# Patient Record
Sex: Male | Born: 1945 | Race: Black or African American | Hispanic: No | Marital: Married | Smoking: Former smoker
Health system: Southern US, Community
[De-identification: ages and names within clinical notes are randomized; demographics above are authoritative.]

## PROBLEM LIST (undated history)

## (undated) DIAGNOSIS — I10 Essential (primary) hypertension: Secondary | ICD-10-CM

## (undated) DIAGNOSIS — E785 Hyperlipidemia, unspecified: Secondary | ICD-10-CM

## (undated) DIAGNOSIS — Z9889 Other specified postprocedural states: Secondary | ICD-10-CM

## (undated) DIAGNOSIS — Z9989 Dependence on other enabling machines and devices: Secondary | ICD-10-CM

## (undated) DIAGNOSIS — I4891 Unspecified atrial fibrillation: Secondary | ICD-10-CM

## (undated) DIAGNOSIS — J939 Pneumothorax, unspecified: Secondary | ICD-10-CM

## (undated) DIAGNOSIS — I472 Ventricular tachycardia, unspecified: Secondary | ICD-10-CM

## (undated) DIAGNOSIS — R413 Other amnesia: Secondary | ICD-10-CM

## (undated) DIAGNOSIS — I509 Heart failure, unspecified: Secondary | ICD-10-CM

## (undated) DIAGNOSIS — F32A Depression, unspecified: Secondary | ICD-10-CM

## (undated) DIAGNOSIS — R569 Unspecified convulsions: Secondary | ICD-10-CM

## (undated) DIAGNOSIS — E119 Type 2 diabetes mellitus without complications: Secondary | ICD-10-CM

## (undated) DIAGNOSIS — Z95810 Presence of automatic (implantable) cardiac defibrillator: Secondary | ICD-10-CM

## (undated) DIAGNOSIS — J189 Pneumonia, unspecified organism: Secondary | ICD-10-CM

## (undated) DIAGNOSIS — K219 Gastro-esophageal reflux disease without esophagitis: Secondary | ICD-10-CM

## (undated) DIAGNOSIS — M1990 Unspecified osteoarthritis, unspecified site: Secondary | ICD-10-CM

## (undated) DIAGNOSIS — J449 Chronic obstructive pulmonary disease, unspecified: Secondary | ICD-10-CM

## (undated) DIAGNOSIS — I639 Cerebral infarction, unspecified: Secondary | ICD-10-CM

## (undated) DIAGNOSIS — Z8719 Personal history of other diseases of the digestive system: Secondary | ICD-10-CM

## (undated) DIAGNOSIS — F329 Major depressive disorder, single episode, unspecified: Secondary | ICD-10-CM

## (undated) DIAGNOSIS — I519 Heart disease, unspecified: Secondary | ICD-10-CM

## (undated) DIAGNOSIS — I219 Acute myocardial infarction, unspecified: Secondary | ICD-10-CM

## (undated) DIAGNOSIS — G4733 Obstructive sleep apnea (adult) (pediatric): Secondary | ICD-10-CM

## (undated) DIAGNOSIS — I255 Ischemic cardiomyopathy: Secondary | ICD-10-CM

## (undated) DIAGNOSIS — N183 Chronic kidney disease, stage 3 (moderate): Secondary | ICD-10-CM

## (undated) HISTORY — DX: Chronic obstructive pulmonary disease, unspecified: J44.9

## (undated) HISTORY — DX: Chronic kidney disease, stage 3 (moderate): N18.3

## (undated) HISTORY — DX: Ischemic cardiomyopathy: I25.5

## (undated) HISTORY — DX: Unspecified atrial fibrillation: I48.91

## (undated) HISTORY — PX: OTHER SURGICAL HISTORY: SHX169

## (undated) HISTORY — DX: Heart failure, unspecified: I50.9

## (undated) HISTORY — DX: Gastro-esophageal reflux disease without esophagitis: K21.9

## (undated) HISTORY — DX: Ventricular tachycardia: I47.2

## (undated) HISTORY — DX: Hyperlipidemia, unspecified: E78.5

## (undated) HISTORY — DX: Heart disease, unspecified: I51.9

## (undated) HISTORY — DX: Type 2 diabetes mellitus without complications: E11.9

## (undated) HISTORY — DX: Acute myocardial infarction, unspecified: I21.9

## (undated) HISTORY — DX: Ventricular tachycardia, unspecified: I47.20

## (undated) HISTORY — DX: Other specified postprocedural states: Z98.890

## (undated) HISTORY — DX: Major depressive disorder, single episode, unspecified: F32.9

## (undated) HISTORY — DX: Depression, unspecified: F32.A

## (undated) HISTORY — DX: Other amnesia: R41.3

## (undated) HISTORY — DX: Personal history of other diseases of the digestive system: Z87.19

## (undated) HISTORY — DX: Essential (primary) hypertension: I10

## (undated) HISTORY — PX: ABDOMINAL HERNIA REPAIR: SHX539

## (undated) HISTORY — DX: Pneumothorax, unspecified: J93.9

## (undated) HISTORY — PX: HERNIA REPAIR: SHX51

## (undated) HISTORY — DX: Unspecified osteoarthritis, unspecified site: M19.90

## (undated) HISTORY — DX: Cerebral infarction, unspecified: I63.9

## (undated) HISTORY — DX: Unspecified convulsions: R56.9

## (undated) HISTORY — PX: HAND TENDON SURGERY: SHX663

---

## 1984-05-22 DIAGNOSIS — I219 Acute myocardial infarction, unspecified: Secondary | ICD-10-CM

## 1984-05-22 HISTORY — DX: Acute myocardial infarction, unspecified: I21.9

## 1985-01-03 HISTORY — PX: CORONARY ANGIOPLASTY: SHX604

## 1994-07-11 ENCOUNTER — Encounter: Payer: Self-pay | Admitting: Internal Medicine

## 2001-06-21 ENCOUNTER — Encounter: Admission: RE | Admit: 2001-06-21 | Discharge: 2001-09-19 | Payer: Self-pay | Admitting: Anesthesiology

## 2002-12-05 ENCOUNTER — Encounter: Payer: Self-pay | Admitting: Internal Medicine

## 2003-03-09 ENCOUNTER — Ambulatory Visit (HOSPITAL_COMMUNITY): Admission: RE | Admit: 2003-03-09 | Discharge: 2003-03-09 | Payer: Self-pay | Admitting: Gastroenterology

## 2003-03-09 ENCOUNTER — Encounter: Payer: Self-pay | Admitting: Gastroenterology

## 2003-05-20 ENCOUNTER — Ambulatory Visit (HOSPITAL_COMMUNITY): Admission: RE | Admit: 2003-05-20 | Discharge: 2003-05-20 | Payer: Self-pay | Admitting: Gastroenterology

## 2004-02-11 ENCOUNTER — Inpatient Hospital Stay (HOSPITAL_COMMUNITY): Admission: RE | Admit: 2004-02-11 | Discharge: 2004-02-12 | Payer: Self-pay | Admitting: Internal Medicine

## 2004-02-12 ENCOUNTER — Encounter: Payer: Self-pay | Admitting: Internal Medicine

## 2004-03-20 ENCOUNTER — Encounter: Payer: Self-pay | Admitting: Critical Care Medicine

## 2004-04-06 ENCOUNTER — Ambulatory Visit: Payer: Self-pay

## 2004-04-08 ENCOUNTER — Ambulatory Visit: Payer: Self-pay | Admitting: Critical Care Medicine

## 2004-06-01 ENCOUNTER — Ambulatory Visit: Payer: Self-pay | Admitting: Internal Medicine

## 2004-10-24 ENCOUNTER — Ambulatory Visit: Payer: Self-pay | Admitting: Internal Medicine

## 2004-10-26 ENCOUNTER — Encounter: Payer: Self-pay | Admitting: Internal Medicine

## 2005-08-24 ENCOUNTER — Encounter: Payer: Self-pay | Admitting: Pulmonary Disease

## 2006-04-05 DIAGNOSIS — I10 Essential (primary) hypertension: Secondary | ICD-10-CM

## 2006-04-05 DIAGNOSIS — I69998 Other sequelae following unspecified cerebrovascular disease: Secondary | ICD-10-CM | POA: Insufficient documentation

## 2006-04-05 DIAGNOSIS — I2510 Atherosclerotic heart disease of native coronary artery without angina pectoris: Secondary | ICD-10-CM

## 2006-04-05 DIAGNOSIS — R519 Headache, unspecified: Secondary | ICD-10-CM | POA: Insufficient documentation

## 2006-04-05 DIAGNOSIS — G4733 Obstructive sleep apnea (adult) (pediatric): Secondary | ICD-10-CM | POA: Insufficient documentation

## 2006-04-05 DIAGNOSIS — E782 Mixed hyperlipidemia: Secondary | ICD-10-CM | POA: Insufficient documentation

## 2006-05-22 DIAGNOSIS — J189 Pneumonia, unspecified organism: Secondary | ICD-10-CM

## 2006-05-22 HISTORY — DX: Pneumonia, unspecified organism: J18.9

## 2006-10-19 ENCOUNTER — Encounter: Payer: Self-pay | Admitting: Internal Medicine

## 2006-12-11 ENCOUNTER — Encounter: Payer: Self-pay | Admitting: Internal Medicine

## 2006-12-19 ENCOUNTER — Encounter: Payer: Self-pay | Admitting: Internal Medicine

## 2007-01-24 ENCOUNTER — Ambulatory Visit: Payer: Self-pay | Admitting: Pulmonary Disease

## 2007-04-12 ENCOUNTER — Ambulatory Visit: Payer: Self-pay | Admitting: Pulmonary Disease

## 2007-04-12 DIAGNOSIS — I5022 Chronic systolic (congestive) heart failure: Secondary | ICD-10-CM | POA: Insufficient documentation

## 2007-05-23 DIAGNOSIS — I639 Cerebral infarction, unspecified: Secondary | ICD-10-CM

## 2007-05-23 HISTORY — DX: Cerebral infarction, unspecified: I63.9

## 2007-07-16 ENCOUNTER — Encounter: Payer: Self-pay | Admitting: Internal Medicine

## 2007-07-19 ENCOUNTER — Ambulatory Visit: Payer: Self-pay | Admitting: *Deleted

## 2007-07-19 ENCOUNTER — Ambulatory Visit: Payer: Self-pay | Admitting: Internal Medicine

## 2007-07-19 ENCOUNTER — Inpatient Hospital Stay (HOSPITAL_COMMUNITY): Admission: EM | Admit: 2007-07-19 | Discharge: 2007-07-21 | Payer: Self-pay | Admitting: Emergency Medicine

## 2007-07-29 ENCOUNTER — Ambulatory Visit: Payer: Self-pay | Admitting: *Deleted

## 2007-07-29 DIAGNOSIS — H9190 Unspecified hearing loss, unspecified ear: Secondary | ICD-10-CM | POA: Insufficient documentation

## 2007-07-30 ENCOUNTER — Encounter: Payer: Self-pay | Admitting: Internal Medicine

## 2007-09-06 ENCOUNTER — Ambulatory Visit: Payer: Self-pay | Admitting: Internal Medicine

## 2007-09-06 ENCOUNTER — Ambulatory Visit: Payer: Self-pay | Admitting: *Deleted

## 2007-09-06 ENCOUNTER — Inpatient Hospital Stay (HOSPITAL_COMMUNITY): Admission: AD | Admit: 2007-09-06 | Discharge: 2007-09-09 | Payer: Self-pay | Admitting: *Deleted

## 2007-09-06 DIAGNOSIS — I6350 Cerebral infarction due to unspecified occlusion or stenosis of unspecified cerebral artery: Secondary | ICD-10-CM | POA: Insufficient documentation

## 2007-09-13 ENCOUNTER — Encounter: Payer: Self-pay | Admitting: Critical Care Medicine

## 2007-09-16 ENCOUNTER — Telehealth (INDEPENDENT_AMBULATORY_CARE_PROVIDER_SITE_OTHER): Payer: Self-pay | Admitting: Internal Medicine

## 2007-09-18 ENCOUNTER — Ambulatory Visit: Payer: Self-pay | Admitting: Internal Medicine

## 2007-09-26 ENCOUNTER — Encounter (INDEPENDENT_AMBULATORY_CARE_PROVIDER_SITE_OTHER): Payer: Self-pay | Admitting: *Deleted

## 2007-10-02 ENCOUNTER — Telehealth: Payer: Self-pay | Admitting: *Deleted

## 2007-10-03 ENCOUNTER — Encounter: Payer: Self-pay | Admitting: Internal Medicine

## 2007-10-04 ENCOUNTER — Encounter: Payer: Self-pay | Admitting: Infectious Disease

## 2007-10-07 ENCOUNTER — Encounter: Payer: Self-pay | Admitting: Internal Medicine

## 2007-10-08 ENCOUNTER — Encounter: Payer: Self-pay | Admitting: Internal Medicine

## 2007-10-09 ENCOUNTER — Ambulatory Visit (HOSPITAL_COMMUNITY): Admission: RE | Admit: 2007-10-09 | Discharge: 2007-10-09 | Payer: Self-pay | Admitting: Internal Medicine

## 2007-10-21 ENCOUNTER — Telehealth: Payer: Self-pay | Admitting: Internal Medicine

## 2007-10-23 ENCOUNTER — Ambulatory Visit: Payer: Self-pay | Admitting: *Deleted

## 2007-10-23 ENCOUNTER — Ambulatory Visit (HOSPITAL_COMMUNITY): Admission: RE | Admit: 2007-10-23 | Discharge: 2007-10-23 | Payer: Self-pay | Admitting: *Deleted

## 2007-10-23 DIAGNOSIS — I4891 Unspecified atrial fibrillation: Secondary | ICD-10-CM | POA: Insufficient documentation

## 2007-10-23 DIAGNOSIS — M25519 Pain in unspecified shoulder: Secondary | ICD-10-CM | POA: Insufficient documentation

## 2007-10-28 ENCOUNTER — Encounter: Payer: Self-pay | Admitting: Internal Medicine

## 2007-11-06 ENCOUNTER — Encounter: Payer: Self-pay | Admitting: Pulmonary Disease

## 2007-11-06 ENCOUNTER — Encounter: Payer: Self-pay | Admitting: Internal Medicine

## 2007-11-07 ENCOUNTER — Encounter: Payer: Self-pay | Admitting: Internal Medicine

## 2007-11-08 ENCOUNTER — Telehealth: Payer: Self-pay | Admitting: Internal Medicine

## 2007-11-12 ENCOUNTER — Encounter: Payer: Self-pay | Admitting: Internal Medicine

## 2007-11-14 ENCOUNTER — Telehealth: Payer: Self-pay | Admitting: Internal Medicine

## 2007-11-14 ENCOUNTER — Encounter (INDEPENDENT_AMBULATORY_CARE_PROVIDER_SITE_OTHER): Payer: Self-pay | Admitting: Internal Medicine

## 2007-11-15 ENCOUNTER — Encounter: Payer: Self-pay | Admitting: Internal Medicine

## 2007-11-20 ENCOUNTER — Encounter: Payer: Self-pay | Admitting: Internal Medicine

## 2007-11-25 ENCOUNTER — Encounter: Payer: Self-pay | Admitting: Internal Medicine

## 2007-12-06 ENCOUNTER — Encounter: Payer: Self-pay | Admitting: Internal Medicine

## 2007-12-06 ENCOUNTER — Ambulatory Visit: Payer: Self-pay | Admitting: *Deleted

## 2007-12-09 LAB — CONVERTED CEMR LAB
BUN: 21 mg/dL (ref 6–23)
CO2: 26 meq/L (ref 19–32)
Calcium: 10.3 mg/dL (ref 8.4–10.5)
Chloride: 105 meq/L (ref 96–112)
Glucose, Bld: 75 mg/dL (ref 70–99)

## 2007-12-11 ENCOUNTER — Encounter: Payer: Self-pay | Admitting: Internal Medicine

## 2007-12-26 ENCOUNTER — Encounter: Payer: Self-pay | Admitting: Internal Medicine

## 2008-01-02 ENCOUNTER — Encounter: Payer: Self-pay | Admitting: Internal Medicine

## 2008-01-15 ENCOUNTER — Encounter: Payer: Self-pay | Admitting: Internal Medicine

## 2008-02-07 ENCOUNTER — Telehealth: Payer: Self-pay | Admitting: Internal Medicine

## 2008-02-24 ENCOUNTER — Ambulatory Visit: Payer: Self-pay | Admitting: Internal Medicine

## 2008-04-09 ENCOUNTER — Encounter: Payer: Self-pay | Admitting: Internal Medicine

## 2008-04-27 ENCOUNTER — Encounter: Payer: Self-pay | Admitting: Internal Medicine

## 2008-05-07 ENCOUNTER — Encounter: Payer: Self-pay | Admitting: Internal Medicine

## 2008-07-01 ENCOUNTER — Ambulatory Visit (HOSPITAL_COMMUNITY): Admission: RE | Admit: 2008-07-01 | Discharge: 2008-07-01 | Payer: Self-pay | Admitting: Internal Medicine

## 2008-07-01 ENCOUNTER — Encounter (INDEPENDENT_AMBULATORY_CARE_PROVIDER_SITE_OTHER): Payer: Self-pay | Admitting: Internal Medicine

## 2008-07-01 ENCOUNTER — Encounter: Payer: Self-pay | Admitting: Internal Medicine

## 2008-07-01 ENCOUNTER — Ambulatory Visit: Payer: Self-pay | Admitting: Internal Medicine

## 2008-07-01 LAB — CONVERTED CEMR LAB
Alkaline Phosphatase: 52 units/L (ref 39–117)
Basophils Absolute: 0.1 10*3/uL (ref 0.0–0.1)
Calcium: 9.8 mg/dL (ref 8.4–10.5)
Creatinine, Ser: 1.58 mg/dL — ABNORMAL HIGH (ref 0.40–1.50)
Eosinophils Absolute: 0.6 10*3/uL (ref 0.0–0.7)
Eosinophils Relative: 8 % — ABNORMAL HIGH (ref 0–5)
Glucose, Bld: 94 mg/dL (ref 70–99)
HCT: 44 % (ref 39.0–52.0)
Hemoglobin: 15.4 g/dL (ref 13.0–17.0)
Lymphocytes Relative: 21 % (ref 12–46)
MCV: 88.8 fL (ref 78.0–100.0)
Monocytes Relative: 13 % — ABNORMAL HIGH (ref 3–12)
Potassium: 3.7 meq/L (ref 3.5–5.3)
RBC: 4.96 M/uL (ref 4.22–5.81)
RDW: 14.3 % (ref 11.5–15.5)
Total Bilirubin: 0.8 mg/dL (ref 0.3–1.2)
Total Protein: 7.6 g/dL (ref 6.0–8.3)
WBC: 7.4 10*3/uL (ref 4.0–10.5)

## 2008-07-20 ENCOUNTER — Telehealth: Payer: Self-pay | Admitting: *Deleted

## 2008-07-23 ENCOUNTER — Encounter: Payer: Self-pay | Admitting: Internal Medicine

## 2008-07-29 ENCOUNTER — Encounter: Payer: Self-pay | Admitting: Internal Medicine

## 2008-07-30 ENCOUNTER — Encounter: Payer: Self-pay | Admitting: Internal Medicine

## 2008-08-03 ENCOUNTER — Telehealth: Payer: Self-pay | Admitting: Internal Medicine

## 2008-08-04 ENCOUNTER — Telehealth: Payer: Self-pay | Admitting: Gastroenterology

## 2008-08-04 ENCOUNTER — Telehealth: Payer: Self-pay | Admitting: Internal Medicine

## 2008-08-05 ENCOUNTER — Telehealth: Payer: Self-pay | Admitting: Internal Medicine

## 2008-08-24 ENCOUNTER — Encounter: Payer: Self-pay | Admitting: Internal Medicine

## 2008-08-27 ENCOUNTER — Ambulatory Visit: Payer: Self-pay | Admitting: Internal Medicine

## 2008-08-27 ENCOUNTER — Encounter: Payer: Self-pay | Admitting: Licensed Clinical Social Worker

## 2008-09-01 ENCOUNTER — Encounter: Payer: Self-pay | Admitting: Internal Medicine

## 2008-09-18 ENCOUNTER — Encounter: Payer: Self-pay | Admitting: Licensed Clinical Social Worker

## 2008-09-18 ENCOUNTER — Ambulatory Visit: Payer: Self-pay | Admitting: Infectious Disease

## 2008-09-29 ENCOUNTER — Encounter: Payer: Self-pay | Admitting: Internal Medicine

## 2008-10-08 ENCOUNTER — Telehealth: Payer: Self-pay | Admitting: Licensed Clinical Social Worker

## 2008-10-12 ENCOUNTER — Encounter: Payer: Self-pay | Admitting: Internal Medicine

## 2008-10-26 ENCOUNTER — Encounter: Payer: Self-pay | Admitting: Internal Medicine

## 2008-12-29 ENCOUNTER — Ambulatory Visit: Payer: Self-pay | Admitting: Internal Medicine

## 2008-12-29 LAB — CONVERTED CEMR LAB

## 2008-12-30 ENCOUNTER — Encounter: Payer: Self-pay | Admitting: Internal Medicine

## 2008-12-31 ENCOUNTER — Encounter: Payer: Self-pay | Admitting: Internal Medicine

## 2009-01-04 ENCOUNTER — Encounter: Payer: Self-pay | Admitting: Internal Medicine

## 2009-01-05 ENCOUNTER — Encounter: Payer: Self-pay | Admitting: Internal Medicine

## 2009-01-06 ENCOUNTER — Encounter: Payer: Self-pay | Admitting: Internal Medicine

## 2009-01-19 ENCOUNTER — Encounter: Payer: Self-pay | Admitting: Internal Medicine

## 2009-02-17 ENCOUNTER — Encounter: Payer: Self-pay | Admitting: Internal Medicine

## 2009-02-18 ENCOUNTER — Ambulatory Visit (HOSPITAL_COMMUNITY): Admission: RE | Admit: 2009-02-18 | Discharge: 2009-02-18 | Payer: Self-pay | Admitting: Internal Medicine

## 2009-02-18 ENCOUNTER — Ambulatory Visit: Payer: Self-pay | Admitting: Internal Medicine

## 2009-02-18 LAB — CONVERTED CEMR LAB
Albumin: 4.6 g/dL (ref 3.5–5.2)
Basophils Relative: 0 % (ref 0–1)
Chloride: 105 meq/L (ref 96–112)
Eosinophils Absolute: 0.3 10*3/uL (ref 0.0–0.7)
Hemoglobin: 14.7 g/dL (ref 13.0–17.0)
Lymphs Abs: 3.3 10*3/uL (ref 0.7–4.0)
Platelets: 300 10*3/uL (ref 150–400)
Potassium: 4 meq/L (ref 3.5–5.3)
TSH: 2.65 microintl units/mL (ref 0.350–4.5)
Total Bilirubin: 0.6 mg/dL (ref 0.3–1.2)
Total Protein: 7.5 g/dL (ref 6.0–8.3)
Vitamin B-12: 457 pg/mL (ref 211–911)
WBC: 7.6 10*3/uL (ref 4.0–10.5)

## 2009-02-22 ENCOUNTER — Ambulatory Visit: Payer: Self-pay | Admitting: Internal Medicine

## 2009-02-22 LAB — CONVERTED CEMR LAB: INR: 2.3

## 2009-03-01 ENCOUNTER — Encounter: Payer: Self-pay | Admitting: Internal Medicine

## 2009-03-02 ENCOUNTER — Ambulatory Visit: Payer: Self-pay | Admitting: Internal Medicine

## 2009-03-09 ENCOUNTER — Telehealth: Payer: Self-pay | Admitting: Internal Medicine

## 2009-03-09 ENCOUNTER — Ambulatory Visit: Payer: Self-pay | Admitting: Internal Medicine

## 2009-03-11 ENCOUNTER — Ambulatory Visit: Payer: Self-pay | Admitting: Pulmonary Disease

## 2009-03-11 ENCOUNTER — Ambulatory Visit (HOSPITAL_COMMUNITY): Admission: RE | Admit: 2009-03-11 | Discharge: 2009-03-11 | Payer: Self-pay | Admitting: Internal Medicine

## 2009-03-11 ENCOUNTER — Ambulatory Visit: Payer: Self-pay | Admitting: Internal Medicine

## 2009-03-12 ENCOUNTER — Encounter: Payer: Self-pay | Admitting: Internal Medicine

## 2009-03-12 LAB — CONVERTED CEMR LAB: Sed Rate: 2 mm/hr (ref 0–16)

## 2009-03-16 ENCOUNTER — Telehealth: Payer: Self-pay | Admitting: Pulmonary Disease

## 2009-03-22 ENCOUNTER — Telehealth: Payer: Self-pay | Admitting: Internal Medicine

## 2009-03-22 ENCOUNTER — Encounter: Admission: RE | Admit: 2009-03-22 | Discharge: 2009-04-20 | Payer: Self-pay | Admitting: Internal Medicine

## 2009-03-23 ENCOUNTER — Encounter: Payer: Self-pay | Admitting: Internal Medicine

## 2009-03-23 ENCOUNTER — Telehealth (INDEPENDENT_AMBULATORY_CARE_PROVIDER_SITE_OTHER): Payer: Self-pay | Admitting: *Deleted

## 2009-03-24 ENCOUNTER — Encounter: Payer: Self-pay | Admitting: Internal Medicine

## 2009-03-26 ENCOUNTER — Encounter
Admission: RE | Admit: 2009-03-26 | Discharge: 2009-05-13 | Payer: Self-pay | Admitting: Physical Medicine & Rehabilitation

## 2009-03-29 ENCOUNTER — Encounter: Payer: Self-pay | Admitting: Internal Medicine

## 2009-03-30 ENCOUNTER — Telehealth: Payer: Self-pay | Admitting: Internal Medicine

## 2009-03-30 ENCOUNTER — Encounter (INDEPENDENT_AMBULATORY_CARE_PROVIDER_SITE_OTHER): Payer: Self-pay | Admitting: Internal Medicine

## 2009-03-31 ENCOUNTER — Encounter: Payer: Self-pay | Admitting: Internal Medicine

## 2009-04-02 ENCOUNTER — Telehealth (INDEPENDENT_AMBULATORY_CARE_PROVIDER_SITE_OTHER): Payer: Self-pay | Admitting: *Deleted

## 2009-04-02 ENCOUNTER — Telehealth: Payer: Self-pay | Admitting: Pulmonary Disease

## 2009-04-13 ENCOUNTER — Encounter: Payer: Self-pay | Admitting: Internal Medicine

## 2009-04-20 ENCOUNTER — Encounter: Payer: Self-pay | Admitting: Internal Medicine

## 2009-04-20 ENCOUNTER — Telehealth (INDEPENDENT_AMBULATORY_CARE_PROVIDER_SITE_OTHER): Payer: Self-pay | Admitting: *Deleted

## 2009-04-22 ENCOUNTER — Ambulatory Visit (HOSPITAL_COMMUNITY): Admission: RE | Admit: 2009-04-22 | Discharge: 2009-04-22 | Payer: Self-pay | Admitting: Internal Medicine

## 2009-04-22 ENCOUNTER — Ambulatory Visit: Payer: Self-pay | Admitting: Internal Medicine

## 2009-04-22 ENCOUNTER — Encounter: Payer: Self-pay | Admitting: Internal Medicine

## 2009-04-22 LAB — CONVERTED CEMR LAB
Basophils Absolute: 0 10*3/uL (ref 0.0–0.1)
Basophils Relative: 1 % (ref 0–1)
Eosinophils Absolute: 0.4 10*3/uL (ref 0.0–0.7)
Eosinophils Relative: 5 % (ref 0–5)
HCT: 41.6 % (ref 39.0–52.0)
Hemoglobin: 14.1 g/dL (ref 13.0–17.0)
Lymphocytes Relative: 31 % (ref 12–46)
Lymphs Abs: 2.4 10*3/uL (ref 0.7–4.0)
MCHC: 33.9 g/dL (ref 30.0–36.0)
MCV: 90.3 fL (ref >=78.0)
Monocytes Absolute: 0.8 10*3/uL (ref 0.1–1.0)
Monocytes Relative: 10 % (ref 3–12)
Neutro Abs: 4.1 10*3/uL (ref 1.7–7.7)
Neutrophils Relative %: 54 % (ref 43–77)
Platelets: 287 10*3/uL (ref 150–400)
RBC: 4.61 M/uL (ref 4.22–5.81)
RDW: 13.7 % (ref 11.5–15.5)
Total CK: 281 units/L — ABNORMAL HIGH (ref 7–232)
Troponin I: 0.07 ng/mL — ABNORMAL HIGH (ref <0.06)
WBC: 7.7 10*3/uL (ref 4.0–10.5)

## 2009-05-22 HISTORY — PX: OTHER SURGICAL HISTORY: SHX169

## 2009-06-01 ENCOUNTER — Ambulatory Visit: Payer: Self-pay | Admitting: Internal Medicine

## 2009-06-14 ENCOUNTER — Ambulatory Visit: Payer: Self-pay | Admitting: Internal Medicine

## 2009-06-14 ENCOUNTER — Ambulatory Visit: Payer: Self-pay | Admitting: Cardiology

## 2009-06-14 ENCOUNTER — Telehealth: Payer: Self-pay | Admitting: Internal Medicine

## 2009-06-14 LAB — CONVERTED CEMR LAB: POC INR: 2.1

## 2009-06-16 ENCOUNTER — Telehealth: Payer: Self-pay | Admitting: *Deleted

## 2009-06-21 ENCOUNTER — Telehealth (INDEPENDENT_AMBULATORY_CARE_PROVIDER_SITE_OTHER): Payer: Self-pay

## 2009-06-22 ENCOUNTER — Ambulatory Visit: Payer: Self-pay

## 2009-06-22 ENCOUNTER — Ambulatory Visit: Payer: Self-pay | Admitting: Internal Medicine

## 2009-06-22 ENCOUNTER — Encounter: Payer: Self-pay | Admitting: Internal Medicine

## 2009-06-22 ENCOUNTER — Encounter (HOSPITAL_COMMUNITY): Admission: RE | Admit: 2009-06-22 | Discharge: 2009-08-25 | Payer: Self-pay | Admitting: Internal Medicine

## 2009-06-25 ENCOUNTER — Ambulatory Visit: Payer: Self-pay | Admitting: Internal Medicine

## 2009-07-05 ENCOUNTER — Ambulatory Visit: Payer: Self-pay | Admitting: Cardiology

## 2009-07-05 ENCOUNTER — Encounter (INDEPENDENT_AMBULATORY_CARE_PROVIDER_SITE_OTHER): Payer: Self-pay | Admitting: Cardiology

## 2009-07-05 LAB — CONVERTED CEMR LAB: POC INR: 2.4

## 2009-07-26 ENCOUNTER — Ambulatory Visit: Payer: Self-pay | Admitting: Internal Medicine

## 2009-07-26 LAB — CONVERTED CEMR LAB: POC INR: 1.9

## 2009-08-23 ENCOUNTER — Ambulatory Visit: Payer: Self-pay | Admitting: Internal Medicine

## 2009-08-23 LAB — CONVERTED CEMR LAB: POC INR: 1.8

## 2009-08-26 ENCOUNTER — Telehealth: Payer: Self-pay | Admitting: *Deleted

## 2009-08-27 LAB — CONVERTED CEMR LAB
BUN: 19 mg/dL (ref 6–23)
CO2: 32 meq/L (ref 19–32)
GFR calc non Af Amer: 46.27 mL/min (ref >60)
Glucose, Bld: 99 mg/dL (ref 70–99)
Sodium: 140 meq/L (ref 135–145)

## 2009-09-06 ENCOUNTER — Ambulatory Visit: Payer: Self-pay | Admitting: Cardiology

## 2009-09-06 LAB — CONVERTED CEMR LAB: POC INR: 1.2

## 2009-09-16 ENCOUNTER — Ambulatory Visit: Payer: Self-pay | Admitting: Cardiology

## 2009-09-24 ENCOUNTER — Ambulatory Visit: Payer: Self-pay | Admitting: Internal Medicine

## 2009-09-24 LAB — CONVERTED CEMR LAB: POC INR: 3.2

## 2009-10-07 ENCOUNTER — Telehealth: Payer: Self-pay | Admitting: Internal Medicine

## 2009-10-19 ENCOUNTER — Telehealth (INDEPENDENT_AMBULATORY_CARE_PROVIDER_SITE_OTHER): Payer: Self-pay | Admitting: *Deleted

## 2009-10-22 ENCOUNTER — Ambulatory Visit: Payer: Self-pay | Admitting: Cardiovascular Disease

## 2009-11-02 ENCOUNTER — Telehealth: Payer: Self-pay | Admitting: Internal Medicine

## 2009-11-03 ENCOUNTER — Telehealth: Payer: Self-pay | Admitting: Internal Medicine

## 2009-11-15 ENCOUNTER — Ambulatory Visit: Payer: Self-pay | Admitting: Internal Medicine

## 2009-11-15 ENCOUNTER — Ambulatory Visit: Payer: Self-pay | Admitting: Cardiology

## 2009-11-17 LAB — CONVERTED CEMR LAB
Basophils Relative: 0.7 % (ref 0.0–3.0)
Lymphocytes Relative: 32.6 % (ref 12.0–46.0)
Monocytes Relative: 9.4 % (ref 3.0–12.0)
Neutro Abs: 3.4 10*3/uL (ref 1.4–7.7)
Platelets: 282 10*3/uL (ref 150.0–400.0)
RBC: 4.38 M/uL (ref 4.22–5.81)
WBC: 6.5 10*3/uL (ref 4.5–10.5)

## 2009-11-26 ENCOUNTER — Encounter: Payer: Self-pay | Admitting: Internal Medicine

## 2009-12-02 ENCOUNTER — Ambulatory Visit: Payer: Self-pay | Admitting: Internal Medicine

## 2009-12-08 ENCOUNTER — Encounter: Payer: Self-pay | Admitting: Internal Medicine

## 2009-12-09 ENCOUNTER — Ambulatory Visit: Payer: Self-pay | Admitting: Internal Medicine

## 2009-12-09 ENCOUNTER — Inpatient Hospital Stay (HOSPITAL_COMMUNITY): Admission: EM | Admit: 2009-12-09 | Discharge: 2009-12-13 | Payer: Self-pay | Admitting: Emergency Medicine

## 2009-12-13 ENCOUNTER — Telehealth (INDEPENDENT_AMBULATORY_CARE_PROVIDER_SITE_OTHER): Payer: Self-pay | Admitting: *Deleted

## 2009-12-21 ENCOUNTER — Telehealth: Payer: Self-pay | Admitting: Internal Medicine

## 2009-12-23 ENCOUNTER — Ambulatory Visit: Payer: Self-pay | Admitting: Internal Medicine

## 2009-12-23 DIAGNOSIS — E785 Hyperlipidemia, unspecified: Secondary | ICD-10-CM

## 2009-12-23 DIAGNOSIS — I472 Ventricular tachycardia, unspecified: Secondary | ICD-10-CM

## 2009-12-24 ENCOUNTER — Telehealth: Payer: Self-pay | Admitting: Internal Medicine

## 2009-12-24 LAB — CONVERTED CEMR LAB
ALT: 31 units/L (ref 0–53)
AST: 27 units/L (ref 0–37)
Albumin: 4.5 g/dL (ref 3.5–5.2)
Alkaline Phosphatase: 51 units/L (ref 39–117)
BUN: 11 mg/dL (ref 6–23)
CO2: 25 meq/L (ref 19–32)
Calcium: 10.1 mg/dL (ref 8.4–10.5)
Chloride: 103 meq/L (ref 96–112)
Cholesterol: 156 mg/dL (ref 0–200)
Creatinine, Ser: 1.73 mg/dL — ABNORMAL HIGH (ref 0.40–1.50)
Glucose, Bld: 117 mg/dL — ABNORMAL HIGH (ref 70–99)
HDL: 31 mg/dL — ABNORMAL LOW (ref >39)
LDL Cholesterol: 103 mg/dL — ABNORMAL HIGH (ref 0–99)
Potassium: 4.6 meq/L (ref 3.5–5.3)
Sodium: 138 meq/L (ref 135–145)
Total Bilirubin: 0.4 mg/dL (ref 0.3–1.2)
Total CHOL/HDL Ratio: 5
Total Protein: 7 g/dL (ref 6.0–8.3)
Triglycerides: 111 mg/dL (ref <150)
VLDL: 22 mg/dL (ref 0–40)

## 2009-12-28 ENCOUNTER — Ambulatory Visit: Payer: Self-pay | Admitting: Internal Medicine

## 2009-12-29 ENCOUNTER — Encounter: Payer: Self-pay | Admitting: Internal Medicine

## 2010-01-06 ENCOUNTER — Telehealth (INDEPENDENT_AMBULATORY_CARE_PROVIDER_SITE_OTHER): Payer: Self-pay | Admitting: *Deleted

## 2010-01-10 ENCOUNTER — Encounter (HOSPITAL_COMMUNITY): Admission: RE | Admit: 2010-01-10 | Discharge: 2010-02-09 | Payer: Self-pay | Admitting: Internal Medicine

## 2010-01-10 ENCOUNTER — Encounter: Payer: Self-pay | Admitting: Internal Medicine

## 2010-01-10 ENCOUNTER — Ambulatory Visit: Payer: Self-pay | Admitting: Internal Medicine

## 2010-01-10 ENCOUNTER — Ambulatory Visit: Payer: Self-pay

## 2010-01-10 LAB — CONVERTED CEMR LAB
ALT: 34 units/L (ref 0–53)
AST: 34 units/L (ref 0–37)
Alkaline Phosphatase: 53 units/L (ref 39–117)
TSH: 1.66 microintl units/mL (ref 0.35–5.50)
Total Bilirubin: 0.5 mg/dL (ref 0.3–1.2)
Total Protein: 7 g/dL (ref 6.0–8.3)

## 2010-01-12 ENCOUNTER — Encounter: Payer: Self-pay | Admitting: Internal Medicine

## 2010-01-21 ENCOUNTER — Telehealth (INDEPENDENT_AMBULATORY_CARE_PROVIDER_SITE_OTHER): Payer: Self-pay | Admitting: *Deleted

## 2010-02-07 ENCOUNTER — Telehealth: Payer: Self-pay | Admitting: *Deleted

## 2010-02-18 ENCOUNTER — Ambulatory Visit: Payer: Self-pay | Admitting: Internal Medicine

## 2010-03-01 ENCOUNTER — Telehealth: Payer: Self-pay | Admitting: Internal Medicine

## 2010-03-21 ENCOUNTER — Telehealth: Payer: Self-pay | Admitting: Internal Medicine

## 2010-04-27 ENCOUNTER — Encounter: Payer: Self-pay | Admitting: Internal Medicine

## 2010-04-27 ENCOUNTER — Ambulatory Visit: Payer: Self-pay | Admitting: Internal Medicine

## 2010-04-27 LAB — CONVERTED CEMR LAB
AST: 30 units/L (ref 0–37)
BUN: 14 mg/dL (ref 6–23)
Basophils Relative: 0.7 % (ref 0.0–3.0)
Calcium: 9.4 mg/dL (ref 8.4–10.5)
Eosinophils Relative: 5.1 % — ABNORMAL HIGH (ref 0.0–5.0)
Free T4: 0.92 ng/dL (ref 0.60–1.60)
GFR calc non Af Amer: 47.03 mL/min — ABNORMAL LOW (ref >60.00)
Hemoglobin: 13.7 g/dL (ref 13.0–17.0)
Lymphocytes Relative: 35.8 % (ref 12.0–46.0)
MCHC: 34.1 g/dL (ref 30.0–36.0)
Monocytes Relative: 11.8 % (ref 3.0–12.0)
Neutro Abs: 3.3 10*3/uL (ref 1.4–7.7)
Neutrophils Relative %: 46.6 % (ref 43.0–77.0)
Potassium: 4.3 meq/L (ref 3.5–5.1)
RBC: 4.39 M/uL (ref 4.22–5.81)
Sodium: 143 meq/L (ref 135–145)
Total Bilirubin: 0.3 mg/dL (ref 0.3–1.2)
WBC: 7.1 10*3/uL (ref 4.5–10.5)

## 2010-05-26 ENCOUNTER — Telehealth: Payer: Self-pay | Admitting: Internal Medicine

## 2010-06-08 ENCOUNTER — Ambulatory Visit: Admission: RE | Admit: 2010-06-08 | Discharge: 2010-06-08 | Payer: Self-pay | Source: Home / Self Care

## 2010-06-14 ENCOUNTER — Encounter: Payer: Self-pay | Admitting: Gastroenterology

## 2010-06-15 ENCOUNTER — Ambulatory Visit
Admission: RE | Admit: 2010-06-15 | Discharge: 2010-06-15 | Payer: Self-pay | Source: Home / Self Care | Attending: Internal Medicine | Admitting: Internal Medicine

## 2010-06-15 LAB — CONVERTED CEMR LAB
OCCULT 2: NEGATIVE
OCCULT 3: NEGATIVE

## 2010-06-21 NOTE — Letter (Signed)
Summary: [facility] Office Note   [facility] Office Note   Imported By: Roderic Ovens 07/21/2009 14:37:08  _____________________________________________________________________  External Attachment:    Type:   Image     Comment:   External Document

## 2010-06-21 NOTE — Progress Notes (Signed)
Summary: Nuc. Pre-Procedure  Phone Note Outgoing Call Call back at [facility] Phone [phone]   Call placed by: Rea College, CMA,  June 21, 2009 3:30 PM Call placed to: Patient Summary of Call: Left message with information on Myoview Information Sheet (see scanned document for details).      Nuclear Med Background Indications for Stress Test: Evaluation for Ischemia, PTCA Patency        Nuclear Pre-Procedure Height (in): 69

## 2010-06-21 NOTE — Progress Notes (Signed)
Summary: CALLING REGARDING PRADAXA  Phone Note Call from Patient Call back at Home Phone [phone]   Caller: Patient Summary of Call: PT CALLING TO SEE IF OFFICE HAS GAVE AUTHORIZATION  FOR PT TO GET PRADAXA Initial call taken by: Judie Grieve,  December 21, 2009 3:32 PM  Follow-up for Phone Call        I have never recieved prior aurth. form.  wife aware and will call and have one faxed to me tomorrow Dennis Bast, RN, BSN  December 21, 2009 5:09 PM Jewel called and got a prior aurth for 12 months Dennis Bast, RN, BSN  December 23, 2009 1:36 PM
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
## 2010-06-21 NOTE — Progress Notes (Signed)
Summary: Medication  Phone Note Outgoing Call   Call placed by: Angelina Ok RN,  June 16, 2009 12:03 PM Call placed to: Patient Summary of Call: Call to pt at [phone], [phone] No answer unable to leave a message to inform hin that his prescription for Vicodin5/500 has been called to the pharmacy. Prescription for Vicodin 5/500mg  tablets # 60 with no refills was called to the [facility] on [address] per order of Dr. Sunnie Nielsen. Angelina Ok RN  June 16, 2009 12:06 PM  Initial call taken by: Angelina Ok RN,  June 16, 2009 12:05 PM

## 2010-06-21 NOTE — Letter (Signed)
Summary: [facility] Office Note  [facility] Office Note   Imported By: Roderic Ovens 07/21/2009 15:35:44  _____________________________________________________________________  External Attachment:    Type:   Image     Comment:   External Document

## 2010-06-21 NOTE — Medication Information (Signed)
Summary: rov/tm  Anticoagulant Therapy  Managed by: Eda Keys, PharmD Referring MD: Tommie Sams PCP: Hartley Barefoot MD Supervising MD: Tenny Craw MD, Gunnar Fusi Indication 1: Atrial fibrillation Indication 2: Encounter for therapeutic drug monitoring  V58.83 Lab Used: [facility] [employer] Site: [address] INR POC 3.2 INR RANGE 2.0-3.0  Dietary changes: no    Health status changes: no    Bleeding/hemorrhagic complications: no    Recent/future hospitalizations: no    Any changes in medication regimen? no    Recent/future dental: no  Any missed doses?: yes     Details: One dose this past tuesday  Is patient compliant with meds? yes       Allergies: 1)  ! Codeine  Anticoagulation Management History:      The patient is taking warfarin and comes in today for a routine follow up visit.  Positive risk factors for bleeding include history of CVA/TIA and presence of serious comorbidities.  Negative risk factors for bleeding include an age less than 51 years old.  The bleeding index is 'intermediate risk'.  Positive CHADS2 values include History of HTN and Prior Stroke/CVA/TIA.  Negative CHADS2 values include Age > 53 years old.  The start date was 05/22/2006.  His last INR was 2.3.  Anticoagulation responsible provider: Tenny Craw MD, Gunnar Fusi.  INR POC: 3.2.  Cuvette Lot#: [id number].  Exp: 10/2010.    Anticoagulation Management Assessment/Plan:      The patient's current anticoagulation dose is Coumadin 5 mg tabs: Take as directed., Warfarin sodium 2.5 mg tabs: UAD.  The target INR is 2.0-3.0.  The next INR is due 10/04/2009.  Anticoagulation instructions were given to patient.  Results were reviewed/authorized by Eda Keys, PharmD.  He was notified by Eda Keys, PharmD.         Prior Anticoagulation Instructions: INR 3.4 Skip today's dose then resume 2.5mg s everyday except 5mg s on Wednesdays and Fridays. Recheck in one week.   Current Anticoagulation  Instructions: INR 3.2  Start NEW schedule of 5 mg on Wednesday and 2.5 mg all other days.  Return to clinic in 10 days.

## 2010-06-21 NOTE — Medication Information (Signed)
Summary: rov/sak  Anticoagulant Therapy  Managed by: Shelby Dubin, PharmD, BCPS, CPP Referring MD: Tommie Sams PCP: Hartley Barefoot MD Supervising MD: Shirlee Latch MD, Tineshia Becraft Indication 1: Atrial fibrillation Indication 2: Encounter for therapeutic drug monitoring  V58.83 INR POC 2.4 INR RANGE 2.0-3.0  Dietary changes: no    Health status changes: no    Bleeding/hemorrhagic complications: no    Recent/future hospitalizations: no    Any changes in medication regimen? no    Recent/future dental: no  Any missed doses?: no       Is patient compliant with meds? yes       Allergies: 1)  ! Codeine  Anticoagulation Management History:      The patient is taking warfarin and comes in today for a routine follow up visit.  Positive risk factors for bleeding include history of CVA/TIA and presence of serious comorbidities.  Negative risk factors for bleeding include an age less than 50 years old.  The bleeding index is 'intermediate risk'.  Positive CHADS2 values include History of HTN and Prior Stroke/CVA/TIA.  Negative CHADS2 values include Age > 40 years old.  The start date was 05/22/2006.  His last INR was 2.3.  Anticoagulation responsible provider: Shirlee Latch MD, Bucky Grigg.  INR POC: 2.4.  Cuvette Lot#: [id number].  Exp: 08/2010.    Anticoagulation Management Assessment/Plan:      The patient's current anticoagulation dose is Coumadin 5 mg tabs: Take as directed., Warfarin sodium 2.5 mg tabs: UAD.  The target INR is 2.0-3.0.  The next INR is due 08/02/2009.  Anticoagulation instructions were given to patient.  Results were reviewed/authorized by Shelby Dubin, PharmD, BCPS, CPP.  He was notified by Shelby Dubin PharmD, BCPS, CPP.         Prior Anticoagulation Instructions: INR 2.1  Patient had been managed by [facility] in [place].  Continue same dose, 5 mg on Wednesday's and 2.5 mg on all other days of the week.  Current Anticoagulation Instructions: INR 2.4  Continue 2.5 mg daily  except 5 mg Wednesday.  Recheck in 4 weeks.

## 2010-06-21 NOTE — Progress Notes (Signed)
Summary: talk to nurse  Phone Note Call from Patient Call back at Home Phone [phone]   Caller: Spouse Summary of Call: pt wife would like to talk to nurse about changing from coumadin to pradaxa.  Initial call taken by: Edman Circle,  Oct 07, 2009 2:28 PM  Follow-up for Phone Call        spoke w/pts wife they saw comercial for pradaxa and would like to change pt on coumadin for a-fib w/no valve problems will need to make sure it is ok w/Dr Caelan Atchley, and we will give her a call back to let her know, in meantime she is going to check w/medicare on cost and if prior auth is needed Meredith Staggers, RN  Oct 07, 2009 3:01 PM  Follow-up by: Hillis Range, MD,  Oct 11, 2009 2:30 PM  Additional Follow-up for Phone Call Additional follow up Details #1::        Pt would be a reasonable candidate for pradaxa given his paroxysmal nonvalvular afib.  His creatinine clearance is >40.  I would recommend 150mg  two times a day once his INR is less than 2.  I agree that he should check with insurance regarding pricing.  If he decides to start praxaxa, he will need to have his INR drawn after holding coumadin for 48 hours to see when it is safe to start pradaxa. Additional Follow-up by: Hillis Range, MD,  Oct 11, 2009 2:31 PM    Additional Follow-up for Phone Call Additional follow up Details #2::    wife aware and will let me know if they want to change Dennis Bast, RN, BSN  Oct 12, 2009 2:08 PM

## 2010-06-21 NOTE — Medication Information (Signed)
Summary: rov/ewj  Anticoagulant Therapy  Managed by: Bethena Midget, RN, BSN Referring MD: Tommie Sams PCP: Hartley Barefoot MD Supervising MD: Daleen Squibb MD, Maisie Fus Indication 1: Atrial fibrillation Indication 2: Encounter for therapeutic drug monitoring  V58.83 Lab Used: [facility] [place] Site: [address] INR POC 3.4 INR RANGE 2.0-3.0  Dietary changes: yes       Details: Ate less green veggies.   Health status changes: yes       Details: Over weekend had diarrhea and vomiting   Bleeding/hemorrhagic complications: no    Recent/future hospitalizations: yes       Details: HCTZ is on hold   Any changes in medication regimen? no    Recent/future dental: no  Any missed doses?: no       Is patient compliant with meds? yes       Allergies: 1)  ! Codeine  Anticoagulation Management History:      The patient is taking warfarin and comes in today for a routine follow up visit.  Positive risk factors for bleeding include history of CVA/TIA and presence of serious comorbidities.  Negative risk factors for bleeding include an age less than 89 years old.  The bleeding index is 'intermediate risk'.  Positive CHADS2 values include History of HTN and Prior Stroke/CVA/TIA.  Negative CHADS2 values include Age > 80 years old.  The start date was 05/22/2006.  His last INR was 2.3.  Anticoagulation responsible provider: Daleen Squibb MD, Maisie Fus.  INR POC: 3.4.  Cuvette Lot#: [id number].  Exp: 10/2010.    Anticoagulation Management Assessment/Plan:      The patient's current anticoagulation dose is Coumadin 5 mg tabs: Take as directed., Warfarin sodium 2.5 mg tabs: UAD.  The target INR is 2.0-3.0.  The next INR is due 09/23/2009.  Anticoagulation instructions were given to patient.  Results were reviewed/authorized by Bethena Midget, RN, BSN.  He was notified by Bethena Midget, RN, BSN.         Prior Anticoagulation Instructions: INR 1.2  Take 7.5mg  today, then 5mg  tomorrow, then resume same dosage  2.5mg  daily except 5mg  on Mondays and Fridays.  Recheck in 10 days.    Current Anticoagulation Instructions: INR 3.4 Skip today's dose then resume 2.5mg s everyday except 5mg s on Wednesdays and Fridays. Recheck in one week.

## 2010-06-21 NOTE — Medication Information (Signed)
Summary: rov.mp  Anticoagulant Therapy  Managed by: Bethena Midget, RN, BSN Referring MD: Tommie Sams PCP: Hartley Barefoot MD Supervising MD: [name] MD, Sharlot Gowda Indication 1: Atrial fibrillation Indication 2: Encounter for therapeutic drug monitoring  V58.83 Lab Used: [facility] [place] Site: [address] INR POC 1.9 INR RANGE 2.0-3.0  Dietary changes: no    Health status changes: no    Bleeding/hemorrhagic complications: no    Recent/future hospitalizations: no    Any changes in medication regimen? no    Recent/future dental: no  Any missed doses?: yes     Details: missed last Wed and Thurs. dose  Is patient compliant with meds? yes       Current Medications (verified): 1)  Lipitor 80 Mg  Tabs (Atorvastatin Calcium) .... At Bedtime 2)  Nexium 40 Mg  Cpdr (Esomeprazole Magnesium) .... Once Daily 3)  Enalapril Maleate 20 Mg Tabs (Enalapril Maleate) .... Two Times A Day 4)  Metoprolol Succinate 50 Mg Xr24h-Tab (Metoprolol Succinate) .... Take 1 Tablet By Mouth Daily. 5)  Lipofen 150 Mg  Caps (Fenofibrate) .... One Tab By Mouth Daily 6)  Hydrochlorothiazide 25 Mg  Tabs (Hydrochlorothiazide) .Marland Kitchen.. 1 Tablet By Mouth Daily 7)  Hydroxyzine Hcl 50 Mg Tabs (Hydroxyzine Hcl) .... Take One Tablet Three Times A Day For Itching. 8)  Nitro-Dur 0.4 Mg/hr Pt24 (Nitroglycerin) .... Place One Tablet Under Your Tongue For Chest Pain.  Repeat in 5 Minutes If There Is No Effect.  Maximum of 3 Doses. Call Your Doctor If You Have Chest Pain. 9)  Coumadin 5 Mg Tabs (Warfarin Sodium) .... Take As Directed. 10)  Flexeril 5 Mg Tabs (Cyclobenzaprine Hcl) .... Take 1 By Mouth Three Times A Day. 11)  Voltaren 0.1 % Soln (Diclofenac Sodium) .... Apply Affeted Area Three Times A Day. 12)  Ranitidine Hcl 150 Mg Tabs (Ranitidine Hcl) .... Take One Tablet By Mouth Once Daily. 13)  Aspirin 81 Mg Tbec (Aspirin) .... Take One Tablet By Mouth Daily 14)  Hydrochlorothiazide 25 Mg Tabs  (Hydrochlorothiazide) .... Take One Tablet By Mouth Daily. 15)  Warfarin Sodium 2.5 Mg Tabs (Warfarin Sodium) .... Uad  Allergies: 1)  ! Codeine  Anticoagulation Management History:      The patient is taking warfarin and comes in today for a routine follow up visit.  Positive risk factors for bleeding include history of CVA/TIA and presence of serious comorbidities.  Negative risk factors for bleeding include an age less than 69 years old.  The bleeding index is 'intermediate risk'.  Positive CHADS2 values include History of HTN and Prior Stroke/CVA/TIA.  Negative CHADS2 values include Age > 92 years old.  The start date was 05/22/2006.  His last INR was 2.3.  Anticoagulation responsible provider: [name] MD, Sharlot Gowda.  INR POC: 1.9.  Cuvette Lot#: [id number].  Exp: 09/2010.    Anticoagulation Management Assessment/Plan:      The patient's current anticoagulation dose is Coumadin 5 mg tabs: Take as directed., Warfarin sodium 2.5 mg tabs: UAD.  The target INR is 2.0-3.0.  The next INR is due 08/23/2009.  Anticoagulation instructions were given to patient.  Results were reviewed/authorized by Bethena Midget, RN, BSN.  He was notified by Bethena Midget, RN, BSN.         Prior Anticoagulation Instructions: INR 2.4  Continue 2.5 mg daily except 5 mg Wednesday.  Recheck in 4 weeks.    Current Anticoagulation Instructions: INR 1.9 Today take 5mg s then continue 2.5mg s everyday except 5mg s on Wednesdays. Recheck in  4 weeks.

## 2010-06-21 NOTE — Letter (Signed)
Summary: [facility] Office Note   [facility] Office Note   Imported By: Roderic Ovens 07/21/2009 14:31:00  _____________________________________________________________________  External Attachment:    Type:   Image     Comment:   External Document

## 2010-06-21 NOTE — Assessment & Plan Note (Signed)
Summary: FLU/SB.  Nurse Visit   Allergies: 1)  ! Codeine 2)  ! * Protonix  Orders Added: 1)  Flu Vaccine 83yrs + MEDICARE PATIENTS [Q2039] 2)  Administration Flu vaccine - MCR [G0008] Flu Vaccine Consent Questions     Do you have a history of severe allergic reactions to this vaccine? no    Any prior history of allergic reactions to egg and/or gelatin? no    Do you have a sensitivity to the preservative Thimersol? no    Do you have a past history of Guillan-Barre Syndrome? no    Do you currently have an acute febrile illness? no    Have you ever had a severe reaction to latex? no    Vaccine information given and explained to patient? yes    Are you currently pregnant? no    Lot Number:[id number]   Exp Date:11/19/2010   Manufacturer: [employer]    Site Given  right Deltoid IMinistration Flu vaccine - MCR [G0008] .medflu
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
## 2010-06-21 NOTE — Medication Information (Signed)
Summary: new  Anticoagulant Therapy  Managed by: Rolland Porter, PharmD PCP: Hartley Barefoot MD Supervising MD: Antoine Poche MD, Fayrene Fearing Indication 1: Atrial fibrillation Indication 2: Encounter for therapeutic drug monitoring  V58.83 INR POC 2.1 INR RANGE 2.0-3.0  Dietary changes: no    Health status changes: no    Bleeding/hemorrhagic complications: no    Recent/future hospitalizations: no    Any changes in medication regimen? no    Recent/future dental: no  Any missed doses?: no       Is patient compliant with meds? yes       Current Medications (verified): 1)  Lipitor 80 Mg  Tabs (Atorvastatin Calcium) .... At Bedtime 2)  Nexium 40 Mg  Cpdr (Esomeprazole Magnesium) .... Once Daily 3)  Enalapril Maleate 20 Mg Tabs (Enalapril Maleate) .... Two Times A Day 4)  Metoprolol Succinate 50 Mg Xr24h-Tab (Metoprolol Succinate) .... Take 1 Tablet By Mouth Daily. 5)  Lipofen 150 Mg  Caps (Fenofibrate) .... One Tab By Mouth Daily 6)  Hydrochlorothiazide 25 Mg  Tabs (Hydrochlorothiazide) .Marland Kitchen.. 1 Tablet By Mouth Daily 7)  Hydroxyzine Hcl 50 Mg Tabs (Hydroxyzine Hcl) .... Take One Tablet Three Times A Day For Itching. 8)  Nitro-Dur 0.4 Mg/hr Pt24 (Nitroglycerin) .... Place One Tablet Under Your Tongue For Chest Pain.  Repeat in 5 Minutes If There Is No Effect.  Maximum of 3 Doses. Call Your Doctor If You Have Chest Pain. 9)  Vicodin 5-500 Mg Tabs (Hydrocodone-Acetaminophen) .... Take 1 Tablet By Mouth Every 6 Hour For Pain As Needed. 10)  Coumadin 5 Mg Tabs (Warfarin Sodium) .... Take As Directed. 11)  Flexeril 5 Mg Tabs (Cyclobenzaprine Hcl) .... Take 1 By Mouth Three Times A Day. 12)  Fenofibrate 160 Mg Tabs (Fenofibrate) .... Once Daily 13)  Voltaren 0.1 % Soln (Diclofenac Sodium) .... Apply Affeted Area Three Times A Day.  Allergies (verified): 1)  ! Codeine  Anticoagulation Management History:      The patient comes in today for his initial visit for anticoagulation therapy.  Positive  risk factors for bleeding include history of CVA/TIA and presence of serious comorbidities.  Negative risk factors for bleeding include an age less than 25 years old.  The bleeding index is 'intermediate risk'.  Positive CHADS2 values include History of HTN and Prior Stroke/CVA/TIA.  Negative CHADS2 values include Age > 34 years old.  The start date was 05/22/2006.  His last INR was 2.3.  Anticoagulation responsible provider: Antoine Poche MD, Fayrene Fearing.  INR POC: 2.1.  Cuvette Lot#: [id number].  Exp: 06/2010.    Anticoagulation Management Assessment/Plan:      The patient's current anticoagulation dose is Coumadin 5 mg tabs: Take as directed..  The next INR is due 07/05/2009.  Results were reviewed/authorized by Rolland Porter, PharmD.  He was notified by Rolland Porter.         Current Anticoagulation Instructions: INR 2.1  Patient had been managed by [facility] in [place].  Continue same dose, 5 mg on Wednesday's and 2.5 mg on all other days of the week.

## 2010-06-21 NOTE — Assessment & Plan Note (Signed)
Summary: ACUTE-OKAY TO ADD/KNEE PAIN/SWOLLEN/(REGALADO)/CFB   Vital Signs:  Patient profile:   65 year old male Height:      69 inches Weight:      191.4 pounds BMI:     28.37 Temp:     97.5 degrees F oral Pulse rate:   80 / minute BP sitting:   116 / 74  (right arm)  Vitals Entered By: Filomena Jungling NT II (June 25, 2009 10:53 AM) CC:  RIGHT KNEE PAIN X 64 WEEK, Depression Is Patient Diabetic? No Pain Assessment Patient in pain? yes     Location: knee Intensity: 9 Type: aching Nutritional Status BMI of 25 - 29 = overweight  Does patient need assistance? Functional Status Self care Ambulation Normal   Primary Care Provider:  Hartley Barefoot MD  CC:   RIGHT KNEE PAIN X 64 WEEK and Depression.  History of Present Illness: 65y/o man with multiple cardiac problems including A-Fib [facility], OSA. Depression and chronic elbow pain due to nerve impingement from arthritis comes to the clinic with pain full left knee  Started 1 week ago while he was trying to bend down, has been gradually getting worse, unable to bear weight now on the knee without support, have not had this kind of problem before, did not injure himself to have cased the pain, no other joint (except elbow) hurting, no fevers, chills, redness over the joint, h/o gout, or iv drug use. No rash on the body. Tried vicodin which did not help but alleve and volterin gel did releive the pain.   Had a stress echo 2 days back which was normal. No recent changes in medication   Depression History:      The patient denies a depressed mood most of the day and a diminished interest in his usual daily activities.         Preventive Screening-Counseling & Management  Alcohol-Tobacco     Alcohol drinks/day: 0     Smoking Status: quit     Packs/Day: 0     Year Quit: 23 years  Caffeine-Diet-Exercise     Exercise (avg: min/session): 6:15  Current Medications (verified): 1)  Lipitor 80 Mg   Tabs (Atorvastatin Calcium) .... At Bedtime 2)  Nexium 40 Mg  Cpdr (Esomeprazole Magnesium) .... Once Daily 3)  Enalapril Maleate 20 Mg Tabs (Enalapril Maleate) .... Two Times A Day 4)  Metoprolol Succinate 50 Mg Xr24h-Tab (Metoprolol Succinate) .... Take 1 Tablet By Mouth Daily. 5)  Lipofen 150 Mg  Caps (Fenofibrate) .... One Tab By Mouth Daily 6)  Hydrochlorothiazide 25 Mg  Tabs (Hydrochlorothiazide) .Marland Kitchen.. 1 Tablet By Mouth Daily 7)  Hydroxyzine Hcl 50 Mg Tabs (Hydroxyzine Hcl) .... Take One Tablet Three Times A Day For Itching. 8)  Nitro-Dur 0.4 Mg/hr Pt24 (Nitroglycerin) .... Place One Tablet Under Your Tongue For Chest Pain.  Repeat in 5 Minutes If There Is No Effect.  Maximum of 3 Doses. Call Your Doctor If You Have Chest Pain. 9)  Vicodin 5-500 Mg Tabs (Hydrocodone-Acetaminophen) .... Take 1 Tablet By Mouth Every 6 Hour For Pain As Needed. 10)  Coumadin 5 Mg Tabs (Warfarin Sodium) .... Take As Directed. 11)  Flexeril 5 Mg Tabs (Cyclobenzaprine Hcl) .... Take 1 By Mouth Three Times A Day. 12)  Fenofibrate 160 Mg Tabs (Fenofibrate) .... Once Daily 13)  Voltaren 0.1 % Soln (Diclofenac Sodium) .... Apply Affeted Area Three Times A Day. 14)  Ranitidine Hcl 150 Mg Tabs (  Ranitidine Hcl) .... Take One Tablet By Mouth Once Daily. 15)  Aspirin 81 Mg Tbec (Aspirin) .... Take One Tablet By Mouth Daily 16)  Hydrochlorothiazide 25 Mg Tabs (Hydrochlorothiazide) .... Take One Tablet By Mouth Daily. 17)  Warfarin Sodium 2.5 Mg Tabs (Warfarin Sodium) .... Uad  Allergies (verified): 1)  ! Codeine  Review of Systems  The patient denies anorexia, fever, weight loss, weight gain, vision loss, decreased hearing, hoarseness, chest pain, syncope, dyspnea on exertion, peripheral edema, prolonged cough, headaches, hemoptysis, abdominal pain, melena, hematochezia, severe indigestion/heartburn, hematuria, incontinence, genital sores, muscle weakness, suspicious skin lesions, transient blindness, difficulty walking,  depression, unusual weight change, abnormal bleeding, enlarged lymph nodes, angioedema, breast masses, and testicular masses.    Physical Exam  General:  alert, well-developed, and well-nourished.   Head:  normocephalic and atraumatic.   Eyes:  vision grossly intact, pupils equal, pupils round, pupils reactive to light, no injection and anicteric.    Mouth:   pharynx pink and moist, no erythema, and no exudates.    Neck:  supple, full ROM, and no masses.   Lungs:  normal respiratory effort, no intercostal retractions, no accessory muscle use, and normal breath sounds.   Heart:  normal rate and regular rhythm.   Abdomen:  soft, non-tender, normal bowel sounds, no distention, and no masses.   Msk:   Rt knee- mildly swollen and warm compared to the left. No locking or bony abormality. muscle spasm. No redness. Toes and other joints look normal  Pulses:  bilatera upper and lower extremities pulse present. Neurologic:  alert & oriented X3, cranial nerves II-XII intact, strength normal in all extremities, sensation intact to light touch, and gait normal.   Psych:  Oriented X3, memory intact for recent and remote, normally interactive, good eye contact, not anxious appearing, and not depressed appearing.    Impression & Recommendations: Gout vs OA vs sprain vs septic No fevers, PE - septic is less likely. OA less likely as started 1 week ago. Gradually started and getting worse makes acute gouty attack less likely. Could be a sprain or a mild gout attack. Alleve helped the pain. On coumadin so not tapping. Will countinue with alleve 1-2 times a day and instruct to come back soon if he develops fever, pain anywhere else, worsening of pain or swollen. In that case, will try a schedule three times a day NSAID and probably prednisone if needed. Otherwise if looks worse, might need to be off coumadin and tapping. No XRA today. May need if returns with worsening pain. D/W Dr. Coralee Pesa.     Problem # 2:   DEPRESSION (ICD-311) not looking depressed. Is going thrugh a tough time as his grand son was murdered recently, Is getting counselling from his minisiter. Offered antidepressants if it gets worse. Also asked to call us if he is having suicidal thoughts. Stable at this time  Problem # 3:  ELBOW PAIN, LEFT (ICD-719.42) wil put inan ortho referral for furhter eval of the pain. Possibly nerve impingement due to OA.   Orders: Orthopedic Referral (Ortho)  Problem # 4:  ATRIAL FIBRILLATION (ICD-427.31)  Stable. No complaints suggestive of a-fib at this time. Coumadin and [facility] at [facility]. continue to follow.   His updated medication list for this problem includes:    Metoprolol Succinate 50 Mg Xr24h-tab (Metoprolol succinate) .Marland Kitchen... Take 1 tablet by mouth daily.    Coumadin 5 Mg Tabs (Warfarin sodium) .Marland Kitchen... Take as directed.    Aspirin 81  Mg Tbec (Aspirin) .Marland Kitchen... Take one tablet by mouth daily    Warfarin Sodium 2.5 Mg Tabs (Warfarin sodium) ..... Uad  Complete Medication List: 1)  Lipitor 80 Mg Tabs (Atorvastatin calcium) .... At bedtime 2)  Nexium 40 Mg Cpdr (Esomeprazole magnesium) .... Once daily 3)  Enalapril Maleate 20 Mg Tabs (Enalapril maleate) .... Two times a day 4)  Metoprolol Succinate 50 Mg Xr24h-tab (Metoprolol succinate) .... Take 1 tablet by mouth daily. 5)  Lipofen 150 Mg Caps (Fenofibrate) .... One tab by mouth daily 6)  Hydrochlorothiazide 25 Mg Tabs (Hydrochlorothiazide) .Marland Kitchen.. 1 tablet by mouth daily 7)  Hydroxyzine Hcl 50 Mg Tabs (Hydroxyzine hcl) .... Take one tablet three times a day for itching. 8)  Nitro-dur 0.4 Mg/hr Pt24 (Nitroglycerin) .... Place one tablet under your tongue for chest pain.  repeat in 5 minutes if there is no effect.  maximum of 3 doses. call your doctor if you have chest pain. 9)  Vicodin 5-500 Mg Tabs (Hydrocodone-acetaminophen) .... Take 1 tablet by mouth every 6 hour for pain as needed. 10)  Coumadin 5 Mg Tabs (Warfarin sodium) ....  Take as directed. 11)  Flexeril 5 Mg Tabs (Cyclobenzaprine hcl) .... Take 1 by mouth three times a day. 12)  Fenofibrate 160 Mg Tabs (Fenofibrate) .... Once daily 13)  Voltaren 0.1 % Soln (Diclofenac sodium) .... Apply affeted area three times a day. 14)  Ranitidine Hcl 150 Mg Tabs (Ranitidine hcl) .... Take one tablet by mouth once daily. 15)  Aspirin 81 Mg Tbec (Aspirin) .... Take one tablet by mouth daily 16)  Hydrochlorothiazide 25 Mg Tabs (Hydrochlorothiazide) .... Take one tablet by mouth daily. 17)  Warfarin Sodium 2.5 Mg Tabs (Warfarin sodium) .... Uad  Patient Instructions: 1)  - Call me within a week if the symtoms not getting better, otherwise shedule follwo up appointment in 3 months 2)  - Take aleve and 1-2 times a day and flexeril for pain  Prevention & Chronic Care Immunizations   Influenza vaccine: Fluvax MCR  (02/18/2009)   Influenza vaccine due: 01/21/2011    Tetanus booster: Not documented   Td booster deferral: Deferred  (06/25/2009)   Tetanus booster due: 06/23/2015    Pneumococcal vaccine: Pneumovax  (02/24/2008)   Pneumococcal vaccine deferral: Deferred  (12/29/2008)    H. zoster vaccine: Not documented   H. zoster vaccine deferral: Not available  (06/25/2009)  Colorectal Screening   Hemoccult: Not documented   Hemoccult action/deferral: Refused  (06/25/2009)    Colonoscopy: Not documented   Colonoscopy action/deferral: Deferred  (06/25/2009)  Other Screening   PSA: Not documented   PSA action/deferral: Discussed-PSA requested  (12/29/2008)  Reports requested:   Last colonoscopy report requested.  Smoking status: quit  (06/25/2009)  Lipids   Total Cholesterol: Not documented   Lipid panel action/deferral: Deferred   LDL: Not documented   LDL Direct: Not documented   HDL: Not documented   Triglycerides: Not documented  Hypertension   Last Blood Pressure: 116 / 74  (06/25/2009)   Serum creatinine: 1.56  (02/18/2009)   Serum potassium 4.0   (02/18/2009)    Hypertension flowsheet reviewed?: Yes   Progress toward BP goal: At goal  Self-Management Support :   Personal Goals (by the next clinic visit) :      Personal blood pressure goal: 130/80  (06/25/2009)   Patient will work on the following items until the next clinic visit to reach self-care goals:     Medications and monitoring: take my medicines every  day  (06/25/2009)    Hypertension self-management support: Written self-care plan  (06/25/2009)   Hypertension self-care plan printed.   Nursing Instructions: Request report of last colonoscopy

## 2010-06-21 NOTE — Progress Notes (Signed)
Summary: Refill/gh  Phone Note Refill Request Message from:  Fax from Pharmacy on December 24, 2009 4:56 PM  Refills Requested: Medication #1:  RANITIDINE HCL 150 MG TABS Take one tablet by mouth once daily.   Dosage confirmed as above?Dosage Confirmed   Last Refilled: 09/21/2009  Method Requested: Electronic Initial call taken by: Angelina Ok RN,  December 24, 2009 4:57 PM  Follow-up for Phone Call       Follow-up by: Bethel Born MD,  December 28, 2009 10:12 AM    Prescriptions: RANITIDINE HCL 150 MG TABS (RANITIDINE HCL) Take one tablet by mouth once daily.  #31 x 11   Entered and Authorized by:   Bethel Born MD   Signed by:   Bethel Born MD on 12/28/2009   Method used:   Electronically to        [facility] [phone]* (retail)       [address]       [place], [place]  [address]       Ph: [phone] or [phone]       Fax: [phone]   RxID:   [id number]

## 2010-06-21 NOTE — Letter (Signed)
Summary: [facility] Office Note  [facility] Office Note   Imported By: Roderic Ovens 07/21/2009 15:35:20  _____________________________________________________________________  External Attachment:    Type:   Image     Comment:   External Document

## 2010-06-21 NOTE — Cardiovascular Report (Signed)
Summary: Office Visit   Office Visit   Imported By: Roderic Ovens 02/21/2010 16:05:17  _____________________________________________________________________  External Attachment:    Type:   Image     Comment:   External Document

## 2010-06-21 NOTE — Assessment & Plan Note (Signed)
Summary: Cardiology Nuclear Testing  Nuclear Med Background Indications for Stress Test: Evaluation for Ischemia, Post Hospital  Indications Comments: 7/11 VT Storm with 7 Shocks from AICD  History: Angioplasty, Defibrillator, Echo, Heart Catheterization, Myocardial Infarction, Myocardial Perfusion Study  History Comments: '86 MI>PTCA-CFX; '09 Echo:EF=35-40%; 8/10 AICD(ERI); 7/11 Cath:n/o CAD; 2/11 ZOX:WRUEA infero-lateral infarct, no ischemia, EF=37%; h/o afib   Symptoms: DOE, Fatigue    Nuclear Pre-Procedure Cardiac Risk Factors: CVA, Family History - CAD, History of Smoking, Hypertension, LBBB, Lipids Caffeine/Decaff Intake: None NPO After: 10:30 PM Lungs: Clear.  O2 Sat 96% on RA IV 0.9% NS with Angio Cath: 20g     IV Site: (R) AC IV Started by: Irean Hong RN Chest Size (in) 44     Height (in): 69 Weight (lb): 190 BMI: 28.16  Nuclear Med Study 1 or 2 day study:  1 day     Stress Test Type:  Adenosine Reading MD:  Arvilla Meres, MD     Referring MD:  Hillis Range, MD Resting Radionuclide:  Technetium 60m Tetrofosmin     Resting Radionuclide Dose:  10.9 mCi  Stress Radionuclide:  Technetium 66m Tetrofosmin     Stress Radionuclide Dose:  33 mCi   Stress Protocol  Dose of Adenosine:  48.4 mg    Stress Test Technologist:  Rea [employer] CMA-N     Nuclear Technologist:  Doyne Keel  Rest Procedure  Myocardial perfusion imaging was performed at rest 45 minutes following the intravenous administration of Myoview Technetium 74m Tetrofosmin.  Stress Procedure  The patient received IV adenosine at 140 mcg/kg/min for 4 minutes. There were no significant changes with infusion. Myoview was injected at the 2 minute mark and quantitative spect images were obtained after a 45 minute delay.  QPS Raw Data Images:  Normal; no motion artifact; normal heart/lung ratio. Stress Images:  Decreased uptake in the inferior and lateral walls.  Rest Images:  Decreased uptake in the  inferior and lateral walls.  Subtraction (SDS):  Previous dense inferolateral infarct. Trivial peri-infarct ischemia. Transient Ischemic Dilatation:  1.09  (Normal <1.22)  Lung/Heart Ratio:  .29  (Normal <0.45)  Quantitative Gated Spect Images QGS EDV:  194 ml QGS ESV:  125 ml QGS EF:  36 % QGS cine images:  Severe inferolateral hypokinesis. LV is dilated.  Findings Abormal nuclear study  Evidence for inferior infarct  Evidence for LV Dysfunction LV Dysfunction    Overall Impression  Exercise Capacity: Adenosine study with no exercise. ECG Impression: Baseline: NSR; No significant ST segment change with adenosine. Overall Impression: Abnormal stress nuclear study. Overall Impression Comments: Previous dense inferolateral infarct. Trivial peri-infarct ischemia. Dilated LV with EF 36%.  Appended Document: Cardiology Nuclear Testing Results above reviewed pt with known ischemic CM and s/p ICD  study done to evaluate for LAD ischemia given recent finding of moderate LAD stenosis on cath. This study reveals no significant LAD ischemia.  We will therefore continue medical therapy.  Appended Document: Cardiology Nuclear Testing lmom with results

## 2010-06-21 NOTE — Letter (Signed)
Summary: [facility] Note   [facility] Note   Imported By: Roderic Ovens 07/21/2009 14:28:59  _____________________________________________________________________  External Attachment:    Type:   Image     Comment:   External Document

## 2010-06-21 NOTE — Letter (Signed)
Summary: [facility] Note   [facility] Note   Imported By: Roderic Ovens 07/21/2009 14:36:15  _____________________________________________________________________  External Attachment:    Type:   Image     Comment:   External Document

## 2010-06-21 NOTE — Letter (Signed)
Summary: [facility] Appointment Time  [facility] Appointment Time   Imported By: Roderic Ovens 07/21/2009 15:34:20  _____________________________________________________________________  External Attachment:    Type:   Image     Comment:   External Document

## 2010-06-21 NOTE — Progress Notes (Signed)
Summary: samples of pradaxa  Phone Note Call from Patient Call back at Home Phone [phone]   Caller: [name] [phone] Reason for Call: Talk to Nurse Summary of Call: pt wife calling pt d.c from hospital today. need samples of PRADAXA 150 MG CAPS one by mouth two times a day. Initial call taken by: [name],  December 13, 2009 12:36 PM  Follow-up for Phone Call        left message for spouse, samples at the front desk for pick up [name], RN  December 13, 2009 2:48 PM

## 2010-06-21 NOTE — Letter (Signed)
Summary: [facility] Note   [facility] Note   Imported By: Roderic Ovens 07/21/2009 14:30:11  _____________________________________________________________________  External Attachment:    Type:   Image     Comment:   External Document

## 2010-06-21 NOTE — Assessment & Plan Note (Signed)
Summary: ACUTE-PER [name] F/U(DEVANI)   Vital Signs:  Patient profile:   65 year old male Height:      69 inches (175.26 cm) Weight:      191.9 pounds (85 kg) BMI:     27.71 Temp:     97.4 degrees F (36.33 degrees C) oral Pulse rate:   59 / minute BP sitting:   148 / 83  (left arm) Cuff size:   regular  Vitals Entered By: Theotis Barrio NT II (December 23, 2009 8:36 AM) CC: HOSPITAL FOLLOW UP / GIVEN NEW MEDS / BEEN HAVING LEFT KNEE PAIN FOR ABOUT A MONTH, Depression Is Patient Diabetic? No Pain Assessment Patient in pain? no      Nutritional Status BMI of > 30 = obese  Have you ever been in a relationship where you felt threatened, hurt or afraid?No   Does patient need assistance? Functional Status Self care Ambulation Normal Comments HSOPITAL FOLLOW UP / GIVEN NEW MEDS    Primary Care Provider:  Hartley Barefoot MD  CC:  HOSPITAL FOLLOW UP / GIVEN NEW MEDS / BEEN HAVING LEFT KNEE PAIN FOR ABOUT A MONTH and Depression.  History of Present Illness:  [name] is a 65 year old male with longstanding ischemic cardiomyopathy.  The patient was at home on the day of admission 7/21-7/25, unloading a pillar and experienced VT storm.  The patient had a total of 7 shots from his defibrillator. on d/c they gave him amio, and changed warfarin to pradaxa.  had a new cath which was negative.  has f/u on 8/22 for stress test.   Patient is feeling well and denies CP, abdominal pain, nausea, vomiting, HA's, palpitations, blurred vision. fever, chills, diarrhea, constipation or SOB.   Depression History:      The patient denies a depressed mood most of the day and a diminished interest in his usual daily activities.         Preventive Screening-Counseling & Management  Alcohol-Tobacco     Alcohol drinks/day: 0     Smoking Status: quit     Packs/Day: 0     Year Quit: 23 years  Caffeine-Diet-Exercise     Does Patient Exercise: YARD WORK     Exercise (avg: min/session):  6:15  Current Medications (verified): 1)  Lipitor 80 Mg  Tabs (Atorvastatin Calcium) .... At Bedtime 2)  Nexium 40 Mg  Cpdr (Esomeprazole Magnesium) .... Once Daily 3)  Enalapril Maleate 20 Mg Tabs (Enalapril Maleate) .... Two Times A Day 4)  Metoprolol Succinate 50 Mg Xr24h-Tab (Metoprolol Succinate) .... Take 1 Tablet By Mouth Daily. 5)  Lipofen 150 Mg  Caps (Fenofibrate) .... One Tab By Mouth Daily 6)  Hydrochlorothiazide 25 Mg  Tabs (Hydrochlorothiazide) .Marland Kitchen.. 1 Tablet By Mouth Daily 7)  Hydroxyzine Hcl 50 Mg Tabs (Hydroxyzine Hcl) .... Take One Tablet Three Times A Day For Itching. 8)  Nitro-Dur 0.4 Mg/hr Pt24 (Nitroglycerin) .... Place One Tablet Under Your Tongue For Chest Pain.  Repeat in 5 Minutes If There Is No Effect.  Maximum of 3 Doses. Call Your Doctor If You Have Chest Pain. 9)  Voltaren 0.1 % Soln (Diclofenac Sodium) .... As Needed 10)  Ranitidine Hcl 150 Mg Tabs (Ranitidine Hcl) .... Take One Tablet By Mouth Once Daily. 11)  Aspirin 81 Mg Tbec (Aspirin) .... Take One Tablet By Mouth Daily 12)  Zyrtec Hives Relief 10 Mg Tabs (Cetirizine Hcl) .... Take 1 Tablet By Mouth Daily. 13)  Pradaxa 150 Mg Caps (Dabigatran Etexilate Mesylate) .Marland KitchenMarland KitchenMarland Kitchen  One By Mouth Two Times A Day 14)  Amiodarone Hcl 400 Mg Tabs (Amiodarone Hcl) .... Take 1 Tablet By Mouth Once A Day 15)  Tramadol Hcl 50 Mg Tabs (Tramadol Hcl) .... Take 1 Tablet By Mouth Three Times A Day As Needed  Allergies (verified): 1)  ! Codeine  Social History: Does Patient Exercise:  YARD WORK  Review of Systems       Per [facility].  Physical Exam  General:  alert, well-developed, and well-nourished.   Head:  normocephalic and atraumatic.   Mouth:   pharynx pink and moist, no erythema, and no exudates.    Neck:  supple, full ROM, and no masses.   Lungs:  normal respiratory effort, no intercostal retractions, no accessory muscle use, and normal breath sounds.   Heart:  normal rate and regular rhythm.   Abdomen:  soft, non-tender,  normal bowel sounds, no distention, and no masses.   Msk:   Rt knee- mildly swollen and warm compared to the left. No locking or bony abormality. muscle spasm. No redness. Toes and other joints look normal  Pulses:  bilatera upper and lower extremities pulse present. Extremities:  No edema. Neurologic:  alert & oriented X3, cranial nerves II-XII intact, strength normal in all extremities, sensation intact to light touch, and gait normal.   Skin:   turgor normal and no rashes.  Psych:  Oriented X3, memory intact for recent and remote, normally interactive, good eye contact, not anxious appearing, and not depressed appearing.    Impression & Recommendations:  Problem # 1:  VENTRICULAR TACHYCARDIA (ICD-427.1) The patient was at home on the day of admission 7/21-7/25, unloading a pillar and experienced VT storm.  The patient had a total of 7 shots from his defibrillator. on d/c they gave him amio, and changed warfarin to pradaxa.  had a new cath which was negative.  has f/u on 8/22 for stress test. currently feeling well, is also going to pulmonary to establish care to follow with yearly for amio toxicity.  Note that the patient cannot drive for 3 months.   The following medications were removed from the medication list:    Coumadin 5 Mg Tabs (Warfarin sodium) .Marland Kitchen... Take as directed. His updated medication list for this problem includes:    Metoprolol Succinate 50 Mg Xr24h-tab (Metoprolol succinate) .Marland Kitchen... Take 1 tablet by mouth daily.    Aspirin 81 Mg Tbec (Aspirin) .Marland Kitchen... Take one tablet by mouth daily    Amiodarone Hcl 400 Mg Tabs (Amiodarone hcl) .Marland Kitchen... Take 1 tablet by mouth once a day  Problem # 2:  KNEE PAIN, ACUTE (ICD-719.46) now is ongoing for several months, inquired about narcotics for pain, i refused due to his comorbidities, i will give him a trial of tramadol, will reevaluate in one month.   His updated medication list for this problem includes:    Aspirin 81 Mg Tbec (Aspirin) .Marland Kitchen...  Take one tablet by mouth daily    Tramadol Hcl 50 Mg Tabs (Tramadol hcl) .Marland Kitchen... Take 1 tablet by mouth three times a day as needed  Problem # 3:  HYPERTENSION (ICD-401.9) Slightly elevated today, will recheck on next visit, if still elevated by then will adjust his htn regiment.   The following medications were removed from the medication list:    Hydrochlorothiazide 25 Mg Tabs (Hydrochlorothiazide) .Marland Kitchen... Take one tablet by mouth daily. His updated medication list for this problem includes:    Enalapril Maleate 20 Mg Tabs (Enalapril maleate) .Marland Kitchen..Marland Kitchen Two times a  day    Metoprolol Succinate 50 Mg Xr24h-tab (Metoprolol succinate) .Marland Kitchen... Take 1 tablet by mouth daily.    Hydrochlorothiazide 25 Mg Tabs (Hydrochlorothiazide) .Marland Kitchen... 1 tablet by mouth daily  Problem # 4:  HYPERLIPIDEMIA (ICD-272.4) on statin and fenofibrate, will check FLP and LFT today.   The following medications were removed from the medication list:    Fenofibrate 160 Mg Tabs (Fenofibrate) .Marland Kitchen... Take one tablet by mouth daily with a meal His updated medication list for this problem includes:    Lipitor 80 Mg Tabs (Atorvastatin calcium) .Marland Kitchen... At bedtime    Lipofen 150 Mg Caps (Fenofibrate) ..... One tab by mouth daily  Complete Medication List: 1)  Lipitor 80 Mg Tabs (Atorvastatin calcium) .... At bedtime 2)  Nexium 40 Mg Cpdr (Esomeprazole magnesium) .... Once daily 3)  Enalapril Maleate 20 Mg Tabs (Enalapril maleate) .... Two times a day 4)  Metoprolol Succinate 50 Mg Xr24h-tab (Metoprolol succinate) .... Take 1 tablet by mouth daily. 5)  Lipofen 150 Mg Caps (Fenofibrate) .... One tab by mouth daily 6)  Hydrochlorothiazide 25 Mg Tabs (Hydrochlorothiazide) .Marland Kitchen.. 1 tablet by mouth daily 7)  Hydroxyzine Hcl 50 Mg Tabs (Hydroxyzine hcl) .... Take one tablet three times a day for itching. 8)  Nitro-dur 0.4 Mg/hr Pt24 (Nitroglycerin) .... Place one tablet under your tongue for chest pain.  repeat in 5 minutes if there is no effect.   maximum of 3 doses. call your doctor if you have chest pain. 9)  Voltaren 0.1 % Soln (Diclofenac sodium) .... As needed 10)  Ranitidine Hcl 150 Mg Tabs (Ranitidine hcl) .... Take one tablet by mouth once daily. 11)  Aspirin 81 Mg Tbec (Aspirin) .... Take one tablet by mouth daily 12)  Zyrtec Hives Relief 10 Mg Tabs (Cetirizine hcl) .... Take 1 tablet by mouth daily. 13)  Pradaxa 150 Mg Caps (Dabigatran etexilate mesylate) .... One by mouth two times a day 14)  Amiodarone Hcl 400 Mg Tabs (Amiodarone hcl) .... Take 1 tablet by mouth once a day 15)  Tramadol Hcl 50 Mg Tabs (Tramadol hcl) .... Take 1 tablet by mouth three times a day as needed  Other Orders: T-Lipid Profile ([id number]) T-Comprehensive Metabolic Panel ([id number])  Patient Instructions: 1)  Please schedule a follow-up appointment in 1 month. Prescriptions: TRAMADOL HCL 50 MG TABS (TRAMADOL HCL) Take 1 tablet by mouth three times a day as needed  #90 x 0   Entered and Authorized by:   Darnelle Maffucci MD   Signed by:   Darnelle Maffucci MD on 12/23/2009   Method used:   Electronically to        [facility] [phone]* (retail)       [address]       [place], [place]  [address]       Ph: [phone] or [phone]       Fax: [phone]   RxID:   [id number]  Process Orders Check Orders Results:     [facility]: Check successful Tests Sent for requisitioning (December 23, 2009 11:20 AM):     12/23/2009: [facility] -- T-Lipid Profile [phone] (signed)     12/23/2009: [facility] -- T-Comprehensive Metabolic Panel [phone] (signed)     Prevention & Chronic Care Immunizations   Influenza vaccine: Fluvax MCR  (02/18/2009)   Influenza vaccine deferral: Deferred  (12/23/2009)   Influenza vaccine due: 01/21/2011    Tetanus booster: Not documented  Td booster deferral: Deferred  (06/25/2009)   Tetanus booster due:  06/23/2015    Pneumococcal vaccine: Pneumovax  (02/24/2008)   Pneumococcal vaccine deferral: Deferred  (12/29/2008)    H. zoster vaccine: Not documented   H. zoster vaccine deferral: Not available  (06/25/2009)  Colorectal Screening   Hemoccult: Not documented   Hemoccult action/deferral: Refused  (06/25/2009)    Colonoscopy: Not documented   Colonoscopy action/deferral: Deferred  (06/25/2009)  Other Screening   PSA: Not documented   PSA action/deferral: Discussed-PSA declined  (12/23/2009)   Smoking status: quit  (12/23/2009)  Lipids   Total Cholesterol: Not documented   Lipid panel action/deferral: Lipid Panel ordered   LDL: Not documented   LDL Direct: Not documented   HDL: Not documented   Triglycerides: Not documented    SGOT (AST): 32  (02/18/2009)   SGPT (ALT): 35  (02/18/2009) CMP ordered    Alkaline phosphatase: 56  (02/18/2009)   Total bilirubin: 0.6  (02/18/2009)  Hypertension   Last Blood Pressure: 148 / 83  (12/23/2009)   Serum creatinine: 1.9  (08/23/2009)   Serum potassium 4.1  (08/23/2009) CMP ordered     Hypertension flowsheet reviewed?: Yes   Progress toward BP goal: Deteriorated  Self-Management Support :   Personal Goals (by the next clinic visit) :      Personal blood pressure goal: 130/80  (06/25/2009)   Patient will work on the following items until the next clinic visit to reach self-care goals:     Medications and monitoring: take my medicines every day, bring all of my medications to every visit  (12/23/2009)     Eating: drink diet soda or water instead of juice or soda, eat more vegetables, use fresh or frozen vegetables, eat foods that are low in salt, eat baked foods instead of fried foods, eat fruit for snacks and desserts, limit or avoid alcohol  (12/23/2009)     Activity: take a 30 minute walk every day  (12/23/2009)    Hypertension self-management support: Written self-care plan  (12/23/2009)   Hypertension self-care plan  printed.    Lipid self-management support: Not documented

## 2010-06-21 NOTE — Assessment & Plan Note (Signed)
Summary: Cardiology Nuclear Study  Nuclear Med Background Indications for Stress Test: Evaluation for Ischemia, PTCA Patency   History: Angioplasty, Defibrillator, Echo, Heart Catheterization, Myocardial Infarction, Myocardial Perfusion Study  History Comments: '86 PTCA CFX,Hx. Multiple PTCA's. 2/96 Cath:30-50% LAD. 5/96 ZOX:[id number] scar,? mild ischemia, doesn't correlate with previous cath. 2/19 Echo:EF=35-40%. 8/10 AICD(ERI). Hx. ICM,AFIB,NSVT.  Symptoms: DOE, Light-Headedness, Near Syncope, Syncope    Nuclear Pre-Procedure Cardiac Risk Factors: CVA, Family History - CAD, History of Smoking, Hypertension, LBBB, Lipids Caffeine/Decaff Intake: None NPO After: 10:30 PM Lungs: clear IV 0.9% NS with Angio Cath: 20g     IV Site: (R) AC IV Started by: Irean Hong RN Chest Size (in) 42     Height (in): 69 Weight (lb): 184 BMI: 27.27 Tech Comments: Held Metoprolol 24 hr.  Nuclear Med Study 1 or 2 day study:  1 day     Stress Test Type:  Stress Reading MD:  Arvilla Meres, MD     Referring MD:  J.Allred Resting Radionuclide:  Technetium 74m Tetrofosmin     Resting Radionuclide Dose:  11.0 mCi  Stress Radionuclide:  Technetium 27m Tetrofosmin     Stress Radionuclide Dose:  33.0 mCi   Stress Protocol Exercise Time (min):  6:15 min     Max HR:  130 bpm     Predicted Max HR:  158 bpm  Max Systolic BP: 169 mm Hg     Percent Max HR:  82.28 %     METS: 7.0 Rate Pressure Product:  14782    Stress Test Technologist:  Milana Na EMT-P     Nuclear Technologist:  Burna Mortimer Deal RT-N  Rest Procedure  Myocardial perfusion imaging was performed at rest 45 minutes following the intravenous administration of Myoview Technetium 44m Tetrofosmin.  Stress Procedure  The patient exercised for 6:15. The patient stopped due to sob, and denied any chest pain.  There were no significant ST-T wave changes and rare pacs/pvcs.  Myoview was injected at peak exercise and myocardial perfusion  imaging was performed after a brief delay.  QPS Raw Data Images:  Normal; no motion artifact; normal heart/lung ratio. Stress Images:  Decreased perfusion in the inferior and lateral wall. Rest Images:  Decreased perfusion in the inferior and lateral wall. Subtraction (SDS):  There is a fixed defect that is most consistent with a previous infarction. Transient Ischemic Dilatation:  1.0  (Normal <1.22)  Lung/Heart Ratio:  .26  (Normal <0.45)  Quantitative Gated Spect Images QGS EDV:  172 ml QGS ESV:  109 ml QGS EF:  37 % QGS cine images:  Inferolateral hypokinesis. LV is dilated.  Findings Low risk nuclear study      Overall Impression  Exercise Capacity: Fair exercise capacity. BP Response: Normal blood pressure response. Clinical Symptoms: Dyspnea. ECG Impression: No significant ST segment change suggestive of ischemia. Overall Impression: Low risk stress nuclear study. Previous inferolateral infarct with no signifcant ischemia. Dilated LV. EF 37%.  Appended Document: Cardiology Nuclear Study The patient has a fixed defect (scar) but no obvious ischemia.  Continue medical management. please inform the patient.

## 2010-06-21 NOTE — Miscellaneous (Signed)
Summary: Orders Update pft charges  Clinical Lists Changes  Orders: Added new Service order of Carbon Monoxide diffusing w/capacity (94720) - Signed Added new Service order of Lung Volumes (94240) - Signed Added new Service order of Spirometry (Pre & Post) (94060) - Signed 

## 2010-06-21 NOTE — Progress Notes (Signed)
Summary: PRADAXA-LM  Phone Note Call from Patient   Caller: [name] Summary of Call: Patient's wife called [facility] ...he is ready to start Pradaxa and has several questions. Please call back at [phone]. Initial call taken by: Suzan Garibaldi RN  Follow-up for Phone Call        [facility] for them to call me back Caleb Bast, RN, BSN  Oct 19, 2009 3:12 PM    New/Updated Medications: PRADAXA 150 MG CAPS (DABIGATRAN ETEXILATE MESYLATE) one by mouth two times a day Prescriptions: PRADAXA 150 MG CAPS (DABIGATRAN ETEXILATE MESYLATE) one by mouth two times a day  #60 x 6   Entered by:   Caleb Bast, RN, BSN   Authorized by:   Laren Boom, MD, [facility]   Signed by:   Caleb Bast, RN, BSN on 10/19/2009   Method used:   Electronically to        [facility]. [phone]* (retail)       [address]       [place], [place]  [address]       Ph: [phone] or [phone]       Fax: [phone]   RxID:   [id number]

## 2010-06-21 NOTE — Medication Information (Signed)
Summary: rov/tm  Anticoagulant Therapy  Managed by: Bethena Midget, RN, BSN Referring MD: Tommie Sams PCP: Hartley Barefoot MD Supervising MD: Tenny Craw MD, Gunnar Fusi Indication 1: Atrial fibrillation Indication 2: Encounter for therapeutic drug monitoring  V58.83 Lab Used: [facility] Point of Care Finger Site: [address] INR POC 1.8 INR RANGE 2.0-3.0  Dietary changes: no    Health status changes: no    Bleeding/hemorrhagic complications: no    Recent/future hospitalizations: no    Any changes in medication regimen? no    Recent/future dental: no  Any missed doses?: no       Is patient compliant with meds? yes      Comments: Seeing Dr. Johney Frame today  Allergies: 1)  ! Codeine  Anticoagulation Management History:      The patient is taking warfarin and comes in today for a routine follow up visit.  Positive risk factors for bleeding include history of CVA/TIA and presence of serious comorbidities.  Negative risk factors for bleeding include an age less than 54 years old.  The bleeding index is 'intermediate risk'.  Positive CHADS2 values include History of HTN and Prior Stroke/CVA/TIA.  Negative CHADS2 values include Age > 41 years old.  The start date was 05/22/2006.  His last INR was 2.3.  Anticoagulation responsible provider: Tenny Craw MD, Gunnar Fusi.  INR POC: 1.8.  Cuvette Lot#: [id number].  Exp: 09/2010.    Anticoagulation Management Assessment/Plan:      The patient's current anticoagulation dose is Coumadin 5 mg tabs: Take as directed., Warfarin sodium 2.5 mg tabs: UAD.  The target INR is 2.0-3.0.  The next INR is due 09/06/2009.  Anticoagulation instructions were given to patient.  Results were reviewed/authorized by Bethena Midget, RN, BSN.  He was notified by Bethena Midget, RN, BSN.         Prior Anticoagulation Instructions: INR 1.9 Today take 5mg s then continue 2.5mg s everyday except 5mg s on Wednesdays. Recheck in 4 weeks.   Current Anticoagulation Instructions: INR 1.8 Change dose  to 2.5mg s everyday except 5mg s on Mondays and Fridays. Recheck in 2 weeks.

## 2010-06-21 NOTE — Progress Notes (Signed)
Summary: Pain  Phone Note Call from Patient   Caller: Spouse Summary of Call: Call from pt's wife said that pt saw a Cardiologist today and he believes that the pt's pain is coming from a pinched nerve  Cardiologist believes that he should go back to therapy.  Needs a refill on his Vicodin.  Needs to go to [facility] on [address]. Flexeril is not helping. Initial call taken by: Angelina Ok RN,  June 14, 2009 3:49 PM    I was considering that his pain was from nerve compression that was why I order nerve conduction study , that patient never had. Can you please make sure patient goes for nerve conduction study. I place the order a while ago. I will refilll his vicodin. I placed referral for PT. Prescriptions: VICODIN 5-500 MG TABS (HYDROCODONE-ACETAMINOPHEN) Take 1 tablet by mouth every 6 hour for pain as needed.  #60 x 0   Entered and Authorized by:   Hartley Barefoot MD   Signed by:   Hartley Barefoot MD on 06/15/2009   Method used:   Telephoned to ...       [facility] [address] [phone]* (retail)       [address]       [place], [place]  [address]       Ph: [phone]       Fax: [phone]   RxID:   [id number]

## 2010-06-21 NOTE — Progress Notes (Signed)
Summary: having trouble getting medication  Phone Note Call from Patient Call back at [phone]   Caller: Spouse/[name] Reason for Call: Talk to Nurse, Talk to Doctor Summary of Call: spouse is having a lot of trouble getting patients prodaxa they need prior autherization and she wants to talk to you  Initial call taken by: Omer Jack,  November 02, 2009 10:45 AM  Follow-up for Phone Call        waiting on fax to be sent to [place] Dennis Bast, RN, BSN  November 02, 2009 11:10 AM

## 2010-06-21 NOTE — Cardiovascular Report (Signed)
Summary: Office Visit Remote   Office Visit Remote   Imported By: Roderic Ovens 12/08/2009 16:11:58  _____________________________________________________________________  External Attachment:    Type:   Image     Comment:   External Document

## 2010-06-21 NOTE — Letter (Signed)
Summary: [facility] - 1996 - 2006  [facility] - 1610 - 2006   Imported By: Roderic Ovens 07/21/2009 14:19:01  _____________________________________________________________________  External Attachment:    Type:   Image     Comment:   External Document

## 2010-06-21 NOTE — Assessment & Plan Note (Signed)
Summary: MAKEOVER CLASS  Patient attended a 1 hour Extreme Makeover: Lifestyle meeting today. The meeting included information about: cooking- label reading, recipes, healthy eating (low sodium, portion control, lower and healthy fat,  high fiber and more fruits and vegetables) , moving our body more. Please ask patient what they learned at their next visit. Thank you for the referral.
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
## 2010-06-21 NOTE — Letter (Signed)
Summary: [facility] Office Note  [facility]   Imported By: Roderic Ovens 07/21/2009 15:31:49  _____________________________________________________________________  External Attachment:    Type:   Image     Comment:   External Document

## 2010-06-21 NOTE — Progress Notes (Signed)
Summary: refill/gg  Phone Note Refill Request  on March 21, 2010 12:34 PM  Refills Requested: Medication #1:  NEXIUM 40 MG  CPDR once daily   Dosage confirmed as above?Dosage Confirmed last visit 8/11   Method Requested: Electronic Initial call taken by: Merrie Roof RN,  March 21, 2010 12:36 PM  Follow-up for Phone Call       Follow-up by: Bethel Born MD,  March 21, 2010 1:51 PM    Prescriptions: NEXIUM 40 MG  CPDR (ESOMEPRAZOLE MAGNESIUM) once daily  #96 Each x 3   Entered and Authorized by:   Bethel Born MD   Signed by:   Bethel Born MD on 03/21/2010   Method used:   [facility]        [facility] [phone]* (retail)       [address]       [place], [place]  [address]       Ph: [phone] or [phone]       Fax: [phone]   RxID:   [id number]

## 2010-06-21 NOTE — Progress Notes (Signed)
Summary: Nuc Pre-Procedure  Phone Note Outgoing Call Call back at [facility] Phone [phone]   Call placed by: Antionette Char RN,  January 06, 2010 2:43 PM Call placed to: Patient Reason for Call: Confirm/change Appt Summary of Call: Left message with information on Myoview Information Sheet (see scanned document for details).     Nuclear Med Background Indications for Stress Test: Evaluation for Ischemia, Post Hospital  Indications Comments: 7/11 [facility] VT Storm- 7 Shocks from AICD  History: Angioplasty, Defibrillator, Echo, Heart Catheterization, Myocardial Infarction, Myocardial Perfusion Study  History Comments: '86 PTCA CFX,Hx. Multiple PTCA's. 2/96 Cath:30-50% LAD. 5/96 UJW:[id number] scar,? mild ischemia, doesn't correlate with previous cath. 2009 Echo:EF=35-40%. 8/10 AICD(ERI). 7/11 Heart Cath 70-80% LAD Stenosis 2/11 MPS  EF 37% Hx. ICM,AFIB,NSVT.  Symptoms: DOE, Light-Headedness, Near Syncope, Syncope    Nuclear Pre-Procedure Cardiac Risk Factors: CVA, Family History - CAD, History of Smoking, Hypertension, LBBB, Lipids Height (in): 69 Tech Comments: CVA Secondary to A-Fib  Nuclear Med Study Referring MD:  Tommie Sams

## 2010-06-21 NOTE — Letter (Signed)
Summary: Handout Printed  Printed Handout:  - Coumadin Instructions-w/out Meds 

## 2010-06-21 NOTE — Medication Information (Signed)
Summary: ccr/per pt call/jml  Anticoagulant Therapy  Managed by: Inactive Referring MD: J.Allred PCP: Hartley Barefoot MD Supervising MD: Excell Seltzer MD, Casimiro Needle Indication 1: Atrial fibrillation Indication 2: Encounter for therapeutic drug monitoring  V58.83 Lab Used: [facility] [place] Site: [address] INR POC 1.4 INR RANGE 2.0-3.0    Bleeding/hemorrhagic complications: no     Any changes in medication regimen? yes       Details: Starting Pradaxa today.   Any missed doses?: yes     Details: Off coumadin prior to Pradaxa start.    Allergies: 1)  ! Codeine  Anticoagulation Management History:      The patient is taking warfarin and comes in today for a routine follow up visit.  Positive risk factors for bleeding include history of CVA/TIA and presence of serious comorbidities.  Negative risk factors for bleeding include an age less than 19 years old.  The bleeding index is 'intermediate risk'.  Positive CHADS2 values include History of HTN and Prior Stroke/CVA/TIA.  Negative CHADS2 values include Age > 57 years old.  The start date was 05/22/2006.  His last INR was 2.3.  Anticoagulation responsible provider: Excell Seltzer MD, Casimiro Needle.  INR POC: 1.4.  Cuvette Lot#: [id number].  Exp: 12/2010.    Anticoagulation Management Assessment/Plan:      The patient's current anticoagulation dose is Coumadin 5 mg tabs: Take as directed..  The target INR is 2.0-3.0.  The next INR is due 10/04/2009.  Anticoagulation instructions were given to patient.  Results were reviewed/authorized by Inactive.  He was notified by Cloyde Reams RN.         Prior Anticoagulation Instructions: INR 3.2  Start NEW schedule of 5 mg on Wednesday and 2.5 mg all other days.  Return to clinic in 10 days.   Current Anticoagulation Instructions: Discontinuing coumadin, starting Pradaxa today.  Needs 10 day CBC.  Flag sent to [facility] to notify of start.

## 2010-06-21 NOTE — Letter (Signed)
Summary: [facility] Office Note   [facility] Note   Imported By: Roderic Ovens 07/21/2009 14:31:43  _____________________________________________________________________  External Attachment:    Type:   Image     Comment:   External Document

## 2010-06-21 NOTE — Letter (Signed)
Summary: [facility] Note   [facility] Note   Imported By: Roderic Ovens 07/21/2009 14:35:51  _____________________________________________________________________  External Attachment:    Type:   Image     Comment:   External Document

## 2010-06-21 NOTE — Letter (Signed)
Summary: [facility] Office Note   [facility] Note   Imported By: Roderic Ovens 07/21/2009 14:35:06  _____________________________________________________________________  External Attachment:    Type:   Image     Comment:   External Document

## 2010-06-21 NOTE — Progress Notes (Signed)
Summary: REFILL REQUEST  Phone Note Refill Request Message from:  Patient on March 01, 2010 10:10 AM  Refills Requested: Medication #1:  LIPITOR 80 MG  TABS at bedtime [facility] [address] [phone]    Method Requested: Telephone to Pharmacy Initial call taken by: Glynda Jaeger,  March 01, 2010 10:10 AM    Prescriptions: LIPITOR 80 MG  TABS (ATORVASTATIN CALCIUM) at bedtime  #96 Each x 1   Entered by:   Laurance Flatten CMA   Authorized by:   Hillis Range, MD   Signed by:   Laurance Flatten CMA on 03/03/2010   Method used:   Electronically to        [facility] [phone]* (retail)       [address]       [place], [place]  [address]       Ph: [phone] or [phone]       Fax: [phone]   RxID:   [id number] LIPITOR 80 MG  TABS (ATORVASTATIN CALCIUM) at bedtime  #96 Each x 1   Entered by:   Laurance Flatten CMA   Authorized by:   Hillis Range, MD   Signed by:   Laurance Flatten CMA on 03/03/2010   Method used:   [facility]ronically to        [facility] [address] [phone]retail)       [address]       [place], [place]  [address]       Ph: [phone] or [phone]       Fax: [phone]   RxID:   [id number]

## 2010-06-21 NOTE — Letter (Signed)
Summary: [facility]  [facility]   Imported By: Roderic Ovens 07/21/2009 15:29:24  _____________________________________________________________________  External Attachment:    Type:   Image     Comment:   External Document

## 2010-06-21 NOTE — Letter (Signed)
Summary: Device-Delinquent Phone Journalist, newspaper, Main Office  [address]   [place], [place] [address]   Phone: [phone]  Fax: [phone]     November 26, 2009 MRN: [id number]   [name] [address] [place], [place]  [address]   Dear Mr. BEE,  According to our records, you were scheduled for a device phone transmission on                              .     We did not receive any results from this check.  If you transmitted on your scheduled day, please call us to help troubleshoot your system.  If you forgot to send your transmission, please send one upon receipt of this letter.  Thank you,   Architectural technologist [facility]

## 2010-06-21 NOTE — Progress Notes (Signed)
Summary: cough, chest soreness/ hla  Phone Note Call from Patient   Summary of Call: pt's spouse  calls to say he has had a cough for 1 week, now coughing so hard it makes chest hurt, he is referred to either ED or urg care, his spouse sttes they are on their way to urg care now Initial call taken by: Marin Roberts RN,  February 07, 2010 4:19 PM

## 2010-06-21 NOTE — Cardiovascular Report (Signed)
Summary: Office Visit   Office Visit   Imported By: Roderic Ovens 08/27/2009 13:07:18  _____________________________________________________________________  External Attachment:    Type:   Image     Comment:   External Document

## 2010-06-21 NOTE — Letter (Signed)
Summary: [facility] Office Note  [facility] Office Note   Imported By: Roderic Ovens 07/21/2009 15:33:30  _____________________________________________________________________  External Attachment:    Type:   Image     Comment:   External Document

## 2010-06-21 NOTE — Medication Information (Signed)
Summary: rov/tm  Anticoagulant Therapy  Managed by: Cloyde Reams, RN, BSN Referring MD: Tommie Sams PCP: Hartley Barefoot MD Supervising MD: Juanda Chance MD, Rachit Grim Indication 1: Atrial fibrillation Indication 2: Encounter for therapeutic drug monitoring  V58.83 Lab Used: [facility] [place] Site: [address] INR POC 1.2 INR RANGE 2.0-3.0  Dietary changes: no    Health status changes: no    Bleeding/hemorrhagic complications: yes       Details: Scratching until bleeds, no blood coming from any where it's not supposed to be.   Recent/future hospitalizations: no    Any changes in medication regimen? yes       Details: Stopped fluid pill.  Recent/future dental: no  Any missed doses?: yes     Details: Missed a couple doses since last visit.  Is patient compliant with meds? yes       Allergies (verified): 1)  ! Codeine  Anticoagulation Management History:      The patient is taking warfarin and comes in today for a routine follow up visit.  Positive risk factors for bleeding include history of CVA/TIA and presence of serious comorbidities.  Negative risk factors for bleeding include an age less than 63 years old.  The bleeding index is 'intermediate risk'.  Positive CHADS2 values include History of HTN and Prior Stroke/CVA/TIA.  Negative CHADS2 values include Age > 67 years old.  The start date was 05/22/2006.  His last INR was 2.3.  Anticoagulation responsible provider: Juanda Chance MD, Smitty Cords.  INR POC: 1.2.  Cuvette Lot#: [id number].  Exp: 09/2010.    Anticoagulation Management Assessment/Plan:      The patient's current anticoagulation dose is Coumadin 5 mg tabs: Take as directed., Warfarin sodium 2.5 mg tabs: UAD.  The target INR is 2.0-3.0.  The next INR is due 09/16/2009.  Anticoagulation instructions were given to patient.  Results were reviewed/authorized by Cloyde Reams, RN, BSN.  He was notified by Cloyde Reams RN.         Prior Anticoagulation Instructions: INR  1.8 Change dose to 2.5mg s everyday except 5mg s on Mondays and Fridays. Recheck in 2 weeks.   Current Anticoagulation Instructions: INR 1.2  Take 7.5mg  today, then 5mg  tomorrow, then resume same dosage 2.5mg  daily except 5mg  on Mondays and Fridays.  Recheck in 10 days.

## 2010-06-21 NOTE — Letter (Signed)
Summary: [facility] Patient Vitals   Imported By: Roderic Ovens 07/21/2009 15:31:14  _____________________________________________________________________  External Attachment:    Type:   Image     Comment:   External Document

## 2010-06-21 NOTE — Letter (Signed)
Summary: [facility]   [facility]   Imported By: Roderic Ovens 07/21/2009 14:32:13  _____________________________________________________________________  External Attachment:    Type:   Image     Comment:   External Document

## 2010-06-21 NOTE — Assessment & Plan Note (Signed)
Summary: nep/pt has a defib/jml   Visit Type:  Initial Consult Primary Provider:  Hartley Barefoot MD  CC:  swelling and pain in left arm  .  History of Present Illness: [name] is a pleasant 65 yo AAM with a h/o CAD, ischemic CM, atrial fibrillation, and ICD implantation who presents to establish EP care.  He reports that he had his defibrillator replaced 01/05/09 for ERI battery status in [place].  He reports significant pain in his left arm since that time.  He was referred to physical therapy and notes some improvement in his arm pain.  During PT in December, he had a syncopal episode.  He reports that he was sitting following therapy and was told that his blood pressure was "low"  He stood to walk and developed severe unsteadiness with presyncope which resolved upon returning to a seated position.  He denies syncope.  He continues to have intermittent episodes of presyncope and unsteadiness upon standing on occasion.  He denies chest pain, SOB, orthopnea, PND, or edema.  He is noncomplaint with CPAP.  He has dypsnea with moderate activity.  He is otherwise without complaint today.  Current Medications (verified): 1)  Lipitor 80 Mg  Tabs (Atorvastatin Calcium) .... At Bedtime 2)  Nexium 40 Mg  Cpdr (Esomeprazole Magnesium) .... Once Daily 3)  Enalapril Maleate 20 Mg Tabs (Enalapril Maleate) .... Two Times A Day 4)  Metoprolol Succinate 50 Mg Xr24h-Tab (Metoprolol Succinate) .... Take 1 Tablet By Mouth Daily. 5)  Lipofen 150 Mg  Caps (Fenofibrate) .... One Tab By Mouth Daily 6)  Hydrochlorothiazide 25 Mg  Tabs (Hydrochlorothiazide) .Marland Kitchen.. 1 Tablet By Mouth Daily 7)  Hydroxyzine Hcl 50 Mg Tabs (Hydroxyzine Hcl) .... Take One Tablet Three Times A Day For Itching. 8)  Nitro-Dur 0.4 Mg/hr Pt24 (Nitroglycerin) .... Place One Tablet Under Your Tongue For Chest Pain.  Repeat in 5 Minutes If There Is No Effect.  Maximum of 3 Doses. Call Your Doctor If You Have Chest Pain. 9)  Vicodin 5-500 Mg Tabs  (Hydrocodone-Acetaminophen) .... Take 1 Tablet By Mouth Every 6 Hour For Pain As Needed. 10)  Coumadin 5 Mg Tabs (Warfarin Sodium) .... Take As Directed. 11)  Flexeril 5 Mg Tabs (Cyclobenzaprine Hcl) .... Take 1 By Mouth Three Times A Day. 12)  Fenofibrate 160 Mg Tabs (Fenofibrate) .... Once Daily 13)  Voltaren 0.1 % Soln (Diclofenac Sodium) .... Apply Affeted Area Three Times A Day. 14)  Ranitidine Hcl 150 Mg Tabs (Ranitidine Hcl) .... Take One Tablet By Mouth Once Daily. 15)  Aspirin 81 Mg Tbec (Aspirin) .... Take One Tablet By Mouth Daily 16)  Hydrochlorothiazide 25 Mg Tabs (Hydrochlorothiazide) .... Take One Tablet By Mouth Daily. 17)  Warfarin Sodium 2.5 Mg Tabs (Warfarin Sodium) .... Uad  Allergies (verified): 1)  ! 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 He has some sort of "implant" to improve this.  Past Surgical History: vocal cord surgery 2009 ICD implantation  Family History: Reviewed history from 03/11/2009 and no changes required. allergies-daughter rheumatism-mother heart disease-father  Social History: married very supportive wife.  Lives in [place]. Patient states former smoker.  quit in 1980's.  2ppd x 58yrs. 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   Vital Signs:  Patient profile:   65 year old male Height:      69 inches Weight:      187 pounds BMI:     27.71 Pulse rate:   60 /  minute Pulse (ortho):   78 / minute BP sitting:   118 / 78 BP standing:   124 / 75  Vitals Entered By: [name] CMA (June 14, 2009 11:42 AM)  Serial Vital Signs/Assessments:  Time      Position  BP       Pulse  Resp  Temp     By           Lying RA  123/75   68                    Jewel Hardy CMA           Sitting   117/73   69                    Jewel Hardy CMA           Standing  124/75   78                    Jewel Hardy CMA           Standing  126/80   68                    Jewel Hardy CMA           Standing  130/73   71                    Jewel Hardy CMA  Comments: No Symptoms By: [name] CMA    Physical Exam  General:  Well developed, well nourished, in no acute distress. Head:  normocephalic and atraumatic Eyes:  PERRLA/EOM intact; conjunctiva and lids normal. Nose:  no deformity, discharge, inflammation, or lesions Mouth:  Teeth, gums and palate normal. Oral mucosa normal. Neck:  Neck supple, no JVD. No masses, thyromegaly or abnormal cervical nodes. Chest Wall:  ICD pocket is well healed and nontender Lungs:  Clear bilaterally to auscultation and percussion. Heart:  RRR, no m/r/g Abdomen:  Bowel sounds positive; abdomen soft and non-tender without masses, organomegaly, or hernias noted. No hepatosplenomegaly. Msk:  Back normal, normal gait. Muscle strength and tone normal. Pulses:  pulses normal in all 4 extremities Extremities:  No clubbing or cyanosis.  1+BLE edema Neurologic:  Alert and oriented x 3.  strength/sensation are intact Skin:  Intact without lesions or rashes. Cervical Nodes:  no significant adenopathy Psych:  Normal affect.    ICD Specifications Following MD:  Hillis Range, MD     ICD Vendor:  [facility] Scientific     ICD Model Number:  E110     ICD Serial Number:  [id number] ICD DOI:  01/05/2009     ICD Implanting MD:  NOT IMPLANTED BY [place]  Lead 1:    Location: RA     DOI: 01/2004     Model #: [id number]     Serial #: [id number]     Status:  active Lead 2:    Location: RV  DOI: 01/2004     Model #: [id number]     Serial #: [id number]     Status: active  ICD Follow Up Remote Check?  No Charge Time:  8.5 seconds     Battery Est. Longevity:  8.5 YEARS ICD Dependent:  No       ICD Device Measurements Atrium:  Amplitude: 3.3 mV, Impedance: 564 ohms, Threshold: 0.4 V at 0.4 msec Right Ventricle:  Amplitude: 25 mV, Impedance: 541 ohms, Threshold: 0.9 V at 0.4 msec Shock Impedance: 58 ohms   Episodes MS Episodes:  1     Percent Mode Switch:  0%     Coumadin:  Yes Shock:  0     ATP:  1     Nonsustained:  31     Atrial Pacing:  37%     Ventricular Pacing:  <1%  Brady Parameters Mode DDDR     Lower Rate Limit:  55     Upper Rate Limit 120 PAV 260     Sensed AV Delay:  260  Tachy Zones VF:  200     VT:  175     Next Cardiology Appt Due:  08/20/2009 Tech Comments:  Reprogrammed to a 2 zone device today.  25 no therapy episodes sinus tachy.  ROV 3 months with Dr. Johney Frame.  Altha Harm, LPN  June 14, 2009 1:27 PM  MD Comments:  agree with above  Impression & Recommendations:  Problem # 1:  SYNCOPE AND COLLAPSE (ICD-780.2) Pt recently had presyncope while at PT.  He was documented to have low blood pressure and then had postural presyncope.  Adequate hydration is advised.  No medicine changes are recommended.  His ICD reveals no arrhythmias to cause this episode.  We will obtain a myoview to evaluate ischemia as a possible cause. His updated medication list for this problem includes:    Enalapril Maleate 20 Mg Tabs (Enalapril maleate) .Marland Kitchen..Marland Kitchen Two times a day    Metoprolol Succinate 50 Mg Xr24h-tab (Metoprolol succinate) .Marland Kitchen... Take 1 tablet by mouth daily.    Nitro-dur 0.4 Mg/hr Pt24 (Nitroglycerin) .Marland Kitchen... Place one tablet under your tongue for chest pain.  repeat in 5 minutes if there is no effect.  maximum of 3 doses. call your doctor if you have chest pain.    Coumadin 5 Mg Tabs (Warfarin sodium) .Marland Kitchen... Take as directed.    Aspirin 81 Mg  Tbec (Aspirin) .Marland Kitchen... Take one tablet by mouth daily    Warfarin Sodium 2.5 Mg Tabs (Warfarin sodium) ..... Uad  Orders: Nuclear Stress Test (Nuc Stress Test)  Problem # 2:  CAD (ICD-414.00) The patient reports dypsnea with exertion but is euvolemic on exam today. We will obtain a gxt myoview to evaluate ischemia as the cause for his symptoms.  His updated medication list for this problem includes:    Enalapril Maleate 20 Mg Tabs (Enalapril maleate) .Marland Kitchen..Marland Kitchen Two times a day    Metoprolol Succinate 50 Mg Xr24h-tab (Metoprolol succinate) .Marland Kitchen... Take 1 tablet by mouth daily.    Nitro-dur 0.4 Mg/hr Pt24 (Nitroglycerin) .Marland Kitchen... Place one tablet under your tongue for chest pain.  repeat in 5 minutes if there is no effect.  maximum of 3 doses. call your doctor if you have chest pain.    Coumadin 5 Mg Tabs (Warfarin sodium) .Marland Kitchen... Take as directed.    Aspirin 81 Mg Tbec (Aspirin) .Marland Kitchen... Take one tablet by mouth daily    Warfarin Sodium 2.5 Mg Tabs (Warfarin sodium) ..... Uad  Problem #  3:  ATRIAL FIBRILLATION (ICD-427.31) appears to be maintaining sinus according to ICD interrogation. Continue coumadin at this time.  His updated medication list for this problem includes:    Metoprolol Succinate 50 Mg Xr24h-tab (Metoprolol succinate) .Marland Kitchen... Take 1 tablet by mouth daily.    Coumadin 5 Mg Tabs (Warfarin sodium) .Marland Kitchen... Take as directed.    Aspirin 81 Mg Tbec (Aspirin) .Marland Kitchen... Take one tablet by mouth daily    Warfarin Sodium 2.5 Mg Tabs (Warfarin sodium) ..... Uad  Orders: Misc. Referral (Misc. Ref) Nuclear Stress Test (Nuc Stress Test)  Problem # 4:  HYPERTENSION (ICD-401.9) stable no changes His updated medication list for this problem includes:    Enalapril Maleate 20 Mg Tabs (Enalapril maleate) .Marland Kitchen..Marland Kitchen Two times a day    Metoprolol Succinate 50 Mg Xr24h-tab (Metoprolol succinate) .Marland Kitchen... Take 1 tablet by mouth daily.    Hydrochlorothiazide 25 Mg Tabs (Hydrochlorothiazide) .Marland Kitchen... 1 tablet by mouth daily     Aspirin 81 Mg Tbec (Aspirin) .Marland Kitchen... Take one tablet by mouth daily    Hydrochlorothiazide 25 Mg Tabs (Hydrochlorothiazide) .Marland Kitchen... Take one tablet by mouth daily.  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 Take 1 tablet by mouth daily.    Hydrochlorothiazide 25 Mg Tabs (Hydrochlorothiazide) .Marland Kitchen... 1 tablet by mouth daily    Nitro-dur 0.4 Mg/hr Pt24 (Nitroglycerin) .Marland Kitchen... Place one tablet under your tongue for chest pain.  repeat in 5 minutes if there is no effect.  maximum of 3 doses. call your doctor if you have chest pain.    Coumadin 5 Mg Tabs (Warfarin sodium) .Marland Kitchen... Take as directed.    Aspirin 81 Mg Tbec (Aspirin) .Marland Kitchen... Take one tablet by mouth daily    Hydrochlorothiazide 25 Mg Tabs (Hydrochlorothiazide) .Marland Kitchen... Take one tablet by mouth daily.    Warfarin Sodium 2.5 Mg Tabs (Warfarin sodium) ..... Uad  Orders: Nuclear Stress Test (Nuc Stress Test)  Patient Instructions: 1)  Your physician recommends that you schedule a follow-up appointment in: 3 months with Dr Johney Frame 2)  Your physician has requested that you have an exercise stress myoview.  For further information please visit [email or web address]  Please follow instruction sheet, as given. 3)  You have been referred to the [facility]  Appended Document: nep/pt has a defib/jml ICD pocket is well healed and not likely the cause for his L arm pain.  He primarily describes ulnar distribution of pain.  I suspect that he may have a nerve compression/ cervical disk issue.  I have recommended that he follow-up with his PCP.  OK to return to PT.

## 2010-06-21 NOTE — Progress Notes (Signed)
Summary: samples  Phone Note Call from Patient Call back at [phone]   Caller: Spouse Reason for Call: Talk to Nurse Summary of Call: request samples of pradaxa..... having trouble getting free 30 day supply Initial call taken by: Migdalia Dk,  November 03, 2009 11:31 AM  Follow-up for Phone Call        Pradaxa 150mg  (#36 tablets) placed at the front desk for pt's wife to pick-up.  Follow-up by: Julieta Gutting, RN, BSN,  November 03, 2009 11:57 AM

## 2010-06-21 NOTE — Cardiovascular Report (Signed)
Summary: Office Visit   Office Visit   Imported By: Roderic Ovens 06/16/2009 13:59:07  _____________________________________________________________________  External Attachment:    Type:   Image     Comment:   External Document

## 2010-06-21 NOTE — Letter (Signed)
Summary: ICD Registry Data Collection Form  ICD Registry Data Collection Form   Imported By: Roderic Ovens 07/21/2009 15:28:42  _____________________________________________________________________  External Attachment:    Type:   Image     Comment:   External Document

## 2010-06-21 NOTE — Assessment & Plan Note (Signed)
Summary: defb/saf   Primary Provider:  Hartley Barefoot MD   History of Present Illness: The patient presents today for routine electrophysiology followup. He reports doing very well since last being seen in our clinic. The patient denies symptoms of palpitations, chest pain, shortness of breath, orthopnea, PND, lower extremity edema, dizziness, presyncope, syncope, or neurologic sequela. He has significantly reduced salt and reports occasional cramping.  The patient is tolerating medications without difficulties and is otherwise without complaint today.   Current Medications (verified): 1)  Lipitor 80 Mg  Tabs (Atorvastatin Calcium) .... At Bedtime 2)  Nexium 40 Mg  Cpdr (Esomeprazole Magnesium) .... Once Daily 3)  Enalapril Maleate 20 Mg Tabs (Enalapril Maleate) .... Two Times A Day 4)  Metoprolol Succinate 50 Mg Xr24h-Tab (Metoprolol Succinate) .... Take 1 Tablet By Mouth Daily. 5)  Lipofen 150 Mg  Caps (Fenofibrate) .... One Tab By Mouth Daily 6)  Hydrochlorothiazide 25 Mg  Tabs (Hydrochlorothiazide) .Marland Kitchen.. 1 Tablet By Mouth Daily 7)  Hydroxyzine Hcl 50 Mg Tabs (Hydroxyzine Hcl) .... Take One Tablet Three Times A Day For Itching. 8)  Nitro-Dur 0.4 Mg/hr Pt24 (Nitroglycerin) .... Place One Tablet Under Your Tongue For Chest Pain.  Repeat in 5 Minutes If There Is No Effect.  Maximum of 3 Doses. Call Your Doctor If You Have Chest Pain. 9)  Coumadin 5 Mg Tabs (Warfarin Sodium) .... Take As Directed. 10)  Voltaren 0.1 % Soln (Diclofenac Sodium) .... As Needed 11)  Ranitidine Hcl 150 Mg Tabs (Ranitidine Hcl) .... Take One Tablet By Mouth Once Daily. 12)  Aspirin 81 Mg Tbec (Aspirin) .... Take One Tablet By Mouth Daily 13)  Hydrochlorothiazide 25 Mg Tabs (Hydrochlorothiazide) .... Take One Tablet By Mouth Daily. 14)  Warfarin Sodium 2.5 Mg Tabs (Warfarin Sodium) .... Uad 15)  Fenofibrate 160 Mg Tabs (Fenofibrate) .... Take One Tablet By Mouth Daily With A Meal  Allergies (verified): 1)  !  Codeine  Past History:  Past Medical History: Reviewed history from 06/14/2009 and no changes required. 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 He has some sort of "implant" to improve this.  Past Surgical History: Reviewed history from 06/14/2009 and no changes required. vocal cord surgery 2009 ICD implantation  Social History: Reviewed history from 06/14/2009 and no changes required. married very supportive wife.  Lives in [place]. Patient states former smoker.  quit in 1980's.  2ppd x 28yrs. 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   Vital Signs:  Patient profile:   65 year old male Height:      69 inches Weight:      187 pounds BMI:     27.71 Pulse rate:   67 / minute BP sitting:   112 / 62  (left arm)  Vitals Entered By: Laurance Flatten CMA (August 23, 2009 9:22 AM)  Physical Exam  General:  alert, well-developed, and well-nourished.   Head:  normocephalic and atraumatic.   Eyes:  vision grossly intact, pupils equal, pupils round, pupils reactive to light, no injection and anicteric.  Mouth:   pharynx pink and moist, no erythema, and no exudates.    Neck:  supple, full ROM, and no masses.   Chest Wall:  ICD pocket is well healed and nontender Lungs:  normal respiratory effort, no intercostal retractions, no accessory muscle use, and normal breath sounds.   Heart:  normal rate and regular rhythm.   Abdomen:  soft, non-tender, normal bowel sounds,  no distention, and no masses.   Msk:  walks without difficulty today Pulses:  pulses normal in all 4 extremities Extremities:  No clubbing or cyanosis. Neurologic:  Alert and oriented x 3. Skin:  Intact without lesions or rashes. Psych:  Normal affect.    ICD Specifications Following MD:  Hillis Range, MD     ICD Vendor:  [facility] [employer]     ICD Model Number:  E110     ICD Serial Number:  [id number] ICD DOI:  01/05/2009     ICD Implanting MD:  NOT IMPLANTED BY [place]  Lead 1:    Location: RA     DOI: 01/2004     Model #: [id number]     Serial #: [id number]     Status: active Lead 2:    Location: RV     DOI: 01/2004     Model #: [id number]     Serial #: [id number]     Status: active  ICD Follow Up Remote Check?  No Battery Voltage:  good V     Charge Time:  8.6 seconds     Battery Est. Longevity:  BOL Underlying rhythm:  SR ICD Dependent:  No       ICD Device Measurements Atrium:  Amplitude: 3.4 mV, Impedance: 533 ohms, Threshold: 0.4 V at 0.4 msec Right Ventricle:  Amplitude: 20.4 mV, Impedance: 521 ohms, Threshold: 1.0 V at 0.4 msec Shock Impedance: 52 ohms   Episodes MS Episodes:  0     Percent Mode Switch:  0     Coumadin:  Yes Shock:  0     ATP:  0     Nonsustained:  1     Atrial Pacing:  37%     Ventricular Pacing:  <1%  Brady Parameters Mode DDDR     Lower Rate Limit:  55     Upper Rate Limit 120 PAV 260     Sensed AV Delay:  260  Tachy Zones VF:  200     VT:  175     Next Remote Date:  11/25/2009     Next Cardiology Appt Due:  02/19/2010 Tech Comments:  No parameter changes.  Device function normal.  1 NSVT episodes 7 seconds.  Latitude transmissions every 3 months.  ROV 6 months with Dr. Johney Frame. 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 no changes today Given recent cramping, we will check BMET today. salt restriction advised  Problem  # 2:  ATRIAL FIBRILLATION (ICD-427.31) stable continue coumadin  Problem # 3:  SYNCOPE AND COLLAPSE (ICD-780.2) no further episodes of orthostatic presyncope maintain hydration check BMET today  Problem # 4:  HYPERTENSION (ICD-401.9) at goal today check BMET  Other Orders: TLB-BMP (Basic Metabolic Panel-BMET) (80048-METABOL)  Patient Instructions: 1)  Your physician recommends that you schedule a follow-up appointment in: 6 months 2)  Your physician recommends that you continue on your current medications as directed. Please refer  to the Current Medication list given to you today. 3)  Your physician recommends that you have lab work today: bmp

## 2010-06-21 NOTE — Progress Notes (Signed)
Summary: Allergies  Phone Note Call from Patient   Caller: Patient Call For: Caleb Barefoot MD Complaint: Urinary/GYN Problems Summary of Call: Pt here with wife wanted to know if he could get something for his seasonal allergies.  Is unable to take Benadryl. Wants something by prescription if possible.Uses [facility] in [place].  Pt has scheduled an appointment for Monday. Angelina Ok RN  August 25, 2009 11:45 AM   Follow-up for Phone Call        Prescription faxed to the [facility] in [place]. Follow-up by: Angelina Ok RN,  August 30, 2009 4:10 PM    New/Updated Medications: ZYRTEC HIVES RELIEF 10 MG TABS (CETIRIZINE HCL) Take 1 tablet by mouth daily. Will prescribe Zyrtec,Prescriptions: ZYRTEC HIVES RELIEF 10 MG TABS (CETIRIZINE HCL) Take 1 tablet by mouth daily.  #30 x 4   Entered by:   Caleb Barefoot MD   Authorized by:   Angelina Ok RN   Signed by:   Caleb Barefoot MD on 08/30/2009   Method used:   Telephoned to ...       [facility] [address] [phone]* (retail)       [address]       [place], [place]  [address]       Ph: [phone]       Fax: [phone]   RxID:   [id number]

## 2010-06-21 NOTE — Letter (Signed)
Summary: Office Notes and Stress Tests 2000 - 2006  Office Notes and Stress Tests 2000 - 2006   Imported By: Roderic Ovens 07/21/2009 12:56:46  _____________________________________________________________________  External Attachment:    Type:   Image     Comment:   External Document

## 2010-06-21 NOTE — Letter (Signed)
Summary: [facility]   Imported By: Roderic Ovens 07/21/2009 14:44:33  _____________________________________________________________________  External Attachment:    Type:   Image     Comment:   External Document

## 2010-06-21 NOTE — Letter (Signed)
Summary: Remote Device Check  [facility], Main Office  [address]   [name], [place] [address]   Phone: [phone]  Fax: [phone]     December 08, 2009 MRN: [id number]   [name] [address] [place], [place]  [address]   Dear [name],   Your remote transmission was recieved and reviewed by your physician.  All diagnostics were within normal limits for you.   __X____Your next office visit is scheduled for: October with Dr Johney Frame. Please call our office to schedule an appointment.    Sincerely,  Vella Kohler

## 2010-06-23 NOTE — Letter (Signed)
Summary: New Patient letter  [facility]  [address] [place], [place] [address]   Phone: [phone]  Fax: [phone]       06/14/2010 MRN: [id number]  [facility] [address] [place], [place]  [address]  Dear [name],  Welcome to the Gastroenterology Division at [facility].    You are scheduled to see Dr.  Arlyce Dice on 07-19-10 at 8:30am on the 3rd floor at [facility], [address]  We ask that you try to arrive at our office 15 minutes prior to your appointment time to allow for check-in.  We would like you to complete the enclosed self-administered evaluation form prior to your visit and bring it with you on the day of your appointment.  We will review it with you.  Also, please bring a complete list of all your medications or, if you prefer, bring the medication bottles and we will list them.  Please bring your insurance card so that we may make a copy of it.  If your insurance requires a referral to see a specialist, please bring your referral form from your primary care physician.  Co-payments are due at the time of your visit and may be paid by cash, check or credit card.     Your office visit will consist of a consult with your physician (includes a physical exam), any laboratory testing he/she may order, scheduling of any necessary diagnostic testing (e.g. x-ray, ultrasound, CT-scan), and scheduling of a procedure (e.g. Endoscopy, Colonoscopy) if required.  Please allow enough time on your schedule to allow for any/all of these possibilities.    If you cannot keep your appointment, please call [phone] to cancel or reschedule prior to your appointment date.  This allows [place] the opportunity to schedule an appointment for another patient in need of care.  If you do not cancel or reschedule by 5 p.m. the business day prior to your appointment date, you will be charged a $50.00 late cancellation/no-show fee.    Thank you for choosing  [facility] for your medical needs.  We appreciate the opportunity to care for you.  Please visit [place] at our website  to learn more about our practice.                     Sincerely,                                                             The Gastroenterology Division

## 2010-06-23 NOTE — Progress Notes (Signed)
Summary: Refill/gh  Phone Note Refill Request Message from:  Patient on May 26, 2010 9:40 AM  Refills Requested: Medication #1:  LIPITOR 80 MG  TABS at bedtime  Medication #2:  NEXIUM 40 MG  CPDR once daily  Medication #3:  ENALAPRIL MALEATE 20 MG TABS 1 tab two times a day  Medication #4:  METOPROLOL SUCCINATE 50 MG XR24H-TAB Take 1 tablet by mouth daily. Pt is changing to a mail order prescription program.  Needs a paper copy of all meds to be sent in by the pt.  Can be reached at [phone].  Last labs and office visit here was 12/23/2009.   Method Requested: Pick up at Office Initial call taken by: Angelina Ok RN,  May 26, 2010 9:42 AM  Follow-up for Phone Call        I sent a flag to Ms Lissa Hoard to schedule an appt.  Follow-up by: Blanch Media MD,  May 26, 2010 2:30 PM    Prescriptions: AMIODARONE HCL 200 MG TABS (AMIODARONE HCL) take 1 tablet daily  #90 x 0   Entered and Authorized by:   Blanch Media MD   Signed by:   Blanch Media MD on 05/26/2010   Method used:   Print then Give to Patient   RxID:   [id number] PRADAXA 150 MG CAPS (DABIGATRAN ETEXILATE MESYLATE) one by mouth two times a day  #180 x 0   Entered and Authorized by:   Blanch Media MD   Signed by:   Blanch Media MD on 05/26/2010   Method used:   Print then Give to Patient   RxID:   [id number] ASPIRIN 81 MG TBEC (ASPIRIN) Take one tablet by mouth daily  #90 x 0   Entered and Authorized by:   Blanch Media MD   Signed by:   Blanch Media MD on 05/26/2010   Method used:   Print then Give to Patient   RxID:   [id number] RANITIDINE HCL 150 MG TABS (RANITIDINE HCL) Take one tablet by mouth once daily.  #90 x 0   Entered and Authorized by:   Blanch Media MD   Signed by:   Blanch Media MD on 05/26/2010   Method used:   Print then Give to Patient   RxID:   [id number] VOLTAREN 0.1 % SOLN (DICLOFENAC SODIUM) as needed  #150 ml x 0   Entered  and Authorized by:   Blanch Media MD   Signed by:   Blanch Media MD on 05/26/2010   Method used:   Print then Give to Patient   RxID:   [id number] NITROSTAT 0.4 MG SUBL (NITROGLYCERIN) 1 tablet under tongue at onset of chest pain; you may repeat every 5 minutes for up to 3 doses.  #20 x 1   Entered and Authorized by:   Blanch Media MD   Signed by:   Blanch Media MD on 05/26/2010   Method used:   Print then Give to Patient   RxID:   [id number] FENOFIBRATE 160 MG TABS (FENOFIBRATE) Take one tablet by mouth once daily.  #90 x 0   Entered and Authorized by:   Blanch Media MD   Signed by:   Blanch Media MD on 05/26/2010   Method used:   Print then Give to Patient   RxID:   [id number] METOPROLOL SUCCINATE 50 MG XR24H-TAB (METOPROLOL SUCCINATE) Take 1 tablet by mouth daily.  #90 x 0   Entered and Authorized by:   Blanch Media MD  Signed by:   Blanch Media MD on 05/26/2010   Method used:   Print then Give to Patient   RxID:   [id number] ENALAPRIL MALEATE 20 MG TABS (ENALAPRIL MALEATE) 1 tab two times a day  #180 x 0   Entered and Authorized by:   Blanch Media MD   Signed by:   Blanch Media MD on 05/26/2010   Method used:   Print then Give to Patient   RxID:   [id number] NEXIUM 40 MG  CPDR (ESOMEPRAZOLE MAGNESIUM) once daily  #96 Each x 0   Entered and Authorized by:   Blanch Media MD   Signed by:   Blanch Media MD on 05/26/2010   Method used:   Print then Give to Patient   RxID:   [id number] LIPITOR 80 MG  TABS (ATORVASTATIN CALCIUM) at bedtime  #90 x 0   Entered and Authorized by:   Blanch Media MD   Signed by:   Blanch Media MD on 05/26/2010   Method used:   Print then Give to Patient   RxID:   [id number]

## 2010-06-23 NOTE — Progress Notes (Signed)
Summary: Refill/gh  Phone Note Refill Request Message from:  Patient on May 26, 2010 9:45 AM  Refills Requested: Medication #1:  FENOFIBRATE 160 MG TABS Take one tablet by mouth once daily.  Medication #2:  NITROSTAT 0.4 MG SUBL 1 tablet under tongue at onset of chest pain; you may repeat every 5 minutes for up to 3 doses.  Medication #3:  VOLTAREN 0.1 % SOLN as needed  Medication #4:  RANITIDINE HCL 150 MG TABS Take one tablet by mouth once daily. Pt is switching to a mail order pharmacy and needs paper copy of all prescription.  Last office visit and labs here was 12/23/2009.    Method Requested: Pick up at Office Initial call taken by: Angelina Ok RN,  May 26, 2010 9:46 AM

## 2010-06-23 NOTE — Progress Notes (Signed)
Summary: Refill/gh  Phone Note Refill Request Message from:  Patient on May 26, 2010 9:47 AM  Refills Requested: Medication #1:  ASPIRIN 81 MG TBEC Take one tablet by mouth daily  Medication #2:  PRADAXA 150 MG CAPS one by mouth two times a day  Medication #3:  AMIODARONE HCL 200 MG TABS take 1 tablet daily  Medication #4:  NEXIUM 40 MG CPDR 1 tab once daily. Pt is changin to a mail order pharmacy and needs a paper copy of all prescriptions.  can be reached at [phone] for pick up.  last seen in the office for labs and visit 12/23/2009.   Method Requested: Pick up at Office Initial call taken by: Angelina Ok RN,  May 26, 2010 9:48 AM

## 2010-06-23 NOTE — Assessment & Plan Note (Signed)
Summary: checkup,needs scripts/pcp-devani/hla   Vital Signs:  Patient profile:   65 year old male Height:      69 inches Weight:      202.7 pounds BMI:     30.04 Temp:     97.6 degrees F oral Pulse rate:   56 / minute BP sitting:   123 / 76  (right arm)  Vitals Entered By: Filomena Jungling NT II (June 08, 2010 2:12 PM) Is Patient Diabetic? No Pain Assessment Patient in pain? no      Nutritional Status BMI of > 30 = obese  Have you ever been in a relationship where you felt threatened, hurt or afraid?No   Does patient need assistance? 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 He was admitted in 7/11 for Vtach storm with 7 episdoes of firing from his defibrillator . Subsequently, had a cath in the hospital which showed 70% obs in LAD but did not have any abnormality on myovies and is being medically managed.   no new compalaints today no CP, no more shocks from defibrillator, no Sob compliant with medications checks BP at home and he ays it is usually well controlled  has been exercising and controlling his diet     Preventive Screening-Counseling & Management  Alcohol-Tobacco     Alcohol drinks/day: 0     Smoking Status: quit     Packs/Day: 0     Year Quit: 23 years  Caffeine-Diet-Exercise     Does Patient Exercise: YARD WORK     Exercise (avg: min/session): 6:15  Current Medications (verified): 1)  Lipitor 80 Mg  Tabs (Atorvastatin Calcium) .... At Bedtime 2)  Nexium 40 Mg  Cpdr (Esomeprazole Magnesium) .... Once Daily 3)  Enalapril Maleate 20 Mg Tabs (Enalapril Maleate) .Marland Kitchen.. 1 Tab Two Times A Day 4)  Metoprolol Succinate 50 Mg Xr24h-Tab (Metoprolol Succinate) .... Take 1 Tablet By Mouth Daily. 5)  Fenofibrate 160 Mg Tabs (Fenofibrate) .... Take One Tablet By Mouth Once Daily. 6)   Nitrostat 0.4 Mg Subl (Nitroglycerin) .Marland Kitchen.. 1 Tablet Under Tongue At Onset of Chest Pain; You May Repeat Every 5 Minutes For Up To 3 Doses. 7)  Voltaren 0.1 % Soln (Diclofenac Sodium) .... As Needed 8)  Ranitidine Hcl 150 Mg Tabs (Ranitidine Hcl) .... Take One Tablet By Mouth Once Daily. 9)  Aspirin 81 Mg Tbec (Aspirin) .... Take One Tablet By Mouth Daily 10)  Pradaxa 150 Mg Caps (Dabigatran Etexilate Mesylate) .... One By Mouth Two Times A Day 11)  Amiodarone Hcl 200 Mg Tabs (Amiodarone Hcl) .... Take 1 Tablet Daily 12)  Nexium 40 Mg Cpdr (Esomeprazole Magnesium) .Marland Kitchen.. 1 Tab Once Daily  Allergies (verified): 1)  ! Codeine 2)  ! * Protonix  Review of Systems  The patient denies anorexia, fever, weight loss, weight gain, vision loss, decreased hearing, hoarseness, chest pain, syncope, dyspnea on exertion, peripheral edema, prolonged cough, headaches, hemoptysis, abdominal pain, melena, hematochezia, severe indigestion/heartburn, hematuria, incontinence, genital sores, muscle weakness, suspicious skin lesions, transient blindness, difficulty walking, depression, unusual weight change, abnormal bleeding, enlarged lymph nodes, angioedema, breast masses, and testicular masses.    Physical Exam  General:  Gen: VS reveiwed, Alert, well developed, nodistress ENT: mucous membranes pink & moist. No abnormal finds in ear and nose. CVC:S1 S2 , no murmurs, no abnormal  heart sounds. Lungs: Clear to auscultation B/L. No wheezes, crackles or other abnormal sounds Abdomen: soft, non distended, no tender. Normal Bowel sounds EXT: no pitting edema, no engorged veins, Pulsations normal  Neuro:alert, oriented *3, cranial nerved 2-12 intact, strenght normal in all  extremities, senstations normal to light touch.      Impression & Recommendations:  Problem # 1:  HYPERLIPIDEMIA (ICD-272.4) at goal  His updated medication list for this problem includes:    Lipitor 80 Mg Tabs (Atorvastatin calcium) .Marland Kitchen... At  bedtime    Fenofibrate 160 Mg Tabs (Fenofibrate) .Marland Kitchen... Take one tablet by mouth once daily.  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  gets regular EP studies at [facility]  His updated medication list for this problem includes:    Metoprolol Succinate 50 Mg Xr24h-tab (Metoprolol succinate) .Marland Kitchen... Take 1 tablet by mouth daily.    Aspirin 81 Mg Tbec (Aspirin) .Marland Kitchen... Take one tablet by mouth daily    Amiodarone Hcl 200 Mg Tabs (Amiodarone hcl) .Marland Kitchen... Take 1 tablet daily  Problem # 3:  ATRIAL FIBRILLATION (ICD-427.31) HR maintained  His updated medication list for this problem includes:    Metoprolol Succinate 50 Mg Xr24h-tab (Metoprolol succinate) .Marland Kitchen... Take 1 tablet by mouth daily.    Aspirin 81 Mg Tbec (Aspirin) .Marland Kitchen... Take one tablet by mouth daily    Amiodarone Hcl 200 Mg Tabs (Amiodarone hcl) .Marland Kitchen... Take 1 tablet daily  Reviewed the following: INR: 2.3 (02/22/2009) Coumadin Dose (weekly): 20 mg (10/22/2009) Prior Coumadin Dose (weekly): 20 mg (10/22/2009) Next Protime: 10/04/2009 (dated on 09/24/2009)  Problem # 4:  HYPERTENSION (ICD-401.9) controlled  His updated medication list for this problem includes:    Enalapril Maleate 20 Mg Tabs (Enalapril maleate) .Marland Kitchen... 1 tab two times a day    Metoprolol Succinate 50 Mg Xr24h-tab (Metoprolol succinate) .Marland Kitchen... Take 1 tablet by mouth daily.  BP today: 123/76 Prior BP: 128/72 (04/27/2010)  Labs Reviewed: K+: 4.3 (04/27/2010) Creat: : 1.9 (04/27/2010)   Chol: 156 (12/23/2009)   HDL: 31 (12/23/2009)   LDL: 103 (12/23/2009)   TG: 111 (12/23/2009)  Problem # 5:  Preventive Health Care (ICD-V70.0) had flu, TDAP and pneumovac hemoccult cards and colonoscopy referral  Complete Medication List: 1)  Lipitor 80 Mg Tabs (Atorvastatin calcium) .... At bedtime 2)  Nexium 40 Mg Cpdr (Esomeprazole  magnesium) .... Once daily 3)  Enalapril Maleate 20 Mg Tabs (Enalapril maleate) .Marland Kitchen.. 1 tab two times a day 4)  Metoprolol Succinate 50 Mg Xr24h-tab (Metoprolol succinate) .... Take 1 tablet by mouth daily. 5)  Fenofibrate 160 Mg Tabs (Fenofibrate) .... Take one tablet by mouth once daily. 6)  Nitrostat 0.4 Mg Subl (Nitroglycerin) .Marland Kitchen.. 1 tablet under tongue at onset of chest pain; you may repeat every 5 minutes for up to 3 doses. 7)  Voltaren 0.1 % Soln (Diclofenac sodium) .... As needed 8)  Ranitidine Hcl 150 Mg Tabs (Ranitidine hcl) .... Take one tablet by mouth once daily. 9)  Aspirin 81 Mg Tbec (Aspirin) .... Take one tablet by mouth daily 10)  Pradaxa 150 Mg Caps (Dabigatran etexilate mesylate) .... One by mouth two times a day 11)  Amiodarone Hcl 200 Mg Tabs (Amiodarone hcl) .... Take 1 tablet daily 12)  Nexium 40 Mg Cpdr (Esomeprazole magnesium) .Marland Kitchen.. 1 tab once daily  Other Orders: Gastroenterology Referral (GI) T-Hemoccult Card-Multiple (take home) (04540)  Patient Instructions: 1)  Please schedule a follow-up appointment in 6 months.   Orders Added: 1)  Gastroenterology Referral [GI] 2)  T-Hemoccult Card-Multiple (take home) [82270] 3)  Est. Patient Level IV [[address]]    Prevention & Chronic Care Immunizations   Influenza vaccine: Fluvax 3+  (02/18/2010)   Influenza vaccine deferral: Deferred  (12/23/2009)   Influenza vaccine due: 01/21/2011    Tetanus booster: Not documented   Td booster deferral: Deferred  (06/25/2009)   Tetanus booster due: 06/23/2015    Pneumococcal vaccine: Pneumovax  (02/24/2008)   Pneumococcal vaccine deferral: Deferred  (12/29/2008)    H. zoster vaccine: Not documented   H. zoster vaccine deferral: Not available  (06/25/2009)  Colorectal Screening   Hemoccult: Not documented   Hemoccult action/deferral: Ordered  (06/08/2010)    Colonoscopy: Not documented   Colonoscopy action/deferral: GI referral  (06/08/2010)  Other Screening   PSA:  Not documented   PSA action/deferral: Discussed-PSA declined  (12/23/2009)   Smoking status: quit  (06/08/2010)  Lipids   Total Cholesterol: 156  (12/23/2009)   Lipid panel action/deferral: Lipid Panel ordered   LDL: 103  (12/23/2009)   LDL Direct: Not documented   HDL: 31  (12/23/2009)   Triglycerides: 111  (12/23/2009)    SGOT (AST): 30  (04/27/2010)   SGPT (ALT): 33  (04/27/2010)   Alkaline phosphatase: 51  (04/27/2010)   Total bilirubin: 0.3  (04/27/2010)    Lipid flowsheet reviewed?: Yes   Progress toward LDL goal: Unchanged  Hypertension   Last Blood Pressure: 123 / 76  (06/08/2010)   Serum creatinine: 1.9  (04/27/2010)   Serum potassium 4.3  (04/27/2010)    Hypertension flowsheet reviewed?: Yes   Progress toward BP goal: Unchanged  Self-Management Support :   Personal Goals (by the next clinic visit) :      Personal blood pressure goal: 130/80  (06/25/2009)   Hypertension self-management support: Written self-care plan  (06/08/2010)   Hypertension self-care plan printed.    Lipid self-management support: Not documented    Nursing Instructions: GI referral for screening colonoscopy (see order) Provide Hemoccult cards with instructions (see order)

## 2010-06-23 NOTE — Cardiovascular Report (Signed)
Summary: Office Visit   Office Visit   Imported By: Roderic Ovens 05/05/2010 11:53:39  _____________________________________________________________________  External Attachment:    Type:   Image     Comment:   External Document

## 2010-06-24 NOTE — Letter (Signed)
Summary: [facility] Office Note   [facility] Note   Imported By: Roderic Ovens 07/21/2009 14:34:36  _____________________________________________________________________  External Attachment:    Type:   Image     Comment:   External Document

## 2010-06-27 ENCOUNTER — Telehealth: Payer: Self-pay | Admitting: Internal Medicine

## 2010-07-07 NOTE — Progress Notes (Signed)
Summary: pt req samples of pradxa  Phone Note Call from Patient   Caller: Patient [phone] Reason for Call: Talk to Nurse Summary of Call: pt requesting pradaxa samples-wants 30 waiting on mail order/ [facility] [place] [facility] [place] if need to call in rx Initial call taken by: Glynda Jaeger,  June 27, 2010 10:43 AM  Follow-up for Phone Call        pt picked up samples Follow-up by: Laurance Flatten CMA,  June 29, 2010 4:51 PM

## 2010-07-11 ENCOUNTER — Other Ambulatory Visit: Payer: Self-pay | Admitting: *Deleted

## 2010-07-12 MED ORDER — DABIGATRAN ETEXILATE MESYLATE 150 MG PO CAPS: 150.0000 mg | ORAL_CAPSULE | Freq: Two times a day (BID) | ORAL | Status: DC

## 2010-07-12 NOTE — Telephone Encounter (Signed)
Faxed to [facility].

## 2010-07-18 ENCOUNTER — Encounter: Payer: Self-pay | Admitting: Internal Medicine

## 2010-07-19 ENCOUNTER — Ambulatory Visit: Payer: Self-pay | Admitting: Gastroenterology

## 2010-07-19 ENCOUNTER — Encounter (INDEPENDENT_AMBULATORY_CARE_PROVIDER_SITE_OTHER): Payer: Self-pay | Admitting: *Deleted

## 2010-07-28 NOTE — Letter (Signed)
Summary: Appointment - Reschedule  [facility], Main Office  [address]   [place], [place] [address]   Phone: [phone]  Fax: [phone]     July 19, 2010 MRN: [id number]   [facility] [name] [place], [place]  [address]   Dear [name],   Due to a change in our office schedule, your appointment on  07-28-10  at  10:30a              must be changed.  It is very important that we reach you to reschedule this appointment. We look forward to participating in your health care needs. Please contact us at the number listed above at your earliest convenience to reschedule this appointment.     Sincerely,  Glass blower/designer

## 2010-08-06 LAB — CBC
HCT: 39 % (ref 39.0–52.0)
HCT: 40.8 % (ref 39.0–52.0)
Hemoglobin: 13.1 g/dL (ref 13.0–17.0)
MCH: 30.5 pg (ref 26.0–34.0)
MCV: 91.3 fL (ref 78.0–100.0)
MCV: 91.4 fL (ref 78.0–100.0)
Platelets: 262 10*3/uL (ref 150–400)
RBC: 4.27 MIL/uL (ref 4.22–5.81)
RBC: 4.47 MIL/uL (ref 4.22–5.81)
WBC: 9.3 10*3/uL (ref 4.0–10.5)

## 2010-08-06 LAB — COMPREHENSIVE METABOLIC PANEL
Alkaline Phosphatase: 43 U/L (ref 39–117)
BUN: 15 mg/dL (ref 6–23)
Chloride: 105 mEq/L (ref 96–112)
Glucose, Bld: 136 mg/dL — ABNORMAL HIGH (ref 70–99)
Potassium: 4.1 mEq/L (ref 3.5–5.1)
Total Bilirubin: 0.7 mg/dL (ref 0.3–1.2)

## 2010-08-06 LAB — BASIC METABOLIC PANEL
BUN: 14 mg/dL (ref 6–23)
CO2: 26 mEq/L (ref 19–32)
Chloride: 105 mEq/L (ref 96–112)
Chloride: 106 mEq/L (ref 96–112)
Creatinine, Ser: 1.67 mg/dL — ABNORMAL HIGH (ref 0.4–1.5)
GFR calc Af Amer: 50 mL/min — ABNORMAL LOW (ref >60)
GFR calc Af Amer: 50 mL/min — ABNORMAL LOW (ref >60)
GFR calc Af Amer: 51 mL/min — ABNORMAL LOW (ref >60)
GFR calc non Af Amer: 41 mL/min — ABNORMAL LOW (ref >60)
GFR calc non Af Amer: 41 mL/min — ABNORMAL LOW (ref >60)
Glucose, Bld: 125 mg/dL — ABNORMAL HIGH (ref 70–99)
Potassium: 4.3 mEq/L (ref 3.5–5.1)
Potassium: 4.6 mEq/L (ref 3.5–5.1)
Sodium: 140 mEq/L (ref 135–145)
Sodium: 141 mEq/L (ref 135–145)

## 2010-08-06 LAB — URINALYSIS, ROUTINE W REFLEX MICROSCOPIC
Glucose, UA: NEGATIVE mg/dL
Ketones, ur: NEGATIVE mg/dL
Nitrite: NEGATIVE
Protein, ur: NEGATIVE mg/dL

## 2010-08-06 LAB — POCT CARDIAC MARKERS
CKMB, poc: 6.3 ng/mL (ref 1.0–8.0)
Myoglobin, poc: 148 ng/mL (ref 12–200)

## 2010-08-06 LAB — CARDIAC PANEL(CRET KIN+CKTOT+MB+TROPI)
CK, MB: 11.1 ng/mL (ref 0.3–4.0)
CK, MB: 8.1 ng/mL (ref 0.3–4.0)
Total CK: 356 U/L — ABNORMAL HIGH (ref 7–232)
Troponin I: 4.5 ng/mL (ref 0.00–0.06)

## 2010-08-06 LAB — DIFFERENTIAL
Eosinophils Absolute: 0.4 10*3/uL (ref 0.0–0.7)
Eosinophils Relative: 4 % (ref 0–5)
Eosinophils Relative: 6 % — ABNORMAL HIGH (ref 0–5)
Lymphocytes Relative: 25 % (ref 12–46)
Lymphs Abs: 1.3 10*3/uL (ref 0.7–4.0)
Lymphs Abs: 2.3 10*3/uL (ref 0.7–4.0)
Monocytes Absolute: 0.6 10*3/uL (ref 0.1–1.0)
Monocytes Absolute: 0.8 10*3/uL (ref 0.1–1.0)
Monocytes Relative: 8 % (ref 3–12)
Monocytes Relative: 9 % (ref 3–12)
Neutro Abs: 5.8 10*3/uL (ref 1.7–7.7)

## 2010-08-06 LAB — LIPID PANEL
LDL Cholesterol: 94 mg/dL (ref 0–99)
VLDL: 24 mg/dL (ref 0–40)

## 2010-08-06 LAB — MAGNESIUM: Magnesium: 1.9 mg/dL (ref 1.5–2.5)

## 2010-08-06 LAB — TROPONIN I: Troponin I: 0.36 ng/mL — ABNORMAL HIGH (ref 0.00–0.06)

## 2010-08-06 LAB — MRSA PCR SCREENING: MRSA by PCR: NEGATIVE

## 2010-08-06 LAB — PROTIME-INR: Prothrombin Time: 16.5 seconds — ABNORMAL HIGH (ref 11.6–15.2)

## 2010-08-06 LAB — CK TOTAL AND CKMB (NOT AT ARMC): CK, MB: 7.9 ng/mL (ref 0.3–4.0)

## 2010-08-10 ENCOUNTER — Ambulatory Visit (INDEPENDENT_AMBULATORY_CARE_PROVIDER_SITE_OTHER): Payer: Medicare PPO | Admitting: *Deleted

## 2010-08-10 DIAGNOSIS — I428 Other cardiomyopathies: Secondary | ICD-10-CM

## 2010-08-15 ENCOUNTER — Other Ambulatory Visit: Payer: Self-pay | Admitting: *Deleted

## 2010-08-15 DIAGNOSIS — I428 Other cardiomyopathies: Secondary | ICD-10-CM

## 2010-08-15 MED ORDER — HYDROXYZINE HCL 50 MG PO TABS: 50.0000 mg | ORAL_TABLET | Freq: Three times a day (TID) | ORAL | Status: DC | PRN

## 2010-08-29 ENCOUNTER — Encounter: Payer: Self-pay | Admitting: Gastroenterology

## 2010-08-29 ENCOUNTER — Ambulatory Visit (INDEPENDENT_AMBULATORY_CARE_PROVIDER_SITE_OTHER): Payer: Medicare PPO | Admitting: Gastroenterology

## 2010-08-29 VITALS — BP 128/74 | HR 62 | Ht 69.0 in | Wt 200.0 lb

## 2010-08-29 DIAGNOSIS — Z1211 Encounter for screening for malignant neoplasm of colon: Secondary | ICD-10-CM

## 2010-08-29 MED ORDER — PEG-KCL-NACL-NASULF-NA ASC-C 100 G PO SOLR: 1.0000 | Freq: Once | ORAL | Status: AC

## 2010-08-29 NOTE — Assessment & Plan Note (Signed)
Patient will be scheduled for screening colonoscopy. Pradaxa will be held if approved by his PCP

## 2010-08-29 NOTE — Progress Notes (Signed)
History of Present Illness:  [name] is a pleasant, 65 year old, African American male with sleep apnea, cardiomyopathy, history of CVA, atrial fibrillation, on pradaxa,  referred at the request of Dr. Scot Dock for screening colonoscopy. He has no specific GI complaints. Family history is negative for colon cancer.    Review of Systems: He has mild dyspnea on exertion. Pertinent positive and negative review of systems were noted in the above HPI section. All other review of systems were otherwise negative.    Current Medications, Allergies, Past Medical History, Past Surgical History, Family History and Social History were reviewed in [employer] electronic medical record  Vital signs were reviewed in today's medical record. Physical Exam: General: Well developed , well nourished, no acute distress Head: Normocephalic and atraumatic Eyes:  sclerae anicteric, EOMI Ears: Normal auditory acuity Mouth: No deformity or lesions Lungs: Clear throughout to auscultation Heart: Regular rate and rhythm;no rubs or bruits; 1-2/6 early systolic murmur at left sternal border Abdomen: Soft, non tender and non distended. No masses, hepatosplenomegaly or hernias noted. Normal Bowel sounds Rectal:deferred Musculoskeletal: Symmetrical with no gross deformities  Pulses:  Normal pulses noted Extremities: No clubbing, cyanosis, edema or deformities noted Neurological: Alert oriented x 4, grossly nonfocal Psychological:  Alert and cooperative. Normal mood and affect

## 2010-08-29 NOTE — Patient Instructions (Signed)
Colonoscopy A colonoscopy is an exam to evaluate your entire colon. In this exam, your colon is cleansed. A long fiberoptic tube is inserted through your rectum and into your colon. The fiberoptic scope (endoscope) is a long bundle of enclosed and very flexible fibers. These fibers transmit light to the area examined and send images from that area to your caregiver. Discomfort is usually minimal. You may be given a drug to help you sleep (sedative) during or prior to the procedure. This exam helps to detect lumps (tumors), polyps, inflammation, and areas of bleeding. Your caregiver may also take a small piece of tissue (biopsy) that will be examined under a microscope. BEFORE THE PROCEDURE  A clear liquid diet may be required for 2 days before the exam.   Liquid injections (enemas) or laxatives may be required.   A large amount of electrolyte solution may be given to you to drink over a short period of time. This solution is used to clean out your colon.   You should be present 1  prior to your procedure or as directed by your caregiver.   Check in at the admissions desk to fill out necessary forms if not preregistered. There will be consent forms to sign prior to the procedure. If accompanied by friends or family, there is a waiting area for them while you are having your procedure.  LET YOUR CAREGIVER KNOW ABOUT:  Allergies to food or medicine.  Medicines taken, including vitamins, herbs, eyedrops, over-the-counter medicines, and creams.   Use of steroids (by mouth or creams).   Previous problems with anesthetics or numbing medicines.   History of bleeding problems or blood clots.  Previous surgery.   Other health problems, including diabetes and kidney problems.   Possibility of pregnancy, if this applies.   AFTER THE PROCEDURE  If you received a sedative and/or pain medicine, you will need to arrange for someone to drive you home.   Occasionally, there is a little blood passed  with the first bowel movement. DO NOT be concerned.  HOME CARE INSTRUCTIONS  It is not unusual to pass moderate amounts of gas and experience mild abdominal cramping following the procedure. This is due to air being used to inflate your colon during the exam. Walking or a warm pack on your belly (abdomen) may help.   You may resume all normal meals and activities after sedatives and medicines have worn off.   Only take over-the-counter or prescription medicines for pain, discomfort, or fever as directed by your caregiver. DO NOT use aspirin or blood thinners if a biopsy was taken. Consult your caregiver for medicine usage if biopsies were taken.  FINDING OUT THE RESULTS OF YOUR TEST Not all test results are available during your visit. If your test results are not back during the visit, make an appointment with your caregiver to find out the results. Do not assume everything is normal if you have not heard from your caregiver or the medical facility. It is important for you to follow up on all of your test results. SEEK IMMEDIATE MEDICAL CARE IF:  You pass large blood clots or fill a toilet with blood following the procedure. This may also occur 10 to 14 days following the procedure. This is more likely if a biopsy was taken.   You develop abdominal pain that keeps getting worse and cannot be relieved with medicine.  Document Released: 05/05/2000 Document Re-Released: 08/02/2009 [facility] Patient Information 2011 [place], [place]. Your Colonoscopy is scheduled on 09/23/2010  at 11:30am Your MoviPrep has been sent to your pharmacy We will contact Dr Carmon Ginsberg regarding your Pradaxa

## 2010-09-07 ENCOUNTER — Telehealth: Payer: Self-pay | Admitting: Internal Medicine

## 2010-09-07 NOTE — Telephone Encounter (Signed)
I talked with pt's wife. Pt scheduled for colonoscopy by Dr Arlyce Dice 09/23/10. Pt's wife asking if OK to be off Pradaxa for colon and if OK how long should he hold Pradaxa for the colon. I will forward to Dr Johney Frame for review.

## 2010-09-08 NOTE — Telephone Encounter (Signed)
Pt to hold three doses of pradaxa prior to colonoscopy and resume pradaxa 24 hours after the procedure.

## 2010-09-09 NOTE — Telephone Encounter (Signed)
Wife aware and will do as instructed

## 2010-09-12 ENCOUNTER — Encounter: Payer: Self-pay | Admitting: *Deleted

## 2010-09-12 NOTE — Progress Notes (Signed)
SEE DOCUMENTATION BELOW FOR HOLDING PRADAXA.Marland Kitchenthe patient AWARE Dennis Bast, RN 09/09/2010 12:47 PM Signed  Wife aware and will do as instructed Hillis Range, MD 09/08/2010 5:25 PM Signed  Pt to hold three doses of pradaxa prior to colonoscopy and resume pradaxa 24 hours after the procedure. Katina Dung, RN 09/07/2010 4:38 PM Signed  I talked with pt's wife. Pt scheduled for colonoscopy by Dr Arlyce Dice 09/23/10. Pt's wife asking if OK to be off Pradaxa for colon and if OK how long should he hold Pradaxa for the colon. I will forward to Dr Johney Frame for review.

## 2010-09-20 ENCOUNTER — Telehealth: Payer: Self-pay | Admitting: Internal Medicine

## 2010-09-20 NOTE — Telephone Encounter (Signed)
Pt having a colonoscopy on Friday.  Does pt need to stop asa as well.

## 2010-09-20 NOTE — Telephone Encounter (Signed)
No I don't think he needs to stop ASA  But will need to make sure its okay with GI doctor  Spoke with Marylene Land  nd she is aware

## 2010-09-22 ENCOUNTER — Encounter: Payer: Self-pay | Admitting: Gastroenterology

## 2010-09-23 ENCOUNTER — Encounter: Payer: Self-pay | Admitting: Gastroenterology

## 2010-09-23 ENCOUNTER — Ambulatory Visit (AMBULATORY_SURGERY_CENTER): Payer: Medicare PPO | Admitting: Gastroenterology

## 2010-09-23 VITALS — BP 150/86 | HR 56 | Temp 98.6°F | Resp 17 | Ht 69.0 in | Wt 204.0 lb

## 2010-09-23 DIAGNOSIS — Z1211 Encounter for screening for malignant neoplasm of colon: Secondary | ICD-10-CM

## 2010-09-23 MED ORDER — SODIUM CHLORIDE 0.9 % IV SOLN: 500.0000 mL | INTRAVENOUS | Status: DC

## 2010-09-23 NOTE — Patient Instructions (Signed)
Resume pradaxa today.

## 2010-09-26 ENCOUNTER — Telehealth: Payer: Self-pay | Admitting: *Deleted

## 2010-09-26 NOTE — Telephone Encounter (Signed)
Tried to call both work and cell numbers; unable to reach patient.

## 2010-09-27 ENCOUNTER — Other Ambulatory Visit: Payer: Self-pay | Admitting: *Deleted

## 2010-09-27 ENCOUNTER — Telehealth: Payer: Self-pay | Admitting: Gastroenterology

## 2010-09-27 MED ORDER — DABIGATRAN ETEXILATE MESYLATE 150 MG PO CAPS: 150.0000 mg | ORAL_CAPSULE | Freq: Two times a day (BID) | ORAL | Status: DC

## 2010-09-27 NOTE — Telephone Encounter (Signed)
Wife states that the pt saw a little bit of blood in the toilet this morning when he had a BM. Pt had a colon last week and was concerned. Let her know it was ok if there was only a little bit. Instructed her to let us know if it continued or increased any. She verbalized understanding.

## 2010-09-29 ENCOUNTER — Telehealth: Payer: Self-pay | Admitting: Internal Medicine

## 2010-09-29 ENCOUNTER — Other Ambulatory Visit: Payer: Self-pay | Admitting: *Deleted

## 2010-09-29 DIAGNOSIS — I428 Other cardiomyopathies: Secondary | ICD-10-CM

## 2010-09-29 NOTE — Telephone Encounter (Signed)
Pt needs pradaxa to be call in to [facility]/[place] # [phone]

## 2010-09-30 MED ORDER — RANITIDINE HCL 150 MG PO TABS: 150.0000 mg | ORAL_TABLET | Freq: Every day | ORAL | Status: DC

## 2010-09-30 NOTE — Telephone Encounter (Signed)
Rx for Pradaxa called into local pharmacy.  Judithe Modest, CMA

## 2010-10-04 NOTE — Discharge Summary (Signed)
NAMEHILLERY, BHALLA NO.:  [id number]   MEDICAL RECORD NO.:  [id number]          PATIENT TYPE:  INP   LOCATION:  5154                         FACILITY:  [facility]   PHYSICIAN:  Tacey Ruiz, MD    DATE OF BIRTH:  [birth year]   DATE OF ADMISSION:  09/06/2007  DATE OF DISCHARGE:  09/09/2007                               DISCHARGE SUMMARY   DISCHARGE DIAGNOSES:  1. Dysphagia likely secondary to subacute stroke.  2. Left frontotemporal insular cerebrovascular accident, subacute.  3. Left vocal cord paralysis.  4. Coronary artery disease.  5. Hoarseness.  6. 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  7. Hypersomnia.  8. Hypertension.   DISCHARGE MEDICATIONS:  1. Lipitor 80 mg nightly.  2. Nexium 40 mg daily.  3. Captopril 25 mg b.i.d.  4. Aspirin 81 mg daily.  5. Lopressor 50 mg b.i.d.  6. MiraLax 17 g as needed.  7. Claritin daily.   DISPOSITION AND FOLLOWUP:  The patient will followup at [facility], the number [phone], September 19, 2007, at 4 p.m.  The  patient will need no labs at this followup appointment.  However, the  patient will need a followup CTA of the head and neck at approximately 1  month to evaluate the left frontotemporal insular stroke and make sure  that this is in fact a subacute stroke and not a mass, which is  increasing in size.  We will refer to the outpatient clinic to arrange  for the scan.  The scan date will need to be approximately Oct 04, 2007,  to October 21, 2007, somewhere at that time.  The patient now will continue  to follow up with Dr. Graciela Husbands and his cardiologist as needed.  The patient  is also seen by Dr. Sharren Bridge and ear, nose, throat doctors in [facility]  on an p.r.n. basis.   BRIEF HISTORY OF PRESENT ILLNESS:  Mr. Roethler is a 65 year old African  American male with history dysphagia, coronary artery disease, dilated  cardiomyopathy, status post AICD, obstructive sleep apnea, and  hypertension who initially had a pneumonia occurring, it beginning of  March 2009.  One week after this pneumonia, the patient develop some  mild left-sided hearing loss, which resolved after 1 week.  About a week  after this hearing loss resolved, the patient began to have hoarseness  and this hoarseness lasted throughout the month of March and at the end  of March, the patient began to have some dysphagia.  The patient's  dysphagia worsen over the end of March 2009 into the beginning of April  2009 and got to the point where the patient was unable to swallow his  own secretions.  The patient was admitted to [facility] from August 28, 2007 to August 30, 2007, for workup of this  dysphagia.  During that workup, the patient had a EGD by Dr. Piedad Climes,  which was normal.  Dilation was performed and the patient has a  esophageal biopsies performed, which showed benign squamous epithelium.  The  patient has a barium swallow done, which was within normal limits  and showed no aspiration.  He has had a CT head done that was normal on  enhanced CT for age.  A CT neck done, which showed a left vocal cord  paralysis and some soft tissue fullness in the larynx, and the CT chest  done which showed no masses or lesions, and somehow what looked to be  benign liver cysts.  The patient also had a direct laryngoscopy done by  his primary otolaryngologist, which showed the left vocal cord paralysis  and no obvious masses.  The patient was discharged from [facility] on August 30, 2007, with improved dysphagia.  The patient was  tolerating liquid at that point and discharged him with some swallowing  exercises to be done in the outpatient setting.  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  The patient has had  multiple  dilations in the past of his esophagus, most recently done in  2004.  The patient presented to [facility] on day of  admission for routine followup.  The patient stated that this hoarseness  was still present and the dysphagia was present as well.  It seemed to  be improving over the time since hospital discharge from [facility], but of note the patient's wife noted he had some memory loss  over the past 3 or 4 days.  The patient notes that his dysphagia is  worse at the level of the cricoid and feels that the foods gets caught  and causes him to vomit it back up, so he does not choke.  The patient  states he has not had any solid food over the past 16 days, but he is  tolerating liquids well.  The patient was admitted to [facility].  On the day of admission, on further neurologic evaluation,  planned MRI of the brain, which has not been done at the outside  hospital.   ALLERGIES:  Codeine.   PAST MEDICAL HISTORY:  See as dictated above.   ADMISSION MEDICATIONS:  1. Lipitor 80 mg nightly.  2. Nexium 40 mg daily.  3. Captopril 25 mg b.i.d.  4. Aspirin 81 mg daily.  5. Lopressor 50 mg b.i.d.  6. MiraLax 17 g as needed.  7. Claritin daily.   SOCIAL HISTORY:  The patient is a former smoker.  He quit 20 years ago.  Denies alcohol or drug use.  He is married, but retired, lives in  [place].   REVIEW OF SYSTEMS:  Negative except as noted in the HPI.   FAMILY HISTORY:  Negative for throat or laryngeal cancer.   PHYSICAL EXAMINATION:  VITAL SIGNS:  Vitals on admission, temperature  97.8, blood pressure 156/83, pulse 85, respiratory rate 16, sating 98%  on room air, weight was 176 pounds.  The patient is orthostatic,  rechecks were negative.  GENERAL:  The patient is in no acute distress, alert and oriented x3  with a hoarse sounding voice.  HEENT:  Eyes, pupils equal, round and reactive to light and  accommodation.   Extraocular movements are intact.  Anicteric.  ENT,  mucous membranes moist.  No oropharyngeal erythema or exudate.  NECK:  Supple.  No thyromegaly.  There is possibly some left-sided  fullness at the level of the cricoid,  which is not tender to palpation,  but does coincide with where the patient's feels foods get stuck.  RESPIRATORY:  Clear to auscultation bilaterally without upper airway  noise, without stridor.  No wheezes, rubs, or rhonchi.  CARDIOVASCULAR:  Regular rate and rhythm.  No murmurs, rubs, or gallops.  GI:  Soft, nontender.  Positive bowel sounds.  EXTREMITIES:  No clubbing, cyanosis or edema.  SKIN:  No rashes.  No bruise.  LYMPH:  No lymphadenopathy.  MUSCULOSKELETAL:  Normal strength and tone.  NEURO:  Cranial nerve II through XII intact.  Upper extremity and lower  extremity strength 5/5 and symmetric.  Sensation intact to light touch.  DTRs is 2+ and symmetric.  Finger-to-nose intact bilaterally.  Negative  Babinski.  PSYCH:  Appropriate.   LABORATORIES ON ADMISSION:  CBC; white count 8.5, hemoglobin 15, and  platelets 269.  BMP; sodium 135, potassium 3.7, chloride 99, bicarb 28,  BUN 15, creatinine 1.3, glucose 99, calcium 9.3, TSH is normal at 1.09,  T4 normal at 1.15, and a B12 level was normal at 554.   IMAGES AND PLATE:  CT head and neck without contrast September 07, 2007.  Impression of CT head 3-cm region of abnormal brain, no left frontal  anterior insular region contrast enhanced CT suggested could represent  stroke.  CT of the neck impression, negative on enhanced CT of the neck,  emphysema noted in lung apices.  CTA of the head and neck September 09, 2007, CTA head impression, 2.3 x 2.2 hyperdensity in the left  frontotemporal insular.  It is unchanged prior to study.  He has mild  vascular type enhancement most likely subacute infarct.  Followup CT in  3-4 weeks is recommended.  No evidence of intracranial atherosclerosis.  CTA neck impression, there is  asymmetry of the larynx, this may be due  to paralysis of the vocal cord and less likely a massive lesion.  Direct  laryngoscope was suggested given this patient's symptoms of hoarseness.  The study is negative for carotid or vertebral artery stenosis in the  back.   HOSPITAL COURSE:  1. Dysphagia.  The patient seem to have had a thorough workup at the      [facility] approximately 1 week prior to      admission studies including EGD, CT of the head, neck, and chest,      which were all negative and direct laryngoscopy of barium swallow,      which was normal and direct laryngoscopy by his eyes, ear, nose,      and throat doctor.  The patient has significant improving, but      symptoms are not resolved with the questionable memory loss.  I      felt the patient may benefit from an MRI exam to rule out a stroke.      The patient's physical examination had no focal findings on a      complete neurologic examination, now he felt that any stroke, which      was occurring would likely subacute.  The patient initially had an      MRI ordered of the brain and neck, but this was unable to be      performed secondary to the patient's AICD.  The old scans from [facility] were obtained as they could be processed      with the radiologist here and a repeat CT of the head and  neck were      performed.  The CT on September 07, 2007, showed 3-cm region of      abnormal brain in the left frontal anterior insular region, which      could represent a stroke or mass.  On CT of the neck on the same      day was negative on enhanced CT of the neck.  Because of these      studies, Neurology was called.  They came and evaluated the patient      and it was felt the patient would benefit from the CT angiogram of      both head and neck to investigate further this insular lesion and      investigate for carotid stenosis.   The recommendations were to continue aspirin and  Lipitor as well.  The  CTA of the head and neck were performed on September 09, 2007.   The impressions were as follows; CT of the head showed a 2.3 x 2.2  hyperdense left frontotemporal insular lesion unchanged from prior  study, it appears to be vascular type enhancement.  This most likely a  subacute stroke.  Recommended CT in 3-4 weeks is suggested to exclude a  massive lesion.  There is no evidence of intracranial atherosclerosis.  CTA of the neck showed some asymmetric larynx, maybe due to paralysis of  the vocal cord, most likely a massive lesion.  Direct laryngoscopy  suggested secondary to patient's symptoms of course.  With these  studies, the patient also was negative for carotid or vertebra artery  stenosis in the neck.  With these studies, I felt that the patient could  discharged home and Speech was consulted to see the patient.  It is felt  that he had a mild dysphagia and felt most likely was due to esophageal  motility.  Since a recent EGD had been done and the patient now felt  like his symptoms were improving, we are just not to reconsult GI,  should symptoms continue and without improvement for a few more weeks,  had follow up in the outpatient clinic EGD can be considered.  We will  also repeat the CTA in the future as recommended.  The patient was able  to tolerate ground puree-type foods, which is a improvement over the  past few weeks at the time of discharge.  1. Left vocal cord paralysis likely secondary to subacute insular      stroke.  Continue to follow for improvement with repeat CTA in      approximately 1 month.  There was no laryngeal masses, which would      likely cause this.  2. Hypertension.  The patient will remain on same blood pressure      medications.  His blood pressure remained under good control.  3. Hyperlipidemia.  The patient will continue on Lipitor.  CTA of the      carotids and vertebral artery showed no atherosclerosis.   DISCHARGE LABS  AND VITALS:  Temperature 90.3, pulse 60, respiratory rate  20, blood pressure 140/73, sat 98% on room air.  Labs; a BMP shows a  glucose of 138, potassium 4.1, chloride 105, bicarb 26, glucose 92, BUN  10, creatinine 1.4, and calcium of 9.4.   CULTURES:  None.   CONSULTS:  [facility].      Tacey Ruiz, MD  Electronically Signed     JP/MEDQ  D:  09/09/2007  T:  09/10/2007  Job:  [id number] 

## 2010-10-04 NOTE — Consult Note (Signed)
NAMESOTA, HETZ NO.:  [id number]   MEDICAL RECORD NO.:  [id number]          PATIENT TYPE:  INP   LOCATION:  4713                         FACILITY:  [facility]   PHYSICIAN:  Bevelyn Buckles. Bensimhon, MDDATE OF BIRTH:  1945-12-24   DATE OF CONSULTATION:  07/19/2007  DATE OF DISCHARGE:                                 CONSULTATION   PRIMARY CARDIOLOGIST:  Dr. Tomie China in [place].   ELECTROPHYSIOLOGIST:  Dr. Sherryl Manges.   PATIENT PROFILE:  A 65 year old African American male, prior history of  CAD and nonischemic cardiomyopathy status post ICD, who presents with 2-  day history of fever, malaise, chest congestion, cough and one brief  episode chest pain.   PROBLEMS:  1. Left lingular pneumonia diagnosed here in the emergency department.  2. Coronary artery disease.      a.     Status post myocardial infarction in 1986 with percutaneous       coronary intervention of the left circumflex.      b.     Cardiac catheterization 2005 revealing an patent vessels       with an ejection fraction of 25%.  3. Nonischemic cardiomyopathy/chronic systolic congestive heart      failure.  MUGA scan in 2005 with ejection fraction of 47%.  4. Nonsustained ventricular tachycardia/history of syncope.      a.     Status post positive electrophysiology study in September       2005.      b.     February 11, 2004, status post placement of a Guidant       Vitality DS model T125DR, serial number [id number] dual-chamber       automated implantable cardioverter defibrillator.  5. Hypertension.  6. Hyperlipidemia.  7. Chronic itching.  8. Gastroesophageal reflux disease.   HISTORY OF PRESENT ILLNESS:  A 65 year old married African American male  with prior history of CAD, MI, nonischemic cardiomyopathy, syncope and  nonsustained VT status post ICD in September 2005.  He is fairly active  at home without shortness of breath or chest discomfort and denies any  prior history of orthopnea, PND  or edema.  Approximately 2 days ago he  began to experience chest congestion, cough, malaise and chest achiness,  worse with coughing and deep breathing along with upper body movements.  He was seen in an urgent care and told that he might have strep throat  but that they would not prescribe him anything because of his heart  history.  He therefore continued without any sort of therapy and  symptoms worsened over the past 24 hours including fevers and chills  starting last night and into this morning.  Also this morning, he was  lying on his couch when he had an episode of 7/10 sharp, shooting chest  discomfort for which he took a nitroglycerin.  The symptoms resolved in  about a minute or so.  Because of the constellation of symptoms with now  fever and chills he presented to the [facility] ED.  Here his  temperature was 101.5 and his chest x-ray showed lingular infiltrate and  problem pneumonia.  He has already been treated with 1 gram of Rocephin,  500 of p.o. Zithromax, and 650 of Tylenol.  He remains somewhat  uncomfortable.  His ECG here shows new T-wave inversions in V4 through  V6 but currently he is pain-free.  Cardiac markers are pending.   ALLERGIES:  CODEINE.   HOME MEDICATIONS:  1. Lipitor 80 mg daily.  2. Hydroxyzine 50 mg daily.  3. Lipofen 150 mg daily.  4. Lopressor 50 mg daily.  5. Captopril 12.5 mg daily.  6. Nexium 40 mg daily.  7. Coricidin.   FAMILY HISTORY:  Mother died at age 43 with a history of CAD and MI.  Father died at 63 with a history of CAD and MI.  He has four brothers  and three sisters.  There is a history of diabetes in his siblings but  no significant history of coronary disease.   SOCIAL HISTORY:  He lives in [place], [place], with his wife  and one child.  He is retired.  He has about a 40-pack-year history of  tobacco abuse, quitting in 1986.  He denies any alcohol or drug use.  He  does not routinely exercise.   REVIEW OF  SYSTEMS:  Positive for fevers, chills and sweats starting last  night.  Positive for one episode of sharp chest pain different from  previous angina, occurring this morning.  He has had chest wall achiness  and generalized malaise with chest congestion, arthralgias over the past  2 days.  He developed some nausea after receiving oral Zithromax this  morning.  Otherwise, all systems reviewed and negative.   PHYSICAL EXAMINATION:  Temperature 101.5, heart rate 79, respirations  20, blood pressure 117/59, pulse oximetry 97% on 2 L.  A pleasant  African American male in no acute distress; awake, alert and oriented  x3.  HEENT:  Normal.  NEUROLOGIC:  Grossly intact and nonfocal.  SKIN:  Warm to the touch.  There are no lesions or masses.  NECK:  No bruits or JVD.  LUNGS:  Respirations regular and unlabored.  Clear to auscultation.  CARDIAC:  Regular, S2, S2.  No S3, S4 or murmurs.  ABDOMEN:  Round, soft, nontender, nondistended.  Bowel sounds x4.  EXTREMITIES:  Warm, dry and pink.  No clubbing, cyanosis or edema.  Dorsalis pedis and posterior tibial pulses 2+ and equal bilaterally.   Chest x-ray shows lingular infiltrate with probable pneumonia.  EKG  shows sinus rhythm at 80 beats per minute, normal axis.  There is ST  depression and T-wave inversion V4 through V6.  Lab work:  Sodium 135,  potassium 4.5, chloride 106, CO2 26.4, BUN 15, creatinine 0.9, glucose  124.  BNP 216.   ASSESSMENT AND PLAN:  1. Chest pain.  The patient had an episode of sharp, shooting chest      pain different from previous angina in the setting of pneumonia.      We recommend treatment of pneumonia and probable admission to      primary care service.  We will follow along to rule out MI.  2. Coronary artery disease.  As above.  Rule out MI.  Would continue      beta blocker, statin and add aspirin.  The patient is scheduled for      a Myoview through Dr. Kem Parkinson office on March 10.  3. Nonischemic  cardiomyopathy.  He is euvolemic currently.  Would      continue his beta blocker.  However, in the  setting of creatinine      of 1.9 hold his ACE inhibitor.  Of note, he is on short-acting      Lopressor being taken daily.  This is per Dr. Tomie China.  He says      that he used to take it more often but that the dose was decreased      because his blood pressures were too low in the past.  Ideally, the      patient should either be on long-acting metoprolol or Carvedilol.      Defer to outpatient management via Dr. Tomie China.  4. Renal insufficiency.  Not known if this is new or not.  The patient      has had decreased p.o. in the past 2 days.      Would hold ACE inhibitor and hydrate.  5. Hyperlipidemia/hypertriglyceridemia.  Continue statin and      fenofibrate.  6. Gastroesophageal reflux disease.  Continue PPI.      [name], ANP      Bevelyn Buckles. Bensimhon, MD  Electronically Signed    CB/MEDQ  D:  07/19/2007  T:  07/20/2007  Job:  [id number]

## 2010-10-04 NOTE — Assessment & Plan Note (Signed)
[facility]                             PULMONARY OFFICE NOTE   [name], [name] [name]                     MRN:          [id number]  DATE:01/24/2007                            DOB:          03-26-1946    HISTORY OF PRESENT ILLNESS:  The patient is a very pleasant 65 year old  black gentleman who I have been asked to see for management of  obstructive sleep apnea.  The patient was initially diagnosed with sleep  apnea back in 2005, where he was found to have an apnea/hypopnea index  of 59 events per hour.  The patient underwent CPAP titration where an  optimum pressure of 18-cmH2O was demonstrated.  The patient was placed  on CPAP and did fairly well with it except for occasional mask issues.  The patient subsequently left the area and moved to [place] and about 1-  2 years ago had a followup sleep study that sounds like a split night  study.  From the patient's description, it sounds as though he was  placed on BiPAP and is using a hybrid mask for this.  The patient has  recently moved back to [name] and does not have a [name] here.  He  is also having great difficulty with his mask.  He typically goes to bed  at 10 p.m. and gets up at 6:45 a.m.  Despite wearing his CPAP, he has  been having breakthrough snoring and does not feel rested in the morning  whenever he arises.  He also admits to significant daytime sleepiness  any time he sits down or tries to watch TV.  He also has occasional  sleepiness with driving.   PAST MEDICAL HISTORY:  Significant for:  1. Hypertension.  2. History of myocardial infarction.  3. History of what sounds like a cardiomyopathy by his history and is      status post pacemaker and defibrillator placement in 2006.  4. History of sleep apnea as stated above.   CURRENT MEDICATIONS:  1. Lipitor 80 mg q.h.s.  2. Nexium 40 mg daily.  3. Captopril 25 mg b.i.d.  4. Enteric coated aspirin 81 mg daily.  5. Lopressor 50  mg daily.  6. NitroQuick p.r.n.   The patient is intolerant/allergic to:  CODEINE.   SOCIAL HISTORY:  He has a history of smoking 3 packs per day for 25  years.  He has not smoked since 1986.  He is married, and he has  children.   FAMILY HISTORY:  Remarkable for mother and father both having heart  disease.   REVIEW OF SYSTEMS:  As per history of present illness, also see the  patient intake form documented in the chart.   PHYSICAL EXAMINATION:  GENERAL:  He is a well developed black male in no  acute distress.  VITAL SIGNS:  Blood pressure is 122/82, pulse 63, temperature is 98.2,  weight is 187 pounds.  He is 5 feet 9 inches tall.  O2 saturation on  room air is 97%.  HEENT:  Pupils are equal, round, and reactive to light and  accommodation.  Extraocular muscles  are intact.  Nares are patent  without discharge.  Oropharynx shows moderate elongation of the soft  palate and uvula.  NECK:  Supple without JVD or lymphadenopathy.  There is no palpable  thyromegaly.  CHEST:  Totally clear to auscultation.  CARDIAC:  Reveals controlled ventricular response.  ABDOMEN:  Soft, nontender with good bowel sounds.  GENITAL/RECTAL/BREASTS:  Not done, not indicated.  LOWER EXTREMITIES:  Without significant edema.  Pulses are intact  distally but diminished.  NEUROLOGIC:  He is alert and oriented.  He moves all four extremities.   IMPRESSION:  History of severe obstructive sleep apnea.   The patient currently sounds as though he is on a BiPAP, and I will need  to get him established with a durable medical equipment [name] and  also establish what kind of machine he is on and at what pressure.  I  have explained to him anytime someone is compliant with a positive  pressure device and they continue to have breakthrough snoring and  persistent sleepiness, various things need to be considered.  The first  is to make sure that the mask is fitting properly and is not leaking.  The second is  that his pressure needs have increased.  And, the last is  that his machine is accurately putting out an appropriate pressure.  More than likely, given his difficulties with the mask, this is probably  the issue.   PLAN:  1. Will get his most recent sleep study sent down from [place] for      documentation.  2. We will set up with a new durable medical equipment company here in      the area.  3. We will send him over to the sleep lab for fitting of a full face      mask rather than a hybrid mask to see if this will help.  4. The patient is to wear the new mask at his current pressure for the      next 2-3 weeks and see how things progress.  If he continues to      have difficulty, we will set him up with a BiPAP autodevice for 2-3      weeks for pressure optimization.  5. The patient will follow up after I hear back from him as to how      things are going with the new mask.     Barbaraann Share, MD,FCCP  Electronically Signed    KMC/MedQ  DD: 01/24/2007  DT: 01/24/2007  Job #: [id number]   cc:   Aundra Dubin. Revankar, M.D.

## 2010-10-04 NOTE — Discharge Summary (Signed)
[name], [name] NO.:  [id number]   MEDICAL RECORD NO.:  [id number]          PATIENT TYPE:  INP   LOCATION:  4713                         FACILITY:  [facility]   PHYSICIAN:  Manning Charity, MD     DATE OF BIRTH:  1945/11/15   DATE OF ADMISSION:  07/19/2007  DATE OF DISCHARGE:  07/21/2007                               DISCHARGE SUMMARY   DISCHARGE DIAGNOSES:  1. Community-acquired pneumonia.  2. Pleuritic chest pain.  3. Ischemic cardiomyopathy with an ejection fraction of 25% per      catheterization in 2005.  4. Status post percutaneous coronary intervention circumflex in 1986.  5. Status post automatic implantable cardioverter-defibrillator      implantation September 2006,  6. Congestive heart failure.  7. History of myocardial infarction.  8. Obstructive sleep apnea on CPAP.   DISCHARGE MEDICATIONS:  1. Lipitor 80 mg one tablet daily.  2. Metoprolol 50 mg one tab twice a day.  3. Captopril 12.5 one tablet daily.  4. TriCor 145 one tablet daily.  5. Avelox 400 mg one tablet daily.  6. Nexium one tab daily.   PROCEDURES PERFORMED:  None.   DISPOSITION/FOLLOWUP:  [name], [name] has an appointment  scheduled with his cardiologist within one month for a stress test. The  Outpatient Clinic will call him for an appointment.  He needs to be  followed for resolution of his pneumonia, his adherence to medications  and the resolution of the chest pain.   HISTORY OF PRESENT ILLNESS:  A 65 year old male with a past medical  history significant for ICA  25% per cath in 2005.  He is status post  PCI circumflex in 1986.  He is status post AICD implantation in 2006.  He presented to the emergency department complaining of dyspnea and  chest pain.  He related that two days prior to admission he started  having increased cough with sputum with no blood, fever with a  temperature of 100.9, and weakness.  He also related one episode of  chest pain that started  after he was coughing.  He describes the pain as  sharp, non-radiating, lasted for minutes, 7/10, and resolved with  nitroglycerin.  He said that the pain was different in quality than the  pain that he had before from a heart attack.   PHYSICAL EXAMINATION:  VITAL SIGNS:  Temperature 101.5, blood pressure  117/59, pulse 79, respirations 20, oxygen saturation 94% on room air.  GENERAL:  The patient is alert and oriented, diaphoretic, in no acute  distress.  HEENT:  Erythematous but no blood, no exudate, no tonsillar enlargement  NECK:  Supple.  No JVD.  No bruits.  No lymphadenopathy.  RESPIRATORY:  Bilateral air movement.  Mild crackles on the left.  CARDIOVASCULAR:  S1 S2 regular rhythm rate.   LABORATORY:  Sodium 139, potassium 4.5, chloride 106, bicarb 26, BUN 15,  creatinine 1.9, glucose 124.  BNP 216.  Chest x-ray:  Lingular  infiltrate probably pneumonia.  EKG:  Q waves in V1, V4, and V6.  The  patient has T wave inversions in V4 and V5.   HOSPITAL  COURSE:  1. Community-acquired pneumonia.  [name] has symptoms and chest x-      ray findings consistent with pneumonia.  He was started on      antibiotics, azithromycin and Rocephin.  During the      hospitalization, he was afebrile.  White blood cells were trending      down to 13.3.  He related improvement of the shortness of breath      and cough.  A blood culture was negative.  Strep pneumoniae antigen      was negative.  He was discharged on Avelox.  2. Pleuritic chest pain.  This was most likely secondary to his      pneumonia.  He did not have any episodes of chest pain during his      hospitalization.  Cardiac enzymes were borderline at 0.9, 0.09,      0.08.  CK-MB was normal.  He has renalfailure and CHF, the increase      of troponin could be explained by this reason.  Cardiology was      aware.  They recommend continuing with beta-blocker and starting      aspirin and to continue with the Myoview with Dr. Tomie [name]  on March      10th as an outpatient.   1. Renal failure : acute on chronic renal failure.  Creatinine of 1.9      GFR of 39.  We do not have any prior record of his creatinine      level.  The patient had decreased fluid intake during the last day      and increased insensible loss secundary to fever and dyaforesis. he      was started on  IV fluid , then creatinine decreased to 1.6.   On the day of discharge, [name] was discharged in good condition  with vital signs blood pressure 127/63, pulse 60, respirations 18,  oxygen saturation 94 on room air, temperature 98.1.   LABORATORY:  White blood cells 13.3, hemoglobin 12.9, hematocrit 37.6,  platelets 227.  Sodium 140, potassium 3.9, chloride 108, bicarb 26, BUN  13, creatinine 1.6, glucose 98.      Hartley Barefoot, MD  Electronically Signed      Manning Charity, MD  Electronically Signed    BR/MEDQ  D:  07/22/2007  T:  07/22/2007  Job:  [id number]

## 2010-10-04 NOTE — Consult Note (Signed)
NAMECARLIS, BLANCHARD NO.:  [id number]   MEDICAL RECORD NO.:  [id number]          PATIENT TYPE:  INP   LOCATION:  5154                         FACILITY:  [facility]   PHYSICIAN:  Marlan Palau, M.D.  DATE OF BIRTH:  06-23-1945   DATE OF CONSULTATION:  09/08/2007  DATE OF DISCHARGE:                                 CONSULTATION   HISTORY OF PRESENT ILLNESS:  [name] is a 65 year old right-  handed black male born 05-01-1946 with a history of hypertension,  cardiomyopathy, coronary artery disease, and prior MI.  The patient has  had ejection fractions in the past as low as 20 to 25%, but most recent  2-D echocardiogram is showing ejection fraction in the 35% range.  The  patient has come into [facility] for evaluation of troubles  with swallowing and with hoarseness of his voice that began about 18  days prior to this admission.  The patient noted that he had difficulty  swallowing initially and then the following day had trouble with  hoarseness.  The patient then developed a severe left frontotemporal  headache along with this that lasted several days.  Six days ago, the  patient had a sudden onset of confusion.  The patient was driving the  car and all of a sudden seemed not to respond appropriately.  The  patient was not talking and when asked to handover his wallet, he gave  his wife his watch instead, and seemed to be confused.  The confusion  has persisted, but gradually improved over the time.  The patient had a  CT scan of the brain done at [facility] on August 29, 2007 that was normal.  CT scan of the brain done at this hospital on  this admission shows evidence of a new left frontal low-density area  consistent with a stroke.  Neurology is asked to see the patient for  further evaluation.   PAST MEDICAL HISTORY:  1. New-onset of left frontal stroke 6 days ago.  2. New-onset of dysphonia and dysphasia 18 days ago with  left      frontotemporal headache.  3. Cardiomyopathy.  4. Hypertension.  5. Hernia surgery in the past.  6. 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  7. Recent pneumonia in early March 2009.  8. Status post angioplasty with coronary artery disease and history of      MI.  9. Defibrillator placement.  10.Congestive heart failure, most recent ejection fraction 35 to 45%.  11.Obstructive sleep apnea on CPAP, but he is noncompliant.  12.History of syncope.  13.History of dyslipidemia.   ALLERGIES:  The patient has allergy to CODEINE.   SOCIAL HISTORY:  He quit smoking 23 years ago.  Does not drink alcohol.  The patient lives in [place], [place] area.  He is married, has  one daughter who is alive and well.  The patient is retired.   FAMILY MEDICAL HISTORY:  Both his mother and father passed away with an  MI.  The patient has four brothers, two sisters, one sister has  diabetes.  REVIEW OF SYSTEMS:  Notable for no recent fevers or chills.  The patient  denies headache currently, denies troubles with vision, denies problems  with double vision, has had trouble swallowing.  Denies neck pain.  Denies any problems with breathing, chest pains, denies abdominal pain,  nausea, vomiting, denies any problems controlling the bowels or bladder.  He does not have any balance issues, denies focal numbness or weakness  on the arms or legs.  He denies any recent blackout episodes.   PHYSICAL EXAMINATION:  VITAL SIGNS:  Blood pressure is currently 114/74,  heart rate 86, respiratory rate 18, and temperature afebrile.  GENERAL:  The patient is a fairly well-developed black male who is alert  and cooperative at the time of the examination.  HEENT:  Head is atraumatic.  Eyes, pupils are equal, round, and reactive  to light.  Disks are flat bilaterally.  NECK:  Supple.  No carotid bruits noted.  RESPIRATORY:  Clear.  CARDIOVASCULAR:  With a regular rate and rhythm.  No obvious  murmurs or  rubs noted.  EXTREMITIES:  Without significant edema.  NEUROLOGIC:  Cranial nerves as above.  Facial symmetry is present.  The  patient has good sensation of face to pinprick and soft touch  bilaterally.  He has good strength facial muscle, muscles of head  and  trunk, and shoulder shrug bilaterally.  Speech is hoarse, but not  obviously aphasic.  Speech is definitely dysphonic.  Again, extraocular  movements are full.  Visual fields are full.  Motor testing reveals 5/5  strength in all fours.  Good symmetric motor tones noted throughout.  Sensory testing is intact to pinprick, soft touch, and vibratory  sensation.  Position sense is not tested.  The patient has no evidence  of extinction.  The patient has good finger-nose-finger and heel-to-  shin.  Gait was not tested.  No drift seen in the upper or lower  extremities.  Again, deep tendon reflexes are symmetric.  Toes downgoing  bilaterally.   Laboratory values are notable for white count of 9.1, hemoglobin of  14.5, hematocrit 42.4, MCV of 89.5, platelets of 266.  Sodium 135,  potassium 3.8, chloride of 99, CO2 27, glucose of 99, BUN of 11,  creatinine 1.41, albumin 3.5, calcium 9.3, phos of 3.3, TSH of 1.097 and  B12 of 554.   CT of the head is as above.   IMPRESSION:  1. New-onset of left frontal stroke.  2. New-onset dysphonia and dysphagia.  3. Cardiomyopathy.  4. Hypertension.   This patient has clear risk factors for stroke.  The patient has had  onset of dysphonia and dysphasia along with a left frontotemporal  headache that preceded onset of the left frontotemporal stroke.  These  events may be related and could be associated with a left internal  carotid artery dissection or occlusion.  Further workup is indicated.  The patient has a history of cardiomyopathy, needs also to rule out a  cardiogenic embolus. NIHSS score is 1. Patient is not a TPA candidate  due to duration of symptoms.   PLAN:  1.  Continue aspirin therapy.  2. We will recommend a carotid Doppler study.  3. A CT angiogram of the intracranial vessels.  4. Consider repeat 2-D echocardiogram.  5. We will follow the patient's clinical course while in-house.      Marlan Palau, M.D.  Electronically Signed     CKW/MEDQ  D:  09/08/2007  T:  09/09/2007  Job:  [id number] 

## 2010-10-07 NOTE — Procedures (Signed)
[facility]  Patient:    [name], [name] Visit Number: [id number] MRN: [id number]          Service Type: PMG Location: [facility] Attending Physician:  Rolly Salter Dictated by:   Jewel Baize Stevphen [place], M.D. Proc. Date: 07/23/01 Admit Date:  06/21/2001                             Procedure Report  HISTORY:   [facility] I evaluated her Review of Health and history form, 14 point review of systems.  PROBLEM LIST: 1. He is improved. Improved functional indices and quality of life indices. I    am going to predicate any further injection based on need. 2. Health and history form reviewed, 14 point review of systems. 3. I am probably hold off on third cervical epidural. He is improved in this    regard. Do not believe it necessary to proceed with further injections but    base it on need at a later date.  PHYSICAL EXAMINATION:  Objectively, he has diffuse paracervical myofascial discomfort, impaired flexion, extension, lateral rotational pain. Positive cervical facetal compression test, left greater than right. No new neurological features, motor, sensory, reflexive.  IMPRESSION:  Degenerative spinal disease, cervical spine.  PLAN:  Cervical epidural. He is consented.  DESCRIPTION OF PROCEDURE:  The patient is taken to the fluoroscopy suite and placed _______ position. Neck prepped and draped in usual fashion. Using a Hustead needle, I advance to C5-6 interspace. No evidence of CSF, heme, or paresthesia. Test block uneventfully followed by 40 mg of Aristocort, flushed needle.  He tolerates the procedure well. No complication from our procedure. Discharge instructions given. Dictated by:   Jewel Baize Stevphen Rochester, M.D. Attending Physician:  Rolly Salter DD:  07/23/01 TD:  07/24/01 Job: [id number]

## 2010-10-07 NOTE — Procedures (Signed)
[name]  Patient:    [name], [name] Visit Number: [id number] MRN: [id number]          Service Type: PMG Location: [facility] Attending Physician:  Rolly Salter Dictated by:   Celene Kras, M.D. Proc. Date: 06/25/01 Admit Date:  06/21/2001   CC:         [name], M.D.   Procedure Report  [name].  I reviewed health and history form, 14 point review of systems.  The patient is kindly referred to [place] by Dr. Renae Fickle.  HISTORY OF PRESENT ILLNESS:  [name] is a 65 year old male, complaining of left arm pain.  He is relating his pain as an 8/10 on a subjective scale, dull, aching, and throbbing, improved with rest.  He is also improved sometimes with medications, and has had appropriate MRI studies.  I review these studies.  He reveals degenerative components of C3-4, C5-6, and C6-7. He does not have critical stenosis.  He relates no specific injury.  He has had a decline in functional capacity.  MEDICATIONS:  Corgard, Lipitor, and Tylox.  ALLERGIES:  CODEINE which upsets his stomach.  REVIEW OF SYSTEMS:  States no wish to harm self or others.  A 14 point review of systems, health and history form reviewed.  PAST MEDICAL HISTORY:  Heart attack x 2, 1986 and 1999, uncomplicated with resolution but disabling.  He states no cardiac symptoms and is followed closely by cardiology.  PAST SURGICAL HISTORY:  Herniorrhaphy.  FAMILY HISTORY:  Remarkable for heart disease.  SOCIAL HISTORY:  Previous smoker.  50-pack-year.  He has quit.  Does not drink alcohol.  He is married.  He is not working, and he used to work in a laboring position.  Review of systems, family and social history otherwise noncontributory to the pain problem.  PHYSICAL EXAMINATION:  GENERAL:  A pleasant male sitting comfortably in bed.  Gait, affect, appearance is normal, oriented x 3.  HEENT:   Unremarkable.  CHEST:  Clear to auscultation and percussion.  HEART:  He has a regular rate and rhythm without murmur, rub or gallop.  ABDOMEN:  Soft, nontender, benign, no hepatosplenomegaly.  NEUROLOGIC:  He has diffuse paralumbar myofascial discomfort.  Impaired flexion and extension, and lateral rotational pain positive.  Cervical facetal compression test right greater than left and pain with extension.  He has slightly decreased tricep jerk to the right side at 1+ but otherwise intact neurologically to motor, sensory, and reflex.  IMPRESSION: 1. 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 2. Coronary artery disease.  PLAN:  Cervical epidural.  He is consented.  DESCRIPTION OF PROCEDURE:  The patient is taken to the procedure area and placed in upright position, neck prepped and draped in the usual fashion. Using a Hustead needle, advance to the C5-6 interspace.  No evidence of CSF, blood, or paresthesia.  Test block uneventfully.  Follow with 40 mg of Aristocort and flush needle.  Tolerates the procedure well.  No complications from our procedure.  Discharge instructions given. Dictated by:   Celene Kras, M.D. Attending Physician:  Rolly Salter DD:  06/25/01 TD:  06/25/01 Job: [id number]

## 2010-10-07 NOTE — H&P (Signed)
NAMECALOB, BASKETTE NO.:  [id number]   MEDICAL RECORD NO.:  [id number]          PATIENT TYPE:  INP   LOCATION:  2899                         FACILITY:  [facility]   PHYSICIAN:  Duke Salvia, M.D.  DATE OF BIRTH:  [birth year]   DATE OF ADMISSION:  02/11/2004  DATE OF DISCHARGE:                                HISTORY & PHYSICAL   HISTORY OF PRESENT ILLNESS:  Mr. Chevez is a 65 year old gentleman with  history of ischemia heart disease, status post myocardial infarction in  1986, at which time he underwent PCI of the circumflex lesion.  He was seen  in consultation by Dr. Tomie China some months ago because of dizziness and  syncope.  He has three distinct syndromes.  The first sounds quite like  vertigo.  It has not occurred in the last couple of years.  The episodes  were characterized by severe dizziness if he was perfectly still.  In the  event that he moved his head, there would be violent rotation of his  environment and nausea.  The second syndrome was associated with bending  over at the waist and occasionally would bend over and according to his  wife, just keep going.  He has also had problems with orthostatic  lightheadedness when going from lying to standing.   His two episodes of syncope were quite different.  He and his wife do not  recall the details of the second.  The first occurred after he had been  working underneath the sink.  He stood up and walked out of the apartment  building to the golf cart where he kept his supplies.  He had no antecedent  symptoms.  He sat down in the golf cart and then without warning fell to the  ground.  The other episode that he best recalls is similarly abrupt.   Cardiac evaluation undertaken by Dr. Tomie China is notable for a Holter  monitor demonstrating both nonsustained ventricular tachycardia and frequent  PVCs.  An echocardiogram that demonstrated moderate depression with LV  systolic function with mitral  regurgitation and discrete wall motion  abnormality.  Catheterization demonstrating essentially patent vessels, but  with an ejection fraction in the 25% range.  This demonstrated patent RCA  and LAD with a subtotal circumflex.  Ejection fraction was 20-25% with mild  to moderate MR.  MUGA done to try to arbitrate the two ejection fractions  that came out with an all together different number of 47%.   In addition to the aforementioned objective data (Ha-Ha), the patient has  significant exercise intolerance.  His mailbox is 100-150 yards down the  driveway.  He is able to get down there, but he is not able to get up  without stopping twice for shortness of breath.  He denies nocturnal  dyspnea, orthopnea or peripheral edema.  Currently, he has also undergone  evaluation for sleep apnea and this apparently is quite positive.   MEDICATIONS:  1.  Toprol 50.  2.  Captopril 12.5.  3.  Lipitor 80.  4.  Aspirin.  5.  Nexium.   ALLERGIES:  CODEINE.  SOCIAL HISTORY:  He is married with a couple of kids.  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   PAST MEDICAL HISTORY:  Dyslipidemia, otherwise unrevealing.   PHYSICAL EXAMINATION:  GENERAL:  He is a middle-age to [place]-  American male in no acute distress.  VITAL SIGNS:  Blood pressure 146/74 with a pulse of 65.  HEENT:  No xanthomatous, neck veins were flat, carotid upstrokes are brisk  without bruits.  BACK:  Without kyphosis or scoliosis.  LUNGS:  Clear.  HEART:  Sounds regular without murmurs, rubs or gallops.  ABDOMEN:  Soft with active bowel sounds without midline pulsation or  hepatomegaly.  EXTREMITIES:  Femoral pulses were 2+, distal pulses were intact.  No  clubbing, cyanosis or edema.  NEUROLOGIC:  Grossly normal.   LABORATORY DATA AND X-RAY FINDINGS:  EKG dated September 1, from our office,  demonstrates sinus rhythm at 70 with intervals of 0.14/0.11/0.42.  There is  evidence of a prior  lateral MI and occasional PVCs with PACs.   IMPRESSION:  1.  Syncope, recurrent.  2.  Ischemic heart disease.      1.  Status post myocardial infarction.      2.  Ejection fraction of 20-25% by catheterization and 45% by multigated          angiogram (MUGA).      3.  Mild to moderate mitral regurgitation.      4.  Class II-III heart failure.  3.  Nonsustained ventricular tachycardia.  4.  Obstructive sleep apnea.  5.  Dyslipidemia.  6.  Chronotropic incompetence.   DISCUSSION:  Mr. Stanke has syncope in the context of ischemic heart  disease and a variable quantitation of his left ventricular systolic  function, the most severe of which is his catheterization.  Given the  complex ventricular ectopy and the question mark on his ejection fraction, I  think electrophysiology testing would be appropriate to try and see if there  is an alternative explanation for his syncope.  In the event that this is  unrevealing or he has inducible sustained ventricular tachycardia, given the  depression of his LV function, I think it is reasonable to proceed with ICD  implantation.   I also wonder if the patient has had some degree of chronotropic  incompetence given his exercise tolerance.  His mean heart rate on his  Holter was only 77 beats per minute with a maximum of 110.  This may reflect  adequate beta-  blockade or if may reflect more problem.  I guess the other question is that  if there is any possibility that his circumflex lesion is contributing to  ischemia in the region of his mitral valve apparatus.  I will have to run  this Dr. Tomie China because he will be more knowledgeable about this than I.       SCK/MEDQ  D:  02/11/2004  T:  02/11/2004  Job:  [id number]   cc:   [facility]   Electrophysiology Laboratory

## 2010-10-07 NOTE — Op Note (Signed)
[name], [name] NO.:  [id number]   MEDICAL RECORD NO.:  [id number]          PATIENT TYPE:  INP   LOCATION:  2899                         FACILITY:  [facility]   PHYSICIAN:  Duke Salvia, M.D.  DATE OF BIRTH:  October 22, 1945   DATE OF PROCEDURE:  02/11/2004  DATE OF DISCHARGE:                                 OPERATIVE REPORT   PREOPERATIVE DIAGNOSES:  1.  Syncope.  2.  Ischemic heart disease.  3.  Prior myocardial infarction.  4.  Ejection fraction of 25-45%.   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   PROCEDURE:  Invasive electrophysiological study.   Following the obtaining of informed consent, the patient was brought to the  electrophysiology laboratory and placed on the fluoroscopic table in the  supine position.  After routine prep and drape, cardiac catheterization was  performed with local anesthesia and conscious sedation.  Noninvasive blood  pressure monitoring, transcutaneous oxygen saturation monitoring, and end-  tidal CO2 monitoring were performed continuously throughout the procedure.  Following the procedure the catheters were removed, hemostasis was obtained,  and the patient was then prepared for ICD implantation in stable condition.   CATHETERS:  A 5 French quadripolar catheter was inserted via the left  femoral vein to the AV junction to measure the His electrogram.   A 5 French quadripolar catheter was inserted via the left femoral vein to  the right ventricular apex and subsequently moved to the high right atrium.   Surface leads I, aVF, and V1 were monitored continuously throughout the  procedure.  Following insertion of the catheters, the stimulation protocol  included incremental atrial pacing, incremental ventricular pacing, single  atrial extrastimuli at a pace cycle length of 600 msec, single ventricular  extrastimuli from the right ventricular apex at a pace cycle length  of  400:600 msec.   RESULTS AND INTERNAL MEASUREMENTS:  Rhythm:  Sinus.  Cycle length:  771 msec.  PR interval:  181 msec.  QRS duration:  94 msec.  QT interval:  350 msec.  P-wave duration:  124 msec.  Bundle branch block:  Absent.  Pre-excitation:  Absent.   AH interval:  82 msec.  HV interval:  49 msec.   AV NODAL FUNCTION:  AV Wenckebach cycle length was 350 msec.  VA Wenckebach was 530 msec.  The AV nodal effective refractory period at 600 msec was 340 msec without  evidence of discontinuity.   ACCESSORY PATHWAY FUNCTION:  No evidence of an accessory pathway was  identified.   VENTRICULAR STIMULATION:  Single ventricular extrastimuli resulted in the  induction of ventricular tachycardia that generated rapidly into ventricular  fibrillation.   ARRHYTHMIAS INDUCED:  Ventricular tachycardia rapidly degenerated into  ventricular fibrillation, was induced with single ventricular extrastimuli  from the right ventricular apex at a cycle length of 400:600:270.  The  tachycardia was initially at 220 msec and then accelerated.  It was  ultimately terminated by external defibrillation.  The patient recovered  well.   IMPRESSION:  1.  Sinus bradycardia.  2.  Normal atrial function.  3.  Normal AV nodal function.  4.  Normal His-Purkinje system function.  5.  No accessory pathway.  6.  Inducible sustained monomorphic ventricular tachycardia with rapid      degeneration into ventricular fibrillation, identified with single      ventricular extrastimuli.   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  Given this, we will  proceed with  implantable cardioverter-defibrillator implantation.       SCK/MEDQ  D:  02/11/2004  T:  02/12/2004  Job:  [id number]   cc:   Aundra Dubin. Revankar, M.D.  [address]  [place]  [place] [address]  Fax: [phone]   Electrophysiology Laboratory   [facility]

## 2010-10-07 NOTE — Op Note (Signed)
[name], [name] NO.:  [id number]   MEDICAL RECORD NO.:  [id number]          PATIENT TYPE:  INP   LOCATION:  2899                         FACILITY:  [facility]   PHYSICIAN:  Duke Salvia, M.D.  DATE OF BIRTH:  Dec 03, 1945   DATE OF PROCEDURE:  02/11/2004  DATE OF DISCHARGE:                                 OPERATIVE REPORT   PROCEDURE:  Implantable cardioverter-defibrillator implantation and  defibrillation threshold testing.   PREOPERATIVE DIAGNOSIS:  Syncope with inducible ventricular  tachycardia/fibrillation and ischemic heart disease.   POSTOPERATIVE DIAGNOSIS:  Syncope with inducible ventricular  tachycardia/fibrillation and ischemic heart disease.   PROCEDURE:  Dual-chamber defibrillator implantation with intraoperative  defibrillation threshold testing.   Following the obtaining of informed consent then, the patient was prepared  for ICD implantation.  After routine prep and drape of the left upper chest,  lidocaine was infiltrated in the prepectoral subclavicular region.  An  incision was made and carried down to the layer of the prepectoral fascia  using electrocautery and sharp dissection.  A pocket was formed similarly.  Hemostasis was obtained.   Thereafter attention was turned to gaining access to the extrathoracic left  subclavian vein, which was accomplished without difficulty and without the  aspiration of air or puncture of the artery.  Two separate venipunctures  were accomplished, guidewires were placed and retained, and a 0 silk suture  was placed in a figure-of-eight fashion and allowed to hang loosely.   Subsequently a 44 [place] and 7 [place] tear-away introducer sheath were  placed, through which were then passed a Guidant model 0158 active-fixation  dual-coil defibrillator lead, serial number [id number], and a Medtronic 5076 52-  cm active-fixation atrial lead, serial number [id number].  Under  fluoroscopic guidance these were  manipulated to the right ventricular apex  and the right atrial appendage, respectively, where the bipolar R-wave was  5.9 mV with a pacing impedance of 616 Ohms, a pacing threshold of 0.4 V at  0.5 msec, with no diaphragmatic pacing at 10 volts.   The bipolar P-wave was 6 mV with a pacing impedance of 890 Ohms, a pacing  threshold of 0.8 V at 0.5 msec.  Actually, it was not 6 V, it was 3.6 V.   With these acceptable parameters recorded, the leads were attached to a  Guidant Vitality DS model T125DR ICD, serial number [id number].  Through the  device the bipolar P-wave was 2.3 mV with a pacing impedance of 735 Ohms, a  pacing threshold of 0.8 V at 0.5 msec.  The bipolar R-wave was 14.4 mV with  a pacing impedance of 632 Ohms, a threshold of 0.2 V at 0.5 msec.  The high  voltage impedance was 42 Ohms.   With these acceptable parameters recorded, defibrillation threshold testing  was undertaken. Ventricular fibrillation was induced via the T-wave shock.  After a total duration of 5 seconds, a 14-joule shock was delivered through  a measured resistance of 41 Ohms, terminating ventricular fibrillation and  restoring sinus rhythm.  After a wait of five to six minutes,ventricular  fibrillation was reinduced via the T-wave shock.  After a total duration of  5 seconds, a 14-joule shock was delivered through a measured resistance of  41 Ohms, terminating ventricular fibrillation and restoring sinus rhythm.  With these acceptable parameters recorded, the ICD was implanted.  The  pocket was copiously irrigated with antibiotic-containing solution,  hemostasis was ensured, the leads and the pulse generator were placed in the  pocket, secured to the prepectoral fascia.  The wound was closed in three  layers in a normal fashion.  The wound was washed, dried, and a Benzoin and  Steri-Strip dressing was applied.  Needle counts, sponge counts, and  instrument counts were correct at the end of the procedure  according to the  staff.   The patient tolerated the procedure without apparent complication.       SCK/MEDQ  D:  02/11/2004  T:  02/12/2004  Job:  [id number]

## 2010-10-07 NOTE — Discharge Summary (Signed)
[name] NO.:  [id number]   MEDICAL RECORD NO.:  [id number]          PATIENT TYPE:  INP   LOCATION:  2027                         FACILITY:  [facility]   PHYSICIAN:  Duke Salvia, M.D.  DATE OF BIRTH:  Jan 20, 1946   DATE OF ADMISSION:  02/11/2004  DATE OF DISCHARGE:  02/12/2004                                 DISCHARGE SUMMARY   DISCHARGE DIAGNOSES:  1.  Post-procedure day #1, after implantation of Guidant VITALITY DS, Model      T125 DR, the serial number [id number], with successful defibrillator      threshold study equal to or less than 14 joules.  Prior to this      implantation, the patient had had an electrophysiology study with      mapping and inducement of ventricular tachycardia and ventricular      fibrillation.  2.  History of nonischemic cardiomyopathy with an ejection fraction of 25%      by catheterization and 45% by MUGA.  3.  History of myocardial infarction.  4.  Congestive heart failure.  5.  History of nonsustained ventricular tachycardia.  6.  Obstructive sleep apnea.  7.  Possible chronotropic incompetence.  8.  Syncope.   DISCHARGE DISPOSITION:  [name] is ready for discharge post-  procedure day #1, after implantation of ICD.  His chest x-ray is  appropriate, the leads are in good position, he has no pneumothorax.  The  patient has no complaints except for complaint that the incision is sore.  The incision itself has mild swelling.  No ecchymosis, no drainage and no  erythema.  The patient is in predominantly sinus rhythm with occasional  PVCs, intermittent A pacing at the time of discharge.  There is no rub on  auscultation of the cardiac impulse.   MEDICATIONS:  The patient's medication dose has been changed from Toprol XL  50 mg a day to metoprolol 25 mg in the morning and 25 mg in the evening.  He  also takes aspirin 81 mg daily, Nexium 40 mg daily, Lipitor 80 mg daily at  bedtime, and captopril 12.5 mg daily  three times a day.  For pain at the  incision site, Tylenol 325 mg one to two tablets every four to six hours as  needed.   DISCHARGE INSTRUCTIONS:  1.  Activity and mobility of the left upper extremity after this      implantation as been discussed with the patient at length.  He goes home      on a low-sodium, low-cholesterol diet.  2.  He is to keep the incision dry for the next week, and shower beginning      Thursday as week from today.   FOLLOWUP:  1.  A pacer clinic appointment March 09, 2004, at 9 o'clock in the      morning.  2.  He will see Dr. Graciela Husbands, on June 01, 2004, at 2:40 in the afternoon.   BRIEF HISTORY:  [name] is a 65 year old gentleman with a history of  ischemic heart disease.  He is status post myocardial infarction  in 1986.  At that time, he underwent PCI of a lesion in the circumflex artery.  He was  seen in consultation by Dr. Tomie China some months ago because of dizziness  and syncope.  He has three distinct syndromes.  The first sounds quite like  vertigo and has not occurred in the last couple of years.  Episodes  characterized by dizziness if he was perfectly still.  In the event he moved  his head, there would be violent rotation of his environment and nausea.  The second syndrome was associated with bending over at the waist, and  occasionally he would bend over and, according to his wife, he would just  keep going.  He has also had problems with orthostatic lightheadedness when  going from lying to stranding.  His two episodes of syncope, however, were  quite different.  He and his wife do not recall the details of the second.  The first occurred after he had been working underneath his sink.  He stood  up and walked out of the apartment building to the golf cart where he kept  his supplies.  He had no antecedent symptoms.  He sat down on the gold cart  and then, without warning, he fell to the ground.  The other episodes that  he best recalls  as similarly abrupt with no warning.  Cardiac evaluation  undertaken by Dr. Tomie China is notable for a Holter monitor demonstrating  both nonsustained V-tach, and frequent PVCs.  Echocardiogram showed moderate  depression with LV systolic function with mitral regurgitation and discrete  wall motion abnormality.  Catheterization showed essentially patent vessels,  but an ejection fraction in the 25% range.  MUGA scan was done to try to  arbitrate the two ejections fractions, and came out altogether number with  47%.  As well, the patient has significant exercise intolerance.  His  mailbox is 100 to 150 yards down the driveway.  He is able to get down  there, but not able to get up without stopping twice for shortness of  breath.  He denies nocturnal dyspnea, orthopnea or peripheral edema.  Currently, he has undergone evaluation for sleep apnea and apparently this  is quite positive.   PLAN:  The patient presents with complex ventricular ectopy, and a question  mark on his ejection fraction.  Electrophysiology testing would be  appropriate to try to see if there is an alternative explanation for his  syncope.  If the patient does have inducible ventricular tachycardia, it  would be reasonable to proceed with ICD implantation.   HOSPITAL COURSE:  The patient presented electively on September 22 to [facility].  He underwent electrophysiology study, inducible ventricular  tachycardia and ventricular fibrillation was observed.  The patient then  subsequently had a Guidant ICD implanted.  The patient's post-procedure  progress has been unremarkable.  The ICD was interrogated on the morning of  post-procedure day #1 and found to be working within normal limits.   He goes home with the medications and followup as dictated above.  His  __________ is 25 mg b.i.d. instead of 50 mg b.i.d.       GM/MEDQ  D:  02/12/2004  T:  02/13/2004  Job:  [id number]   cc:   Duke Salvia,  M.D.  Aundra Dubin. Revankar, M.D.  [address]  [place]  [place] [address]  Fax: [phone]

## 2010-10-08 ENCOUNTER — Other Ambulatory Visit: Payer: Self-pay | Admitting: Internal Medicine

## 2010-11-08 ENCOUNTER — Encounter: Payer: Medicare PPO | Admitting: Internal Medicine

## 2010-11-09 ENCOUNTER — Encounter: Payer: Medicare PPO | Admitting: *Deleted

## 2010-11-21 ENCOUNTER — Ambulatory Visit (INDEPENDENT_AMBULATORY_CARE_PROVIDER_SITE_OTHER): Payer: Medicare Other | Admitting: *Deleted

## 2010-11-21 ENCOUNTER — Other Ambulatory Visit: Payer: Self-pay | Admitting: Internal Medicine

## 2010-11-21 DIAGNOSIS — I4891 Unspecified atrial fibrillation: Secondary | ICD-10-CM

## 2010-11-21 DIAGNOSIS — I428 Other cardiomyopathies: Secondary | ICD-10-CM

## 2010-11-21 LAB — ICD DEVICE OBSERVATION
AL AMPLITUDE: 3.7 mv
AL IMPEDENCE ICD: 491 Ohm
AL THRESHOLD: 0.5 V
BAMS-0001: 160 {beats}/min
BAMS-0001: 160 {beats}/min
BAMS-0002: 16 ms
BAMS-0003: 70 {beats}/min
CHARGE TIME: 8.729 s
CHARGE TIME: 8.729 s
DEV-0020ICD: NEGATIVE
DEVICE MODEL ICD: 141895
HV IMPEDENCE: 50 Ohm
HV IMPEDENCE: 50 Ohm
RV LEAD AMPLITUDE: 18.9 mv
RV LEAD IMPEDENCE ICD: 461 Ohm
RV LEAD THRESHOLD: 1.1 V
TOT-0001: 7
TZAT-0001FASTVT: 1
TZAT-0002FASTVT: NEGATIVE
TZAT-0005FASTVT: 81 pct
TZAT-0005FASTVT: 81 pct
TZAT-0012FASTVT: 210 ms
TZAT-0013FASTVT: 2
TZAT-0018FASTVT: NEGATIVE
TZAT-0018FASTVT: NEGATIVE
TZAT-0019FASTVT: 5 V
TZAT-0019FASTVT: 5 V
TZAT-0020FASTVT: 1 ms
TZAT-0020FASTVT: 1 ms
TZON-0003FASTVT: 364 ms
TZON-0003FASTVT: 364 ms
TZON-0004FASTVT: 2.5
TZST-0001FASTVT: 4
TZST-0001FASTVT: 5
TZST-0001FASTVT: 7
TZST-0001FASTVT: 7
TZST-0001FASTVT: 8
TZST-0003FASTVT: 11 J
TZST-0003FASTVT: 41 J
TZST-0003FASTVT: 41 J
TZST-0003FASTVT: 41 J
TZST-0003FASTVT: 41 J

## 2010-11-21 NOTE — Progress Notes (Signed)
icd check in clinic  

## 2010-11-29 ENCOUNTER — Encounter: Payer: Medicare PPO | Admitting: Internal Medicine

## 2010-11-30 ENCOUNTER — Encounter: Payer: Self-pay | Admitting: Internal Medicine

## 2010-11-30 ENCOUNTER — Ambulatory Visit (INDEPENDENT_AMBULATORY_CARE_PROVIDER_SITE_OTHER): Payer: Medicare Other | Admitting: Internal Medicine

## 2010-11-30 DIAGNOSIS — I6350 Cerebral infarction due to unspecified occlusion or stenosis of unspecified cerebral artery: Secondary | ICD-10-CM

## 2010-11-30 DIAGNOSIS — I4891 Unspecified atrial fibrillation: Secondary | ICD-10-CM

## 2010-11-30 DIAGNOSIS — M25519 Pain in unspecified shoulder: Secondary | ICD-10-CM

## 2010-11-30 DIAGNOSIS — E785 Hyperlipidemia, unspecified: Secondary | ICD-10-CM

## 2010-11-30 DIAGNOSIS — I428 Other cardiomyopathies: Secondary | ICD-10-CM

## 2010-11-30 DIAGNOSIS — I639 Cerebral infarction, unspecified: Secondary | ICD-10-CM

## 2010-11-30 DIAGNOSIS — I2510 Atherosclerotic heart disease of native coronary artery without angina pectoris: Secondary | ICD-10-CM

## 2010-11-30 MED ORDER — HYDROCODONE-ACETAMINOPHEN 5-325 MG PO TABS: 1.0000 | ORAL_TABLET | ORAL | Status: DC | PRN

## 2010-11-30 MED ORDER — FENOFIBRATE 160 MG PO TABS: 160.0000 mg | ORAL_TABLET | Freq: Every day | ORAL | Status: DC

## 2010-11-30 MED ORDER — METOPROLOL SUCCINATE ER 50 MG PO TB24: 50.0000 mg | ORAL_TABLET | Freq: Every day | ORAL | Status: DC

## 2010-11-30 MED ORDER — RANITIDINE HCL 150 MG PO TABS: 150.0000 mg | ORAL_TABLET | Freq: Every day | ORAL | Status: DC

## 2010-11-30 MED ORDER — NITROGLYCERIN 0.4 MG SL SUBL: 0.4000 mg | SUBLINGUAL_TABLET | SUBLINGUAL | Status: DC | PRN

## 2010-11-30 MED ORDER — ESOMEPRAZOLE MAGNESIUM 40 MG PO CPDR: 40.0000 mg | DELAYED_RELEASE_CAPSULE | Freq: Every day | ORAL | Status: DC

## 2010-11-30 NOTE — Assessment & Plan Note (Signed)
No chest pain today.  Renewed Nitrostat prescription.

## 2010-11-30 NOTE — Assessment & Plan Note (Signed)
Regular rhythm today.  Renewed prescription for Metoprolol.

## 2010-11-30 NOTE — Assessment & Plan Note (Addendum)
No residual deficits on exam besides memory problems.  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  Renewed Nexium prescription.

## 2010-11-30 NOTE — Progress Notes (Signed)
Subjective:    [name], male    DOB: [birth year], 65 y.o.   MRN: [id number]  HPI  [name] is a 65 y/o with history of Cardiomyopathy, CAD, Atrial Fibrillation, and CVA who presents with left shoulder and arm pain.  Pain started a week and a half ago when he woke up.  Extends from behind his left shoulder down his arm to the elbow.  He also has some decreased sensation and tingling in his little finger and half of his ring finger.  The pain is worse when he abducts his arm.  The pain is gradually getting worse.  He has tried tramadol, aleve, and voltaren gel with none of these helping at all.  He had similar symptoms at the end of 2010/beginning of 2011 and reports that the pain resolved after receiving an injection in his shoulder and going to physical therapy.  Physical therapy report in the records indicates he completed his course there and reached their goals.  He has not had a recurrence since until a week and a half ago. He also had previous episodes of similar pain before the end of 2010.  He had a stroke about two years ago.  He had speech deficits at the time and gives a history of undergoing surgery for vocal cord paralysis.  His only current residual symptom from the stroke is memory difficulties.     Review of Systems Review of Systems - History obtained from the patient and family General ROS: negative for - fatigue or weight loss Respiratory ROS: negative for - cough or shortness of breath Cardiovascular ROS: negative for - chest pain, edema, palpitations or shortness of breath Gastrointestinal ROS: negative for - abdominal pain, change in bowel habits, change in stools, diarrhea or nausea/vomiting Genito-Urinary ROS: negative for - dysuria or hematuria Musculoskeletal ROS: See HPI Neurological ROS: Positive for- See HPI  negative for - weakness      Objective:   Physical Exam  General: alert, well-developed, and cooperative to examination.  Head:  normocephalic and atraumatic.  Eyes: vision grossly intact, pupils equal, pupils round, pupils reactive to light, no injection and anicteric.  Mouth: pharynx pink and moist, no erythema, and no exudates.  Neck: supple, full ROM, no thyromegaly, no JVD. Lungs: normal respiratory effort, no accessory muscle use, normal breath sounds, no crackles, and no wheezes. Heart: normal rate, regular rhythm.  Msk: no joint swelling, no joint warmth, and no redness over joints.       Right shoulder: Full ROM in all directions with no pain.  5/5 strength.  Right arm strength 5/5 and ROM full distally as well.      Left shoulder: Passive range of motion full but with pain.  Active range of motion restricted in abduction and internal/external rotation.  Likely limited by pain.  Strength in abduction 4/5 past 90 degrees abduction (pain?), but 5/5 under minimal      abduction.  Tender to palpation anteriorly over humeral head and medial to humeral head.  Additional tender point about 4 inches below acromion posteriorly.  Too much pain to test internal/external rotation strength.  Pulses: 2+ brachial pulses bilaterally Extremities: No cyanosis, clubbing, edema  Neurologic: alert & oriented X3, cranial nerves II-XII intact.  Sensation to pinprick and light touch decrease in Left Hand 5th digit and 4th digit compared to Right hand digits.  Sensation symmetric in digits 2 and 3 bilaterally.  Psych: Oriented X3, memory intact for recent and remote, normally interactive, good eye contact, not anxious appearing, and not depressed appearing.      Assessment & Plan:

## 2010-11-30 NOTE — Assessment & Plan Note (Addendum)
Mr. Jlen has signs and symptoms of subacromial pathology as well as some signs of biceps tendonitis (long head) on exam today.  He also has symptoms of numbness and tingling and decreased sensation to pinprick on exam in the ulnar distribution in his hand.    Plan: Referred to sports medicine for evaluation.  Visit scheduled for December 12, 2010.  Will defer to Sports Medicine decision on referring to Physical Therapy. Injection given today in internal medicine clinic due to acuity of pain.  Subacromial space injected with a mixture of 4ml 1% lidocaine and 2ml Kenalog.  Patient reported immediate pain relief. Hydrocodone/Acetaminophen 5mg /325mg  Q4HPRN for pain.  #50 pills given in prescription today 11/30/10.   Discontinue Tramadol Discontinue Aleve in setting of past renal insufficiency.   Can continue Voltaren Gel if it is helping at all Can try heat pads to see if they help

## 2010-11-30 NOTE — Patient Instructions (Signed)
We are referring you to Sports Medicine.    Please follow-up in 3-4 weeks.    Please stop taking Aleve and stop taking tramadol.

## 2010-11-30 NOTE — Progress Notes (Deleted)
  Subjective:    [name], male    DOB: 11-Mar-1946, 65 y.o.   MRN: [id number]  HPI    Review of Systems     Objective:   Physical Exam        Assessment & Plan:

## 2010-12-05 NOTE — Progress Notes (Signed)
I saw, examined, and discussed the patient with Dr Yaakov Guthrie and agree with the note contained here. Dr Phillips Odor assisted Dr Yaakov Guthrie in the shoulder injection.

## 2010-12-12 ENCOUNTER — Ambulatory Visit: Payer: Medicare Other | Admitting: Family Medicine

## 2010-12-19 ENCOUNTER — Ambulatory Visit (INDEPENDENT_AMBULATORY_CARE_PROVIDER_SITE_OTHER): Payer: Medicare Other | Admitting: Internal Medicine

## 2010-12-19 ENCOUNTER — Encounter: Payer: Self-pay | Admitting: Internal Medicine

## 2010-12-19 VITALS — BP 143/89 | HR 55 | Temp 97.5°F | Resp 20 | Ht 70.0 in | Wt 194.3 lb

## 2010-12-19 DIAGNOSIS — I10 Essential (primary) hypertension: Secondary | ICD-10-CM

## 2010-12-19 DIAGNOSIS — E785 Hyperlipidemia, unspecified: Secondary | ICD-10-CM

## 2010-12-19 DIAGNOSIS — M25519 Pain in unspecified shoulder: Secondary | ICD-10-CM

## 2010-12-19 LAB — COMPREHENSIVE METABOLIC PANEL
ALT: 32 U/L (ref 0–53)
AST: 24 U/L (ref 0–37)
Albumin: 4.4 g/dL (ref 3.5–5.2)
Alkaline Phosphatase: 54 U/L (ref 39–117)
Glucose, Bld: 97 mg/dL (ref 70–99)
Potassium: 5 mEq/L (ref 3.5–5.3)
Sodium: 139 mEq/L (ref 135–145)
Total Bilirubin: 0.5 mg/dL (ref 0.3–1.2)
Total Protein: 7.1 g/dL (ref 6.0–8.3)

## 2010-12-19 LAB — LIPID PANEL
Cholesterol: 181 mg/dL (ref 0–200)
HDL: 40 mg/dL
LDL Cholesterol: 122 mg/dL — ABNORMAL HIGH (ref 0–99)
Total CHOL/HDL Ratio: 4.5 ratio
Triglycerides: 96 mg/dL
VLDL: 19 mg/dL (ref 0–40)

## 2010-12-19 MED ORDER — HYDROCODONE-ACETAMINOPHEN 5-325 MG PO TABS: 1.0000 | ORAL_TABLET | ORAL | Status: AC | PRN

## 2010-12-19 NOTE — Patient Instructions (Signed)
Follow up in 3-4 weeks for follow up

## 2010-12-20 NOTE — Progress Notes (Signed)
65 year old woman with an extensive cardiac history as listed below, chronic shoulder pain from arthritis recently received a cortisone injection in our clinic 2 weeks ago initially of shoulder comes for followup. He was also prescribed 60 tablets of Vicodin at the last visit. He feels better as far as his pain is concerned. He does have some shoulder pain but it is controlled with Vicodin. He has used 40 tablets of Vicodin in the last 2 weeks and requests refill on it. He is able to perform his activities of daily living without any restrictions. He has tried ibuprofen before which was helping but was discontinued because of his renal insufficiency and past and cardiac history. He was also on Toradol before which is discontinued for unclear reasons.  No other complaints today. No chest pain, no shortness of breath or swelling of legs.  Constitutional: Denies fever, chills, diaphoresis, appetite change and fatigue.  HEENT: Denies photophobia, eye pain, redness, hearing loss, ear pain, congestion, sore throat, rhinorrhea, sneezing, mouth sores, trouble swallowing, neck pain, neck stiffness and tinnitus.   Respiratory: Denies SOB, DOE, cough, chest tightness,  and wheezing.   Cardiovascular: Denies chest pain, palpitations and leg swelling.  Gastrointestinal: Denies nausea, vomiting, abdominal pain, diarrhea, constipation, blood in stool and abdominal distention.  Genitourinary: Denies dysuria, urgency, frequency, hematuria, flank pain and difficulty urinating.  Musculoskeletal: Denies myalgias, back pain, joint swelling, arthralgias and gait problem.  Skin: Denies pallor, rash and wound.  Neurological: Denies dizziness, seizures, syncope, weakness, light-headedness, numbness and headaches.  Hematological: Denies adenopathy. Easy bruising, personal or family bleeding history  Psychiatric/Behavioral: Denies suicidal ideation, mood changes, confusion, nervousness, sleep disturbance and agitation  BP  143/89  Pulse 55  Temp(Src) 97.5 F (36.4 C) (Oral)  Resp 20  Ht 5\' 10"  (1.778 m)  Wt 194 lb 4.8 oz (88.134 kg)  BMI 27.88 kg/m2  General Appearance:    Alert, cooperative, no distress, appears stated age  Head:    Normocephalic, without obvious abnormality, atraumatic  Eyes:    PERRL, conjunctiva/corneas clear, EOM's intact, fundi    benign, both eyes       Ears:    Normal TM's and external ear canals, both ears  Nose:   Nares normal, septum midline, mucosa normal, no drainage   or sinus tenderness  Throat:   Lips, mucosa, and tongue normal; teeth and gums normal  Neck:   Supple, symmetrical, trachea midline, no adenopathy;       thyroid:  No enlargement/tenderness/nodules; no carotid   bruit or JVD  Back:     Symmetric, no curvature, ROM normal, no CVA tenderness  Lungs:     Clear to auscultation bilaterally, respirations unlabored  Chest wall:    No tenderness or deformity  Heart:    Regular rate and rhythm, S1 and S2 normal, no murmur, rub   or gallop  Abdomen:     Soft, non-tender, bowel sounds active all four quadrants,    no masses, no organomegaly  Extremities:   Extremities normal, atraumatic, no cyanosis or edema  Pulses:   2+ and symmetric all extremities  Skin:   Skin color, texture, turgor normal, no rashes or lesions  Lymph nodes:   Cervical, supraclavicular, and axillary nodes normal  Neurologic:   CNII-XII intact. Normal strength, sensation and reflexes      throughout

## 2010-12-20 NOTE — Assessment & Plan Note (Signed)
Check lipid profile

## 2010-12-20 NOTE — Assessment & Plan Note (Signed)
Suboptimally controlled,  contributed by pain Continue to follow Check creatinine

## 2010-12-20 NOTE — Assessment & Plan Note (Signed)
Pain is well controlled. Requests Vicodin refill. Is going to see sports medicine physician next week. We refill 30 tablets of Vicodin to last him for a week until he sees the sports medicine physician. I think he would benefit from nonnarcotic options at this time the Toradol, Tylenol etc. he will also benefit from physical therapy. We try this options when he returns to the clinic again.

## 2010-12-21 ENCOUNTER — Ambulatory Visit (INDEPENDENT_AMBULATORY_CARE_PROVIDER_SITE_OTHER): Payer: Medicare Other | Admitting: Family Medicine

## 2010-12-21 ENCOUNTER — Other Ambulatory Visit: Payer: Self-pay | Admitting: *Deleted

## 2010-12-21 VITALS — BP 130/80

## 2010-12-21 DIAGNOSIS — M25519 Pain in unspecified shoulder: Secondary | ICD-10-CM

## 2010-12-21 DIAGNOSIS — I428 Other cardiomyopathies: Secondary | ICD-10-CM

## 2010-12-21 DIAGNOSIS — E785 Hyperlipidemia, unspecified: Secondary | ICD-10-CM

## 2010-12-21 MED ORDER — FENOFIBRATE 160 MG PO TABS: 160.0000 mg | ORAL_TABLET | Freq: Every day | ORAL | Status: DC

## 2010-12-21 NOTE — Telephone Encounter (Signed)
Pt wants 3 months at a time, amts changed for approval

## 2010-12-21 NOTE — Assessment & Plan Note (Signed)
The patient reports that this pain is resolved.  He is not requiring any medication.  His activity is back to normal.

## 2010-12-21 NOTE — Progress Notes (Signed)
  Subjective:    [name], male    DOB: [birth year], 65 y.o.   MRN: [id number]  HPI 65 y/o male is here for follow up for shoulder/arm/elbow pain.  He was given a cortisone injection a few weeks ago when it first started.  He states that the pain is completely resolved.  He has no new complaints today.    Review of Systems     Objective:   Physical Exam Neck: Non-tender to palpation FROM  Shoulder. Palpation is normal with no tenderness over AC joint or bicipital groove. ROM is full in all planes. Rotator cuff strength normal throughout. No signs of impingement with negative Neer and Hawkin's tests, empty can. Speeds and Yergason's tests normal. No painful arc  Elbow: No tenderness to palpation No deformity or swelling Supinates and pronates without pain Flexes and extends fully        Assessment & Plan:

## 2011-01-09 ENCOUNTER — Ambulatory Visit: Payer: Medicare Other | Admitting: Rehabilitative and Restorative Service Providers"

## 2011-01-11 ENCOUNTER — Ambulatory Visit: Payer: Medicare Other | Attending: Internal Medicine

## 2011-01-24 NOTE — Progress Notes (Signed)
Addended by: Neomia Dear on: 01/24/2011 06:56 PM   Modules accepted: Orders

## 2011-01-31 ENCOUNTER — Telehealth: Payer: Self-pay | Admitting: Internal Medicine

## 2011-01-31 NOTE — Telephone Encounter (Signed)
Spoke with pharmacist named Rosanne Ashing [phone]  Rx-- [phone]  Called prior-aurth and spoke with Joeliene  Apr through  05/22/2011  $ 6.50 co pay #[id number] lmom with patients wife and let her know that medication is ready and can be picked up

## 2011-01-31 NOTE — Telephone Encounter (Signed)
Pt needs new auth for pradaxa , said last auth only good 1 year, [facility] [place], pt's wife requesting call when done

## 2011-02-03 ENCOUNTER — Other Ambulatory Visit: Payer: Self-pay | Admitting: Internal Medicine

## 2011-02-09 ENCOUNTER — Telehealth: Payer: Self-pay | Admitting: Physician Assistant

## 2011-02-09 NOTE — Telephone Encounter (Signed)
Ms Riggi called bec pt has had 2 episodes of CP, each relieved by 1 s/l NTG. He is currently pain-free. She wanted to know what to do. Advised her if he had any more CP, she should call 911 and have him brought in. If no more CP, OK to stay home tonight and ofc will call in am. She agreed with this plan.

## 2011-02-10 ENCOUNTER — Telehealth: Payer: Self-pay | Admitting: *Deleted

## 2011-02-10 LAB — LIPID PANEL
Cholesterol: 126
LDL Cholesterol: 84
Triglycerides: 119
VLDL: 24

## 2011-02-10 LAB — CK TOTAL AND CKMB (NOT AT ARMC)
CK, MB: 0.8
Relative Index: 0.6

## 2011-02-10 LAB — COMPREHENSIVE METABOLIC PANEL
ALT: 16
Albumin: 3.6
Alkaline Phosphatase: 44
GFR calc Af Amer: 47 — ABNORMAL LOW
Potassium: 3.8
Sodium: 137
Total Protein: 7

## 2011-02-10 LAB — POCT I-STAT CREATININE
Creatinine, Ser: 1.9 — ABNORMAL HIGH
Operator id: 294501

## 2011-02-10 LAB — BASIC METABOLIC PANEL
Calcium: 8.7
Creatinine, Ser: 1.69 — ABNORMAL HIGH
GFR calc non Af Amer: 42 — ABNORMAL LOW
Glucose, Bld: 98
Sodium: 140

## 2011-02-10 LAB — CULTURE, BLOOD (ROUTINE X 2)
Culture: NO GROWTH
Culture: NO GROWTH

## 2011-02-10 LAB — CBC
Hemoglobin: 12.9 — ABNORMAL LOW
Hemoglobin: 13.9
MCHC: 33.9
MCHC: 34.2
Platelets: 227
RBC: 4.58
RDW: 13.3

## 2011-02-10 LAB — CARDIAC PANEL(CRET KIN+CKTOT+MB+TROPI)
CK, MB: 0.8
CK, MB: 1.2
Relative Index: 0.9
Total CK: 138
Troponin I: 0.06

## 2011-02-10 LAB — B-NATRIURETIC PEPTIDE (CONVERTED LAB): Pro B Natriuretic peptide (BNP): 216 — ABNORMAL HIGH

## 2011-02-10 LAB — I-STAT 8, (EC8 V) (CONVERTED LAB)
Bicarbonate: 26.4 — ABNORMAL HIGH
Glucose, Bld: 124 — ABNORMAL HIGH
Hemoglobin: 14.6
Sodium: 139
TCO2: 28

## 2011-02-10 LAB — LEGIONELLA ANTIGEN, URINE: Legionella Antigen, Urine: NEGATIVE

## 2011-02-10 LAB — DIFFERENTIAL
Basophils Relative: 0
Eosinophils Absolute: 0
Monocytes Absolute: 1
Monocytes Relative: 7

## 2011-02-10 LAB — POCT CARDIAC MARKERS
CKMB, poc: <1 — ABNORMAL LOW
Troponin i, poc: <0.05

## 2011-02-10 NOTE — Telephone Encounter (Signed)
Pt's daughter wants to talk to you about father and whether she should bring him here or go to primary care dr

## 2011-02-10 NOTE — Telephone Encounter (Signed)
This note sent to message room so i 'm passing it along--nt

## 2011-02-13 LAB — CBC
HCT: 37.5 — ABNORMAL LOW
Hemoglobin: 12.9 — ABNORMAL LOW
MCV: 89.4
Platelets: 257
RBC: 4.19 — ABNORMAL LOW
WBC: 12 — ABNORMAL HIGH

## 2011-02-13 LAB — BASIC METABOLIC PANEL
Chloride: 109
GFR calc Af Amer: 57 — ABNORMAL LOW
GFR calc non Af Amer: 47 — ABNORMAL LOW
Potassium: 3.5
Sodium: 140

## 2011-02-14 LAB — BASIC METABOLIC PANEL
BUN: 10
BUN: 15
Calcium: 9.4
Chloride: 99
Creatinine, Ser: 1.4
GFR calc non Af Amer: 52 — ABNORMAL LOW
GFR calc non Af Amer: 56 — ABNORMAL LOW
Glucose, Bld: 81
Glucose, Bld: 92
Potassium: 3.7
Sodium: 135

## 2011-02-14 LAB — RENAL FUNCTION PANEL
Albumin: 3.5
BUN: 11
Calcium: 9.3
Creatinine, Ser: 1.41
Phosphorus: 3.3

## 2011-02-14 LAB — CBC
HCT: 42.4
HCT: 43.9
Hemoglobin: 15
MCV: 89.4
MCV: 89.5
Platelets: 266
Platelets: 269
RDW: 13.2
RDW: 13.6
WBC: 8.5

## 2011-02-14 LAB — HSV(HERPES SMPLX)ABS-I+II(IGG+IGM)-BLD
Herpes Simplex Vrs I + II Ab, IgG: 54.2 IV — ABNORMAL HIGH
Herpes Simplex Vrs I&II-IgM Ab (EIA): NOT DETECTED

## 2011-02-14 LAB — HEAVY METALS, BLOOD
Lead: 1.8 ug/dL (ref <10.0)
Mercury: 2.3 ug/L (ref <5.0)

## 2011-02-15 NOTE — Telephone Encounter (Signed)
Spoke with [name]'s daughter who states [name] has had an increase in SOB with activity.  He has no edema in his feet, legs or abdomen and has lost weight.  He does has a dry cough but daughter is unsure of how much is related to symptoms from his CVA.  [name] is not having any chest pain today but has in recent past as noted in chart.  Daughter would like to schedule an appointment with Dr Johney Frame and/or PA.  [name] is coming into office October 1 for a device check and would like to be seen then if possible as they live in [place] and it is difficult to make more than one trip.  Will review with Dennis Bast, RN for Dr Johney Frame.  Daughter would like a call back with appointment at [phone]

## 2011-02-15 NOTE — Telephone Encounter (Signed)
Pt scheduled for Monday at 11:30 am with Dr Johney Frame.  Daughter aware

## 2011-02-17 ENCOUNTER — Encounter: Payer: Self-pay | Admitting: Internal Medicine

## 2011-02-20 ENCOUNTER — Encounter: Payer: Self-pay | Admitting: Internal Medicine

## 2011-02-20 ENCOUNTER — Ambulatory Visit (INDEPENDENT_AMBULATORY_CARE_PROVIDER_SITE_OTHER)
Admission: RE | Admit: 2011-02-20 | Discharge: 2011-02-20 | Disposition: A | Discharge status: Home / Self Care | Payer: Medicare Other | Source: Ambulatory Visit | Attending: Internal Medicine | Admitting: Internal Medicine

## 2011-02-20 ENCOUNTER — Other Ambulatory Visit (INDEPENDENT_AMBULATORY_CARE_PROVIDER_SITE_OTHER): Payer: Medicare Other

## 2011-02-20 ENCOUNTER — Encounter: Payer: Medicare Other | Admitting: *Deleted

## 2011-02-20 ENCOUNTER — Ambulatory Visit (INDEPENDENT_AMBULATORY_CARE_PROVIDER_SITE_OTHER): Payer: Medicare Other | Admitting: Internal Medicine

## 2011-02-20 DIAGNOSIS — I4891 Unspecified atrial fibrillation: Secondary | ICD-10-CM

## 2011-02-20 DIAGNOSIS — I428 Other cardiomyopathies: Secondary | ICD-10-CM

## 2011-02-20 DIAGNOSIS — I5023 Acute on chronic systolic (congestive) heart failure: Secondary | ICD-10-CM

## 2011-02-20 DIAGNOSIS — R2689 Other abnormalities of gait and mobility: Secondary | ICD-10-CM

## 2011-02-20 DIAGNOSIS — R269 Unspecified abnormalities of gait and mobility: Secondary | ICD-10-CM

## 2011-02-20 DIAGNOSIS — N183 Chronic kidney disease, stage 3 unspecified: Secondary | ICD-10-CM

## 2011-02-20 DIAGNOSIS — I472 Ventricular tachycardia: Secondary | ICD-10-CM

## 2011-02-20 DIAGNOSIS — I4729 Other ventricular tachycardia: Secondary | ICD-10-CM

## 2011-02-20 LAB — ICD DEVICE OBSERVATION
AL IMPEDENCE ICD: 519 Ohm
AL THRESHOLD: 0.5 V
BAMS-0002: 16 ms
BAMS-0003: 70 {beats}/min
CHARGE TIME: 8.815 s
DEV-0020ICD: NEGATIVE
HV IMPEDENCE: 51 Ohm
TOT-0001: 7
TZAT-0004FASTVT: 8
TZAT-0012FASTVT: 210 ms
TZAT-0013FASTVT: 2
TZAT-0018FASTVT: NEGATIVE
TZAT-0020FASTVT: 1 ms
TZON-0004FASTVT: 2.5
TZST-0001FASTVT: 3
TZST-0001FASTVT: 5
TZST-0001FASTVT: 8
TZST-0003FASTVT: 11 J
TZST-0003FASTVT: 41 J
TZST-0003FASTVT: 41 J

## 2011-02-20 LAB — CBC WITH DIFFERENTIAL/PLATELET
Basophils Relative: 0.4 % (ref 0.0–3.0)
Eosinophils Absolute: 0.1 10*3/uL (ref 0.0–0.7)
HCT: 40.3 % (ref 39.0–52.0)
Lymphs Abs: 2.4 10*3/uL (ref 0.7–4.0)
MCHC: 32.8 g/dL (ref 30.0–36.0)
MCV: 93.8 fl (ref 78.0–100.0)
Monocytes Absolute: 0.7 10*3/uL (ref 0.1–1.0)
Neutrophils Relative %: 56.9 % (ref 43.0–77.0)
Platelets: 301 10*3/uL (ref 150.0–400.0)
RBC: 4.3 Mil/uL (ref 4.22–5.81)

## 2011-02-20 LAB — BASIC METABOLIC PANEL
BUN: 19 mg/dL (ref 6–23)
CO2: 29 mEq/L (ref 19–32)
Chloride: 106 mEq/L (ref 96–112)
Creatinine, Ser: 1.8 mg/dL — ABNORMAL HIGH (ref 0.4–1.5)
Potassium: 4.1 mEq/L (ref 3.5–5.1)

## 2011-02-20 MED ORDER — FUROSEMIDE 40 MG PO TABS: ORAL_TABLET | ORAL | Status: DC

## 2011-02-20 NOTE — Patient Instructions (Signed)
Your physician recommends that you schedule a follow-up appointment in: 1 week with Lilian Coma and 4 weeks with Dr Johney Frame  Your physician recommends that you return for lab work today at the [place] office and CXR  Your physician has recommended you make the following change in your medication: 1) Start Furosemide 40 mg --take one tablet twice daily for 3 days then decrease to once daily

## 2011-02-22 ENCOUNTER — Ambulatory Visit (INDEPENDENT_AMBULATORY_CARE_PROVIDER_SITE_OTHER): Payer: Medicare Other | Admitting: Internal Medicine

## 2011-02-22 DIAGNOSIS — N183 Chronic kidney disease, stage 3 unspecified: Secondary | ICD-10-CM

## 2011-02-22 DIAGNOSIS — R269 Unspecified abnormalities of gait and mobility: Secondary | ICD-10-CM

## 2011-02-22 DIAGNOSIS — R5383 Other fatigue: Secondary | ICD-10-CM

## 2011-02-22 DIAGNOSIS — I10 Essential (primary) hypertension: Secondary | ICD-10-CM

## 2011-02-22 DIAGNOSIS — Z23 Encounter for immunization: Secondary | ICD-10-CM

## 2011-02-22 DIAGNOSIS — R0602 Shortness of breath: Secondary | ICD-10-CM

## 2011-02-22 DIAGNOSIS — R2689 Other abnormalities of gait and mobility: Secondary | ICD-10-CM | POA: Insufficient documentation

## 2011-02-22 DIAGNOSIS — I129 Hypertensive chronic kidney disease with stage 1 through stage 4 chronic kidney disease, or unspecified chronic kidney disease: Secondary | ICD-10-CM

## 2011-02-22 DIAGNOSIS — R5381 Other malaise: Secondary | ICD-10-CM

## 2011-02-22 HISTORY — DX: Chronic kidney disease, stage 3 unspecified: N18.30

## 2011-02-22 LAB — VITAMIN B12: Vitamin B-12: 323 pg/mL (ref 211–911)

## 2011-02-22 LAB — BASIC METABOLIC PANEL
BUN: 20 mg/dL (ref 6–23)
Calcium: 10.5 mg/dL (ref 8.4–10.5)
Glucose, Bld: 86 mg/dL (ref 70–99)
Potassium: 4.2 mEq/L (ref 3.5–5.3)

## 2011-02-22 LAB — TSH: TSH: 1.692 u[IU]/mL (ref 0.350–4.500)

## 2011-02-22 MED ORDER — FUROSEMIDE 40 MG PO TABS: ORAL_TABLET | ORAL | Status: DC

## 2011-02-22 NOTE — Assessment & Plan Note (Addendum)
DD is broad. Will obtain CT head for acute process. Will obtain 2D Echo and evaluate for possible worsening Cardiomyopathy. Will check TSH especially since patient is on amiodarone and Vitamin B12. Depression should be considered especially in the setting of hx of CVA and multiple medical problem. Consider to  start medication at the next office visit . We have to be caution since antidepressive meds can cause dizziness.   Update 10/03: TSH normal

## 2011-02-22 NOTE — Progress Notes (Signed)
  Subjective:    [name], male    DOB: November 29, 1945, 65 y.o.   MRN: [id number]  HPI    Review of Systems     Objective:   Physical Exam        Assessment & Plan:

## 2011-02-22 NOTE — Assessment & Plan Note (Addendum)
Unclear ethiology. CXR on 10/01 was negative for pulmonary edema. Patient has no history of lung disease. Unlikely PE  with geneva score of low probability.  History of dilated cardiomyopathy s/p AICD  In place followed up my Dr Johney Frame (Cardiology). Will obtain 2D echo today. Last was performed in 2009.

## 2011-02-22 NOTE — Assessment & Plan Note (Addendum)
Should be referred to Nephrologist. Consider at the next office visit.   Update 10/03: noted to have acute on chronic renal failure. Will hold lasix. Will repeat Bmet.Patient may have to be admitted for further evaluation.

## 2011-02-22 NOTE — Assessment & Plan Note (Signed)
BP low today. Patient not Orthostatic . Will decrease Lasix to 40 mg daily. Will check Bmet to evaluate creatinine function since it was 1.8 on 10/01 which is around his baseline.

## 2011-02-22 NOTE — Progress Notes (Signed)
Subjective:   [name] male   DOB: Apr 18, 1946 65 y.o.   MRN: [id number]  HPI: [name] is a 65 y.o. male with PMH as outlined below who presented with imbalance, fatigue and SOB. 1. Imbalance: Patient's daughter noted that she noticed since now couple of weeks the patient had episodes of imbalance. He would stand up from a sitting position and would loose balance and he somebody has to hold on to him. One month ago she noted that his legs just gave up and he fell and hit his buttocks. He denies during this episodes dizziness, chest pain, palpitation or SOB.  2. Fatigue: daughter noted that the patient is feeling very tired and does not want to do to much. It seems that the  patient has not energy.  It seems that it is difficult to motivate the patient to do to much. It is progressively getting worse.  The daughter can not pin point when everything started. The patient denies any depressed mood. 3. SOB worse with exertion  and associated with CP. Was seen by Cardiology on 10/01. CXRay showed no Mild cardiomegaly without pulmonary edema. No acute cardiopulmonary disease. Stable examination. An ECG was performed during that office visit . Patient was started on Lasix 40 mg bid for 3 days then 40 mg daily. No other changes in medication    Past Medical History  Diagnosis Date  . Ischemic cardiomyopathy     EF 35-45% in 2009, on dual chamber defibrillator  . Myocardial infarction     in  1986 with pci  to circumflex  . Atrial fibrillation     rate controlled, on pradaxa for stroke prevention  . Hypertension   . Stroke     in 2009, left fronto-temporal, due to a-fib  . CHF (congestive heart failure)   . GERD (gastroesophageal reflux disease)   . HLD (hyperlipidemia)   . V-tach     episodes in past, on amiodarone now, followed by Dr. Johney Frame, dual chamber defibrillator  . Obstructive sleep apnea     sees Dr. Stanton Kidney  . DJD (degenerative joint disease)     of  shoulder,   Current Outpatient Prescriptions  Medication Sig Dispense Refill  . amiodarone (PACERONE) 200 MG tablet Take 200 mg by mouth daily.        Marland Kitchen aspirin 81 MG tablet Take 81 mg by mouth daily.        Marland Kitchen atorvastatin (LIPITOR) 80 MG tablet Take 80 mg by mouth daily.        . dabigatran (PRADAXA) 150 MG CAPS Take 1 capsule (150 mg total) by mouth every 12 (twelve) hours.  180 capsule  1  . diclofenac sodium (VOLTAREN) 1 % GEL Apply 1 application topically as needed.        . enalapril (VASOTEC) 20 MG tablet TAKE 1 TABLET BY MOUTH TWICE A DAY  180 tablet  0  . esomeprazole (NEXIUM) 40 MG capsule Take 1 capsule (40 mg total) by mouth daily before breakfast.  30 capsule  6  . fenofibrate 160 MG tablet Take 1 tablet (160 mg total) by mouth daily.  90 tablet  4  . furosemide (LASIX) 40 MG tablet Take one daily and as directed  45 tablet  6  . hydrOXYzine (ATARAX) 50 MG tablet TAKE 1 TABLET BY MOUTH THREE TIMES DAILY AS NEEDED  30 tablet  0  . metoprolol (TOPROL-XL) 50 MG 24 hr tablet Take 1 tablet (50 mg total) by  mouth daily.  30 tablet  6  . nitroGLYCERIN (NITROSTAT) 0.4 MG SL tablet Place 1 tablet (0.4 mg total) under the tongue every 5 (five) minutes as needed.  30 tablet  6  . ranitidine (ZANTAC) 150 MG tablet Take 1 tablet (150 mg total) by mouth at bedtime.  90 tablet  2  Review of Systems: Constitutional: Denies fever, chills, diaphoresis but noted appetite change and fatigue.  HEENT: Denies photophobia, eye pain, redness, hearing loss, ear pain, congestion, sore throat, rhinorrhea, sneezing, mouth sores, trouble swallowing, neck pain, neck stiffness and tinnitus.   Respiratory: noted  SOB and DOE but denies cough, chest tightness,  and wheezing.   Cardiovascular: Noted chest pain, palpitations and leg swelling.  Gastrointestinal: Denies nausea, vomiting, abdominal pain, diarrhea, constipation, blood in stool and abdominal distention.  Musculoskeletal:  gait problem.  Skin: Denies  pallor, rash and wound.  Neurological: Occasionally  dizziness, weakness and light-headedness but denies  seizures, syncope, numbness and headaches.  Psychiatric/Behavioral: Denies suicidal ideation, occasional  sleep disturbance  Objective:  Physical Exam: Filed Vitals:   02/22/11 0845  BP: 101/65  Pulse: 74  Temp: 98.7 F (37.1 C)  TempSrc: Oral  Height: 5\' 10"  (1.778 m)  Weight: 182 lb 3.2 oz (82.645 kg)   Constitutional: Vital signs reviewed.  Patient is a well-developed and well-nourished  in no acute distress and cooperative with exam. Alert and oriented x3.  Head: Normocephalic and atraumatic Ear: TM normal bilaterally Mouth: no erythema or exudates, MMM Eyes: PERRL, EOMI, conjunctivae normal, No scleral icterus.  Neck: Supple, Trachea midline normal ROM,  Cardiovascular: irregular , S1 normal, S2 normal,  pulses symmetric and intact bilaterally Pulmonary/Chest: CTAB, no wheezes, rales, or rhonchi Abdominal: Soft. Non-tender, non-distended, bowel sounds are normal, no masses, organomegaly, or guarding present.  GU: no CVA tenderness Musculoskeletal: No joint deformities, erythema, or stiffness, ROM full and no nontender Neurological: A&O x3, Strenght is decreased  Bilaterally in lower extremities to 4/5, cranial nerve II-XII are grossly intact,sensory intact to light touch bilaterally.  Skin: Warm, dry and intact. Marland Kitchen  Psychiatric:  Normal mood but decreased affect. speech and behavior is slow.

## 2011-02-22 NOTE — Assessment & Plan Note (Addendum)
New onset. Unclear etiology. Suspicious was high that patient may has orthostatic BP but it was normal. At this point it is concerning that patient may had another CVA event although not acute.   Especially in the setting of overall changes in mental status. Patient is currently  on Pradaxa and Aspirin. Other DD include carotid stenosis causing him episodes of dizziness in the setting of low blood pressure.  Will also obtain Vitamin B12 level today. Will have the patient come in on Friday for re-evalution . Hopefully we will have results back at that time to discuss further management.   Update 10/03: Vitamin B12 level normal.

## 2011-02-23 ENCOUNTER — Telehealth: Payer: Self-pay | Admitting: Internal Medicine

## 2011-02-23 ENCOUNTER — Other Ambulatory Visit: Payer: Self-pay | Admitting: Internal Medicine

## 2011-02-23 ENCOUNTER — Ambulatory Visit (HOSPITAL_COMMUNITY)
Admission: RE | Admit: 2011-02-23 | Discharge: 2011-02-23 | Disposition: A | Discharge status: Home / Self Care | Payer: Medicare Other | Source: Ambulatory Visit | Attending: Internal Medicine | Admitting: Internal Medicine

## 2011-02-23 DIAGNOSIS — I059 Rheumatic mitral valve disease, unspecified: Secondary | ICD-10-CM

## 2011-02-23 DIAGNOSIS — R0602 Shortness of breath: Secondary | ICD-10-CM

## 2011-02-23 DIAGNOSIS — R42 Dizziness and giddiness: Secondary | ICD-10-CM

## 2011-02-23 DIAGNOSIS — N183 Chronic kidney disease, stage 3 unspecified: Secondary | ICD-10-CM

## 2011-02-23 DIAGNOSIS — R2689 Other abnormalities of gait and mobility: Secondary | ICD-10-CM

## 2011-02-23 NOTE — Telephone Encounter (Signed)
Patient was noted to have acute on chronic renal failure . Will d/c Lasix. Patient will has an appointment tomorrow (10/5) at 9:15 in the clinic. Will recheck Bmet. If creatinine continues to be elevated patient may need to be admitted. Called home phone and mobile and left a message.

## 2011-02-23 NOTE — Telephone Encounter (Signed)
Returned call to patient --okay to stop wife aware

## 2011-02-23 NOTE — Telephone Encounter (Signed)
Pt saw pcp yesterday and called this am with results of kidney function test, was low, was told to stop taking fluid pill, doesn't want to do this without ok from allred

## 2011-02-24 ENCOUNTER — Inpatient Hospital Stay (HOSPITAL_COMMUNITY)
Admission: AD | Admit: 2011-02-24 | Discharge: 2011-02-25 | DRG: 683 | Disposition: A | Discharge status: Home / Self Care | Payer: Medicare Other | Source: Ambulatory Visit | Attending: Infectious Diseases | Admitting: Infectious Diseases

## 2011-02-24 ENCOUNTER — Telehealth: Payer: Self-pay | Admitting: Internal Medicine

## 2011-02-24 ENCOUNTER — Ambulatory Visit (INDEPENDENT_AMBULATORY_CARE_PROVIDER_SITE_OTHER): Payer: Medicare Other | Admitting: Internal Medicine

## 2011-02-24 ENCOUNTER — Encounter: Payer: Self-pay | Admitting: Internal Medicine

## 2011-02-24 ENCOUNTER — Ambulatory Visit (HOSPITAL_COMMUNITY)
Admission: RE | Admit: 2011-02-24 | Discharge: 2011-02-24 | Disposition: A | Discharge status: Home / Self Care | Payer: Medicare Other | Source: Ambulatory Visit | Attending: Internal Medicine | Admitting: Internal Medicine

## 2011-02-24 DIAGNOSIS — I2510 Atherosclerotic heart disease of native coronary artery without angina pectoris: Secondary | ICD-10-CM | POA: Diagnosis present

## 2011-02-24 DIAGNOSIS — I129 Hypertensive chronic kidney disease with stage 1 through stage 4 chronic kidney disease, or unspecified chronic kidney disease: Secondary | ICD-10-CM | POA: Diagnosis present

## 2011-02-24 DIAGNOSIS — M1990 Unspecified osteoarthritis, unspecified site: Secondary | ICD-10-CM | POA: Diagnosis present

## 2011-02-24 DIAGNOSIS — R42 Dizziness and giddiness: Secondary | ICD-10-CM | POA: Diagnosis present

## 2011-02-24 DIAGNOSIS — I428 Other cardiomyopathies: Secondary | ICD-10-CM

## 2011-02-24 DIAGNOSIS — N183 Chronic kidney disease, stage 3 unspecified: Secondary | ICD-10-CM | POA: Diagnosis present

## 2011-02-24 DIAGNOSIS — G4733 Obstructive sleep apnea (adult) (pediatric): Secondary | ICD-10-CM | POA: Diagnosis present

## 2011-02-24 DIAGNOSIS — R2689 Other abnormalities of gait and mobility: Secondary | ICD-10-CM

## 2011-02-24 DIAGNOSIS — R0602 Shortness of breath: Secondary | ICD-10-CM

## 2011-02-24 DIAGNOSIS — N179 Acute kidney failure, unspecified: Principal | ICD-10-CM | POA: Diagnosis present

## 2011-02-24 DIAGNOSIS — Z01812 Encounter for preprocedural laboratory examination: Secondary | ICD-10-CM

## 2011-02-24 DIAGNOSIS — E785 Hyperlipidemia, unspecified: Secondary | ICD-10-CM | POA: Diagnosis present

## 2011-02-24 DIAGNOSIS — I4891 Unspecified atrial fibrillation: Secondary | ICD-10-CM | POA: Diagnosis present

## 2011-02-24 DIAGNOSIS — Z8673 Personal history of transient ischemic attack (TIA), and cerebral infarction without residual deficits: Secondary | ICD-10-CM

## 2011-02-24 DIAGNOSIS — I252 Old myocardial infarction: Secondary | ICD-10-CM

## 2011-02-24 DIAGNOSIS — I509 Heart failure, unspecified: Secondary | ICD-10-CM | POA: Diagnosis present

## 2011-02-24 DIAGNOSIS — K219 Gastro-esophageal reflux disease without esophagitis: Secondary | ICD-10-CM | POA: Diagnosis present

## 2011-02-24 DIAGNOSIS — R269 Unspecified abnormalities of gait and mobility: Secondary | ICD-10-CM

## 2011-02-24 LAB — BASIC METABOLIC PANEL
BUN: 26 mg/dL — ABNORMAL HIGH (ref 6–23)
Calcium: 9.9 mg/dL (ref 8.4–10.5)
Chloride: 103 mEq/L (ref 96–112)
Creat: 2.96 mg/dL — ABNORMAL HIGH (ref 0.50–1.35)

## 2011-02-24 LAB — URINALYSIS, MICROSCOPIC ONLY
Bilirubin Urine: NEGATIVE
Glucose, UA: NEGATIVE mg/dL
Hgb urine dipstick: NEGATIVE
Ketones, ur: NEGATIVE mg/dL
Protein, ur: NEGATIVE mg/dL
Urobilinogen, UA: 2 mg/dL — ABNORMAL HIGH (ref 0.0–1.0)

## 2011-02-24 LAB — CARDIAC PANEL(CRET KIN+CKTOT+MB+TROPI)
Relative Index: 1.3 (ref 0.0–2.5)
Troponin I: <0.3 ng/mL (ref <0.30)

## 2011-02-24 LAB — PRO B NATRIURETIC PEPTIDE: Pro B Natriuretic peptide (BNP): 316.2 pg/mL — ABNORMAL HIGH (ref 0–125)

## 2011-02-24 LAB — CREATININE, URINE, RANDOM: Creatinine, Urine: 217.05 mg/dL

## 2011-02-24 LAB — TSH: TSH: 0.977 u[IU]/mL (ref 0.350–4.500)

## 2011-02-24 NOTE — Assessment & Plan Note (Signed)
Acute on chronic renal failure worsening at this point. Will admit patient for further evaluation and management.

## 2011-02-24 NOTE — Telephone Encounter (Signed)
Pt wife calling "freaking" out. Pt was coming to see Dr. Johney Frame, pt thought he had appt, and pt was told that everything was doing ok. Pt was then sent to internal medicine and pt was told he has heart failure and kidney failure and would be admitted to the hospital. Pt is on the "elderly" floor not on the cardiac floor where he usually is.   Almost every time he sees a new MD a internal medicine clinic they admit pt into the hospital. Pt wife said this is the third time.   Pt and pt wife would like to speak with nurse to discuss what is going on. Please return call to discuss further.

## 2011-02-24 NOTE — Assessment & Plan Note (Signed)
CHF exacerbation possible. 2D Echo showed  Systolic function was severely reduced. The estimated ejection fraction was in the range of 20% to 25%. Diffuse hypokinesis. Doppler parameters are consistent with abnormal left ventricular relaxation (grade 1 diastolic dysfunction). Patient has ICD in place and followed up by Cardiology. Patient was started on lasix but now patient has worsening renal function. Therefore lasix is held. Will admit patient for further evaluation and management.

## 2011-02-24 NOTE — H&P (Signed)
Hospital Admission Note Date: 02/24/2011  Patient name: [name] Medical record number: [id number] Date of birth: 02/11/1946 Age: 65 y.o. Gender: male PCP: Bethel Born, MD  Medical Service: [facility]  Attending physician:  Dr. Maurice March    Resident [phone]): Dr. Dorthula Rue   Pager: [phone] Acting Intern: Dory Horn, MS4   Pager: [phone]  Chief Complaint: Increasing creatinine suggestive of acute renal failure  History of Present Illness: Mr Faiola is a 65 year old gentleman with history of CKD stage III, CHF, CAD, stroke, HTN, and Afib, who presents with a rapidly rising creatinine value, rising from 1.8 on 10/1 to 2.88 on 10/3 and remained elevated on 10/5 at 2.96. Creatinine is elevated at baseline and ranges from 1.7 to 1.9. Mr Shostak had a visit with his cardiologist on Monday 10/1 for increasing shortness of breath. A CXR done on 10/1 for SOB found mild cardiomegaly but no pulmonary edema. On clinical exam Mr Rowe was found to have fluid on his lungs and was prescribed Lasix 40mg  BID; his creatinine at the time was 1.8. Two days later at his internal medicine outpatient visit his creatinine was found to be 2.88 and his Lasix was discontinued as it was believed to be the cause. Mr Simmering has not recently experienced vigorous physical activity.  Mr. Brill wife states that his SOB has been worsening some recently. He becomes tired after climbing about 13 steps and requires lying down. He can walk about 1-2 flat city blocks before becoming short of breath. He denies any chest pain currently, but has had some intermittently in the past few weeks that was relieved by nitroglycerin, most recently on Friday 9/28. He described the pain as a light, dull pain. He does acknowledge dietary indiscretions including eating a salty diet recently. He says that he sleeps on two "flat pillows" and denies waking at night short of breath. He also denies any recent swelling.  The lasix increased  his urinary frequency, but he states that since stopping lasix he has returned to baseline frequency. Denies burning with urinary or hematuria.  Review of systems: Mr Blahut does acknowledge a chronic, non-productive cough. He denies fever, chills, nausea, recent illness, abdominal pain, diarrhea, or blood in his stools or urine. Mr. Scheck has had about two falls in the last month, most recently last week when getting out of bed; he is being worked up for poor balance as an outpatient.  Medications: Hydroxyzine 25mg  PO daily qHS Ranitidine 150mg  PO daily at bedtime Nitrogylcerin 0.4mg  1 tab PO PRN  Chest pain Metoprolol XL 50 mg PO q day Fenofibrate 160 mg PO q evening Lipitor 80 mg PO daily at bedtime voltaren 1% gel topically PRN Amiodarone 200 mg PO 1 tab daily at bedtime Pradaxa 150mg  PO 1 capsule twice daily nexium 40mg  1 capsule daily Enalapril 20mg  PO 1 tab twice daily ASA 81mg  once daily  Allergies: Codeine and Pantoprazole sodium  Past Medical History  Diagnosis Date  . Ischemic cardiomyopathy     EF 35-45% in 2009, on dual chamber defibrillator  . Myocardial infarction     in  1986 with pci  to circumflex  . Atrial fibrillation     rate controlled, on pradaxa for stroke prevention  . Hypertension   . Stroke     in 2009, left fronto-temporal, due to a-fib  . CHF (congestive heart failure)   . GERD (gastroesophageal reflux disease)   . HLD (hyperlipidemia)   . V-tach     episodes in  past, on amiodarone now, followed by Dr. Johney Frame, dual chamber defibrillator  . Obstructive sleep apnea     sees Dr. Stanton Kidney  . DJD (degenerative joint disease)     of shoulder,  . CARDIOMYOPATHY, DILATED 04/12/2007  . Veritgo   . CKD (chronic kidney disease), stage III 02/22/2011    Past Surgical History  Procedure Date  . Vocal cord surgery   . Icd implantation   . Hernia repair   . Angioplasty     Family History  Problem Relation Age of Onset  . Heart disease Father    Both father and mother passed away from MIs at ages 86 and 9 respectively.  History  Social Hx: Married; lives with wife and 70 year old daughter in [place], [place]. Currently retired. 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 Acknowledges history of chain smoking from age [age], but stopped after MI in 1986; started with 1PPD but by time he quit he was at 2.5 PPD. Denies alcohol or drug use in past or present.  Review of Systems: As per HPI.  Physical Exam: Vitals: T 97.2  P 60  RR 20  BP 142/75 O2sat: 98% room air  General Appearance:    Alert, cooperative, no distress, appears stated age, of average build and not particularly muscular  Head:    Normocephalic, without obvious abnormality, atraumatic  Eyes:    PERRL, sclera clear, EOM's intact  Nose:   Nares normal, septum midline, mucosa normal, no drainage  Throat:   Lips, mucosa, and tongue normal and moist; gums normal, no teeth instead uses dentures.  Neck:   Supple, symmetrical, trachea midline, No enlargement/tenderness; no JVD  Back:     Symmetric, no curvature, no CVA tenderness  Lungs:     Clear to auscultation bilaterally aside from very slight basilar crackles, respirations unlabored  Chest wall:    No tenderness or deformity  Heart:    Regular rate and rhythm, S1 and S2 normal, no murmur, rub, or gallop  Abdomen:     Soft, non-tender, bowel sounds active all four quadrants,    no masses, no organomegaly  Extremities:   Extremities normal, atraumatic, no cyanosis or edema  Skin:   Skin color, texture, turgor normal, no rashes or lesions  Neurologic:   CNII-XII grossly intact. Normal strength 5/5 through upper and lower extremities. Romberg sign negative.    Lab results: BMET: Component     Latest Ref Rng 02/20/2011 02/22/2011 02/24/2011  Sodium     135 - 145 mEq/L 142 142 142  Potassium     3.5 - 5.3 mEq/L 4.1 4.2 3.9  Chloride     96 - 112 mEq/L 106 101 103  CO2     19 - 32 mEq/L 29 31 33 (H)    Glucose     70 - 99 mg/dL 79 86 87  BUN     6 - 23 mg/dL 19 20 26  (H)  Creat     0.4 - 1.5 mg/dL 1.8 (H) 5.40 (H)   9.81 (H)  Calcium     8.4 - 10.5 mg/dL 8.9 19.1 9.9    Imaging results:  Recent imaging outside of the hospital: - Carotid dopplers were performed on 10/3 to investigate potential causes of poor balance; the read on the study is still pending.  - Non-contrast head CT on 10/5 for poor balance which found left frontal and temporal encephalomalacia from prior infarct, but no acute abnormality.  - A 2-D  ECHO was done on 10/4 for SOB and found mild dilation of the LV with ejection fraction of 20-25%, as well as grade 1 diastolic dysfuction and mild mitral regurgitation.   Assessment & Plan by Problem: Mr Rybicki is a 65 year old gentleman with history of CKD stage III, CHF, CAD, stroke, HTN, and Afib, who presents with a creatinine increase from 1.8 to 2.88 over a two day period suggestive of acute renal failure.  #Acute on Chronic Renal Failure: - Most likely due to Lasix. No other medication or major lifestyle changes occurred between 10/1 and 10/3. On exam pt does not appear to be retaining fluid (no pulmonary edema, peripheral edema, or JVD). Pt does not appear dehydrated, no CVA tenderness. - Have ordered urine creatinine, urine sodium, and UA with sediment  to better understand etiology - Holding home Enalapril dose to reduce risk to kidneys - Will check CMET and CBC in AM labs.  - IVF NS @ 100cc/hr q 8 hrs and then NSL  #CHF - Does not appear to be acutely decompensated. Holding Enapril for improved renal function. - have ordered BNP, EKG now and in AM  #CAD: - nitrogylcerin 0.4mg  SL q5 mins PRN chest pain (not more than 3 tabs) - Cont ASA 81mg   #Afib/ hx of stroke - stable, cont outpt management regimen - Cont pradaxa 150mg  PO BID - Cont amiodarone 200mg  po daily  #GERD - stable, nexium 40mg  po daily  #HTN - holding Enalapril for kidney function; will  continue metoprolol XL 50mg  qday  #Dyslipidemia - cont Lipitor 80mg  PO qHS

## 2011-02-24 NOTE — Telephone Encounter (Signed)
I spoke with the pt's wife and she said that the Internal Medicine MD had been in the room and explained that the pt was sent to the hospital due to worsening kidney function.

## 2011-02-24 NOTE — Progress Notes (Signed)
  Subjective:    [name], male    DOB: 1946-04-04, 65 y.o.   MRN: [id number]  HPI  This is a 65 year old male with significant Past medical history for CAD, CRI, CVA, CHF with ICD in place who was seen on 02/21/2011 for SOB and imbalance. Patient continues to be SOB and  imbalance.  CXR: No significant pulmonary edema 2Decho: EF 20-25 % Carotid doppler: normal CT head without contrast: no acute process Vitamin B12 within normal limits Worsening creatine function 1.8>2.8>2.9   Review of Systems  Constitutional: Positive for fatigue.  Respiratory: Positive for apnea and shortness of breath.   Cardiovascular: Positive for palpitations.  Gastrointestinal: Negative for abdominal pain, blood in stool and abdominal distention.  Genitourinary: Negative for difficulty urinating.  Musculoskeletal: Positive for arthralgias.  Neurological: Positive for dizziness, weakness and light-headedness. Negative for syncope and headaches.       Objective:   Physical Exam  Constitutional: He is oriented to person, place, and time. He appears well-developed and well-nourished. No distress.  Neck: Neck supple.  Pulmonary/Chest: Effort normal. No respiratory distress. He has rales. He exhibits no tenderness.  Abdominal: Soft. Bowel sounds are normal.  Musculoskeletal: Normal range of motion.  Neurological: He is alert and oriented to person, place, and time.  Skin: He is not diaphoretic.          Assessment & Plan:

## 2011-02-25 DIAGNOSIS — N179 Acute kidney failure, unspecified: Secondary | ICD-10-CM

## 2011-02-25 DIAGNOSIS — N189 Chronic kidney disease, unspecified: Secondary | ICD-10-CM

## 2011-02-25 DIAGNOSIS — I509 Heart failure, unspecified: Secondary | ICD-10-CM

## 2011-02-25 LAB — CBC
HCT: 40.2 % (ref 39.0–52.0)
Hemoglobin: 13.7 g/dL (ref 13.0–17.0)
MCHC: 34.1 g/dL (ref 30.0–36.0)
WBC: 6.1 10*3/uL (ref 4.0–10.5)

## 2011-02-25 LAB — COMPREHENSIVE METABOLIC PANEL
ALT: 29 U/L (ref 0–53)
Albumin: 3.8 g/dL (ref 3.5–5.2)
Alkaline Phosphatase: 45 U/L (ref 39–117)
BUN: 20 mg/dL (ref 6–23)
Chloride: 104 mEq/L (ref 96–112)
GFR calc Af Amer: 37 mL/min — ABNORMAL LOW (ref >90)
Glucose, Bld: 86 mg/dL (ref 70–99)
Potassium: 3.9 mEq/L (ref 3.5–5.1)
Sodium: 140 mEq/L (ref 135–145)
Total Bilirubin: 0.6 mg/dL (ref 0.3–1.2)
Total Protein: 6.9 g/dL (ref 6.0–8.3)

## 2011-02-26 ENCOUNTER — Encounter: Payer: Self-pay | Admitting: Internal Medicine

## 2011-02-26 NOTE — Progress Notes (Signed)
The patient presents today for routine electrophysiology followup.  Since last being seen in our clinic, the patient reports doing reasonably well.  He reports recent increase in dypsnea.  He reports SOB with moderate activity.  He also reports occasional unsteadiness.  Today, he denies symptoms of palpitations, chest pain, dizziness, presyncope, syncope, ICD shocks, or neurologic sequela.  The patient feels that he is tolerating medications without difficulties and is otherwise without complaint today.   Past Medical History  Diagnosis Date  . Ischemic cardiomyopathy     EF 35-45% in 2009  . Myocardial infarction     in  1986 with pci  to circumflex  . Atrial fibrillation     on pradaxa for stroke prevention  . Hypertension   . Stroke     in 2009, left fronto-temporal, due to a-fib  . Chronic systolic dysfunction of left ventricle   . GERD (gastroesophageal reflux disease)   . HLD (hyperlipidemia)   . Ventricular tachycardia     prior VT storm treated with amiodarone, followed by Dr. Johney Frame, dual chamber defibrillator  . Obstructive sleep apnea     sees Dr. Stanton Kidney  . DJD (degenerative joint disease)     of shoulder,  . CKD (chronic kidney disease), stage III 02/22/2011   Past Surgical History  Procedure Date  . Vocal cord surgery   . Icd implantation   . Hernia repair   . Angioplasty     Current Outpatient Prescriptions  Medication Sig Dispense Refill  . amiodarone (PACERONE) 200 MG tablet Take 200 mg by mouth daily.        Marland Kitchen aspirin 81 MG tablet Take 81 mg by mouth daily.        Marland Kitchen atorvastatin (LIPITOR) 80 MG tablet Take 80 mg by mouth daily.        . dabigatran (PRADAXA) 150 MG CAPS Take 1 capsule (150 mg total) by mouth every 12 (twelve) hours.  180 capsule  1  . diclofenac sodium (VOLTAREN) 1 % GEL Apply 1 application topically as needed.        . enalapril (VASOTEC) 20 MG tablet TAKE 1 TABLET BY MOUTH TWICE A DAY  180 tablet  0  . esomeprazole (NEXIUM) 40 MG capsule  Take 1 capsule (40 mg total) by mouth daily before breakfast.  30 capsule  6  . fenofibrate 160 MG tablet Take 1 tablet (160 mg total) by mouth daily.  90 tablet  4  . metoprolol (TOPROL-XL) 50 MG 24 hr tablet Take 1 tablet (50 mg total) by mouth daily.  30 tablet  6  . nitroGLYCERIN (NITROSTAT) 0.4 MG SL tablet Place 1 tablet (0.4 mg total) under the tongue every 5 (five) minutes as needed.  30 tablet  6  . ranitidine (ZANTAC) 150 MG tablet Take 1 tablet (150 mg total) by mouth at bedtime.  90 tablet  2    Allergies  Allergen Reactions  . Codeine   . Pantoprazole Sodium     REACTION: Reaction not known    History   Social History  . Marital Status: Married    Spouse Name: N/A    Number of Children: N/A  . Years of Education: N/A   Occupational History  . Retired    Social History Main Topics  . Smoking status: Former Games developer  . Smokeless tobacco: Never Used  . Alcohol Use: No  . Drug Use: No  . Sexually Active: Not on file   Other Topics Concern  .  Not on file   Social History Narrative   ICD-[employer] Remote- YesFinancial Assistance:  Application initiated.  Patient needs to submit further paperwork to complete per Rudell Cobb 02/18/2010.Financial Assistance: approved for 100% discount after Medicare pays for [facility] only, not eligible for [facility] card per Rudell Cobb 04/29/10.    Family History  Problem Relation Age of Onset  . Heart disease Father     ROS-  All systems are reviewed and are negative except as outlined in the HPI above   Physical Exam: Filed Vitals:   02/20/11 1143  BP: 149/78  Pulse: 54  Height: 5\' 9"  (1.753 m)  Weight: 186 lb 12 oz (84.709 kg)    GEN- The patient is chronically ill appearing, alert and oriented x 3 today.   Head- normocephalic, atraumatic Eyes-  Sclera clear, conjunctiva pink Ears- hearing intact Oropharynx- clear Neck- supple, JVP 10cm Lymph- no cervical lymphadenopathy Lungs- few bibasilar rales,  normal work of  breathing Chest- ICD pocket is well healed Heart- Regular rate and rhythm, PMI not laterally displaced GI- soft, NT, ND, + BS Extremities- no clubbing, cyanosis, +1 edema MS- BLE muscle atrophy Skin- no rash or lesion Psych- euthymic mood, full affect Neuro- strength and sensation are intact  ICD interrogation- reviewed in detail today,  See PACEART report  ekg today reveals atrial paced rhythm 60 bpm, incomplete LBBB, PVCs  Assessment and Plan:

## 2011-02-26 NOTE — Assessment & Plan Note (Signed)
Normal ICD function See Pace Art report No changes today  Continue amiodarone

## 2011-02-26 NOTE — Assessment & Plan Note (Signed)
The patient reports unsteadiness and weakness.  No focal changes on exam today.  I will check BMET and CBC to evaluate for metabolic causes of this.  He will need to follow-up closely with his primary care physician.

## 2011-02-26 NOTE — Assessment & Plan Note (Signed)
Given renal failure, we will have to be very careful with pradaxa. Check CrCl today to make sure he is on the correct dose.  Also check CBC

## 2011-02-26 NOTE — Assessment & Plan Note (Signed)
pradaxa for stroke prevention We will check Creatinine clearance today for appropriate dosing.

## 2011-02-26 NOTE — Assessment & Plan Note (Signed)
The patient is mildly volume overloaded today.  Salt restriction is advised.  Increase lasix to 40mg  BID x 3 days, then return to 40mg  daily.  We will check CXR today as well as a bmet.  He will be scheduled to follow-up with Tereso Newcomer later this week.

## 2011-02-27 ENCOUNTER — Telehealth: Payer: Self-pay | Admitting: Internal Medicine

## 2011-02-27 NOTE — Telephone Encounter (Signed)
Pt wife calling about pt d/c from hospital pt was taken off enalapril b/c it was messing with pt kidneys. Pt wife just wanted to make sure nurse/MD were informed that pt stopped taking enalapril per hospital d/c instructions.    Please call pt home if no answer on cell: [phone]

## 2011-02-27 NOTE — Telephone Encounter (Signed)
Spoke with wife and let her know Dr Johney Frame aware that the medication was stopped

## 2011-03-02 ENCOUNTER — Encounter: Payer: Self-pay | Admitting: Internal Medicine

## 2011-03-02 ENCOUNTER — Ambulatory Visit: Payer: Medicare Other | Admitting: Physician Assistant

## 2011-03-02 ENCOUNTER — Ambulatory Visit (INDEPENDENT_AMBULATORY_CARE_PROVIDER_SITE_OTHER): Payer: Medicare Other | Admitting: Internal Medicine

## 2011-03-02 DIAGNOSIS — I509 Heart failure, unspecified: Secondary | ICD-10-CM

## 2011-03-02 DIAGNOSIS — I4891 Unspecified atrial fibrillation: Secondary | ICD-10-CM

## 2011-03-02 DIAGNOSIS — N183 Chronic kidney disease, stage 3 unspecified: Secondary | ICD-10-CM

## 2011-03-02 DIAGNOSIS — I10 Essential (primary) hypertension: Secondary | ICD-10-CM

## 2011-03-02 DIAGNOSIS — N289 Disorder of kidney and ureter, unspecified: Secondary | ICD-10-CM

## 2011-03-02 DIAGNOSIS — I5023 Acute on chronic systolic (congestive) heart failure: Secondary | ICD-10-CM

## 2011-03-02 DIAGNOSIS — I472 Ventricular tachycardia: Secondary | ICD-10-CM

## 2011-03-02 DIAGNOSIS — I4729 Other ventricular tachycardia: Secondary | ICD-10-CM

## 2011-03-02 DIAGNOSIS — I5022 Chronic systolic (congestive) heart failure: Secondary | ICD-10-CM

## 2011-03-02 LAB — BASIC METABOLIC PANEL
Calcium: 9.7 mg/dL (ref 8.4–10.5)
Glucose, Bld: 104 mg/dL — ABNORMAL HIGH (ref 70–99)
Sodium: 141 mEq/L (ref 135–145)

## 2011-03-02 MED ORDER — ISOSORBIDE MONONITRATE ER 30 MG PO TB24: ORAL_TABLET | ORAL | Status: DC

## 2011-03-02 MED ORDER — HYDRALAZINE HCL 25 MG PO TABS: 25.0000 mg | ORAL_TABLET | Freq: Two times a day (BID) | ORAL | Status: DC

## 2011-03-02 NOTE — Patient Instructions (Signed)
Return to clinic in 2-3 months

## 2011-03-02 NOTE — Assessment & Plan Note (Signed)
Understands when and how he should take his Lasix No symptoms of decompensated CHF at this time

## 2011-03-02 NOTE — Progress Notes (Signed)
65 year old man with past medical history of recent admission to the hospital for acute on chronic kidney disease, congestive heart failure EF 35%, coronary artery disease, stroke, hypertension, A. fib, GERD and hyperlipidemia comes for hospital followup. Was discharged on 02/25/2011. Was asked to stop taking his ACE inhibitor and reduced dose of Lasix to 20 mg if weight increases by 5 pounds. He is being clinically fine since discharge. He does have exertional shortness of breath which has got little worse since stopping Lasix but he is still able to tolerate a little bit of exercise. No chest pain, shortness of breath at rest, legs swelling, cough or other complaints. He seen his cardiologist today.  Physical exam BP 156/87  Pulse 42  Temp(Src) 97.5 F (36.4 C) (Oral)  Ht 5\' 9"  (1.753 m)  Wt 188 lb (85.276 kg)  BMI 27.76 kg/m2  General Appearance:    Alert, cooperative, no distress, appears stated age  Head:    Normocephalic, without obvious abnormality, atraumatic  Eyes:    PERRL, conjunctiva/corneas clear, EOM's intact, fundi    benign, both eyes       Ears:    Normal TM's and external ear canals, both ears  Nose:   Nares normal, septum midline, mucosa normal, no drainage   or sinus tenderness  Throat:   Lips, mucosa, and tongue normal; teeth and gums normal  Neck:   Supple, symmetrical, trachea midline, no adenopathy;       thyroid:  No enlargement/tenderness/nodules; no carotid   bruit or JVD  Back:     Symmetric, no curvature, ROM normal, no CVA tenderness  Lungs:     Clear to auscultation bilaterally, respirations unlabored  Chest wall:    No tenderness or deformity  Heart:    Regular rate and rhythm, S1 and S2 normal, no murmur, rub   or gallop  Abdomen:     Soft, non-tender, bowel sounds active all four quadrants,    no masses, no organomegaly  Extremities:   Extremities normal, atraumatic, no cyanosis or edema  Pulses:   2+ and symmetric all extremities  Skin:   Skin  color, texture, turgor normal, no rashes or lesions  Lymph nodes:   Cervical, supraclavicular, and axillary nodes normal  Neurologic:   CNII-XII intact. Normal strength, sensation and reflexes      throughout   Review of systems  Constitutional: Denies fever, chills, diaphoresis, appetite change and fatigue.  HEENT: Denies photophobia, eye pain, redness, hearing loss, ear pain, congestion, sore throat, rhinorrhea, sneezing, mouth sores, trouble swallowing, neck pain, neck stiffness and tinnitus.   Respiratory: Denies SOB at rest, complains of DOE, no cough, chest tightness,  and wheezing.   Cardiovascular: Denies chest pain, palpitations and leg swelling.  Gastrointestinal: Denies nausea, vomiting, abdominal pain, diarrhea, constipation, blood in stool and abdominal distention.  Genitourinary: Denies dysuria, urgency, frequency, hematuria, flank pain and difficulty urinating.  Musculoskeletal: Denies myalgias, back pain, joint swelling, arthralgias and gait problem.  Skin: Denies pallor, rash and wound.  Neurological: Denies dizziness, seizures, syncope, weakness, light-headedness, numbness and headaches.  Hematological: Denies adenopathy. Easy bruising, personal or family bleeding history  Psychiatric/Behavioral: Denies suicidal ideation, mood changes, confusion, nervousness, sleep disturbance and agitation

## 2011-03-02 NOTE — Assessment & Plan Note (Addendum)
Recheck Bmet Continue holding ACE

## 2011-03-02 NOTE — Patient Instructions (Addendum)
Your physician recommends that you schedule a follow-up appointment in: keep follow appointment as scheduled  Your physician has recommended you make the following change in your medication:  1) start Imdur 30mg  1/2 tablet twice daily 2) start Hydralazine 25mg  twice daily

## 2011-03-03 ENCOUNTER — Ambulatory Visit: Payer: Self-pay | Admitting: Physician Assistant

## 2011-03-05 ENCOUNTER — Encounter: Payer: Self-pay | Admitting: Internal Medicine

## 2011-03-05 NOTE — Progress Notes (Signed)
HPI [name] returns today for followup. He is a pleasant 65 yo man with an ICM, chronic stage 3 renal failure, PAF, s/p stroke, chronic systolic CHF class 3, and s/p ICD implant. He was on lasix but developed worsening renal insufficiency. He had a BMP today which demonstrates creatinine of 2.0 from 2.1 last week. He has been off of lasix. He has had a stable weights since he was seen last week. No ICD shocks or palpitations or peripheral edema. Allergies  Allergen Reactions  . Codeine   . Pantoprazole Sodium     REACTION: Reaction not known     Current Outpatient Prescriptions  Medication Sig Dispense Refill  . amiodarone (PACERONE) 200 MG tablet Take 200 mg by mouth daily.        Marland Kitchen aspirin 81 MG tablet Take 81 mg by mouth daily.        Marland Kitchen atorvastatin (LIPITOR) 80 MG tablet Take 80 mg by mouth daily.        . diclofenac sodium (VOLTAREN) 1 % GEL Apply 1 application topically as needed.        . enalapril (VASOTEC) 20 MG tablet TAKE 1 TABLET BY MOUTH TWICE A DAY  180 tablet  0  . esomeprazole (NEXIUM) 40 MG capsule Take 1 capsule (40 mg total) by mouth daily before breakfast.  30 capsule  6  . fenofibrate 160 MG tablet Take 1 tablet (160 mg total) by mouth daily.  90 tablet  4  . metoprolol (TOPROL-XL) 50 MG 24 hr tablet Take 1 tablet (50 mg total) by mouth daily.  30 tablet  6  . nitroGLYCERIN (NITROSTAT) 0.4 MG SL tablet Place 1 tablet (0.4 mg total) under the tongue every 5 (five) minutes as needed.  30 tablet  6  . ranitidine (ZANTAC) 150 MG tablet Take 1 tablet (150 mg total) by mouth at bedtime.  90 tablet  2  . dabigatran (PRADAXA) 150 MG CAPS Take 1 capsule (150 mg total) by mouth every 12 (twelve) hours.  180 capsule  1  . hydrALAZINE (APRESOLINE) 25 MG tablet Take 1 tablet (25 mg total) by mouth 2 (two) times daily.  60 tablet  6  . isosorbide mononitrate (IMDUR) 30 MG 24 hr tablet 1/2 tablet twice daily  30 tablet  11     Past Medical History  Diagnosis Date  . Ischemic  cardiomyopathy     EF 35-45% in 2009  . Myocardial infarction     in  1986 with pci  to circumflex  . Atrial fibrillation     on pradaxa for stroke prevention  . Hypertension   . Stroke     in 2009, left fronto-temporal, due to a-fib  . Chronic systolic dysfunction of left ventricle   . GERD (gastroesophageal reflux disease)   . HLD (hyperlipidemia)   . Ventricular tachycardia     prior VT storm treated with amiodarone, followed by Dr. Johney Frame, dual chamber defibrillator  . Obstructive sleep apnea     sees Dr. Stanton Kidney  . DJD (degenerative joint disease)     of shoulder,  . CKD (chronic kidney disease), stage III 02/22/2011    ROS:   All systems reviewed and negative except as noted in the HPI.   Past Surgical History  Procedure Date  . Vocal cord surgery   . Icd implantation   . Hernia repair   . Angioplasty      Family History  Problem Relation Age of Onset  . Heart  disease Father      History   Social History  . Marital Status: Married    Spouse Name: N/A    Number of Children: N/A  . Years of Education: N/A   Occupational History  . Retired    Social History Main Topics  . Smoking status: Former Games developer  . Smokeless tobacco: Never Used  . Alcohol Use: No  . Drug Use: No  . Sexually Active: Not on file   Other Topics Concern  . Not on file   Social History Narrative   ICD-[employer] Remote- YesFinancial Assistance:  Application initiated.  Patient needs to submit further paperwork to complete per Rudell Cobb 02/18/2010.Financial Assistance: approved for 100% discount after Medicare pays for [facility] only, not eligible for [facility] card per Rudell Cobb 04/29/10.     BP 146/86  Pulse 55  Ht 5\' 9"  (1.753 m)  Wt 187 lb (84.823 kg)  BMI 27.62 kg/m2  Physical Exam:  Well appearing NAD HEENT: Unremarkable Neck:  No JVD, no thyromegally Lymphatics:  No adenopathy Back:  No CVA tenderness Lungs:  Clear with no wheezes, rales, or rhonchi. Well  healed ICD implant. HEART:  Regular rate rhythm, no murmurs, no rubs, no clicks Abd:  soft, positive bowel sounds, no organomegally, no rebound, no guarding Ext:  2 plus pulses, no edema, no cyanosis, no clubbing Skin:  No rashes no nodules Neuro:  CN II through XII intact, motor grossly intact  Assess/Plan:

## 2011-03-05 NOTE — Assessment & Plan Note (Signed)
He has had no recurrent tachycardia. Will follow.

## 2011-03-05 NOTE — Assessment & Plan Note (Signed)
His symptoms are stable but still class 3. Additional diuretics are now contra-indicated. He will use diuretics only for increasing weight. I have asked him to start Imdur/hydralazine today and he will be followed closely. The importance of sodium restriction has been discussed.

## 2011-03-05 NOTE — Assessment & Plan Note (Signed)
He is maintaining NSR. He will continue his current meds. 

## 2011-03-06 ENCOUNTER — Telehealth: Payer: Self-pay | Admitting: Internal Medicine

## 2011-03-06 NOTE — Telephone Encounter (Signed)
Spoke with his wife he will decrease his Imdur to 30mg  daily to see if this helps with his HA  Will discuss with Dr [name] on Wed to see if he has any other recommendations

## 2011-03-06 NOTE — Telephone Encounter (Signed)
Pt wants to talk to you about meds hydrazaline or isosorbide. He has a bad headache.  please call about what to take for headachwe He doesn't know which one is causing  this

## 2011-03-07 NOTE — Discharge Summary (Signed)
Physician Discharge Summary  [name] MRN: [id number] DOB/AGE: 1946/01/21 65 y.o.  Admit date: 02/24/2011 Discharge date: 02/25/2011  Admission Diagnoses: Acute on Chronic Renal Failure  Discharge Diagnoses:  1. Acute on chronic renal failure.   2. Congestive heart failure.   3. Coronary artery disease with MI in 1986  4. Atrial fibrillation with past history of stroke in 2009  5. Gastroesophageal reflux disease.   6. Hypertension.   7. Dyslipidemia.   8. Cardiomyopathy with episodes of ventricular tachycardia and defibrillator placed.   9.Obstructive sleep apnea.   10.Degenerative joint disease.   11. Vertigo.    Discharged Condition: stable and improved.  He has had no shortness of breath or chest pain during his stay.  His kidney  function has improved going from a creatinine of 2.96 on admission to 2.1 on discharge, approaching his baseline of 1.7-1.9.  Hospital Course:  1. Acute on chronic renal failure.  After admission, Mr. Orlick hydrated with 100 mL/hour normal saline for 8 hours for 800 total mL.  Following day, his creatinine improved to 2.09, which was approaching his baseline of 1.7 to 1.9. The patient reported good urine output and did not appear to be retaining fluid.  On clinical exam, lungs clear to auscultation and no peripheral edema.  While in hospital, his enalapril was held in order to improve renal function.  At discharge, Mr. Castellana felt ready to go, did not have shortness of breath, and was having good urine output.   Consults: None  Significant Diagnostic Studies:  Mr. Mines had an EKG, which showed no acute changes and was consistent with his prior baseline EKG from December 2011.  EKG did show sinus bradycardia and some left bundle-branch block.   Treatments: 1. Acute on chronic renal failure: IVF to improve kidney perfusion, along with holding Enalapril while hospitalized.  Discharge Exam: Temperature 97.4, pulse 55, respirations  22, systolic blood pressure 123, diastolic blood pressure 71, oxygen saturation 100% on room air.   Disposition: Mr. Desha will have followup appointment scheduled for Oct 11 with his primary care provider in order to follow up in hospital visit.  Of note, we would like to have a creatinine level checked to ensure that his creatinine has continued to down.  We would also like the primary care provider to review Mr. Buckner medications.  He has been taken off enalapril for a few days to help improve his kidney function and lower his creatinine; however, he should be on that medication again long term and so following up with his primary care provider is important in order to put him back on enalapril.  Additionally, we have prescribed to Mr. Parada some Lasix to be used as needed for any future episodes of swelling only 20 mg and have instructed Mr. Viveros to record daily weights and be attentive to his body weight and any swelling and shortness of breath, but he may hope to manage his own fluid levels more effectively and we encouraged PCP to follow up with this instruction and see how Mr. Sexson is adhering and how his weights have been.   Discharge Orders    Future Appointments: Provider: Department: Dept Phone: Center:   03/22/2011 11:15 AM Gardiner Rhyme, MD [facility] [phone] [id number]     Cannot display discharge medications since this is not an admission.    Signed: Kweku Stankey 03/07/2011, 10:49 AM  Note was left in Dr Jory Ee inbasket after her last day. I am signing  the note to complete the process but was not involved in the pt's care.

## 2011-03-14 NOTE — Telephone Encounter (Signed)
Spoke with wife and he is not having any more HA after the decrease in his Imdur  She will call me back if needed

## 2011-03-20 ENCOUNTER — Other Ambulatory Visit: Payer: Self-pay | Admitting: Internal Medicine

## 2011-03-20 NOTE — Discharge Summary (Signed)
NAMESHANKAR, SILBER NO.:  [id number]  MEDICAL RECORD NO.:  [id number]  LOCATION:  4741                         FACILITY:  [facility]  PHYSICIAN:  Cliffton Asters, M.D.    DATE OF BIRTH:  [birth year]  DATE OF ADMISSION:  02/24/2011 DATE OF DISCHARGE:  02/25/2011                              DISCHARGE SUMMARY   DISCHARGE DIAGNOSES: 1. Acute on chronic renal failure. 2..Chronic kidney disease stage III 3. Congestive heart failure/ Cardiomyopathy.  a. EF 20-25%, diffuse hypokinesis in Oct 2012  b. Defibrillator placed September 2006 4. Coronary artery disease.  a. Myocardial infarction in 1986 5. Atrial fibrillation. 6. History of stroke in 2009 7. Gastroesophageal reflux disease. 8. Hypertension. 9. Dyslipidemia. 10.Obstructive sleep apnea 11.Degenerative joint disease 12.Vertigo   DISCHARGE MEDICATIONS:  New Medications: 1. Lasix take 1 pill 20 mg by mouth as needed, take 1 pill if you     notice increased weight gain 5 pounds or more, swelling in your     lower leg and shortness of breath.  If you continue to experience     symptoms, please call the clinic and seek medical care. 2. Amiodarone 200 mg 1 tablet by mouth daily at bedtime. 3. Aspirin 81 mg by mouth daily. 4. Fenofibrate 160 mg by mouth every evening. 5. Hydroxyzine 25 mg 1 tablet by mouth daily at bedtime. 6. Lipitor 80 mg 1 tablet by mouth daily at bedtime. 7. Metoprolol XL succinate 50 mg 1 tablet by mouth every evening. 8. Nexium 40 mg 1 capsule by mouth daily. 9. Nitroglycerin SL 0.4 mg for chest pain 1 tablet under the tongue     every 5 minutes as needed up to 3 doses for chest pain. 10.Pradaxa 150 mg 1 capsule by mouth twice daily. 11.Ranitidine 150 mg 1 tablet by mouth daily at bedtime. 12.Voltaren 1% gel for left shoulder pain, apply topically twice daily     as needed.  DISPOSITION AND FOLLOWUP:  Mr. Orner was discharged from [facility] on February 25, 2011, in stable and  improved condition.  He has had no shortness of breath or chest pain during his stay.  His kidney function has improved, with creatinine levels going from 2.96 on admission to 2.1 on discharge. He will have followup appointment scheduled for Monday or Tuesday with his primary care provider in order to follow up in hospital visit.  Of note, we would like to have a creatinine level checked to ensure that his creatinine has continued to down.  We would also like the primary care provider to review Mr. Failla medications.  He has been taken off enalapril for a few days to help improve his kidney function and lower his creatinine; however, he should be on that medication again long term and so following up with his primary care provider is important in order to put him back on enalapril.  Additionally, we have prescribed to Mr. Melman 20mg  PO Lasix to be used as needed for any future episodes of swelling and have instructed Mr. Ayyad to record daily weights and be attentive to his body weight and any swelling and shortness of breath, so that he may manage his own fluid levels more effectively and we  encouraged PCP to follow up with this instruction and see how Mr. Suppes is adhering and how his weights have been.  PROCEDURES PERFORMED:  Mr. Peacock had an EKG, which showed no acute changes and was consistent with his prior baseline EKG from December 2011.  EKG did show sinus bradycardia and left bundle-branch block.  CONSULTATIONS:  None.  BRIEF ADMITTING HISTORY AND PHYSICAL:  Mr. Stelle is a 65 year old African American male with history of chronic kidney disease stage III, CHF, CAD, stroke, hypertension, and atrial fibrillation who presented with a rise in creatinine value increasing from 1.8 on February 20, 2011, to 2.88 on February 22, 2011, and 2.96 on February 24, 2011.  Baseline creatinine level ranges from about 1.7 to 1.9.  On Mr. Parkerson visit with his cardiologist on Monday,  February 20, 2011, for increasing shortness of breath, the cardiologist felt that Mr. Diamant had some fluid on his lungs and prescribed him 40 mg Lasix  b.i.d., although a chest x-ray was done and found no pulmonary edema. Two days later at an outpatient Internal Medicine appointment Mr. Ancrum creatinine was found to be 2.88 and his Lasix was discontinued as it was believed to be the cause of Mr. Renfrew increased creatinine.  Two days later on Friday, February 24, 2011, Mr. Offerdahl was found to have a continued elevation of his creatinine slightly higher at 2.96 and concern for kidney failure versus reason for admission.  In addition to this history, Mr. Durr and his wife relates that Mr. Que shortness of breath has been worsening recently, he becomes tired after climbing about 13 steps, which requires him to lie down and rest.  He can walk about 1-2 blocks before becoming short of breath.  He denies any chest pain currently, although he has had some intermittently in the past 2 weeks that was relieved by nitroglycerin most recently on Friday, February 17, 2011.  He describes the pain as dull pain.  He does also acknowledge recent dietary indiscretion including increased salt intake.  States that he sleeps on 2 flat pillows.  Denies waking up with shortness of breath at night. Denies any recent swelling in his legs or in his body.  He did experience some increased urinary frequency with his Lasix; however, since we stopped Lasix, he has returned to baseline urination.  Denies any burning with urination or hematuria.  PHYSICAL EXAMINATION:  VITAL SIGNS:  On admission, temperature 97.2, pulse 60, respiratory rate 20, blood pressure 142/75, oxygen saturation 98% on room air, weight 83.2 kg. GENERAL APPEARANCE:  Alert, cooperative, no distress.  Appears stated age, average built. HEENT:  Head normocephalic without obvious abnormality.  Eyes, pupils equal, reactive to light.   Sclerae clear.  Extraocular movements intact. Nose, septal midline, mucosa normal, normal drainage.  Lip, mucosa, and tongue normal and moist.  Gums normal.  No teeth.  Uses dentures. NECK:  Supple.  Trachea midline.  No enlargement.  No jugular venous distention. BACK:  No CVA tenderness. LUNGS:  Clear to auscultation bilaterally with very slihglty basilar crackles.  Respirations unlabored.  Chest wall, no tenderness or deformity. HEART:  Regular rate and rhythm.  S1 and S2 normal.  No murmurs, rubs, or gallops. ABDOMEN:  Soft, nontender.  Normoactive.  No masses or organomegaly. EXTREMITIES:  Normal, atraumatic.  No cyanosis or edema. SKIN:  No lesions or rashes. NEUROLOGIC:  Cranial nerves II through XII grossly intact.  Strength 5/5 in upper and lower extremities.  Negative Romberg.  LABORATORY DATA:  Admission basic metabolic panel; sodium 142, potassium 3.9, chloride 103, CO2 of 33, glucose 87, BUN 26, creatinine 2.96, calcium 9.9.  HOSPITAL COURSE BY PROBLEM: 1. Acute on chronic renal failure.  After admission, Mr. Uzzle     was  gently hydrated with some IVF,  his lasix and enalapril were held.     Following day, his creatinine improved to 2.09, which was approaching      his baseline of 1.7 to 1.9. He had good urine output and did not appear to be     retaining fluid.  On clinical exam, lungs clear to auscultation and     no peripheral edema.   At discharge, Mr. Innocent      did not have shortness of breath or chest pain. 2. Congestive heart failure.  Mr. Cabello did not appear to be acutely     decompensated.  On admission, he was stable.  During his hospital     stay, his enalapril was held in order to improve renal function.     He will follow up with his primary care provider on Monday or     Tuesday in order to return when to restart enalapril. 3. Coronary artery disease, stable. Ruled out for ACS with  negative enzymes.     Aspirin, metoprolol , statin were  continued and nitroglycerin was made available; however,       not needed. 4. Atrial fibrillation.  Mr. Cheatum was in sinus rhythm during     hospital course.  He was continued on his outpatient Pradaxa and     amiodarone, stable. 5. GERD stable.  Continued on Nexium. 6. Hypertension.  Mr. Quevedo was continued on his metoprolol XL 50 mg     daily.  Enalapril was held, as mentioned stable. 7. Dyslipidemia.  During hospital course, Mr. Peace was continued on     Crestor and will be discharged on his outpatient Lipitor dose,     stable.  DISCHARGE LABS:  Pro-BNP 316.2.  TSH 0.977.  Urine creatinine 217.5, urine sodium 156.  Urinalysis, color appearance yellow clear, pH 7.0, urine glucose negative, bilirubin negative, ketones negative, blood negative, protein negative, nitrite negative, leukocytes negative, squamous cells per low power field rare.  Cardiac enzyme panel, creatinine kinase 293, CK-MB 3.8, relative index 1.3, troponin 0.3. CBC; white blood cell count 6, hemoglobin 13.7, hematocrit 40.2, platelet count 260.  Comprehensive metabolic panel; sodium 140, potassium 3.9, chloride 104, CO2 of 28, glucose 86, BUN 20, creatinine 2.09, bilirubin 0.6, alk phos 45, AST 30, ALT 29, total protein 6.9, albumin 3.8, calcium 9.3.  DISCHARGE VITALS:  Temperature 97.4, pulse 55, respirations 22, systolic blood pressure 123, diastolic blood pressure 71, oxygen saturation 100% on room air.    ______________________________ Elyse Jarvis, MD   ______________________________ Cliffton Asters, M.D.    MS/MEDQ  D:  02/25/2011  T:  02/25/2011  Job:  [id number]  cc:   Bethel Born, MD  Electronically Signed by Elyse Jarvis MD on 03/02/2011 03:49:32 PM Electronically Signed by Cliffton Asters M.D. on 03/20/2011 03:17:13 PM

## 2011-03-22 ENCOUNTER — Encounter: Payer: Self-pay | Admitting: Internal Medicine

## 2011-03-22 ENCOUNTER — Encounter: Payer: Medicare Other | Admitting: Internal Medicine

## 2011-03-28 ENCOUNTER — Encounter: Payer: Medicare Other | Admitting: Internal Medicine

## 2011-04-11 ENCOUNTER — Telehealth: Payer: Self-pay | Admitting: Internal Medicine

## 2011-04-11 NOTE — Telephone Encounter (Signed)
New problem Pt has questions regarding pradaxa and his kidney problems

## 2011-04-11 NOTE — Telephone Encounter (Signed)
Just questioning the Pradaxa and his kidney function  Wants me to check with Dr Johney Frame and make sure he is still okay that he stays on the medication

## 2011-04-11 NOTE — Telephone Encounter (Signed)
She has a question

## 2011-04-11 NOTE — Telephone Encounter (Signed)
F/U wife has returned your call.  Please call back

## 2011-04-11 NOTE — Telephone Encounter (Signed)
lmom for her to return my call

## 2011-04-17 ENCOUNTER — Other Ambulatory Visit: Payer: Self-pay | Admitting: Internal Medicine

## 2011-04-17 NOTE — Telephone Encounter (Signed)
Her concerns are valid. Based on his last BMET 03/02/11, his CrCl is 45.  He should therefore be on pradaxa 150mg  BID. We will continue to follow his creatinine and adjust dosing if CrCL<30 in the future.

## 2011-04-18 NOTE — Telephone Encounter (Signed)
Spoke with wife and she is aware

## 2011-04-20 ENCOUNTER — Encounter: Payer: Self-pay | Admitting: Internal Medicine

## 2011-04-20 ENCOUNTER — Telehealth: Payer: Self-pay | Admitting: Internal Medicine

## 2011-04-20 ENCOUNTER — Ambulatory Visit (INDEPENDENT_AMBULATORY_CARE_PROVIDER_SITE_OTHER): Payer: Medicare Other | Admitting: Internal Medicine

## 2011-04-20 VITALS — BP 128/76 | HR 60 | Ht 69.0 in | Wt 191.0 lb

## 2011-04-20 DIAGNOSIS — I472 Ventricular tachycardia: Secondary | ICD-10-CM

## 2011-04-20 DIAGNOSIS — I4729 Other ventricular tachycardia: Secondary | ICD-10-CM

## 2011-04-20 DIAGNOSIS — I4891 Unspecified atrial fibrillation: Secondary | ICD-10-CM

## 2011-04-20 DIAGNOSIS — N183 Chronic kidney disease, stage 3 unspecified: Secondary | ICD-10-CM

## 2011-04-20 DIAGNOSIS — I5023 Acute on chronic systolic (congestive) heart failure: Secondary | ICD-10-CM

## 2011-04-20 LAB — ICD DEVICE OBSERVATION
AL AMPLITUDE: 3.5 mv
BAMS-0003: 70 {beats}/min
CHARGE TIME: 8.8 s
DEVICE MODEL ICD: 141895
HV IMPEDENCE: 47 Ohm
RV LEAD IMPEDENCE ICD: 453 Ohm
RV LEAD THRESHOLD: 1.2 V
TZAT-0001FASTVT: 1
TZAT-0001FASTVT: 2
TZAT-0002FASTVT: NEGATIVE
TZAT-0004FASTVT: 8
TZAT-0013FASTVT: 2
TZAT-0018FASTVT: NEGATIVE
TZAT-0019FASTVT: 5 V
TZAT-0020FASTVT: 1 ms
TZON-0004FASTVT: 2.5
TZON-0005FASTVT: 1
TZST-0001FASTVT: 3
TZST-0001FASTVT: 4
TZST-0001FASTVT: 6
TZST-0001FASTVT: 8
TZST-0003FASTVT: 31 J
TZST-0003FASTVT: 41 J
TZST-0003FASTVT: 41 J

## 2011-04-20 LAB — HEPATIC FUNCTION PANEL
ALT: 39 U/L (ref 0–53)
AST: 38 U/L — ABNORMAL HIGH (ref 0–37)
Albumin: 4.3 g/dL (ref 3.5–5.2)
Alkaline Phosphatase: 57 U/L (ref 39–117)
Total Protein: 7.2 g/dL (ref 6.0–8.3)

## 2011-04-20 LAB — CBC WITH DIFFERENTIAL/PLATELET
Basophils Relative: 0.3 % (ref 0.0–3.0)
Eosinophils Relative: 2 % (ref 0.0–5.0)
Hemoglobin: 13.5 g/dL (ref 13.0–17.0)
Lymphocytes Relative: 30.7 % (ref 12.0–46.0)
MCHC: 33.8 g/dL (ref 30.0–36.0)
Monocytes Relative: 9.8 % (ref 3.0–12.0)
Neutro Abs: 4.2 10*3/uL (ref 1.4–7.7)
RBC: 4.27 Mil/uL (ref 4.22–5.81)
WBC: 7.4 10*3/uL (ref 4.5–10.5)

## 2011-04-20 LAB — BASIC METABOLIC PANEL
CO2: 28 mEq/L (ref 19–32)
Calcium: 9.5 mg/dL (ref 8.4–10.5)
GFR: 52.69 mL/min — ABNORMAL LOW (ref >60.00)
Sodium: 142 mEq/L (ref 135–145)

## 2011-04-20 LAB — T4, FREE: Free T4: 0.9 ng/dL (ref 0.60–1.60)

## 2011-04-20 NOTE — Assessment & Plan Note (Signed)
Given his renal function, we are limited in our ability to use diuretics.  We will consult nephrology 2 gram sodium diet.

## 2011-04-20 NOTE — Progress Notes (Signed)
PCP:  Bethel Born, MD  The patient presents today for routine electrophysiology followup.  Since last being seen in our clinic, the patient reports doing reasonably well.  His SOB is stable.  Today, he denies symptoms of palpitations, chest pain, orthopnea, PND, dizziness, presyncope, syncope, or neurologic sequela.  He has stable edema.  He denies bleeding.  The patient feels that he is tolerating medications without difficulties and is otherwise without complaint today.   Past Medical History  Diagnosis Date  . Ischemic cardiomyopathy     EF 35-45% in 2009  . Myocardial infarction     in  1986 with pci  to circumflex  . Atrial fibrillation     on pradaxa for stroke prevention  . Hypertension   . Stroke     in 2009, left fronto-temporal, due to a-fib  . Chronic systolic dysfunction of left ventricle   . GERD (gastroesophageal reflux disease)   . HLD (hyperlipidemia)   . Ventricular tachycardia     prior VT storm treated with amiodarone, followed by Dr. Johney Frame, dual chamber defibrillator  . Obstructive sleep apnea     sees Dr. Stanton Kidney  . DJD (degenerative joint disease)     of shoulder,  . CKD (chronic kidney disease), stage III 02/22/2011   Past Surgical History  Procedure Date  . Vocal cord surgery   . Icd implantation   . Hernia repair   . Angioplasty     Current Outpatient Prescriptions  Medication Sig Dispense Refill  . amiodarone (PACERONE) 200 MG tablet Take 200 mg by mouth daily.        Marland Kitchen aspirin 81 MG tablet Take 81 mg by mouth daily.        Marland Kitchen atorvastatin (LIPITOR) 80 MG tablet Take 80 mg by mouth daily.        . dabigatran (PRADAXA) 150 MG CAPS Take 1 capsule (150 mg total) by mouth every 12 (twelve) hours.  180 capsule  1  . diclofenac sodium (VOLTAREN) 1 % GEL Apply 1 application topically as needed.        . enalapril (VASOTEC) 20 MG tablet TAKE 1 TABLET BY MOUTH TWICE A DAY  180 tablet  0  . esomeprazole (NEXIUM) 40 MG capsule Take 1 capsule (40 mg  total) by mouth daily before breakfast.  30 capsule  6  . fenofibrate 160 MG tablet Take 1 tablet (160 mg total) by mouth daily.  90 tablet  4  . hydrALAZINE (APRESOLINE) 25 MG tablet Take 1 tablet (25 mg total) by mouth 2 (two) times daily.  60 tablet  6  . hydrOXYzine (ATARAX/VISTARIL) 50 MG tablet TAKE 1 TABLET BY MOUTH THREE TIMES DAILY AS NEEDED  30 tablet  0  . isosorbide mononitrate (IMDUR) 30 MG 24 hr tablet 30 mg daily.        . metoprolol (TOPROL-XL) 50 MG 24 hr tablet Take 1 tablet (50 mg total) by mouth daily.  30 tablet  6  . nitroGLYCERIN (NITROSTAT) 0.4 MG SL tablet Place 1 tablet (0.4 mg total) under the tongue every 5 (five) minutes as needed.  30 tablet  6  . ranitidine (ZANTAC) 150 MG tablet Take 1 tablet (150 mg total) by mouth at bedtime.  90 tablet  2    Allergies  Allergen Reactions  . Codeine   . Pantoprazole Sodium     REACTION: Reaction not known    History   Social History  . Marital Status: Married    Spouse Name:  N/A    Number of Children: N/A  . Years of Education: N/A   Occupational History  . Retired    Social History Main Topics  . Smoking status: Former Games developer  . Smokeless tobacco: Never Used  . Alcohol Use: No  . Drug Use: No  . Sexually Active: Not on file   Other Topics Concern  . Not on file   Social History Narrative   ICD-[employer] Remote- YesFinancial Assistance:  Application initiated.  Patient needs to submit further paperwork to complete per Rudell Cobb 02/18/2010.Financial Assistance: approved for 100% discount after Medicare pays for [facility] only, not eligible for [facility] card per Rudell Cobb 04/29/10.    Family History  Problem Relation Age of Onset  . Heart disease Father     ROS-  All systems are reviewed and are negative except as outlined in the HPI above   Physical Exam: Filed Vitals:   04/20/11 1116  BP: 128/76  Pulse: 60  Height: 5\' 9"  (1.753 m)  Weight: 191 lb (86.637 kg)    GEN- The patient is well  appearing, alert and oriented x 3 today.   Head- normocephalic, atraumatic Eyes-  Sclera clear, conjunctiva pink Ears- hearing intact Oropharynx- clear Neck- supple, no JVP Lymph- no cervical lymphadenopathy Lungs- Clear to ausculation bilaterally, normal work of breathing Chest- ICD pocket is well healed Heart- Regular rate and rhythm, no murmurs, rubs or gallops, PMI not laterally displaced GI- soft, NT, ND, + BS Extremities- no clubbing, cyanosis, or edema  ICD interrogation- reviewed in detail today,  See PACEART report  Assessment and Plan:

## 2011-04-20 NOTE — Assessment & Plan Note (Signed)
He has not been seen by a nephrologist. I am concerned about his long term renal function and will therefore refer him to Dr Lowell Guitar (nephrology) at this time.

## 2011-04-20 NOTE — Patient Instructions (Addendum)
Your physician recommends that you schedule a follow-up appointment in: 6 weeks [name] and 3 months with Dr. Johney Frame.  You have been referred to Dr.Powell--nephrology  Your physician recommends that you return for lab work today

## 2011-04-20 NOTE — Assessment & Plan Note (Signed)
3 episodes of VT upon ICD interrogation today.  All three episodes occurred in October and were between 190 and 199 bpm; 2 of three were terminated with ATP.  Once episode decreased to 162 bpm (below detection rate) after ATP and appears to have been prolonged.  Today, I have reduced VT detection to 160 bpm.  I have also increase ATP to 2 spins of burst followed by 2 spins of ramp/scan.  VT and VF detection durations were also lengthened.    ICD function is otherwise normal.  No medication changes today.

## 2011-04-20 NOTE — Assessment & Plan Note (Signed)
Maintaining sinus with amiodarone Given labile creatinine recently, we may have to either reduce pradaxa to 75mg  BID or consider coumadin. Presently, he would prefer to stay on pradaxa though he understands the risks of bleeding. I will recheck CrCl today and then make a decision. Certainly, should his creatinine clearance decrease below 15, we will have to stop this medicine.  For CrCl 15-30, we will need to decrease to 75mg  BID.

## 2011-04-20 NOTE — Telephone Encounter (Signed)
New message:  Pt called and stated the power company would be faxing a paper to be completed so his power would be restored.  Info only

## 2011-04-21 ENCOUNTER — Telehealth: Payer: Self-pay | Admitting: Internal Medicine

## 2011-04-21 NOTE — Telephone Encounter (Signed)
Fu call °Pt returning your call  °

## 2011-04-21 NOTE — Telephone Encounter (Signed)
Lab results given

## 2011-04-26 ENCOUNTER — Ambulatory Visit (INDEPENDENT_AMBULATORY_CARE_PROVIDER_SITE_OTHER): Payer: Medicare Other | Admitting: Family Medicine

## 2011-04-26 ENCOUNTER — Encounter: Payer: Self-pay | Admitting: Family Medicine

## 2011-04-26 VITALS — BP 164/81 | HR 55 | Temp 98.1°F | Ht 69.0 in | Wt 183.0 lb

## 2011-04-26 DIAGNOSIS — M25519 Pain in unspecified shoulder: Secondary | ICD-10-CM

## 2011-04-26 NOTE — Patient Instructions (Addendum)
You have rotator cuff impingement (subacromial bursitis, rotator cuff tendinitis - all treated the same). Try to avoid painful activities (overhead activities, lifting with extended arm) as much as possible. Tylenol or Vicodin as needed for pain - if you need something long term for pain, discuss this with your primary care doctor. Subacromial injection may be beneficial to help with pain and to decrease inflammation - you were given this today. Home exercise program as discussed with theraband and scapular stabilization exercises - these are very important for long term relief even if an injection was given - 3 sets of 10 once a day.  Take frequent breaks, do these while seated. Follow up with me in 6 weeks - if not improving we will do an ultrasound here in the office.  These are my general arthritis recommendations: Take tylenol 500mg  1-2 tabs three times a day for pain. Aleve 1-2 tabs twice a day with food Glucosamine sulfate 750mg  twice a day is a supplement that has been shown to help moderate to severe arthritis. Capsaicin topically up to four times a day may also help with pain. Cortisone injections are an option. If cortisone injections do not help, there are different types of shots that may help but they take longer to take effect. It's important that you continue to stay active. If you are overweight, try to lose weight through diet and exercise. Consider physical therapy to strengthen muscles around the joint that hurts to take pressure off of the joint itself. Shoe inserts with good arch support may be helpful. Walker or cane if needed. Heat or ice 15 minutes at a time 3-4 times a day as needed to help with pain. Water aerobics and cycling with low resistance are the best two types of exercise for arthritis. A knee brace may be helpful if your knee feels unstable due to pain.

## 2011-04-27 ENCOUNTER — Encounter: Payer: Self-pay | Admitting: Family Medicine

## 2011-04-27 NOTE — Progress Notes (Signed)
Subjective:    [name], male    DOB: 04/09/1946, 65 y.o.   MRN: [id number]  PCP: [facility]  HPI 65 yo M here for left shoulder pain.  Denies known shoulder injury. States pain started around 2 years ago - around time he had pacemaker placed though not sure if this was the cause (felt not to be). Has been diagnosed with rotator cuff syndrome in past - given two different cortisone injections which helped with his pain for several months. Was enrolled in PT for this which he had to stop because he passed out in one session 2/2 his severe CHF. Has had shoulder x-rays that showed minimal DJD of glenohumeral, AC joints. Pain worse with reaching, overhead activities. Is right handed. At times is so bad he can't lift his arm. Hydrocodone has helped in past.  Voltaren gel has not. No night pain.  Past Medical History  Diagnosis Date  . Ischemic cardiomyopathy     EF 35-45% in 2009  . Myocardial infarction     in  1986 with pci  to circumflex  . Atrial fibrillation     on pradaxa for stroke prevention  . Hypertension   . Stroke     in 2009, left fronto-temporal, due to a-fib  . Chronic systolic dysfunction of left ventricle   . GERD (gastroesophageal reflux disease)   . HLD (hyperlipidemia)   . Ventricular tachycardia     prior VT storm treated with amiodarone, followed by Dr. Johney Frame, dual chamber defibrillator  . Obstructive sleep apnea     sees Dr. Stanton Kidney  . DJD (degenerative joint disease)     of shoulder,  . CKD (chronic kidney disease), stage III 02/22/2011    Current Outpatient Prescriptions on File Prior to Visit  Medication Sig Dispense Refill  . amiodarone (PACERONE) 200 MG tablet Take 200 mg by mouth daily.        Marland Kitchen aspirin 81 MG tablet Take 81 mg by mouth daily.        Marland Kitchen atorvastatin (LIPITOR) 80 MG tablet Take 80 mg by mouth daily.        . dabigatran (PRADAXA) 150 MG CAPS Take 1 capsule (150 mg total) by mouth every 12 (twelve) hours.  180  capsule  1  . diclofenac sodium (VOLTAREN) 1 % GEL Apply 1 application topically as needed.        . enalapril (VASOTEC) 20 MG tablet TAKE 1 TABLET BY MOUTH TWICE A DAY  180 tablet  0  . esomeprazole (NEXIUM) 40 MG capsule Take 1 capsule (40 mg total) by mouth daily before breakfast.  30 capsule  6  . fenofibrate 160 MG tablet Take 1 tablet (160 mg total) by mouth daily.  90 tablet  4  . hydrALAZINE (APRESOLINE) 25 MG tablet Take 1 tablet (25 mg total) by mouth 2 (two) times daily.  60 tablet  6  . hydrOXYzine (ATARAX/VISTARIL) 50 MG tablet TAKE 1 TABLET BY MOUTH THREE TIMES DAILY AS NEEDED  30 tablet  0  . isosorbide mononitrate (IMDUR) 30 MG 24 hr tablet 30 mg daily.        . metoprolol (TOPROL-XL) 50 MG 24 hr tablet Take 1 tablet (50 mg total) by mouth daily.  30 tablet  6  . nitroGLYCERIN (NITROSTAT) 0.4 MG SL tablet Place 1 tablet (0.4 mg total) under the tongue every 5 (five) minutes as needed.  30 tablet  6  . ranitidine (ZANTAC) 150 MG tablet Take  1 tablet (150 mg total) by mouth at bedtime.  90 tablet  2    Past Surgical History  Procedure Date  . Vocal cord surgery   . Icd implantation   . Hernia repair   . Angioplasty     Allergies  Allergen Reactions  . Codeine   . Pantoprazole Sodium     REACTION: Reaction not known    History   Social History  . Marital Status: Married    Spouse Name: N/A    Number of Children: N/A  . Years of Education: N/A   Occupational History  . Retired    Social History Main Topics  . Smoking status: Former Games developer  . Smokeless tobacco: Never Used  . Alcohol Use: No  . Drug Use: No  . Sexually Active: Not on file   Other Topics Concern  . Not on file   Social History Narrative   ICD-[employer] Remote- YesFinancial Assistance:  Application initiated.  Patient needs to submit further paperwork to complete per Rudell Cobb 02/18/2010.Financial Assistance: approved for 100% discount after Medicare pays for [facility] only, not eligible  for [facility] card per Rudell Cobb 04/29/10.    Family History  Problem Relation Age of Onset  . Heart disease Father   . Hyperlipidemia Father   . Hypertension Father   . Heart attack Father   . Diabetes Sister   . Sudden death Neg Hx     BP 164/81  Pulse 55  Temp(Src) 98.1 F (36.7 C) (Oral)  Ht 5\' 9"  (1.753 m)  Wt 183 lb (83.008 kg)  BMI 27.02 kg/m2  Review of Systems See HPI.    Objective:   Physical Exam Gen: NAD L shoulder: No swelling, ecchymoses.  No gross deformity.  Incision noted over left chest from pacemaker placement. Mild TTP AC and biceps tendon. FROM with painful arc.  No adhesive capsulitis. Positive Hawkins, Neers. Negative Speeds, Yergasons. Negative crossover adduction. Mild pain with empty can.  No pain with resisted internal/external rotation - 5/5 strength with all these. Negative apprehension. NV intact distally.  R shoulder: FROM without pain, weakness.    Assessment & Plan:  1. Left shoulder pain - 2/2 rotator cuff syndrome, impingement.  Shown home exercise program with theraband, scapular stabilization exercises also.  To do these daily but take frequent breaks, do them slowly so he does not get short of breath or pass out.  Subacromial injection done today as well.  Icing, avoid painful activities.  Can consider ultrasound if not improving.  Even if he did not improve and had a small partial thickness rotator cuff tear (has excellent strength so suspect it would be small), would likely be more risky to pursue surgical intervention given his cardiovascular status.  He may be a better candidate for intermittent subacromial injections + pain management.  Should he pursue pain management, advised to consult with his PCP - if PCP uncomfortable with this, they should refer him to pain management.  After informed written consent, patient was seated on exam table. Left shoulder was prepped with alcohol swab and utilizing posterior approach, patient's left  subacromial space was injected with 3:1 marcaine: depomedrol. Patient tolerated the procedure well without immediate complications.

## 2011-04-27 NOTE — Assessment & Plan Note (Addendum)
2/2 rotator cuff syndrome, impingement. Shown home exercise program with theraband, scapular stabilization exercises also. To do these daily but take frequent breaks, do them slowly so he does not get short of breath or pass out. Subacromial injection done today as well. Icing, avoid painful activities. Can consider ultrasound if not improving. Even if he did not improve and had a small partial thickness rotator cuff tear (has excellent strength so suspect it would be small), would likely be more risky to pursue surgical intervention given his cardiovascular status. He may be a better candidate for intermittent subacromial injections + pain management. Should he pursue pain management, advised to consult with his PCP - if PCP uncomfortable with this, they should refer him to pain management.   After informed written consent, patient was seated on exam table. Left shoulder was prepped with alcohol swab and utilizing posterior approach, patient's left subacromial space was injected with 3:1 marcaine: depomedrol. Patient tolerated the procedure well without immediate complications.

## 2011-05-11 ENCOUNTER — Telehealth: Payer: Self-pay | Admitting: Internal Medicine

## 2011-05-11 NOTE — Telephone Encounter (Signed)
New Problem:   Patient's wife called and said that he has had a persistent headache and fatigue for the past three days and was wondering if it had something to do with his medication.

## 2011-05-11 NOTE — Telephone Encounter (Signed)
Called patients wife back and left her a message to call me back in regards to her message

## 2011-05-12 NOTE — Telephone Encounter (Signed)
Follow up from previous call:  Returning call back to nurse Tresa Endo.

## 2011-05-12 NOTE — Telephone Encounter (Signed)
Spoke with wife and he was doing to much.  His head has not hurt today and she will call things get worse or needs to bring him in

## 2011-05-15 ENCOUNTER — Telehealth: Payer: Self-pay | Admitting: Internal Medicine

## 2011-05-15 NOTE — Telephone Encounter (Signed)
Pt has found his own kidney provider in [place] because they will be moving back to [place] and patient needs [place] to do a referral to that new kidney provider. [facility]  [address] , [place], [place] [address] Phone# [phone] any provider per pt spouse call company they are excepting new patients so just call and set him up with first avail.

## 2011-05-17 ENCOUNTER — Other Ambulatory Visit: Payer: Self-pay | Admitting: Internal Medicine

## 2011-05-17 NOTE — Telephone Encounter (Signed)
[name] to get all the information to this MD

## 2011-05-29 ENCOUNTER — Telehealth: Payer: Self-pay | Admitting: Internal Medicine

## 2011-05-29 NOTE — Telephone Encounter (Addendum)
Pt Came in Friday 05/26/11..signed ROI to have all Records transferred to  1. [facility] @ [phone] 2.Dr.Ade Loretta Plume @ [phone] Both Fax #'s I have Will not Work, Called [facility] @  [phone], LMOVM 05/29/11/KM.  Called [facility] Got another Fax # for Dr.Gabris, Records faxed to [phone] ( Did not go through) 05/29/2011/KM  Called and Verified Fax # for [facility] [employer] fax I have ( [phone]) this was  The Wrong  Fax # refaxed records to [phone]  they went through to this # 05/30/11  Records Were faxed to [facility], Wife called and stated the fax Number's we were given their fax Machines were not working, refaxed all Records to dr.Gabis @ [phone] & [facility] @ [phone] 05/30/11/KM    These Numbers [phone] & [phone] did not work neither, called back spoke with someone in Dr.Gabis's Office she asked me to fax records to [phone] which is area he will be in Tomorrow seeing Pt's .Records were faxed 05/30/11/KM  I refaxed Records to [facility] @ [phone] & [phone] Get records to go through,will try a Different Fax Machine 05/31/11/KM  Called Dr.Gabis Office Spoke with Clydie Braun and asked he for a different Fax # because the [place] i have are not working she gave me [phone],all Cardiac records were faxed to this # it Went through 05/31/11/KM

## 2011-05-29 NOTE — Telephone Encounter (Signed)
Pt's wife rtn call to [facility]

## 2011-05-29 NOTE — Telephone Encounter (Signed)
All information was given to [place] prior to Christmas  Spoke with wife she is aware because Merita Norton has spoken with them.  I will forward to her to follow up on.  Patient's wife aware she Merita Norton in not here to day

## 2011-05-29 NOTE — Telephone Encounter (Signed)
New Msg: Pt wife calling wanting to discuss with nurse about pt kidney referral in [place]. Please return pt wife call to discuss further.

## 2011-05-31 ENCOUNTER — Ambulatory Visit: Payer: Medicare Other | Admitting: Physician Assistant

## 2011-06-07 ENCOUNTER — Ambulatory Visit: Payer: Medicare Other | Admitting: Family Medicine

## 2011-06-26 ENCOUNTER — Other Ambulatory Visit: Payer: Self-pay

## 2011-06-26 DIAGNOSIS — I428 Other cardiomyopathies: Secondary | ICD-10-CM

## 2011-06-26 MED ORDER — ATORVASTATIN CALCIUM 80 MG PO TABS: 80.0000 mg | ORAL_TABLET | Freq: Every day | ORAL | Status: DC

## 2011-06-28 ENCOUNTER — Other Ambulatory Visit: Payer: Self-pay

## 2011-06-28 DIAGNOSIS — I4891 Unspecified atrial fibrillation: Secondary | ICD-10-CM

## 2011-06-28 MED ORDER — METOPROLOL SUCCINATE ER 50 MG PO TB24: 50.0000 mg | ORAL_TABLET | Freq: Every day | ORAL | Status: DC

## 2011-06-30 ENCOUNTER — Telehealth: Payer: Self-pay | Admitting: Internal Medicine

## 2011-06-30 NOTE — Telephone Encounter (Signed)
06-30-11 per Baxter Hire pt needs device check w/[facility] in feb, may have to do march, lmm @ 1001am for pt to call to set up/mt

## 2011-07-03 ENCOUNTER — Other Ambulatory Visit: Payer: Self-pay

## 2011-07-03 DIAGNOSIS — I4891 Unspecified atrial fibrillation: Secondary | ICD-10-CM

## 2011-07-03 DIAGNOSIS — I428 Other cardiomyopathies: Secondary | ICD-10-CM

## 2011-07-03 MED ORDER — AMIODARONE HCL 200 MG PO TABS: 200.0000 mg | ORAL_TABLET | Freq: Every day | ORAL | Status: DC

## 2011-07-03 MED ORDER — METOPROLOL SUCCINATE ER 50 MG PO TB24: 50.0000 mg | ORAL_TABLET | Freq: Every day | ORAL | Status: DC

## 2011-07-03 NOTE — Telephone Encounter (Signed)
..   Requested Prescriptions   Signed Prescriptions Disp Refills  . metoprolol succinate (TOPROL-XL) 50 MG 24 hr tablet 30 tablet 1    Sig: Take 1 tablet (50 mg total) by mouth daily.    Authorizing Provider: Hillis Range D    Ordering User: Ayliana Casciano M  . amiodarone (PACERONE) 200 MG tablet 30 tablet 1    Sig: Take 1 tablet (200 mg total) by mouth daily.    Authorizing Provider: Hillis Range D    Ordering User: Christella Hartigan, Allante Beane Judie Petit

## 2011-07-13 ENCOUNTER — Other Ambulatory Visit: Payer: Self-pay | Admitting: *Deleted

## 2011-07-13 MED ORDER — DABIGATRAN ETEXILATE MESYLATE 150 MG PO CAPS: 150.0000 mg | ORAL_CAPSULE | Freq: Two times a day (BID) | ORAL | Status: DC

## 2011-07-18 ENCOUNTER — Encounter: Payer: Self-pay | Admitting: *Deleted

## 2011-08-15 ENCOUNTER — Telehealth: Payer: Self-pay | Admitting: Internal Medicine

## 2011-08-15 NOTE — Telephone Encounter (Signed)
Spoke with wife and made her aware of why they will not schedule the CT at present  Will have to admitt him first to hydrate

## 2011-08-15 NOTE — Telephone Encounter (Signed)
New Msg: Pt calling wanting to speak with nurse/MD regarding pt seeing Kidney MD and some information pt wife has regarding pt current health conditions and the status of his kidneys.  Please return pt call to discuss further.

## 2011-09-01 ENCOUNTER — Other Ambulatory Visit: Payer: Self-pay | Admitting: Internal Medicine

## 2011-09-01 NOTE — Telephone Encounter (Signed)
Pt will be called for appointment

## 2011-09-15 ENCOUNTER — Encounter: Payer: Medicare Other | Admitting: Internal Medicine

## 2011-10-10 ENCOUNTER — Other Ambulatory Visit: Payer: Self-pay | Admitting: Internal Medicine

## 2011-10-16 ENCOUNTER — Other Ambulatory Visit: Payer: Self-pay | Admitting: Internal Medicine

## 2012-02-19 DIAGNOSIS — K219 Gastro-esophageal reflux disease without esophagitis: Secondary | ICD-10-CM | POA: Insufficient documentation

## 2013-03-02 DIAGNOSIS — G40909 Epilepsy, unspecified, not intractable, without status epilepticus: Secondary | ICD-10-CM | POA: Insufficient documentation

## 2013-06-10 DIAGNOSIS — R05 Cough: Secondary | ICD-10-CM | POA: Insufficient documentation

## 2013-10-03 DIAGNOSIS — G5622 Lesion of ulnar nerve, left upper limb: Secondary | ICD-10-CM | POA: Insufficient documentation

## 2013-10-03 DIAGNOSIS — G5620 Lesion of ulnar nerve, unspecified upper limb: Secondary | ICD-10-CM

## 2013-10-03 DIAGNOSIS — M4802 Spinal stenosis, cervical region: Secondary | ICD-10-CM | POA: Insufficient documentation

## 2013-10-14 DIAGNOSIS — G5630 Lesion of radial nerve, unspecified upper limb: Secondary | ICD-10-CM | POA: Insufficient documentation

## 2014-02-02 DIAGNOSIS — R1011 Right upper quadrant pain: Secondary | ICD-10-CM | POA: Insufficient documentation

## 2014-02-03 DIAGNOSIS — I214 Non-ST elevation (NSTEMI) myocardial infarction: Secondary | ICD-10-CM | POA: Insufficient documentation

## 2014-02-03 DIAGNOSIS — Z950 Presence of cardiac pacemaker: Secondary | ICD-10-CM | POA: Insufficient documentation

## 2014-04-06 DIAGNOSIS — M25649 Stiffness of unspecified hand, not elsewhere classified: Secondary | ICD-10-CM | POA: Insufficient documentation

## 2014-04-06 DIAGNOSIS — M25642 Stiffness of left hand, not elsewhere classified: Secondary | ICD-10-CM

## 2014-06-17 DIAGNOSIS — R29810 Facial weakness: Secondary | ICD-10-CM | POA: Insufficient documentation

## 2014-06-17 DIAGNOSIS — G510 Bell's palsy: Secondary | ICD-10-CM | POA: Insufficient documentation

## 2014-06-17 DIAGNOSIS — R401 Stupor: Secondary | ICD-10-CM | POA: Insufficient documentation

## 2014-06-17 DIAGNOSIS — R410 Disorientation, unspecified: Secondary | ICD-10-CM | POA: Insufficient documentation

## 2015-05-27 ENCOUNTER — Telehealth: Payer: Self-pay | Admitting: *Deleted

## 2015-07-19 ENCOUNTER — Ambulatory Visit (INDEPENDENT_AMBULATORY_CARE_PROVIDER_SITE_OTHER): Payer: Medicare Other | Admitting: Cardiology

## 2015-07-19 ENCOUNTER — Encounter: Payer: Self-pay | Admitting: Cardiology

## 2015-07-19 VITALS — BP 136/74 | HR 56 | Ht 69.0 in | Wt 195.2 lb

## 2015-07-19 DIAGNOSIS — Z4502 Encounter for adjustment and management of automatic implantable cardiac defibrillator: Secondary | ICD-10-CM

## 2015-07-19 DIAGNOSIS — Z79899 Other long term (current) drug therapy: Secondary | ICD-10-CM

## 2015-07-19 DIAGNOSIS — I472 Ventricular tachycardia, unspecified: Secondary | ICD-10-CM

## 2015-07-19 DIAGNOSIS — N189 Chronic kidney disease, unspecified: Secondary | ICD-10-CM

## 2015-07-19 DIAGNOSIS — I480 Paroxysmal atrial fibrillation: Secondary | ICD-10-CM | POA: Diagnosis not present

## 2015-07-19 DIAGNOSIS — I5022 Chronic systolic (congestive) heart failure: Secondary | ICD-10-CM

## 2015-07-19 LAB — CUP PACEART INCLINIC DEVICE CHECK
HighPow Impedance: 49 Ohm
HighPow Impedance: 52 Ohm
Implantable Lead Implant Date: 20050922
Implantable Lead Location: 753860
Implantable Lead Model: 158
Implantable Lead Serial Number: 156891
Lead Channel Pacing Threshold Amplitude: 0.5 V
Lead Channel Pacing Threshold Amplitude: 1.1 V
Lead Channel Pacing Threshold Pulse Width: 0.4 ms
Lead Channel Setting Pacing Amplitude: 2 V
Lead Channel Setting Pacing Amplitude: 2.4 V
Lead Channel Setting Pacing Pulse Width: 0.4 ms
MDC IDC LEAD IMPLANT DT: 20050922
MDC IDC LEAD LOCATION: 753859
MDC IDC MSMT BATTERY REMAINING LONGEVITY: 66 mo
MDC IDC MSMT LEADCHNL RA IMPEDANCE VALUE: 504 Ohm
MDC IDC MSMT LEADCHNL RA PACING THRESHOLD PULSEWIDTH: 0.4 ms
MDC IDC MSMT LEADCHNL RA SENSING INTR AMPL: 3.4 mV
MDC IDC MSMT LEADCHNL RV IMPEDANCE VALUE: 459 Ohm
MDC IDC MSMT LEADCHNL RV SENSING INTR AMPL: 20.3 mV
MDC IDC PG SERIAL: 141895
MDC IDC SESS DTM: 20170227050000
MDC IDC SET LEADCHNL RV SENSING SENSITIVITY: 0.6 mV
MDC IDC STAT BRADY RA PERCENT PACED: 35 %
MDC IDC STAT BRADY RV PERCENT PACED: 1 % — AB

## 2015-07-19 LAB — TSH: TSH: 3.18 m[IU]/L (ref 0.40–4.50)

## 2015-07-19 LAB — COMPREHENSIVE METABOLIC PANEL
ALBUMIN: 4.3 g/dL (ref 3.6–5.1)
ALT: 25 U/L (ref 9–46)
AST: 30 U/L (ref 10–35)
Alkaline Phosphatase: 44 U/L (ref 40–115)
BILIRUBIN TOTAL: 0.5 mg/dL (ref 0.2–1.2)
BUN: 13 mg/dL (ref 7–25)
CO2: 26 mmol/L (ref 20–31)
CREATININE: 1.72 mg/dL — AB (ref 0.70–1.25)
Calcium: 9.9 mg/dL (ref 8.6–10.3)
Chloride: 104 mmol/L (ref 98–110)
Glucose, Bld: 74 mg/dL (ref 65–99)
Potassium: 4.1 mmol/L (ref 3.5–5.3)
Sodium: 140 mmol/L (ref 135–146)
TOTAL PROTEIN: 6.9 g/dL (ref 6.1–8.1)

## 2015-07-19 MED ORDER — AMIODARONE HCL 200 MG PO TABS: 400.0000 mg | ORAL_TABLET | Freq: Every day | ORAL | Status: DC

## 2015-07-19 NOTE — Progress Notes (Signed)
Electrophysiology Office Note   Date:  07/19/2015   ID:  Hamzeh Popwell, DOB Mar 05, 1946, MRN [id number]  PCP:  No PCP Per Patient Primary Electrophysiologist:  Tyrus Wilms Meredith Leeds, MD    Chief Complaint  Patient presents with  . New Patient (Initial Visit)     History of Present Illness: [name] is a 70 y.o. male who presents today for electrophysiology evaluation.   He was admitted to the hospital in [place] with what sounds to be ventricular tachycardia in November. Per his wife, they were going to transesophageal echo but decided not to as he has a vocal cord implant. It is unclear why the TEE was to be done. He did have an echocardiogram, but they do not know what his ejection fraction was. We are awaiting results from that hospital stay. Since then, he has had 8 episodes of VT since Apr 09, 2015 that have been treated, the last one in January. He continues to be short of breath. He can walk on flat ground, but if there is any bit of an incline, he gets very short of breath and has to rest. He sleeps on 2 pillows, and is using a CPAP for sleep apnea. He therefore does not know if he has any symptoms of PND. He does have chest pain which is substernal which helps to improve his pain. He has been short of breath for the last few years as well as had this same chest pain for that time.    Today, he denies symptoms of PND, lower extremity edema, claudication, dizziness, presyncope, syncope, bleeding, or neurologic sequela. The patient is tolerating medications without difficulties and is otherwise without complaint today.    Past Medical History  Diagnosis Date  . Ischemic cardiomyopathy     EF 35-45% in 2009  . Myocardial infarction ([place])     in  1986 with pci  to circumflex  . Atrial fibrillation ([place])     on pradaxa for stroke prevention  . Hypertension   . Stroke ([place])     in 2009, left fronto-temporal, due to a-fib  . Chronic systolic dysfunction of left ventricle     . GERD (gastroesophageal reflux disease)   . HLD (hyperlipidemia)   . Ventricular tachycardia ([place])     prior VT storm treated with amiodarone, followed by Dr. Rayann Heman, dual chamber defibrillator  . Obstructive sleep apnea     sees Dr. Don Broach  . DJD (degenerative joint disease)     of shoulder,  . CKD (chronic kidney disease), stage III 02/22/2011   Past Surgical History  Procedure Laterality Date  . Vocal cord surgery    . Icd implantation    . Hernia repair    . Angioplasty       Current Outpatient Prescriptions  Medication Sig Dispense Refill  . amiodarone (PACERONE) 200 MG tablet Take 1 tablet (200 mg total) by mouth daily. 30 tablet 1  . apixaban (ELIQUIS) 2.5 MG TABS tablet Take 2.5 mg by mouth 2 (two) times daily.    . fenofibrate 160 MG tablet Take 1 tablet (160 mg total) by mouth daily. 90 tablet 4  . isosorbide mononitrate (IMDUR) 30 MG 24 hr tablet 30 mg daily.      . Lacosamide (VIMPAT) 100 MG TABS Take 50 mg by mouth 2 (two) times daily. Take one-half tablet (50 mg) twice a daily.    Marland Kitchen levocetirizine (XYZAL) 5 MG tablet Take 5 mg by mouth every evening.    Marland Kitchen  metoprolol succinate (TOPROL-XL) 50 MG 24 hr tablet Take 1 tablet (50 mg total) by mouth daily. 30 tablet 1  . mirabegron ER (MYRBETRIQ) 25 MG TB24 tablet Take 25 mg by mouth daily.    . nitroGLYCERIN (NITROSTAT) 0.4 MG SL tablet Place 1 tablet (0.4 mg total) under the tongue every 5 (five) minutes as needed. 30 tablet 6  . tamsulosin (FLOMAX) 0.4 MG CAPS capsule Take 0.4 mg by mouth daily.     No current facility-administered medications for this visit.    Allergies:   Codeine; Omeprazole; Pantoprazole sodium; and Tramadol   Social History:  The patient  reports that he has quit smoking. He has never used smokeless tobacco. He reports that he does not drink alcohol or use illicit drugs.   Family History:  The patient's family history includes Diabetes in his sister; Heart attack in his father; Heart  disease in his father; Hyperlipidemia in his father; Hypertension in his father. There is no history of Sudden death.    ROS:  Please see the history of present illness.   Otherwise, review of systems is positive for fatigue, chest pain, cough, DOE, snoring, anxiety, depression, burising.   All other systems are reviewed and negative.    PHYSICAL EXAM: VS:  BP 136/74 mmHg  Pulse 56  Ht 5\' 9"  (1.753 m)  Wt 195 lb 3.2 oz (88.542 kg)  BMI 28.81 kg/m2 , BMI Body mass index is 28.81 kg/(m^2). GEN: Well nourished, well developed, in no acute distress HEENT: normal Neck: no JVD, carotid bruits, or masses Cardiac: RRR; no murmurs, rubs, or gallops,no edema  Respiratory:  clear to auscultation bilaterally, normal work of breathing GI: soft, nontender, nondistended, + BS MS: no deformity or atrophy Skin: warm and dry,  device pocket is well healed Neuro:  Strength and sensation are intact Psych: euthymic mood, full affect  EKG:  EKG is ordered today. The ekg ordered today shows sinus rhythm, LBBB  Device interrogation is reviewed today in detail.  See PaceArt for details.   Recent Labs: No results found for requested labs within last 365 days.    Lipid Panel     Component Value Date/Time   CHOL 181 12/19/2010 1130   TRIG 96 12/19/2010 1130   HDL 40 12/19/2010 1130   CHOLHDL 4.5 12/19/2010 1130   VLDL 19 12/19/2010 1130   LDLCALC 122* 12/19/2010 1130     Wt Readings from Last 3 Encounters:  07/19/15 195 lb 3.2 oz (88.542 kg)  04/26/11 183 lb (83.008 kg)  04/20/11 191 lb (86.637 kg)    ASSESSMENT AND PLAN:  1.  Ventricular tachycardia: has had multiple episodes since November with 8 being treated.  He is currently on 200 mg amiodarone. He is continuing to have ventricular tachycardia and has had 8 episodes of treated VT since November. Due to that I Emrah Ariola increase his amiodarone to 400 mg daily. I'm still waiting on records from when he was in [place] and his hospitalization  at that time, and may make further changes once records come in.  2. Atrial fibrillation: Is in sinus rhythm currently. On Pradaxa. This patients CHA2DS2-VASc Score and unadjusted Ischemic Stroke Rate (% per year) is equal to 9.7 % stroke rate/year from a score of 6  Above score calculated as 1 point each if present [CHF, HTN, DM, Vascular=MI/PAD/Aortic Plaque, Age if 65-74, or Male] Above score calculated as 2 points each if present [Age > 75, or Stroke/TIA/TE]  3. CKD III: referral  to nephrology  4. Acute on chronic systolic heart failure: He has been stage III in the past, and is continuing to have shortness of breath. I'm getting get a comprehensive metabolic panel to evaluate his renal function, and evaluate his hepatic function as he has been on amiodarone for an extended period of time. I Austen Wygant also check a TSH.  The patient presents today to establish care, though would like to follow with Dr. Rayann Heman as he initially implanted the patient's ICD. Currently we are working to get records from his [facility] hospitalization.  Current medicines are reviewed at length with the patient today.   The patient does not have concerns regarding his medicines.  The following changes were made today:  Increase amiodarone to 400 mg daily  Labs/ tests ordered today include:  No orders of the defined types were placed in this encounter.    Disposition:   FU with [name] 1  months  Signed, Bobbyjoe Pabst Meredith Leeds, MD  07/19/2015 3:00 PM     [place] [address] [phone] (office) [phone] (fax)

## 2015-07-19 NOTE — Patient Instructions (Addendum)
Medication Instructions:  Your physician has recommended you make the following change in your medication: 1) INCREASE Amiodarone to 400 mg daily  Labwork: Medication management labs today: CMET & TSH   Testing/Procedures: None ordered  Follow-Up: Your physician recommends that you schedule a follow-up appointment in: 1 month with Dr. Rayann Heman.   You have been referred to nephrology.  Any Other Special Instructions Will Be Listed Below (If Applicable). We will work on obtaining medical records from [place].  [place]  [phone]  If you need a refill on your cardiac medications before your next appointment, please call your pharmacy.  Thank you for choosing [facility]!!

## 2015-07-30 ENCOUNTER — Other Ambulatory Visit: Payer: Self-pay | Admitting: Cardiology

## 2015-07-30 NOTE — Telephone Encounter (Signed)
Can not fill the Vimpat per Trinidad Curet, RN

## 2015-07-30 NOTE — Telephone Encounter (Signed)
Patient wife called to request refill on husband's medication she stated that his appointment with primary care doctor is not till next week and he is completely out of his Vimpat and want to know if we can refill it.

## 2015-08-01 NOTE — Telephone Encounter (Signed)
Looks like he does not have an appt with me until may - I can not refill

## 2015-08-03 ENCOUNTER — Telehealth: Payer: Self-pay | Admitting: *Deleted

## 2015-08-03 NOTE — Telephone Encounter (Signed)
[id number] wife called back in regards to the request that she had made for our office to refill the patients seizure medication, vimpat. She was very upset that no one from our office called her back to make her aware that we were not able to refill this medication which is poor customer service on our part. She states "so you mean that your office would rather a patient be without their medication than to give at least a short term supply until they get in for an appointment with a pcp"? She requested that I document all of her verbalized concerns so that when the patient sees the pcp this week, they can see that our office refused to refill the medication. She tells me that the cardiologist in [place] would have been willing to refill this and had she known last week that we were not going to refill it, she could have contacted them, but she stated that instead just assumed that the refill was at the pharmacy since she did not receive a call from our office. She also stated that the medication is on the patients current med list and when he comes in he is always asked if he needs anything refilled. I made her aware that they are only referring to the cardiac meds. Patient only has 1.5 tablets left and she stated that he cannot be without this medication. She then proceeded to hang up.

## 2015-08-04 NOTE — Telephone Encounter (Signed)
Informed clinical services manager, Con Memos, about this matter - in case family calls back.

## 2015-08-05 ENCOUNTER — Ambulatory Visit (INDEPENDENT_AMBULATORY_CARE_PROVIDER_SITE_OTHER): Payer: Medicare Other | Admitting: Internal Medicine

## 2015-08-05 ENCOUNTER — Encounter: Payer: Self-pay | Admitting: Internal Medicine

## 2015-08-05 VITALS — BP 152/75 | HR 55 | Temp 98.0°F | Ht 69.0 in | Wt 199.6 lb

## 2015-08-05 DIAGNOSIS — N183 Chronic kidney disease, stage 3 unspecified: Secondary | ICD-10-CM

## 2015-08-05 DIAGNOSIS — N401 Enlarged prostate with lower urinary tract symptoms: Secondary | ICD-10-CM

## 2015-08-05 DIAGNOSIS — N400 Enlarged prostate without lower urinary tract symptoms: Secondary | ICD-10-CM | POA: Insufficient documentation

## 2015-08-05 DIAGNOSIS — Z8673 Personal history of transient ischemic attack (TIA), and cerebral infarction without residual deficits: Secondary | ICD-10-CM | POA: Diagnosis not present

## 2015-08-05 DIAGNOSIS — I10 Essential (primary) hypertension: Secondary | ICD-10-CM

## 2015-08-05 DIAGNOSIS — R3915 Urgency of urination: Secondary | ICD-10-CM

## 2015-08-05 DIAGNOSIS — E785 Hyperlipidemia, unspecified: Secondary | ICD-10-CM

## 2015-08-05 DIAGNOSIS — Z87898 Personal history of other specified conditions: Secondary | ICD-10-CM

## 2015-08-05 DIAGNOSIS — I6350 Cerebral infarction due to unspecified occlusion or stenosis of unspecified cerebral artery: Secondary | ICD-10-CM

## 2015-08-05 DIAGNOSIS — I129 Hypertensive chronic kidney disease with stage 1 through stage 4 chronic kidney disease, or unspecified chronic kidney disease: Secondary | ICD-10-CM

## 2015-08-05 DIAGNOSIS — G40909 Epilepsy, unspecified, not intractable, without status epilepticus: Secondary | ICD-10-CM

## 2015-08-05 MED ORDER — BLOOD PRESSURE CUFF MISC: 1.0000 | Freq: Every day | Status: DC

## 2015-08-05 MED ORDER — LACOSAMIDE 50 MG PO TABS: 50.0000 mg | ORAL_TABLET | Freq: Two times a day (BID) | ORAL | Status: DC

## 2015-08-05 MED ORDER — FINASTERIDE 5 MG PO TABS: 5.0000 mg | ORAL_TABLET | Freq: Every day | ORAL | Status: DC

## 2015-08-05 NOTE — Assessment & Plan Note (Addendum)
A: Reports persistent sensation of urinary urgency. Mirabegron is prescribed for this issue as well by a previous provider, which he indicates is helpful. He denies any new back pain or unintended weight loss.  P: Continue finasterid, tamsulosin, and mirabegron

## 2015-08-05 NOTE — Assessment & Plan Note (Signed)
A: Cr is stable in the ~1.7 range the last two visits. Acute deteriorations in his renal function in the past have been medication related (aricept, nexium, etc.). This is a patient in which I would be very wary about starting a new medication. I do not think a nephrology referral is needed at this time since this problem has been stable.  P: Continue conservative mgmt, BMET next visit.

## 2015-08-05 NOTE — Patient Instructions (Addendum)
[name], it was a joy meeting you and your wife today.  For your seizure disorder, I am refilling the Lacosamide (aka Vimpat), I have halved the dose so you don't have to split it any more. If you have any difficulty obtaining this medication, just call the office. Please do not drive until you have taken the Vimpat.  For your blood pressure, it was elevated today. This may be due to "white coat hypertension," so we have provided a blood pressure diary for you today. If it is still elevated, we may need to adjust your medication.  For your memory problems, we did a test, and it is not severe at the moment. However, we can repeat this in a year, to see if there is any decline.  We are also checking checking your cholesterol. If you don't hear from [place], it means it was fine.

## 2015-08-05 NOTE — Assessment & Plan Note (Signed)
A: Seizure semiology seems atypical, but both patient and wife say Vimpat has been helpful with "staring episodes." He had a recurrence of a staring episodes this AM after being on Vimpat for several days after running out. The patient had been driving safely on Vimpat per the wife, and he hasn't had an episode in years prior to this episode.  P: May resume driving after starting lacosamide and he has been event free for three days. Instructions were provided and the patient and wife verbalized understanding. This seems stable, and I do not think a neurology referral is needed at this time.

## 2015-08-05 NOTE — Assessment & Plan Note (Signed)
A: No recent lipid profile on fiile  P: Lipid profile today

## 2015-08-05 NOTE — Assessment & Plan Note (Signed)
A: Blood pressure is 152/75, but was noted to be normotensive (SBP 120s) recently. There have been no new medication adjustments. It is possible this breakthrough HTN is transient.  P: Will prescribe BP cuff and diary. May need to augment medication regimen depending on this data.

## 2015-08-05 NOTE — Assessment & Plan Note (Addendum)
A: Patient has had no additional strokes since moving to [place]. He has not been taking ASA due to acid reflux sx. He cannot take PPIs given that this causes worsening renal functioning in this patient.. His previous ASA was not enteric coated. Has had seizure-like "staring episodes" that have been well controlled on Vimpat, so we will resume this medication. MoCA today is 28 as described in physical exam.  P: Start enteric coated ASA 81 given his symptoms.  Do not start donepezil, this has worsened his renal function in the past

## 2015-08-05 NOTE — Progress Notes (Signed)
Internal Medicine Clinic Attending  Case discussed with Dr. Ford at the time of the visit.  We reviewed the resident's history and exam and pertinent patient test results.  I agree with the assessment, diagnosis, and plan of care documented in the resident's note.  

## 2015-08-05 NOTE — Progress Notes (Signed)
Subjective:    [name], male    DOB: 05-16-1946, 70 y.o.   MRN: [id number]  HPI  [name] is a pleasant 70 year old gentleman with a PMH as below who comes to the clinic to re-establish care and to discuss his seizure disorder and memory loss. [name] used to be a clinic patient here until 2013 when he moved to [place] to find a kidney doctor. He reports being placed on several medications and his kidney function improved. They found out that PPIs were driving his poor renal function, and these were discontinued. His other major medical problem are seizures that arose in the context of a previous stroke in 2009. His first episode occurred in 2014. Prior to starting the Vimpat, his wife describes his seizures as "staring off into space" lasting 10-15 minutes. Before the Vimpat, they were occurring 3-4 times daily. Due to difficulties in the transition of care, he has been without Vimpat for two days, and he had a staring episode this morning. This was the first episode in years.  Mr. and [name] also report some memory difficulties for a couple years, typified by instances of forgetting what to buy at the grocery store. A  MOCA was done in clinic and he scored 28/30, with difficulties on visuospatial/executive dysfunction. He was tried on Aricept in the past, but this apparently also affected his kidney function.   Active Ambulatory Problems    Diagnosis Date Noted  . HYPERLIPIDEMIA 12/23/2009  . OBSTRUCTIVE SLEEP APNEA 03/11/2009  . UNSPECIFIED HEARING LOSS 07/29/2007  . HYPERTENSION 09/18/2008  . CAD 04/12/2007  . Acute on chronic systolic heart failure ([place]) 04/12/2007  . VENTRICULAR TACHYCARDIA 12/23/2009  . Atrial fibrillation ([place]) 10/23/2007  . Cerebral artery occlusion with cerebral infarction ([place]) 09/06/2007  . SHOULDER PAIN, LEFT 10/23/2007  . Special screening for malignant neoplasms, colon 08/29/2010  . Imbalance 02/22/2011  . Shortness of  breath 02/22/2011  . Fatigue 02/22/2011  . CKD (chronic kidney disease), stage III 02/22/2011   Resolved Ambulatory Problems    Diagnosis Date Noted  . History of stroke 08/05/2015   Past Medical History  Diagnosis Date  . Ischemic cardiomyopathy   . Myocardial infarction ([place])   . Hypertension   . Stroke ([place])   . Chronic systolic dysfunction of left ventricle   . GERD (gastroesophageal reflux disease)   . HLD (hyperlipidemia)   . Ventricular tachycardia ([place])   . Obstructive sleep apnea   . DJD (degenerative joint disease)      Review of Systems  Constitutional: Negative for fever and fatigue.  HENT: Negative for congestion and sore throat.   Eyes: Negative for photophobia and visual disturbance.  Respiratory: Positive for shortness of breath. Negative for cough.   Cardiovascular: Negative for chest pain, palpitations and leg swelling.  Gastrointestinal: Negative for nausea, vomiting, abdominal pain and diarrhea.  Endocrine: Negative for polydipsia and polyuria.  Genitourinary: Positive for urgency. Negative for decreased urine volume and difficulty urinating.  Musculoskeletal: Negative for back pain and arthralgias.  Skin: Negative for pallor and rash.  Neurological: Negative for dizziness.       Memory problem  Psychiatric/Behavioral: Negative for confusion and dysphoric mood.       Objective:   Physical Exam  Constitutional: He is oriented to person, place, and time. He appears well-developed and well-nourished. No distress.  HENT:  Head: Normocephalic and atraumatic.  Mouth/Throat: Oropharynx is clear and moist. No oropharyngeal exudate.  Eyes: EOM  are normal. No scleral icterus.  Neck: Normal range of motion. Neck supple.  Cardiovascular: Normal rate, regular rhythm and normal heart sounds.   Pulmonary/Chest: Effort normal and breath sounds normal. No respiratory distress. He has no wheezes.  Abdominal: Soft. Bowel sounds are normal. He exhibits no distension.  There is no tenderness.  Musculoskeletal: He exhibits no edema or tenderness.  Lymphadenopathy:    He has no cervical adenopathy.  Neurological: He is alert and oriented to person, place, and time. Coordination normal.  28/30 on MoCA exam. Lost 3 points on visuospatial/exec function. Gained 1 due to not finishing high school.  Skin: Skin is warm and dry. He is not diaphoretic.  Psychiatric: He has a normal mood and affect. His behavior is normal.  Vitals reviewed.         Assessment & Plan:   Please see problem based A&P for details.

## 2015-08-06 ENCOUNTER — Other Ambulatory Visit: Payer: Self-pay | Admitting: Licensed Clinical Social Worker

## 2015-08-06 DIAGNOSIS — I5023 Acute on chronic systolic (congestive) heart failure: Secondary | ICD-10-CM

## 2015-08-06 DIAGNOSIS — I10 Essential (primary) hypertension: Secondary | ICD-10-CM

## 2015-08-06 LAB — LIPID PANEL
CHOL/HDL RATIO: 4.3 ratio (ref 0.0–5.0)
CHOLESTEROL TOTAL: 137 mg/dL (ref 100–199)
HDL: 32 mg/dL — AB (ref >39)
LDL Calculated: 86 mg/dL (ref 0–99)
TRIGLYCERIDES: 95 mg/dL (ref 0–149)
VLDL Cholesterol Cal: 19 mg/dL (ref 5–40)

## 2015-08-06 NOTE — Progress Notes (Signed)
Pt presents to [facility] on 08/05/15 to establish care in [place] after relocating from [place].  Spouse requesting assistance for patient to obtain a BP cuff.  Spouse is primary caregiver to patient and spouse's disabled adult son.  CSW will refer to [facility] for potential assistance.  Pt may not be [facility] eligible at this time but will await response from [facility].

## 2015-08-11 ENCOUNTER — Telehealth: Payer: Self-pay | Admitting: Cardiology

## 2015-08-11 NOTE — Telephone Encounter (Signed)
Records received from [facility], gave to [facility]

## 2015-08-16 ENCOUNTER — Encounter: Payer: Self-pay | Admitting: *Deleted

## 2015-08-16 NOTE — Progress Notes (Signed)
referral to [facility] faxed on 08/16/2015  Received: Today    Megan Salon McVey  Stanton Kidney, RN   (referral started after receiving medical records last week)

## 2015-08-20 NOTE — Progress Notes (Signed)
Hornbeck T Pegram    Sent: Thu August 19, 2015 8:37 AM    To: Stanton Kidney, RN        Message     Pt was rated a 4 and will be scheduled in 3-4 month appt. Per stacey from [facility].         sonya

## 2015-08-25 ENCOUNTER — Encounter: Payer: Medicare Other | Admitting: Internal Medicine

## 2015-08-26 NOTE — Addendum Note (Signed)
Addended by: Hulan Fray on: 08/26/2015 03:11 PM   Modules accepted: Orders

## 2015-09-01 ENCOUNTER — Other Ambulatory Visit: Payer: Self-pay | Admitting: *Deleted

## 2015-09-01 DIAGNOSIS — E785 Hyperlipidemia, unspecified: Secondary | ICD-10-CM

## 2015-09-01 DIAGNOSIS — I5023 Acute on chronic systolic (congestive) heart failure: Secondary | ICD-10-CM

## 2015-09-02 ENCOUNTER — Ambulatory Visit: Payer: Medicare Other | Admitting: Internal Medicine

## 2015-09-06 ENCOUNTER — Encounter: Payer: Self-pay | Admitting: Internal Medicine

## 2015-09-06 ENCOUNTER — Ambulatory Visit (INDEPENDENT_AMBULATORY_CARE_PROVIDER_SITE_OTHER): Payer: Medicare Other | Admitting: Internal Medicine

## 2015-09-06 ENCOUNTER — Telehealth: Payer: Self-pay | Admitting: Cardiology

## 2015-09-06 VITALS — BP 178/86 | HR 59 | Temp 98.1°F | Ht 69.0 in | Wt 199.2 lb

## 2015-09-06 VITALS — BP 158/94 | HR 65 | Ht 69.0 in | Wt 197.4 lb

## 2015-09-06 DIAGNOSIS — I482 Chronic atrial fibrillation, unspecified: Secondary | ICD-10-CM

## 2015-09-06 DIAGNOSIS — Z7901 Long term (current) use of anticoagulants: Secondary | ICD-10-CM

## 2015-09-06 DIAGNOSIS — I129 Hypertensive chronic kidney disease with stage 1 through stage 4 chronic kidney disease, or unspecified chronic kidney disease: Secondary | ICD-10-CM | POA: Diagnosis not present

## 2015-09-06 DIAGNOSIS — I472 Ventricular tachycardia, unspecified: Secondary | ICD-10-CM

## 2015-09-06 DIAGNOSIS — Z95810 Presence of automatic (implantable) cardiac defibrillator: Secondary | ICD-10-CM | POA: Diagnosis not present

## 2015-09-06 DIAGNOSIS — I10 Essential (primary) hypertension: Secondary | ICD-10-CM

## 2015-09-06 DIAGNOSIS — I480 Paroxysmal atrial fibrillation: Secondary | ICD-10-CM | POA: Diagnosis not present

## 2015-09-06 DIAGNOSIS — I4891 Unspecified atrial fibrillation: Secondary | ICD-10-CM | POA: Diagnosis not present

## 2015-09-06 DIAGNOSIS — I4729 Other ventricular tachycardia: Secondary | ICD-10-CM

## 2015-09-06 DIAGNOSIS — Z79899 Other long term (current) drug therapy: Secondary | ICD-10-CM

## 2015-09-06 DIAGNOSIS — I255 Ischemic cardiomyopathy: Secondary | ICD-10-CM

## 2015-09-06 DIAGNOSIS — Z9109 Other allergy status, other than to drugs and biological substances: Secondary | ICD-10-CM

## 2015-09-06 DIAGNOSIS — E785 Hyperlipidemia, unspecified: Secondary | ICD-10-CM

## 2015-09-06 DIAGNOSIS — N183 Chronic kidney disease, stage 3 unspecified: Secondary | ICD-10-CM

## 2015-09-06 DIAGNOSIS — Z87891 Personal history of nicotine dependence: Secondary | ICD-10-CM

## 2015-09-06 DIAGNOSIS — N400 Enlarged prostate without lower urinary tract symptoms: Secondary | ICD-10-CM

## 2015-09-06 DIAGNOSIS — Z87898 Personal history of other specified conditions: Secondary | ICD-10-CM

## 2015-09-06 DIAGNOSIS — J302 Other seasonal allergic rhinitis: Secondary | ICD-10-CM

## 2015-09-06 DIAGNOSIS — E559 Vitamin D deficiency, unspecified: Secondary | ICD-10-CM | POA: Diagnosis not present

## 2015-09-06 DIAGNOSIS — R0602 Shortness of breath: Secondary | ICD-10-CM

## 2015-09-06 DIAGNOSIS — G40909 Epilepsy, unspecified, not intractable, without status epilepticus: Secondary | ICD-10-CM

## 2015-09-06 DIAGNOSIS — G4733 Obstructive sleep apnea (adult) (pediatric): Secondary | ICD-10-CM

## 2015-09-06 LAB — CUP PACEART INCLINIC DEVICE CHECK
Brady Statistic RV Percent Paced: 1 % — CL
Date Time Interrogation Session: 20170417132910
HIGH POWER IMPEDANCE MEASURED VALUE: 55 Ohm
Implantable Lead Location: 753859
Implantable Lead Model: 5076
Lead Channel Impedance Value: 473 Ohm
Lead Channel Pacing Threshold Amplitude: 0.5 V
Lead Channel Sensing Intrinsic Amplitude: 2.9 mV
Lead Channel Sensing Intrinsic Amplitude: 25 mV
Lead Channel Setting Sensing Sensitivity: 0.6 mV
MDC IDC LEAD IMPLANT DT: 20050922
MDC IDC LEAD IMPLANT DT: 20050922
MDC IDC LEAD LOCATION: 753860
MDC IDC LEAD MODEL: 158
MDC IDC LEAD SERIAL: 156891
MDC IDC MSMT BATTERY REMAINING LONGEVITY: 66 mo
MDC IDC MSMT LEADCHNL RA IMPEDANCE VALUE: 492 Ohm
MDC IDC MSMT LEADCHNL RA PACING THRESHOLD PULSEWIDTH: 0.4 ms
MDC IDC MSMT LEADCHNL RV PACING THRESHOLD AMPLITUDE: 1.1 V
MDC IDC MSMT LEADCHNL RV PACING THRESHOLD PULSEWIDTH: 0.4 ms
MDC IDC SET LEADCHNL RA PACING AMPLITUDE: 2 V
MDC IDC SET LEADCHNL RV PACING AMPLITUDE: 2.4 V
MDC IDC SET LEADCHNL RV PACING PULSEWIDTH: 0.4 ms
MDC IDC STAT BRADY RA PERCENT PACED: 47 %
Pulse Gen Serial Number: 141895

## 2015-09-06 MED ORDER — AMIODARONE HCL 200 MG PO TABS: 200.0000 mg | ORAL_TABLET | Freq: Every day | ORAL | Status: DC

## 2015-09-06 MED ORDER — TAMSULOSIN HCL 0.4 MG PO CAPS: 0.4000 mg | ORAL_CAPSULE | Freq: Every day | ORAL | Status: DC

## 2015-09-06 MED ORDER — FENOFIBRATE 160 MG PO TABS: 160.0000 mg | ORAL_TABLET | Freq: Every day | ORAL | Status: DC

## 2015-09-06 MED ORDER — METOPROLOL SUCCINATE ER 100 MG PO TB24: 100.0000 mg | ORAL_TABLET | Freq: Every day | ORAL | Status: DC

## 2015-09-06 MED ORDER — APIXABAN 2.5 MG PO TABS: 2.5000 mg | ORAL_TABLET | Freq: Two times a day (BID) | ORAL | Status: DC

## 2015-09-06 MED ORDER — AMLODIPINE BESYLATE 5 MG PO TABS: 5.0000 mg | ORAL_TABLET | Freq: Every day | ORAL | Status: DC

## 2015-09-06 MED ORDER — ROSUVASTATIN CALCIUM 40 MG PO TABS: 40.0000 mg | ORAL_TABLET | Freq: Every day | ORAL | Status: DC

## 2015-09-06 MED ORDER — LACOSAMIDE 50 MG PO TABS: 50.0000 mg | ORAL_TABLET | Freq: Two times a day (BID) | ORAL | Status: DC

## 2015-09-06 NOTE — Addendum Note (Signed)
Addended by: Norman Herrlich on: 09/06/2015 02:13 PM   Modules accepted: Orders

## 2015-09-06 NOTE — Assessment & Plan Note (Signed)
Referral placed for sleep studies for his CPAP.

## 2015-09-06 NOTE — Progress Notes (Signed)
Case discussed with Dr. Truong at the time of the visit.  We reviewed the resident's history and exam and pertinent patient test results.  I agree with the assessment, diagnosis, and plan of care documented in the resident's note. 

## 2015-09-06 NOTE — Progress Notes (Signed)
Subjective:   [name] male   DOB: [birth year] 70 y.o.   MRN: [id number]  HPI: [name] is a 70 y.o. with past medical history as outlined below who presents to clinic for HTN f/u.   Please see problem list for status of the pt's chronic medical problems.  Past Medical History  Diagnosis Date  . Ischemic cardiomyopathy     EF 35-45% in 2009  . Myocardial infarction ([place])     in  1986 with pci  to circumflex  . Atrial fibrillation ([place])     on pradaxa for stroke prevention  . Hypertension   . Stroke ([place])     in 2009, left fronto-temporal, due to a-fib  . Chronic systolic dysfunction of left ventricle   . GERD (gastroesophageal reflux disease)   . HLD (hyperlipidemia)   . Ventricular tachycardia ([place])     prior VT storm treated with amiodarone, followed by Dr. Rayann Heman, dual chamber defibrillator  . Obstructive sleep apnea     sees Dr. Don Broach  . DJD (degenerative joint disease)     of shoulder,  . CKD (chronic kidney disease), stage III 02/22/2011   Current Outpatient Prescriptions  Medication Sig Dispense Refill  . amiodarone (PACERONE) 200 MG tablet TAKE 2 TABLETS BY MOUTH DAILY 180 tablet 0  . apixaban (ELIQUIS) 2.5 MG TABS tablet Take 2.5 mg by mouth 2 (two) times daily.    Marland Kitchen aspirin EC 81 MG tablet Take 81 mg by mouth daily.    . Blood Pressure Monitoring (BLOOD PRESSURE CUFF) MISC 1 each by Does not apply route daily. 1 each 0  . fenofibrate 160 MG tablet Take 1 tablet (160 mg total) by mouth daily. 90 tablet 4  . finasteride (PROSCAR) 5 MG tablet Take 1 tablet (5 mg total) by mouth daily. 30 tablet 2  . isosorbide mononitrate (IMDUR) 30 MG 24 hr tablet 30 mg daily.      Marland Kitchen lacosamide (VIMPAT) 50 MG TABS tablet Take 1 tablet (50 mg total) by mouth 2 (two) times daily. 120 tablet 0  . levocetirizine (XYZAL) 5 MG tablet Take 5 mg by mouth every evening.    . metoprolol succinate (TOPROL-XL) 50 MG 24 hr tablet Take 1 tablet (50 mg total) by  mouth daily. 30 tablet 1  . mirabegron ER (MYRBETRIQ) 25 MG TB24 tablet Take 25 mg by mouth daily.    . nitroGLYCERIN (NITROSTAT) 0.4 MG SL tablet Place 1 tablet (0.4 mg total) under the tongue every 5 (five) minutes as needed. 30 tablet 6  . rosuvastatin (CRESTOR) 40 MG tablet Take 40 mg by mouth daily.    . tamsulosin (FLOMAX) 0.4 MG CAPS capsule Take 0.4 mg by mouth daily.     No current facility-administered medications for this visit.   Family History  Problem Relation Age of Onset  . Heart disease Father   . Hyperlipidemia Father   . Hypertension Father   . Heart attack Father   . Diabetes Sister   . Sudden death Neg Hx    Social History   Social History  . Marital Status: Married    Spouse Name: N/A  . Number of Children: N/A  . Years of Education: N/A   Occupational History  . Retired    Social History Main Topics  . Smoking status: Former Research scientist (life sciences)  . Smokeless tobacco: Never Used  . Alcohol Use: No  . Drug Use: No  . Sexual Activity: Not Asked   Other  Topics Concern  . None   Social History Narrative   ICD-[employer]- Yes   Financial Assistance:  Application initiated.  Patient needs to submit further paperwork to complete per Bonna Gains 02/18/2010.   Financial Assistance: approved for 100% discount after Medicare pays for [facility] only, not eligible for [facility] card per Bonna Gains 04/29/10.            Review of Systems: Review of Systems  Respiratory: Positive for shortness of breath (x 1 year).   Cardiovascular: Negative for chest pain, palpitations and leg swelling.  Genitourinary: Negative.   Endo/Heme/Allergies: Positive for environmental allergies.    Objective:  Physical Exam: Filed Vitals:   09/06/15 0954  BP: 184/96  Pulse: 59  Temp: 98.1 F (36.7 C)  TempSrc: Oral  Height: 5\' 9"  (1.753 m)  Weight: 199 lb 3.2 oz (90.357 kg)  SpO2: 100%   Physical Exam  Constitutional: He appears well-developed and well-nourished. No  distress.  HENT:  Head: Normocephalic and atraumatic.  Nose: Nose normal.  Eyes: Conjunctivae and EOM are normal. No scleral icterus.  Cardiovascular: Normal rate, regular rhythm and normal heart sounds.  Exam reveals no gallop and no friction rub.   No murmur heard. Pulmonary/Chest: Breath sounds normal. No respiratory distress. He has no wheezes. He has no rales.  Abdominal: Soft. Bowel sounds are normal. He exhibits no distension. There is no tenderness. There is no rebound and no guarding.  Skin: Skin is warm and dry. No rash noted. He is not diaphoretic. No erythema. No pallor.    Assessment & Plan:   Please see problem based assessment and plan.

## 2015-09-06 NOTE — Patient Instructions (Signed)
Medication Instructions:  1) Decrease Amiodarone to 200 mg daily 2) Increase Toprol to 100 mg daily   Labwork: None ordered   Testing/Procedures: Your physician has requested that you have an echocardiogram. Echocardiography is a painless test that uses sound waves to create images of your heart. It provides your doctor with information about the size and shape of your heart and how well your heart's chambers and valves are working. This procedure takes approximately one hour. There are no restrictions for this procedure.     Follow-Up: Your physician recommends that you schedule a follow-up appointment in: 6 weeks with Dr Rayann Heman   Referral is in for [facility] into this       Any Other Special Instructions Will Be Listed Below (If Applicable).     If you need a refill on your cardiac medications before your next appointment, please call your pharmacy.

## 2015-09-06 NOTE — Assessment & Plan Note (Signed)
Pt has had progressive SOB with activity over the past year. He just moved to [facility] from [place] and last ECHO on EPIC is 2009. He follows w/ Dr. Rayann Heman for his ICD who he will see today at 12:30pm. He denies CP, palpitations, or leg edema. On exam lungs are clear and he appears euvolemic. Possible that EF has decreased causing SOB. He previously smoked but has not smoked in 26 years. He is on amiodarone which can cause pulmonary fibrosis which could be a cause of his SOB.   - will f/u cardiology notes - ordered PFTs as pt is on amiodarone, will need this yearly - can order an ECHO in the future

## 2015-09-06 NOTE — Assessment & Plan Note (Signed)
BP Readings from Last 3 Encounters:  09/06/15 184/96  08/05/15 152/75  07/19/15 136/74    Lab Results  Component Value Date   NA 140 07/19/2015   K 4.1 07/19/2015   CREATININE 1.72* 07/19/2015    Assessment: Blood pressure control:  uncontrolled Progress toward BP goal:   not at goal Comments: on recheck SBP in the 170's. On toprol xl 50mg .   Plan: Medications:  continue current medications, added norvasc 5mg  qd Educational resources provided: brochure (denies need ) Self management tools provided: home blood pressure logbook Other plans: f/u in 1 month for BP check.

## 2015-09-06 NOTE — Assessment & Plan Note (Signed)
Stable. Refilled flomax.

## 2015-09-06 NOTE — Assessment & Plan Note (Signed)
Stable. Refilled xyzal.

## 2015-09-06 NOTE — Assessment & Plan Note (Signed)
Pt requesting refill of vit d 50,000 today. Checking vit D level today prior to refilling med.

## 2015-09-06 NOTE — Assessment & Plan Note (Signed)
Stable, refilled vimpat.

## 2015-09-06 NOTE — Patient Instructions (Signed)
Start taking Norvasc 5mg  daily for your blood pressure.

## 2015-09-06 NOTE — Assessment & Plan Note (Signed)
Denies CP or palpitations. Had an ICD in place. Refilled eliquis.

## 2015-09-06 NOTE — Assessment & Plan Note (Signed)
Stable. Refilled crestor and fenofibrate.

## 2015-09-06 NOTE — Telephone Encounter (Signed)
New message   Pt wife is calling to speak to rn about the referral to [facility]

## 2015-09-06 NOTE — Progress Notes (Signed)
Electrophysiology Office Note   Date:  09/06/2015   ID:  [name], DOB [birth year], MRN [id number]  PCP:  [name], MD   Primary Electrophysiologist: [name], MD    Chief Complaint  Patient presents with  . Shortness of Breath     History of Present Illness: [name] is a 70 y.o. male who presents today for electrophysiology evaluation.   The patient recent moved back from [place] to [facility] (Dr [employer] note reviewed).  I saw him last in 2012.  He has had recent recurrence of ventricular tachycardia despite medical therapy with amiodarone.  He is mostly asymptomatic and receives ATP therapy.  He has SOB with moderate activity.  He remains active.  Today, he denies symptoms of palpitations, chest pain, orthopnea, PND, lower extremity edema, claudication, dizziness, presyncope, syncope, bleeding, or neurologic sequela. The patient is tolerating medications without difficulties and is otherwise without complaint today.    Past Medical History  Diagnosis Date  . Ischemic cardiomyopathy     EF 35-45% in 2009  . Myocardial infarction ([place])     in  1986 with pci  to circumflex  . Atrial fibrillation ([place])     on pradaxa for stroke prevention  . Hypertension   . Stroke ([place])     in 2009, left fronto-temporal, due to a-fib  . Chronic systolic dysfunction of left ventricle   . GERD (gastroesophageal reflux disease)   . HLD (hyperlipidemia)   . Ventricular tachycardia ([place])     prior VT storm treated with amiodarone, followed by Dr. Rayann Heman, dual chamber defibrillator  . Obstructive sleep apnea     sees Dr. Don Broach  . DJD (degenerative joint disease)     of shoulder,  . CKD (chronic kidney disease), stage III 02/22/2011   Past Surgical History  Procedure Laterality Date  . Vocal cord surgery    . Icd implantation    . Hernia repair    . Angioplasty       Current Outpatient Prescriptions  Medication Sig Dispense Refill  . amiodarone (PACERONE)  200 MG tablet Take 1 tablet (200 mg total) by mouth daily. 180 tablet 0  . amLODipine (NORVASC) 5 MG tablet Take 1 tablet (5 mg total) by mouth daily. 30 tablet 11  . apixaban (ELIQUIS) 2.5 MG TABS tablet Take 1 tablet (2.5 mg total) by mouth 2 (two) times daily. 60 tablet 0  . Blood Pressure Monitoring (BLOOD PRESSURE CUFF) MISC 1 each by Does not apply route daily. 1 each 0  . fenofibrate 160 MG tablet Take 1 tablet (160 mg total) by mouth daily. 90 tablet 4  . finasteride (PROSCAR) 5 MG tablet Take 1 tablet (5 mg total) by mouth daily. 30 tablet 2  . isosorbide mononitrate (IMDUR) 30 MG 24 hr tablet 30 mg daily.      Marland Kitchen lacosamide (VIMPAT) 50 MG TABS tablet Take 1 tablet (50 mg total) by mouth 2 (two) times daily. 60 tablet 0  . levocetirizine (XYZAL) 5 MG tablet Take 5 mg by mouth every evening.    . metoprolol succinate (TOPROL-XL) 100 MG 24 hr tablet Take 1 tablet (100 mg total) by mouth daily. 90 tablet 3  . mirabegron ER (MYRBETRIQ) 25 MG TB24 tablet Take 25 mg by mouth daily.    . nitroGLYCERIN (NITROSTAT) 0.4 MG SL tablet Place 1 tablet (0.4 mg total) under the tongue every 5 (five) minutes as needed. 30 tablet 6  . rosuvastatin (CRESTOR) 40 MG tablet Take 1  tablet (40 mg total) by mouth daily. 30 tablet 0  . tamsulosin (FLOMAX) 0.4 MG CAPS capsule Take 1 capsule (0.4 mg total) by mouth daily. 30 capsule 0   No current facility-administered medications for this visit.    Allergies:   Aricept; Codeine; Nexium; Omeprazole; Pantoprazole sodium; and Tramadol   Social History:  The patient  reports that he has quit smoking. He has never used smokeless tobacco. He reports that he does not drink alcohol or use illicit drugs.   Family History:  The patient's family history includes Diabetes in his sister; Heart attack in his father; Heart disease in his father; Hyperlipidemia in his father; Hypertension in his father. There is no history of Sudden death.    ROS:  Please see the history of  present illness.   All other systems are reviewed and negative.    PHYSICAL EXAM: VS:  BP 158/94 mmHg  Pulse 65  Ht 5\' 9"  (1.753 m)  Wt 197 lb 6.4 oz (89.54 kg)  BMI 29.14 kg/m2 , BMI Body mass index is 29.14 kg/(m^2). GEN: Well nourished, well developed, in no acute distress HEENT: normal Neck: no JVD, carotid bruits, or masses Cardiac: RRR; no murmurs, rubs, or gallops,no edema  Respiratory:  clear to auscultation bilaterally, normal work of breathing GI: soft, nontender, nondistended, + BS MS: no deformity or atrophy Skin: warm and dry, device pocket is well healed Neuro:  Strength and sensation are intact Psych: euthymic mood, full affect  EKG:  EKG is ordered today. The ekg ordered today shows sinus rhythm with LBBB (QRS 146 msec)  Device interrogation is reviewed today in detail.  See PaceArt for details.   Recent Labs: 07/19/2015: ALT 25; BUN 13; Creat 1.72*; Potassium 4.1; Sodium 140; TSH 3.18    Lipid Panel     Component Value Date/Time   CHOL 137 08/05/2015 1108   CHOL 181 12/19/2010 1130   TRIG 95 08/05/2015 1108   HDL 32* 08/05/2015 1108   HDL 40 12/19/2010 1130   CHOLHDL 4.3 08/05/2015 1108   CHOLHDL 4.5 12/19/2010 1130   VLDL 19 12/19/2010 1130   LDLCALC 86 08/05/2015 1108   LDLCALC 122* 12/19/2010 1130     Wt Readings from Last 3 Encounters:  09/06/15 197 lb 6.4 oz (89.54 kg)  09/06/15 199 lb 3.2 oz (90.357 kg)  08/05/15 199 lb 9.6 oz (90.538 kg)      Other studies Reviewed: Additional studies/ records that were reviewed today include: Dr Macky Lower note, 52 pages of records from [place]  Review of the above records today demonstrates: as above   ASSESSMENT AND PLAN:  1.  VT 6 episodes of ATP terminated VT since his last visit Reduce amiodarone to 200mg  daily Increase toprol XL to 100mg  daily May consider ablation if episodes continue No driving x 6 months (pt aware)  2. Ischemic CM/ chronic systolic dysfunction Obtain an echo euvolemic  today   3. Chronic renal failure Renal function has improved He is clear that he would like to follow with Nephrology Recent referral has been placed  4. HTN Stable No change required today  5. Afib Continue on eliquis   Follow-up: return to see me in 2 months [place]  Current medicines are reviewed at length with the patient today.   The patient does not have concerns regarding his medicines.  The following changes were made today:  none  Labs/ tests ordered today include:  Orders Placed This Encounter  Procedures  . Implantable device check  .  ECHOCARDIOGRAM COMPLETE     Signed, [name], MD  09/06/2015 5:12 PM     [place] [address] [phone] (office) [phone] (fax)

## 2015-09-06 NOTE — Assessment & Plan Note (Signed)
Cr stable in February. Checking UA today.

## 2015-09-07 ENCOUNTER — Encounter: Payer: Self-pay | Admitting: *Deleted

## 2015-09-07 LAB — MICROSCOPIC EXAMINATION: CASTS: NONE SEEN /LPF

## 2015-09-07 LAB — URINALYSIS, COMPLETE
Bilirubin, UA: NEGATIVE
Glucose, UA: NEGATIVE
Ketones, UA: NEGATIVE
LEUKOCYTES UA: NEGATIVE
Nitrite, UA: NEGATIVE
PH UA: 5.5 (ref 5.0–7.5)
PROTEIN UA: NEGATIVE
RBC UA: NEGATIVE
Specific Gravity, UA: 1.023 (ref 1.005–1.030)
UUROB: 0.2 mg/dL (ref 0.2–1.0)

## 2015-09-07 LAB — VITAMIN D 25 HYDROXY (VIT D DEFICIENCY, FRACTURES): Vit D, 25-Hydroxy: 41.5 ng/mL (ref 30.0–100.0)

## 2015-09-07 NOTE — Telephone Encounter (Addendum)
Spoke with patient's wife (ok per DPR on file) who is requesting sooner appt w/ nephrology. Informed her that referral was placed last month and his records, from [place], were faxed there on 08/16/15.  Their office contacted ours and informed us that patient was rated a 4 and it would be 3-4 months for appointment. Explained that I did not know what a rating of 4 meant and to contact their office for clarification and to discuss sooner appt.  Wife then tells me that she discussed this with Claiborne Billings, RN (Dr. Jackalyn Lombard nurse) yesterday who was going to look into this for her to request sooner appointment. I reviewed with Claiborne Billings, RN --  I will fax over [place] office visit from yesterday and ask [facility] to contact patient to discuss need for sooner appointment. Office note and letter asking them to contact patient was  faxed to their office at [phone] Patient's wife made aware.

## 2015-09-08 ENCOUNTER — Ambulatory Visit (HOSPITAL_COMMUNITY): Payer: Medicaid Other | Attending: Cardiovascular Disease

## 2015-09-08 ENCOUNTER — Other Ambulatory Visit: Payer: Self-pay

## 2015-09-08 DIAGNOSIS — I255 Ischemic cardiomyopathy: Secondary | ICD-10-CM | POA: Insufficient documentation

## 2015-09-08 DIAGNOSIS — I119 Hypertensive heart disease without heart failure: Secondary | ICD-10-CM | POA: Diagnosis not present

## 2015-09-08 DIAGNOSIS — I252 Old myocardial infarction: Secondary | ICD-10-CM | POA: Insufficient documentation

## 2015-09-08 DIAGNOSIS — I340 Nonrheumatic mitral (valve) insufficiency: Secondary | ICD-10-CM | POA: Insufficient documentation

## 2015-09-08 DIAGNOSIS — I4891 Unspecified atrial fibrillation: Secondary | ICD-10-CM | POA: Diagnosis present

## 2015-09-08 DIAGNOSIS — E785 Hyperlipidemia, unspecified: Secondary | ICD-10-CM | POA: Diagnosis not present

## 2015-09-08 DIAGNOSIS — I480 Paroxysmal atrial fibrillation: Secondary | ICD-10-CM | POA: Diagnosis not present

## 2015-09-13 NOTE — Addendum Note (Signed)
Addended by: Freada Bergeron on: 09/13/2015 04:30 PM   Modules accepted: Orders

## 2015-09-17 NOTE — Progress Notes (Signed)
Nephrology appt Dr Richardo Priest T Pegram    Sent: Fri September 17, 2015 3:53 PM    To: [facility], RN        Message     Nephrology appt Dr Fleet Contras 05/12 @ 10:45         sonya

## 2015-09-21 ENCOUNTER — Other Ambulatory Visit: Payer: Self-pay

## 2015-09-21 MED ORDER — LEVOCETIRIZINE DIHYDROCHLORIDE 5 MG PO TABS: 5.0000 mg | ORAL_TABLET | Freq: Every evening | ORAL | Status: DC

## 2015-09-22 ENCOUNTER — Ambulatory Visit: Payer: Medicare Other | Admitting: Internal Medicine

## 2015-09-22 ENCOUNTER — Encounter: Payer: Self-pay | Admitting: Internal Medicine

## 2015-10-07 ENCOUNTER — Other Ambulatory Visit: Payer: Self-pay | Admitting: Cardiology

## 2015-10-07 ENCOUNTER — Other Ambulatory Visit: Payer: Self-pay | Admitting: Internal Medicine

## 2015-10-08 ENCOUNTER — Other Ambulatory Visit: Payer: Self-pay | Admitting: Internal Medicine

## 2015-10-13 NOTE — Telephone Encounter (Signed)
Called to pharm 

## 2015-10-20 ENCOUNTER — Other Ambulatory Visit: Payer: Self-pay | Admitting: Internal Medicine

## 2015-10-20 ENCOUNTER — Ambulatory Visit (INDEPENDENT_AMBULATORY_CARE_PROVIDER_SITE_OTHER): Payer: Medicare Other | Admitting: Internal Medicine

## 2015-10-20 DIAGNOSIS — R0600 Dyspnea, unspecified: Secondary | ICD-10-CM | POA: Diagnosis not present

## 2015-10-20 LAB — PULMONARY FUNCTION TEST
DL/VA % pred: 98 %
DL/VA: 4.55 ml/min/mmHg/L
DLCO COR: 24.16 ml/min/mmHg
DLCO cor % pred: 73 %
DLCO unc % pred: 71 %
DLCO unc: 23.59 ml/min/mmHg
FEF 25-75 Post: 2.93 L/sec
FEF 25-75 Pre: 2.37 L/sec
FEF2575-%Change-Post: 23 %
FEF2575-%PRED-PRE: 92 %
FEF2575-%Pred-Post: 114 %
FEV1-%CHANGE-POST: 4 %
FEV1-%PRED-PRE: 94 %
FEV1-%Pred-Post: 98 %
FEV1-POST: 2.94 L
FEV1-Pre: 2.81 L
FEV1FVC-%Change-Post: 2 %
FEV1FVC-%Pred-Pre: 100 %
FEV6-%Change-Post: 1 %
FEV6-%PRED-POST: 98 %
FEV6-%Pred-Pre: 96 %
FEV6-POST: 3.69 L
FEV6-PRE: 3.63 L
FEV6FVC-%PRED-POST: 105 %
FEV6FVC-%PRED-PRE: 105 %
FVC-%CHANGE-POST: 1 %
FVC-%PRED-POST: 93 %
FVC-%PRED-PRE: 92 %
FVC-PRE: 3.63 L
FVC-Post: 3.69 L
POST FEV1/FVC RATIO: 80 %
POST FEV6/FVC RATIO: 100 %
Pre FEV1/FVC ratio: 77 %
Pre FEV6/FVC Ratio: 100 %
RV % pred: 97 %
RV: 2.4 L
TLC % PRED: 86 %
TLC: 6.15 L

## 2015-10-20 NOTE — Progress Notes (Signed)
PFT done today. 

## 2015-10-25 ENCOUNTER — Ambulatory Visit (INDEPENDENT_AMBULATORY_CARE_PROVIDER_SITE_OTHER): Payer: Medicare HMO | Admitting: Internal Medicine

## 2015-10-25 ENCOUNTER — Encounter: Payer: Self-pay | Admitting: Internal Medicine

## 2015-10-25 VITALS — BP 142/90 | HR 61 | Ht 69.0 in | Wt 200.0 lb

## 2015-10-25 DIAGNOSIS — I472 Ventricular tachycardia, unspecified: Secondary | ICD-10-CM

## 2015-10-25 DIAGNOSIS — I482 Chronic atrial fibrillation, unspecified: Secondary | ICD-10-CM

## 2015-10-25 DIAGNOSIS — R0602 Shortness of breath: Secondary | ICD-10-CM

## 2015-10-25 DIAGNOSIS — I5022 Chronic systolic (congestive) heart failure: Secondary | ICD-10-CM

## 2015-10-25 DIAGNOSIS — I480 Paroxysmal atrial fibrillation: Secondary | ICD-10-CM

## 2015-10-25 MED ORDER — APIXABAN 2.5 MG PO TABS: 2.5000 mg | ORAL_TABLET | Freq: Two times a day (BID) | ORAL | Status: DC

## 2015-10-25 MED ORDER — AMIODARONE HCL 200 MG PO TABS: 200.0000 mg | ORAL_TABLET | Freq: Every day | ORAL | Status: DC

## 2015-10-25 MED ORDER — METOPROLOL SUCCINATE ER 100 MG PO TB24: ORAL_TABLET | ORAL | Status: DC

## 2015-10-25 NOTE — Patient Instructions (Signed)
Medication Instructions:  Your physician has recommended you make the following change in your medication:  1) Increase Metoprolol to 100 mg in the morning and 50 mg in the pm   Labwork: None ordered   Testing/Procedures: Your physician has recommended that you have a cardiopulmonary stress test (CPX). CPX testing is a non-invasive measurement of heart and lung function. It replaces a traditional treadmill stress test. This type of test provides a tremendous amount of information that relates not only to your present condition but also for future outcomes. This test combines measurements of you ventilation, respiratory gas exchange in the lungs, electrocardiogram (EKG), blood pressure and physical response before, during, and following an exercise protocol.    Follow-Up: Your physician recommends that you schedule a follow-up appointment in: 3 months with Dr Rayann Heman  You have been referred to Dr Bensimhon/[facility]    Any Other Special Instructions Will Be Listed Below (If Applicable).     If you need a refill on your cardiac medications before your next appointment, please call your pharmacy.

## 2015-10-25 NOTE — Progress Notes (Signed)
Electrophysiology Office Note   Date:  10/25/2015   ID:  [name], DOB 23-Jun-1945, MRN [id number]  PCP:  [name], MD   Primary Electrophysiologist: [name], MD    Chief Complaint  Patient presents with  . Atrial Fibrillation     History of Present Illness: [name] is a 70 y.o. male who presents today for electrophysiology evaluation.   The patient recent moved back from [place] to [facility] (Dr [employer] note reviewed).  I saw him last in April at which time we reduced his amiodarone and increased toprol. He had VT May 10th but has done well since.  He has frequent SOB with moderate activity.  He remains active.  Today, he denies symptoms of palpitations, chest pain, orthopnea, PND, lower extremity edema, claudication, dizziness, presyncope, syncope, bleeding, or neurologic sequela. The patient is tolerating medications without difficulties and is otherwise without complaint today.    Past Medical History  Diagnosis Date  . Ischemic cardiomyopathy     EF 35-45% in 2009  . Myocardial infarction ([place])     in  1986 with pci  to circumflex  . Atrial fibrillation ([place])     on pradaxa for stroke prevention  . Hypertension   . Stroke ([place])     in 2009, left fronto-temporal, due to a-fib  . Chronic systolic dysfunction of left ventricle   . GERD (gastroesophageal reflux disease)   . HLD (hyperlipidemia)   . Ventricular tachycardia ([place])     prior VT storm treated with amiodarone, followed by Dr. Rayann Heman, dual chamber defibrillator  . Obstructive sleep apnea     sees Dr. Don Broach  . DJD (degenerative joint disease)     of shoulder,  . CKD (chronic kidney disease), stage III 02/22/2011   Past Surgical History  Procedure Laterality Date  . Vocal cord surgery    . Icd implantation    . Hernia repair    . Angioplasty       Current Outpatient Prescriptions  Medication Sig Dispense Refill  . amiodarone (PACERONE) 200 MG tablet TAKE 2 TABLETS BY MOUTH  DAILY 60 tablet 11  . apixaban (ELIQUIS) 2.5 MG TABS tablet Take 1 tablet (2.5 mg total) by mouth 2 (two) times daily. 60 tablet 0  . Blood Pressure Monitoring (BLOOD PRESSURE CUFF) MISC 1 each by Does not apply route daily. 1 each 0  . fenofibrate 160 MG tablet Take 1 tablet (160 mg total) by mouth daily. 90 tablet 4  . finasteride (PROSCAR) 5 MG tablet Take 1 tablet (5 mg total) by mouth daily. 30 tablet 2  . isosorbide mononitrate (IMDUR) 30 MG 24 hr tablet Take 15 mg by mouth daily.     Marland Kitchen levocetirizine (XYZAL) 5 MG tablet Take 1 tablet (5 mg total) by mouth every evening. 30 tablet 2  . metoprolol succinate (TOPROL-XL) 100 MG 24 hr tablet Take 1 tablet (100 mg total) by mouth daily. 90 tablet 3  . mirabegron ER (MYRBETRIQ) 25 MG TB24 tablet Take 25 mg by mouth daily.    . nitroGLYCERIN (NITROSTAT) 0.4 MG SL tablet Place 0.4 mg under the tongue every 5 (five) minutes x 3 doses as needed for chest pain.    . rosuvastatin (CRESTOR) 40 MG tablet take 1 tablet by mouth once daily 90 tablet 2  . tamsulosin (FLOMAX) 0.4 MG CAPS capsule take 1 capsule by mouth once daily 30 capsule 2  . VIMPAT 50 MG TABS tablet take 1 tablet by mouth twice a  day 180 tablet 2   No current facility-administered medications for this visit.    Allergies:   Aricept; Codeine; Nexium; Omeprazole; Pantoprazole sodium; and Tramadol   Social History:  The patient  reports that he has quit smoking. He has never used smokeless tobacco. He reports that he does not drink alcohol or use illicit drugs.   Family History:  The patient's family history includes Diabetes in his sister; Heart attack in his father; Heart disease in his father; Hyperlipidemia in his father; Hypertension in his father. There is no history of Sudden death.    ROS:  Please see the history of present illness.   All other systems are reviewed and negative.    PHYSICAL EXAM: VS:  BP 142/90 mmHg  Pulse 61  Ht 5\' 9"  (1.753 m)  Wt 200 lb (90.719 kg)   BMI 29.52 kg/m2 , BMI Body mass index is 29.52 kg/(m^2). GEN: Well nourished, well developed, in no acute distress HEENT: normal Neck: no JVD, carotid bruits, or masses Cardiac: RRR; no murmurs, rubs, or gallops,no edema  Respiratory:  clear to auscultation bilaterally, normal work of breathing GI: soft, nontender, nondistended, + BS MS: no deformity or atrophy Skin: warm and dry, device pocket is well healed Neuro:  Strength and sensation are intact Psych: euthymic mood, full affect  EKG:  EKG is ordered today. The ekg ordered today shows sinus rhythm with incomplete LBBB (QRS 142 msec)  Device interrogation is reviewed today in detail.  See PaceArt for details.   Recent Labs: 07/19/2015: ALT 25; BUN 13; Creat 1.72*; Potassium 4.1; Sodium 140; TSH 3.18    Lipid Panel     Component Value Date/Time   CHOL 137 08/05/2015 1108   CHOL 181 12/19/2010 1130   TRIG 95 08/05/2015 1108   HDL 32* 08/05/2015 1108   HDL 40 12/19/2010 1130   CHOLHDL 4.3 08/05/2015 1108   CHOLHDL 4.5 12/19/2010 1130   VLDL 19 12/19/2010 1130   LDLCALC 86 08/05/2015 1108   LDLCALC 122* 12/19/2010 1130     Wt Readings from Last 3 Encounters:  10/25/15 200 lb (90.719 kg)  09/06/15 197 lb 6.4 oz (89.54 kg)  09/06/15 199 lb 3.2 oz (90.357 kg)    Recent PFTs are reviewed  ASSESSMENT AND PLAN:  1.  VT Therapeutic strategies for ventricular tachycardia including medicine and ablation were discussed in detail with the patient today. At this time, he is clear that he would prefer medical therapy.  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  Continue amiodarone to 200mg  daily Increase toprol XL to 100mg  daily, 50mg  qpm Will reduce VT zone due to tachycardia falling below zone (130 bpm) and make ATP more aggressive today. May consider ablation if episodes continue No driving x 6 months (pt aware)  2. Ischemic CM/ chronic systolic dysfunction Echo reviewed, PFTs reviewed Given SOB with  activity, will obtain CPX Refer to advanced heart failure team for long term management and medicine optimization  3. Chronic renal failure Followed by nephrology  4. HTN Stable No change required today  5. Afib Continue on eliquis   Follow-up: return to see me in 3 months [place]  Current medicines are reviewed at length with the patient today.   The patient does not have concerns regarding his medicines.  The following changes were made today:  none  Labs/ tests ordered today include:  No orders of the defined types were placed in this encounter.  Army Fossa, MD  10/25/2015 9:48 AM     [place] [address] [phone] (office) [phone] (fax)

## 2015-10-26 ENCOUNTER — Telehealth (HOSPITAL_COMMUNITY): Payer: Self-pay | Admitting: Surgery

## 2015-10-27 LAB — CUP PACEART INCLINIC DEVICE CHECK
HIGH POWER IMPEDANCE MEASURED VALUE: 49 Ohm
Implantable Lead Implant Date: 20050922
Implantable Lead Location: 753860
Implantable Lead Model: 158
Lead Channel Pacing Threshold Pulse Width: 0.4 ms
Lead Channel Sensing Intrinsic Amplitude: 23.6 mV
Lead Channel Sensing Intrinsic Amplitude: 4 mV
Lead Channel Setting Pacing Amplitude: 2.4 V
MDC IDC LEAD IMPLANT DT: 20050922
MDC IDC LEAD LOCATION: 753859
MDC IDC LEAD SERIAL: 156891
MDC IDC MSMT LEADCHNL RA PACING THRESHOLD AMPLITUDE: 0.5 V
MDC IDC MSMT LEADCHNL RV PACING THRESHOLD AMPLITUDE: 1.1 V
MDC IDC MSMT LEADCHNL RV PACING THRESHOLD PULSEWIDTH: 0.4 ms
MDC IDC SESS DTM: 20170605040000
MDC IDC SET LEADCHNL RA PACING AMPLITUDE: 2 V
MDC IDC SET LEADCHNL RV PACING PULSEWIDTH: 0.4 ms
MDC IDC SET LEADCHNL RV SENSING SENSITIVITY: 0.6 mV
Pulse Gen Serial Number: 141895

## 2015-11-02 ENCOUNTER — Telehealth: Payer: Self-pay

## 2015-11-03 ENCOUNTER — Ambulatory Visit (HOSPITAL_COMMUNITY): Payer: Medicare HMO | Attending: Internal Medicine

## 2015-11-03 DIAGNOSIS — R0602 Shortness of breath: Secondary | ICD-10-CM | POA: Insufficient documentation

## 2015-11-05 NOTE — Telephone Encounter (Signed)
[name] is a 70 y.o. male who was contacted via telephone for monitoring of apixaban (Eliquis) therapy.    ASSESSMENT Indication(s): atrial fibrillation Duration: indefinite  Labs:    Component Value Date/Time   AST 30 07/19/2015 1551   ALT 25 07/19/2015 1551   NA 140 07/19/2015 1551   K 4.1 07/19/2015 1551   CL 104 07/19/2015 1551   CO2 26 07/19/2015 1551   GLUCOSE 74 07/19/2015 1551   BUN 13 07/19/2015 1551   CREATININE 1.72* 07/19/2015 1551   CREATININE 1.7* 04/20/2011 1208   CALCIUM 9.9 07/19/2015 1551   GFRNONAA 32* 02/25/2011 0745   GFRAA 37* 02/25/2011 0745   WBC 7.4 04/20/2011 1208   HGB 13.5 04/20/2011 1208   HCT 39.9 04/20/2011 1208   PLT 299.0 04/20/2011 1208    apixaban (Eliquis) Dose: 2.5 mg BID   Safety: Patient has not had recent bleeding/thromboembolic events. Patient reports no recent signs or symptoms of bleeding, no signs of symptoms of thromboembolism. Medication changes: yes.  Adherence: Patient reports no known adherence challenges. Talked to patient's wife and and she stated that the cost of all his medications is an issue and would really appreciate help. Patient does correctly recite the dose. Contacted pharmacy and records indicate refills are consistent. Last fills 08/03/2015 (3 month supply), 10/26/2015.   Patient Instructions: Patient advised to contact clinic or seek medical attention if signs/symptoms of bleeding or thromboembolism occur. Patient verbalized understanding by repeating back information.  Follow-up Next appointment 11/24/2015 for follow-up.   Angelena Form PharmD Candidate  11/05/2015, 10:27 AM

## 2015-11-08 ENCOUNTER — Telehealth: Payer: Self-pay | Admitting: Internal Medicine

## 2015-11-08 NOTE — Telephone Encounter (Signed)
Mrs. Ceresa is calling because [name] had a pulmonary function test last week and after that he has no energy and has been sleeping a lot more and more shortness of breath . Plus his medication was changed . Please call   Thanks

## 2015-11-08 NOTE — Telephone Encounter (Signed)
CPX results: Conclusion: The interpretation of this test is limited due to a significantsubmaximal effort during the exercise. There is no evidence of ventilatory limitation. There may be a very mild circulatory limitation. The BP and HR response were blunted in the setting of a submaximal effort. Suspect deconditioning may be the major factor in his dyspnea.  His Metoprolol was increased by 50 mg.  He is take 100mg  in the am and 50 mg in the pm.

## 2015-11-09 NOTE — Telephone Encounter (Signed)
Spoke with patient's wife and gave her test results.  I have asked that she not make any changes and try to continue medications as directed.  He has a new CHF appointment in the next few weeks.  I did explain to her his decrease in EF and this could be contributing to his not being able to do as much as he did in the past.

## 2015-11-09 NOTE — Telephone Encounter (Signed)
Patient was contacted  by Hailey Hill, PharmD candidate. I agree with the assessment and plan of care documented. 

## 2015-11-11 ENCOUNTER — Ambulatory Visit: Payer: Medicare HMO | Admitting: Pharmacist

## 2015-11-11 DIAGNOSIS — Z596 Low income: Secondary | ICD-10-CM

## 2015-11-11 DIAGNOSIS — Z79899 Other long term (current) drug therapy: Secondary | ICD-10-CM

## 2015-11-12 NOTE — Progress Notes (Signed)
Patient was seen in clinic by Karlyn Agee, PharmD candidate. Helping patient with access to apixaban.

## 2015-11-15 ENCOUNTER — Telehealth: Payer: Self-pay | Admitting: Internal Medicine

## 2015-11-15 NOTE — Telephone Encounter (Signed)
Discussed with Dr Rayann Heman and he said can decrease his Metoprolol back to his previous dose of 100 mg daily to see if this helps with his fatigue.  Needs to keep his appointment with CHF-Dr Bensimhon for 11/26/15/  I have asked her to call me back with any questions

## 2015-11-15 NOTE — Telephone Encounter (Signed)
[name] ( Wife) is calling because the side effects from the Metoprolol are still there . Shortness of breath , fatique and a rash . Want to know if there is something else that he can take . Please call   Thanks

## 2015-11-24 ENCOUNTER — Encounter: Payer: Medicare Other | Admitting: Internal Medicine

## 2015-11-26 ENCOUNTER — Encounter (HOSPITAL_COMMUNITY): Payer: Self-pay | Admitting: Internal Medicine

## 2015-11-26 ENCOUNTER — Telehealth (HOSPITAL_COMMUNITY): Payer: Self-pay | Admitting: Vascular Surgery

## 2015-11-26 ENCOUNTER — Ambulatory Visit (HOSPITAL_COMMUNITY)
Admission: RE | Admit: 2015-11-26 | Discharge: 2015-11-26 | Disposition: A | Discharge status: Home / Self Care | Payer: Medicare HMO | Source: Ambulatory Visit | Attending: Internal Medicine | Admitting: Internal Medicine

## 2015-11-26 VITALS — BP 144/86 | HR 55 | Wt 200.5 lb

## 2015-11-26 DIAGNOSIS — Z8673 Personal history of transient ischemic attack (TIA), and cerebral infarction without residual deficits: Secondary | ICD-10-CM | POA: Insufficient documentation

## 2015-11-26 DIAGNOSIS — Z95810 Presence of automatic (implantable) cardiac defibrillator: Secondary | ICD-10-CM | POA: Diagnosis not present

## 2015-11-26 DIAGNOSIS — K219 Gastro-esophageal reflux disease without esophagitis: Secondary | ICD-10-CM | POA: Diagnosis not present

## 2015-11-26 DIAGNOSIS — G4733 Obstructive sleep apnea (adult) (pediatric): Secondary | ICD-10-CM | POA: Diagnosis not present

## 2015-11-26 DIAGNOSIS — Z888 Allergy status to other drugs, medicaments and biological substances status: Secondary | ICD-10-CM | POA: Insufficient documentation

## 2015-11-26 DIAGNOSIS — Z8249 Family history of ischemic heart disease and other diseases of the circulatory system: Secondary | ICD-10-CM | POA: Diagnosis not present

## 2015-11-26 DIAGNOSIS — R079 Chest pain, unspecified: Secondary | ICD-10-CM

## 2015-11-26 DIAGNOSIS — I130 Hypertensive heart and chronic kidney disease with heart failure and stage 1 through stage 4 chronic kidney disease, or unspecified chronic kidney disease: Secondary | ICD-10-CM | POA: Diagnosis not present

## 2015-11-26 DIAGNOSIS — N183 Chronic kidney disease, stage 3 (moderate): Secondary | ICD-10-CM | POA: Diagnosis not present

## 2015-11-26 DIAGNOSIS — Z87891 Personal history of nicotine dependence: Secondary | ICD-10-CM | POA: Diagnosis not present

## 2015-11-26 DIAGNOSIS — I480 Paroxysmal atrial fibrillation: Secondary | ICD-10-CM | POA: Insufficient documentation

## 2015-11-26 DIAGNOSIS — I4891 Unspecified atrial fibrillation: Secondary | ICD-10-CM | POA: Diagnosis not present

## 2015-11-26 DIAGNOSIS — Z885 Allergy status to narcotic agent status: Secondary | ICD-10-CM | POA: Insufficient documentation

## 2015-11-26 DIAGNOSIS — I252 Old myocardial infarction: Secondary | ICD-10-CM | POA: Insufficient documentation

## 2015-11-26 DIAGNOSIS — E785 Hyperlipidemia, unspecified: Secondary | ICD-10-CM | POA: Insufficient documentation

## 2015-11-26 DIAGNOSIS — I2510 Atherosclerotic heart disease of native coronary artery without angina pectoris: Secondary | ICD-10-CM | POA: Diagnosis not present

## 2015-11-26 DIAGNOSIS — Z7901 Long term (current) use of anticoagulants: Secondary | ICD-10-CM | POA: Diagnosis not present

## 2015-11-26 DIAGNOSIS — I255 Ischemic cardiomyopathy: Secondary | ICD-10-CM | POA: Diagnosis not present

## 2015-11-26 DIAGNOSIS — Z79899 Other long term (current) drug therapy: Secondary | ICD-10-CM | POA: Diagnosis not present

## 2015-11-26 DIAGNOSIS — I5022 Chronic systolic (congestive) heart failure: Secondary | ICD-10-CM | POA: Diagnosis not present

## 2015-11-26 DIAGNOSIS — Z833 Family history of diabetes mellitus: Secondary | ICD-10-CM | POA: Diagnosis not present

## 2015-11-26 MED ORDER — FUROSEMIDE 40 MG PO TABS: 40.0000 mg | ORAL_TABLET | ORAL | Status: DC

## 2015-11-26 MED ORDER — ISOSORB DINITRATE-HYDRALAZINE 20-37.5 MG PO TABS: 0.5000 | ORAL_TABLET | Freq: Three times a day (TID) | ORAL | Status: DC

## 2015-11-26 NOTE — Progress Notes (Signed)
[name], male   DOB: Jun 03, 1945, 70 y.o.   MRN: [id number]   ADVANCED HF CLINIC CONSULT NOTE  Referring Physician: Dr Rayann Heman  Primary Care: Dr Charlynn Grimes Primary Cardiologist: EP: Dr Rayann Heman  Nephrologist: Dr Posey Pronto   HPI: [name] is a  70 year old with a history of ICM, chronic systolic heart failure EF 25-30% s/p ICD, VT 2016, CVA 2009, OSA, DJD, PAF  and CKD III-IV.    Moved to [place] in 2013 for nephrology care.Was apparently told to stop Naprosyn and renal function improved. Relocated to [place] late December 2017.   Today he is referred to the HF clinic by Dr Rayann Heman for heart failure ongoing HF management.  Complaining of increased fatigue and dyspnea on exertion. + PND, +orthopnea. Had episode of chest pain a couple of months ago that was relieved with nitroglycerin. Using CPAP at night. Not very active during the day.Has not been taking a diuretic.   ECHO 08/2015 EF 25-30%. Left ventricle: Septal , apical and inferior wall hypokinesis The  cavity size was moderately dilated. Wall thickness was increased  in a pattern of mild LVH. Systolic function was severely reduced.  The estimated ejection fraction was in the range of 25% to 30%. - Mitral valve: Eccentric posteriorly directed MR likely ischemic.  There was mild to moderate regurgitation. - Left atrium: The atrium was moderately dilated. - Atrial septum: No defect or patent foramen ovale was identified. - Pulmonary arteries: PA peak pressure: 32 mm Hg (S).  ECHO 2009 EF 35-40%  CPX 10/2015-Submaximal  FVC 3.40 (97%)    FEV1 2.61 (97%)     FEV1/FVC 77 (99%)     MVV 79 (63%) \Peak VO2: 19.3 (77% predicted peak VO2) VE/VCO2 slope: 31.6 OUES: 1.67 Peak RER: 0.97 VE/MVV: 58%  Review of Systems: [y] = yes, [ ]  = no   General: Weight gain [ ] ; Weight loss [ ] ; Anorexia [ ] ; Fatigue [Y ]; Fever [ ] ; Chills [ ] ; Weakness [ Y]  Cardiac: Chest pain/pressure [ ] ; Resting SOB [ ] ; Exertional SOB [Y ];  Orthopnea [ ] ; Pedal Edema [ ] ; Palpitations [ ] ; Syncope [ ] ; Presyncope [ ] ; Paroxysmal nocturnal dyspnea[ Y]  Pulmonary: Cough [ ] ; Wheezing[ ] ; Hemoptysis[ ] ; Sputum [ ] ; Snoring [ ]   GI: Vomiting[ ] ; Dysphagia[ ] ; Melena[ ] ; Hematochezia [ ] ; Heartburn[ ] ; Abdominal pain [ ] ; Constipation [ ] ; Diarrhea [ ] ; BRBPR [ ]   GU: Hematuria[ ] ; Dysuria [ ] ; Nocturia[ ]   Vascular: Pain in legs with walking [ ] ; Pain in feet with lying flat [ ] ; Non-healing sores [ ] ; Stroke [ ] ; TIA [ ] ; Slurred speech [ ] ;  Neuro: Headaches[ ] ; Vertigo[ ] ; Seizures[ ] ; Paresthesias[ ] ;Blurred vision [ ] ; Diplopia [ ] ; Vision changes [ ]   Ortho/Skin: Arthritis [ ] ; Joint pain [ Y]; Muscle pain [ ] ; Joint swelling [ ] ; Back Pain [ ] ; Rash [ ]   Psych: Depression[ ] ; Anxiety[ ]   Heme: Bleeding problems [ ] ; Clotting disorders [ ] ; Anemia [ ]   Endocrine: Diabetes [ ] ; Thyroid dysfunction[ ]    Past Medical History  Diagnosis Date  . Ischemic cardiomyopathy     EF 35-45% in 2009  . Myocardial infarction ([place])     in  1986 with pci  to circumflex  . Atrial fibrillation ([place])     on pradaxa for stroke prevention  . Hypertension   . Stroke [facility])     in 2009, left fronto-temporal, due  to a-fib  . Chronic systolic dysfunction of left ventricle   . GERD (gastroesophageal reflux disease)   . HLD (hyperlipidemia)   . Ventricular tachycardia ([place])     prior VT storm treated with amiodarone, followed by Dr. Rayann Heman, dual chamber defibrillator  . Obstructive sleep apnea     sees Dr. Don Broach  . DJD (degenerative joint disease)     of shoulder,  . CKD (chronic kidney disease), stage III 02/22/2011    Current Outpatient Prescriptions  Medication Sig Dispense Refill  . amiodarone (PACERONE) 200 MG tablet Take 1 tablet (200 mg total) by mouth daily. 90 tablet 3  . apixaban (ELIQUIS) 2.5 MG TABS tablet Take 1 tablet (2.5 mg total) by mouth 2 (two) times daily. 180 tablet 3  . Blood Pressure Monitoring (BLOOD PRESSURE  CUFF) MISC 1 each by Does not apply route daily. 1 each 0  . fenofibrate 160 MG tablet Take 1 tablet (160 mg total) by mouth daily. 90 tablet 4  . finasteride (PROSCAR) 5 MG tablet Take 1 tablet (5 mg total) by mouth daily. 30 tablet 2  . isosorbide mononitrate (IMDUR) 30 MG 24 hr tablet Take 15 mg by mouth daily.     Marland Kitchen levocetirizine (XYZAL) 5 MG tablet Take 1 tablet (5 mg total) by mouth every evening. 30 tablet 2  . metoprolol succinate (TOPROL-XL) 100 MG 24 hr tablet Take 100mg (1 tablet) by mouth in the am and 50mg (1/2 tablet) by mouth in the pm 135 tablet 3  . mirabegron ER (MYRBETRIQ) 25 MG TB24 tablet Take 25 mg by mouth daily.    . nitroGLYCERIN (NITROSTAT) 0.4 MG SL tablet Place 0.4 mg under the tongue every 5 (five) minutes x 3 doses as needed for chest pain.    . rosuvastatin (CRESTOR) 40 MG tablet take 1 tablet by mouth once daily 90 tablet 2  . tamsulosin (FLOMAX) 0.4 MG CAPS capsule take 1 capsule by mouth once daily 30 capsule 2  . VIMPAT 50 MG TABS tablet take 1 tablet by mouth twice a day 180 tablet 2   No current facility-administered medications for this encounter.    Allergies  Allergen Reactions  . Aricept [Donepezil Hcl]     Worsens renal function  . Codeine     unknown  . Nexium [Esomeprazole]     Severely worsens renal function  . Omeprazole Hives  . Pantoprazole Sodium     REACTION: Reaction unknown  . Tramadol Nausea And Vomiting    Reaction: nausea      Social History   Social History  . Marital Status: Married    Spouse Name: N/A  . Number of Children: N/A  . Years of Education: N/A   Occupational History  . Retired    Social History Main Topics  . Smoking status: Former Research scientist (life sciences)  . Smokeless tobacco: Never Used  . Alcohol Use: No  . Drug Use: No  . Sexual Activity: Not on file   Other Topics Concern  . Not on file   Social History Narrative   ICD-[employer] Remote- Yes   Financial Assistance:  Application initiated.  Patient  needs to submit further paperwork to complete per Bonna Gains 02/18/2010.   Financial Assistance: approved for 100% discount after Medicare pays for [facility] only, not eligible for [facility] card per Bonna Gains 04/29/10.               Family History  Problem Relation Age of Onset  . Heart disease Father   .  Hyperlipidemia Father   . Hypertension Father   . Heart attack Father   . Diabetes Sister   . Sudden death Neg Hx     Filed Vitals:   11/26/15 1209  BP: 144/86  Pulse: 55  Weight: 200 lb 8 oz (90.946 kg)  SpO2: 98%    PHYSICAL EXAM: General:  Well appearing. No respiratory difficulty. Wife present.  HEENT: normal Neck: supple. JVD 9-10. Carotids 2+ bilat; no bruits. No lymphadenopathy or thryomegaly appreciated. Cor: PMI nondisplaced. Regular rate & rhythm. No rubs, gallops or murmurs. Lungs: clear Abdomen: obese soft, nontender, nondistended. No hepatosplenomegaly. No bruits or masses. Good bowel sounds. Extremities: no cyanosis, clubbing, rash, edema Neuro: alert & oriented x 3, cranial nerves grossly intact. moves all 4 extremities w/o difficulty. Affect pleasant.  ECG: Atrial Paced 55 bpm LBBB   ASSESSMENT & PLAN:  1. Chronic Systolic Heart Failure. ECHO 08/2015. EF 25-30%.  CPX test reviewed and discussed. NYHA III. Volume status mildly elevated.  Cut back Toprol XL to 50 mg twice a day. Add 40 mg po lasix daily twice a week.  No ace/sprio/dig with ckd. Stop imdur. Add 1/2 tab bidil three times a day.   2. CAD- last cath 2005 patent cors. He has not had stress test since 2014. Set up stress test.    3. PAF- on amio 200 mg daily and eliqus 2.5 mg twice a day  4. CKD III- Followed by Dr Shirley Friar, NP-C  Patient seen and examined with Darrick Grinder, NP. We discussed all aspects of the encounter. I agree with the assessment and plan as stated above.   Patient with long h/o systolic HF due to iCM. EF now 25-30%. Also has CKD but renal function recently  improved. Present with NYHA III-IIIB symptoms but CPX reviewed and actually suggests more NYHA II-III symptoms. Appears to have mild volume overload. Will start lasix 40 mg 2x/week and watch renal function closely. Will also start Bidil 1/2 tab BID. No ACE/ARB/ARNI yet with CKD may consider at later date. Last cath was in 2005. Needs further ischemic eval. Start with lexiscan myoview.  Long discussion about his condition and criteria to begin thinking about advanced therapies (not there yet).    Total time spent 45 minutes. Over half that time spent discussing above.   Suzzanne Brunkhorst,MD 10:00 PM

## 2015-11-26 NOTE — Telephone Encounter (Signed)
Sent [employer] to call pt to schedule stress Myoview

## 2015-11-26 NOTE — Patient Instructions (Signed)
Stop Isosorbide (Imdur)  Start Bidil 1/2 tab Three times a day   Start Furosemide (Lasix) 40 mg twice a week  Labs in 10 days  Your physician has requested that you have a lexiscan myoview. For further information please visit [email or web address]. Please follow instruction sheet, as given.  Your physician recommends that you schedule a follow-up appointment in: 1 month

## 2015-11-29 ENCOUNTER — Telehealth (HOSPITAL_COMMUNITY): Payer: Self-pay | Admitting: *Deleted

## 2015-11-29 NOTE — Telephone Encounter (Signed)
Patien's daugther per DPRt given detailed instructions per Myocardial Perfusion Study Information Sheet for the test on 12/01/15. Patient notified to arrive 15 minutes early and that it is imperative to arrive on time for appointment to keep from having the test rescheduled.  If you need to cancel or reschedule your appointment, please call the office within 24 hours of your appointment. Failure to do so may result in a cancellation of your appointment, and a $50 no show fee. Patient verbalized understanding. Hubbard Robinson, RN

## 2015-11-30 NOTE — Telephone Encounter (Signed)
Refill encounter doc

## 2015-12-01 ENCOUNTER — Ambulatory Visit (HOSPITAL_COMMUNITY): Payer: Medicare HMO | Attending: Cardiology

## 2015-12-01 DIAGNOSIS — R079 Chest pain, unspecified: Secondary | ICD-10-CM | POA: Diagnosis present

## 2015-12-01 DIAGNOSIS — I119 Hypertensive heart disease without heart failure: Secondary | ICD-10-CM | POA: Diagnosis not present

## 2015-12-01 DIAGNOSIS — R002 Palpitations: Secondary | ICD-10-CM | POA: Diagnosis not present

## 2015-12-01 DIAGNOSIS — R9439 Abnormal result of other cardiovascular function study: Secondary | ICD-10-CM | POA: Insufficient documentation

## 2015-12-01 DIAGNOSIS — R0602 Shortness of breath: Secondary | ICD-10-CM | POA: Insufficient documentation

## 2015-12-01 DIAGNOSIS — R0609 Other forms of dyspnea: Secondary | ICD-10-CM | POA: Diagnosis not present

## 2015-12-01 DIAGNOSIS — R0789 Other chest pain: Secondary | ICD-10-CM | POA: Diagnosis not present

## 2015-12-01 DIAGNOSIS — R5383 Other fatigue: Secondary | ICD-10-CM | POA: Insufficient documentation

## 2015-12-01 LAB — MYOCARDIAL PERFUSION IMAGING
CHL CUP NUCLEAR SRS: 26
CHL CUP NUCLEAR SSS: 27
CHL CUP RESTING HR STRESS: 55 {beats}/min
CSEPPHR: 71 {beats}/min
LVDIAVOL: 234 mL (ref 62–150)
LVSYSVOL: 167 mL
NUC STRESS TID: 1.06
RATE: 0.35
SDS: 1

## 2015-12-01 MED ORDER — REGADENOSON 0.4 MG/5ML IV SOLN
0.4000 mg | Freq: Once | INTRAVENOUS | Status: AC
  Administered 2015-12-01: 0.4 mg via INTRAVENOUS

## 2015-12-01 MED ORDER — TECHNETIUM TC 99M TETROFOSMIN IV KIT
10.7000 | PACK | Freq: Once | INTRAVENOUS | Status: AC | PRN
  Administered 2015-12-01: 11 via INTRAVENOUS
  Filled 2015-12-01: qty 11

## 2015-12-01 MED ORDER — TECHNETIUM TC 99M TETROFOSMIN IV KIT
32.8000 | PACK | Freq: Once | INTRAVENOUS | Status: AC | PRN
  Administered 2015-12-01: 32.8 via INTRAVENOUS
  Filled 2015-12-01: qty 33

## 2015-12-02 ENCOUNTER — Ambulatory Visit (INDEPENDENT_AMBULATORY_CARE_PROVIDER_SITE_OTHER): Payer: Medicare HMO | Admitting: Adult Health

## 2015-12-02 ENCOUNTER — Encounter: Payer: Self-pay | Admitting: Adult Health

## 2015-12-02 VITALS — BP 110/60 | Temp 98.4°F | Ht 69.0 in | Wt 201.9 lb

## 2015-12-02 DIAGNOSIS — M6281 Muscle weakness (generalized): Secondary | ICD-10-CM | POA: Diagnosis not present

## 2015-12-02 DIAGNOSIS — Z7189 Other specified counseling: Secondary | ICD-10-CM

## 2015-12-02 DIAGNOSIS — Z7689 Persons encountering health services in other specified circumstances: Secondary | ICD-10-CM

## 2015-12-02 DIAGNOSIS — R351 Nocturia: Secondary | ICD-10-CM | POA: Diagnosis not present

## 2015-12-02 DIAGNOSIS — R269 Unspecified abnormalities of gait and mobility: Secondary | ICD-10-CM

## 2015-12-02 DIAGNOSIS — I69398 Other sequelae of cerebral infarction: Secondary | ICD-10-CM

## 2015-12-02 MED ORDER — MIRABEGRON ER 50 MG PO TB24: 50.0000 mg | ORAL_TABLET | Freq: Every day | ORAL | Status: DC

## 2015-12-02 NOTE — Progress Notes (Signed)
Patient presents to clinic today to establish care. He is a pleasant 70 year old male who  has a past medical history of Ischemic cardiomyopathy; Myocardial infarction ([place]); Atrial fibrillation ([place]); Hypertension; Stroke [facility]); Chronic systolic dysfunction of left ventricle; GERD (gastroesophageal reflux disease); HLD (hyperlipidemia); Ventricular tachycardia ([place]); Obstructive sleep apnea; DJD (degenerative joint disease); and CKD (chronic kidney disease), stage III (02/22/2011).  He presents with his wife to this visit.   Acute Concerns: Establish Care  Left hand tightness - He had tendon repair of the left hand a few years ago. Since that time here feels as though " it is too tight". He has difficulty making a fist or moving his arm. Due to the feeling of tightness. His wife endorses that he was doing PT in [place] before they moved back down here and that it was helping. They would like to go back to see PT.    Chronic Issues: Stroke - 2009. Continues to have some residual deficits with gait and memory. He is not using a walking or cane at home. His wife is worried about him falling. When it comes to his memory she feels as though he often forgets things, especially when he goes to the grocery store. He is not getting lost while driving. He endorses that he does have issues with memory but that this has not been becoming any worse.   CKD - Is followed by Nephrology.   Cardiac  - Significant cardiac history of a fib, MI and heart failure. He is followed by cardiology as well as the CHF clinic. Denies any issues with this currently.    Health Maintenance: Dental --  Does not see Vision -- Does not see Immunizations --UTD  Colonoscopy -- 2016 - Normal   He is followed by: Cardiology- Dr. Rayann Heman Heart Failure Clinic - Dr. Haroldine Laws Pulmonology- [facility] - Dr. Patricia [facility] - Dr. Mercy Moore  Past Medical History  Diagnosis Date  . Ischemic cardiomyopathy     EF 35-45%  in 2009  . Myocardial infarction ([place])     in  1986 with pci  to circumflex  . Atrial fibrillation ([place])     on pradaxa for stroke prevention  . Hypertension   . Stroke ([place])     in 2009, left fronto-temporal, due to a-fib  . Chronic systolic dysfunction of left ventricle   . GERD (gastroesophageal reflux disease)   . HLD (hyperlipidemia)   . Ventricular tachycardia ([place])     prior VT storm treated with amiodarone, followed by Dr. Rayann Heman, dual chamber defibrillator  . Obstructive sleep apnea     sees Dr. Don Broach  . DJD (degenerative joint disease)     of shoulder,  . CKD (chronic kidney disease), stage III 02/22/2011    Past Surgical History  Procedure Laterality Date  . Vocal cord surgery    . Icd implantation    . Hernia repair    . Angioplasty      Current Outpatient Prescriptions on File Prior to Visit  Medication Sig Dispense Refill  . amiodarone (PACERONE) 200 MG tablet Take 1 tablet (200 mg total) by mouth daily. 90 tablet 3  . apixaban (ELIQUIS) 2.5 MG TABS tablet Take 1 tablet (2.5 mg total) by mouth 2 (two) times daily. 180 tablet 3  . Blood Pressure Monitoring (BLOOD PRESSURE CUFF) MISC 1 each by Does not apply route daily. 1 each 0  . fenofibrate 160 MG tablet Take 1 tablet (160 mg total)  by mouth daily. 90 tablet 4  . finasteride (PROSCAR) 5 MG tablet Take 1 tablet (5 mg total) by mouth daily. 30 tablet 2  . furosemide (LASIX) 40 MG tablet Take 1 tablet (40 mg total) by mouth 2 (two) times a week. 15 tablet 3  . isosorbide-hydrALAZINE (BIDIL) 20-37.5 MG tablet Take 0.5 tablets by mouth 3 (three) times daily. 45 tablet 3  . levocetirizine (XYZAL) 5 MG tablet Take 1 tablet (5 mg total) by mouth every evening. 30 tablet 2  . metoprolol succinate (TOPROL-XL) 100 MG 24 hr tablet Take 100mg (1 tablet) by mouth in the am and 50mg (1/2 tablet) by mouth in the pm 135 tablet 3  . mirabegron ER (MYRBETRIQ) 25 MG TB24 tablet Take 25 mg by mouth daily.    . nitroGLYCERIN  (NITROSTAT) 0.4 MG SL tablet Place 0.4 mg under the tongue every 5 (five) minutes x 3 doses as needed for chest pain.    . rosuvastatin (CRESTOR) 40 MG tablet take 1 tablet by mouth once daily 90 tablet 2  . tamsulosin (FLOMAX) 0.4 MG CAPS capsule take 1 capsule by mouth once daily 30 capsule 2  . VIMPAT 50 MG TABS tablet take 1 tablet by mouth twice a day 180 tablet 2   No current facility-administered medications on file prior to visit.    Allergies  Allergen Reactions  . Aricept [Donepezil Hcl]     Worsens renal function  . Codeine     unknown  . Nexium [Esomeprazole]     Severely worsens renal function  . Omeprazole Hives  . Pantoprazole Sodium     REACTION: Reaction unknown  . Tramadol Nausea And Vomiting    Reaction: nausea    Family History  Problem Relation Age of Onset  . Heart disease Father   . Hyperlipidemia Father   . Hypertension Father   . Heart attack Father   . Diabetes Sister   . Sudden death Neg Hx     Social History   Social History  . Marital Status: Married    Spouse Name: N/A  . Number of Children: N/A  . Years of Education: N/A   Occupational History  . Retired    Social History Main Topics  . Smoking status: Former Research scientist (life sciences)  . Smokeless tobacco: Never Used  . Alcohol Use: No  . Drug Use: No  . Sexual Activity: Not on file   Other Topics Concern  . Not on file   Social History Narrative   ICD-[employer] Remote- Yes   Financial Assistance:  Application initiated.  Patient needs to submit further paperwork to complete per Bonna Gains 02/18/2010.   Financial Assistance: approved for 100% discount after Medicare pays for [facility] only, not eligible for [facility] card per Bonna Gains 04/29/10.             Review of Systems  Constitutional: Negative.   HENT: Negative.   Respiratory: Negative.   Cardiovascular: Negative.   Gastrointestinal: Negative.   Genitourinary: Positive for frequency.  Musculoskeletal: Positive for joint pain.  Negative for falls.  Skin: Negative.   Neurological: Negative.   Psychiatric/Behavioral: Negative.   All other systems reviewed and are negative.   BP 110/60 mmHg  Temp(Src) 98.4 F (36.9 C) (Oral)  Ht 5\' 9"  (1.753 m)  Wt 201 lb 14.4 oz (91.581 kg)  BMI 29.80 kg/m2  Physical Exam  Constitutional: He is oriented to person, place, and time and well-developed, well-nourished, and in no distress. No distress.  HENT:  Head: Normocephalic and atraumatic.  Right Ear: External ear normal.  Left Ear: External ear normal.  Nose: Nose normal.  Mouth/Throat: Oropharynx is clear and moist. No oropharyngeal exudate.  Eyes: Conjunctivae and EOM are normal. Pupils are equal, round, and reactive to light. Right eye exhibits no discharge. Left eye exhibits no discharge.  Cardiovascular: Normal rate, regular rhythm, normal heart sounds and intact distal pulses.  Exam reveals no gallop and no friction rub.   No murmur heard. + ICD  Pulmonary/Chest: Effort normal and breath sounds normal. No respiratory distress. He has no wheezes. He has no rales. He exhibits no tenderness.  Neurological: He is alert and oriented to person, place, and time. He displays normal reflexes. He exhibits normal muscle tone. Gait normal. Coordination normal. GCS score is 15.  Walks with a slow steady gait  Skin: Skin is warm. No rash noted. He is not diaphoretic. No erythema. No pallor.  Psychiatric: Mood, memory, affect and judgment normal.  Nursing note and vitals reviewed.   Assessment/Plan: 1. Encounter to establish care - Physical in the fall - Continue to increase exercise as tolerated - eat healthy   2. Gait disturbance, post-stroke - Prescription given for rolling walker with seat.  - Advise to use a cane - Ambulatory referral to Physical Therapy  3. Muscle left arm weakness - Can work with stress ball  - Ambulatory referral to Physical Therapy  4. Nocturia - Increased Myrbetriq from 25 to 50 mg -  mirabegron ER (MYRBETRIQ) 50 MG TB24 tablet; Take 1 tablet (50 mg total) by mouth daily.  Dispense: 90 tablet; Refill: 3 - Consider referral to Urology   Dorothyann Peng, NP

## 2015-12-02 NOTE — Patient Instructions (Signed)
It was great meeting you today.   Someone will call you to schedule your physical therapy appointments  I have called in a new prescription for Myrbetriq  Follow up with me this fall for your physical

## 2015-12-07 ENCOUNTER — Telehealth: Payer: Self-pay | Admitting: Adult Health

## 2015-12-07 NOTE — Telephone Encounter (Signed)
Pt needs new cpap mask,filters and tubing phone # [phone] fax # [phone] Pt has an appt with dr wert end of aug/ pt needs supplies before aug

## 2015-12-07 NOTE — Telephone Encounter (Signed)
Left message for patient to return phone call.  

## 2015-12-08 NOTE — Telephone Encounter (Signed)
Rx written and signed by Tommi Rumps - rx faxed to [facility] as directed.

## 2015-12-08 NOTE — Telephone Encounter (Signed)
I called and spoke with patient - patient states he just needs an rx for tubing, mask, and filter faxed into [facility]. He states he can take his CPAP machine to [facility] and they will give him the correct supplies needed to machine.

## 2015-12-11 ENCOUNTER — Other Ambulatory Visit: Payer: Self-pay | Admitting: Internal Medicine

## 2015-12-13 ENCOUNTER — Other Ambulatory Visit: Payer: Self-pay | Admitting: Adult Health

## 2015-12-13 ENCOUNTER — Telehealth (HOSPITAL_COMMUNITY): Payer: Self-pay | Admitting: Pharmacist

## 2015-12-13 DIAGNOSIS — N400 Enlarged prostate without lower urinary tract symptoms: Secondary | ICD-10-CM

## 2015-12-13 NOTE — Telephone Encounter (Signed)
Bidil 0.5 tab TID PA approved by [employer] Part D through 05/21/16. Also has [place] Medicaid so should pick up Part D copay.   Ruta Hinds. Velva Harman, PharmD, BCPS, CPP Clinical Pharmacist Pager: [phone] Phone: [phone] 12/13/2015 12:09 PM

## 2015-12-17 ENCOUNTER — Ambulatory Visit: Payer: Medicare HMO | Attending: Adult Health | Admitting: Physical Therapy

## 2015-12-17 VITALS — BP 147/79 | HR 55

## 2015-12-17 DIAGNOSIS — R278 Other lack of coordination: Secondary | ICD-10-CM | POA: Diagnosis present

## 2015-12-17 DIAGNOSIS — M79641 Pain in right hand: Secondary | ICD-10-CM

## 2015-12-17 DIAGNOSIS — R2681 Unsteadiness on feet: Secondary | ICD-10-CM

## 2015-12-17 DIAGNOSIS — R262 Difficulty in walking, not elsewhere classified: Secondary | ICD-10-CM | POA: Diagnosis present

## 2015-12-17 NOTE — Therapy (Signed)
[facility] [address] [place], [place], [address] Phone: [phone]   Fax:  [phone]  Physical Therapy Evaluation  Patient Details  Name: [name] MRN: [id number] Date of Birth: 02-Aug-1945 Referring Provider: Dorothyann Peng NP  Encounter Date: 12/17/2015      PT End of Session - 12/17/15 1147    Visit Number 1   Number of Visits 6   Date for PT Re-Evaluation 01/17/16   Authorization Type G code every 10th visit    PT Start Time 0845   PT Stop Time 0930   PT Time Calculation (min) 45 min   Activity Tolerance Patient tolerated treatment well;No increased pain   Behavior During Therapy WFL for tasks assessed/performed      Past Medical History:  Diagnosis Date  . Atrial fibrillation ([place])    on Eliquis  for stroke prevention  . Chronic systolic dysfunction of left ventricle   . CKD (chronic kidney disease), stage III 02/22/2011  . DJD (degenerative joint disease)    of shoulder,  . GERD (gastroesophageal reflux disease)   . HLD (hyperlipidemia)   . Hypertension   . Ischemic cardiomyopathy    EF 35-45% in 2009  . Myocardial infarction ([place])    in  1986 with pci  to circumflex  . Obstructive sleep apnea    sees Dr. Don Broach  . Stroke ([place])    in 2009, left fronto-temporal, due to a-fib  . Ventricular tachycardia ([place])    prior VT storm treated with amiodarone, followed by Dr. Rayann Heman, dual chamber defibrillator    Past Surgical History:  Procedure Laterality Date  . ANGIOPLASTY    . HERNIA REPAIR    . ICD Implantation    . vocal cord surgery     vocal cord stimulator     Vitals:   12/17/15 0849  BP: (!) 147/79  Pulse: (!) 55         Subjective Assessment - 12/17/15 0853    Subjective [name] came with his wife; they recently moved here from [place] and the want to get reestablished with PT ; CVA was in 2009;  he has been doing his PT tx  since about October of 2016;  he does want  to continue with hand therpay; and speech for cognitive assesment     Currently in Pain? Yes   Pain Score 4    Pain Location Hand   Pain Orientation Left   Pain Descriptors / Indicators Dull;Aching   Pain Type Chronic pain   Pain Onset More than a month ago   Aggravating Factors  cold weather   Pain Relieving Factors heat   Multiple Pain Sites Yes            [facility] PT Assessment - 12/17/15 0001      Assessment   Medical Diagnosis CVA; unsteady on feet; difficulty in walking    Referring Provider Dorothyann Peng NP   Onset Date/Surgical Date 12/17/15     Precautions   Precautions Fall     Balance Screen   Has the patient fallen in the past 6 months No   Has the patient had a decrease in activity level because of a fear of falling?  Yes   Is the patient reluctant to leave their home because of a fear of falling?  Yes     [place] Private residence   Living Arrangements Spouse/significant other   Type of [place]  Home Access Stairs to enter   Entrance Stairs-Number of Steps 2   Entrance Stairs-Rails None   Home Layout One level     Prior Function   Level of Independence Needs assistance with ADLs;Needs assistance with homemaking;Needs assistance with gait     Cognition   Overall Cognitive Status Within Functional Limits for tasks assessed     Ambulation/Gait   Ambulation/Gait Yes   Ambulation/Gait Assistance 5: Supervision   Ambulation Distance (Feet) 200 Feet   Assistive device None   Gait Pattern Right circumduction;Left circumduction;Poor foot clearance - left;Poor foot clearance - right;Wide base of support   Ambulation Surface Level   Gait velocity .9 m/s   Stairs Yes   Stairs Assistance 5: Supervision     Balance   Balance Assessed Yes     Standardized Balance Assessment   Standardized Balance Assessment Dynamic Gait Index;Berg Balance Test;Four Square Step Test;Five Times Sit to [employer]   Sit to  Stand Able to stand without using hands and stabilize independently   Standing Unsupported Able to stand safely 2 minutes   Sitting with Back Unsupported but Feet Supported on Floor or Stool Able to sit safely and securely 2 minutes   Stand to Sit Sits safely with minimal use of hands   Transfers Able to transfer safely, definite need of hands   Standing Unsupported with Eyes Closed Able to stand 10 seconds with supervision   Standing Ubsupported with Feet Together Able to place feet together independently and stand 1 minute safely   From Standing, Reach Forward with Outstretched Arm Can reach confidently >25 cm (10")   From Standing Position, Pick up Object from Floor Able to pick up shoe safely and easily   From Standing Position, Turn to Look Behind Over each Shoulder Looks behind from both sides and weight shifts well   Turn 360 Degrees Able to turn 360 degrees safely in 4 seconds or less   Standing Unsupported, Alternately Place Feet on Step/Stool Able to stand independently and safely and complete 8 steps in 20 seconds   Standing Unsupported, One Foot in Front Able to plae foot ahead of the other independently and hold 30 seconds   Standing on One Leg Able to lift leg independently and hold 5-10 seconds   Total Score 52     Dynamic Gait Index   Level Surface Mild Impairment   Change in Gait Speed Normal   Gait with Horizontal Head Turns Normal   Gait with Vertical Head Turns Normal   Gait and Pivot Turn Normal   Step Over Obstacle Normal   Step Around Obstacles Normal   Steps Normal   Total Score 23     Four Square Step Test    Trial One  13.9   Trial Two 12.2                           PT Education - 12/17/15 1146    Education provided Yes   Education Details Started pt on UE brachial plexus stretch to help with desensitization of L UE   Person(s) Educated Patient;Spouse   Methods Explanation   Comprehension Verbalized understanding          PT  Short Term Goals - 12/17/15 1159      PT SHORT TERM GOAL #1   Title same as LTGs           PT Long Term  Goals - 12/17/15 1156      PT LONG TERM GOAL #1   Title Patient to return demonstrate a comprehensive HEP he will agree to continuing    Time 4   Period Weeks   Status New     PT LONG TERM GOAL #2   Title Pt will  demonstrated fall recovery response  10/10 with dynamic balance challenges   Time 4   Period Weeks     PT LONG TERM GOAL #3   Title Pt will be albe to walk 15 minutes at 1.4 m/s or greater w/o adverse signs and symptoms- preferably on varied terrains   Time 15               Plan - 12/17/15 1148    Clinical Impression Statement Patient actually scored very well with balance tests today; he had dramatic loss of balance during the FSST and was albe to recover on his own; demonstrating an effective ankle,hip and step strategy.  However, with observing him walking in and out of the gym, when hes not focused on walking ;his pattern is unstable ; he did mention intermittent right knee pain;  his ligaments appeared in tact ; he did have some pain with medial meniscus test; need to follow up with that later; he denied an catchig sensation; but stated it does give way at times. He will benefit from PT to help develop a comprehensive HEP and for futher high level balance ther ex;; I will request  SLP -for cognition and memory and to assess voice and swallowing due to vocal implant and OT   for hand assessment.  He also has cardiac history and developing an effective HEP to improve carido - pulm will also be beneficial    Rehab Potential Good   PT Frequency 1x / week   PT Duration 4 weeks   PT Treatment/Interventions Gait training;Stair training;Therapeutic exercise;Balance training;Patient/family education;Neuromuscular re-education   PT Next Visit Plan dynamic balance ex's and further assess cardio capability    PT Home Exercise Plan started with brachial plexus stretch  as his main complaint is the chronic UE pain   Consulted and Agree with Plan of Care Patient;Family member/caregiver      Patient will benefit from skilled therapeutic intervention in order to improve the following deficits and impairments:  Abnormal gait, Decreased activity tolerance, Decreased balance, Decreased endurance, Decreased coordination, Decreased cognition, Decreased safety awareness, Impaired UE functional use, Pain  Visit Diagnosis: Unsteadiness on feet - Plan: PT plan of care cert/re-cert  Pain in right hand - Plan: PT plan of care cert/re-cert  Difficulty in walking, not elsewhere classified - Plan: PT plan of care cert/re-cert  Other lack of coordination - Plan: PT plan of care cert/re-cert      G-Codes - [id number]    Functional Assessment Tool Used DGI ; FSST; Berg   Functional Limitation Mobility: Walking and moving around   Mobility: Walking and Moving Around Current Status [phone]) At least 1 percent but less than 20 percent impaired, limited or restricted   Mobility: Walking and Moving Around Goal Status [phone]) 0 percent impaired, limited or restricted       Problem List Patient Active Problem List   Diagnosis Date Noted  . Vitamin D deficiency 09/06/2015  . Environmental allergies 09/06/2015  . Cardiomyopathy, ischemic 09/06/2015  . History of seizures 08/05/2015  . BPH (benign prostatic hyperplasia) 08/05/2015  . Hyperlipidemia 08/05/2015  . Imbalance 02/22/2011  . Shortness of breath 02/22/2011  .  Fatigue 02/22/2011  . CKD (chronic kidney disease), stage III 02/22/2011  . Special screening for malignant neoplasms, colon 08/29/2010  . VENTRICULAR TACHYCARDIA 12/23/2009  . Obstructive sleep apnea 03/11/2009  . Essential hypertension 09/18/2008  . Atrial fibrillation ([place]) 10/23/2007  . SHOULDER PAIN, LEFT 10/23/2007  . Cerebral artery occlusion with cerebral infarction ([place]) 09/06/2007  . UNSPECIFIED HEARING LOSS 07/29/2007  . CAD 04/12/2007   . Acute on chronic systolic heart failure ([place]) 04/12/2007    Rosaura Carpenter D PT DPT  12/17/2015, 12:10 PM  [facility] [address] [place], [place], [address] Phone: [phone]   Fax:  [phone]  Name: [name] MRN: [id number] Date of Birth: Aug 09, 1945

## 2015-12-17 NOTE — Patient Instructions (Addendum)
Upper Limb Neural Tension: Median III    Stand with left palm flat on wall, fingers back. Bend elbow, side-bend head away and Hold __3__ seconds. Then straighten elbow and Hold ____ seconds. Repeat __3__ times per set. Do __1__ sets per session. Do __3__ sessions per day.  http://orth.exer.us/406   Copyright  VHI. All rights reserved.

## 2015-12-22 ENCOUNTER — Telehealth: Payer: Self-pay | Admitting: Pulmonary Disease

## 2015-12-22 ENCOUNTER — Encounter: Payer: Self-pay | Admitting: Pulmonary Disease

## 2015-12-22 ENCOUNTER — Ambulatory Visit (INDEPENDENT_AMBULATORY_CARE_PROVIDER_SITE_OTHER): Payer: Medicare HMO | Admitting: Pulmonary Disease

## 2015-12-22 ENCOUNTER — Ambulatory Visit (INDEPENDENT_AMBULATORY_CARE_PROVIDER_SITE_OTHER)
Admission: RE | Admit: 2015-12-22 | Discharge: 2015-12-22 | Disposition: A | Discharge status: Home / Self Care | Payer: Medicare HMO | Source: Ambulatory Visit | Attending: Pulmonary Disease | Admitting: Pulmonary Disease

## 2015-12-22 VITALS — BP 126/74 | HR 58 | Ht 69.0 in | Wt 203.0 lb

## 2015-12-22 DIAGNOSIS — R0600 Dyspnea, unspecified: Secondary | ICD-10-CM

## 2015-12-22 DIAGNOSIS — G4733 Obstructive sleep apnea (adult) (pediatric): Secondary | ICD-10-CM

## 2015-12-22 DIAGNOSIS — Z9989 Dependence on other enabling machines and devices: Secondary | ICD-10-CM

## 2015-12-22 NOTE — Patient Instructions (Addendum)
   Call me if you have any new breathing problems before your next appointment or questions.  We will review your test results at your next appointment or sooner if needed.  TESTS ORDERED: 1. CXR PA/LAT TODAY 2. CPAP Download ([facility]) 3. Spirometry with bronchodilator challenge & DLCO at follow-up

## 2015-12-22 NOTE — Telephone Encounter (Signed)
PFT 10/20/15: FVC 3.63 L (92%) FEV1 2.81 L (94%) FEV1/FVC 0.77 FEF 25-75 2.37 L (92%) no bronchodilator response TLC 6.15 L (86%) RV 97% ERV 54% DLCO corrected 73% (hgb 13.8)  CARDAIC TTE (09/08/15): Mild LVH & moderate dilation with EF 25-30%. Septal, apical, & inferior wall hypokinesis. LA moderately dilated & RA normal in size. RV normal in size and function. Pulmonary artery systolic pressure 32 mmHg. No aortic stenosis. Mild-to-moderate mitral regurgitation. Mild pulmonic regurgitation. Mild tricuspid regurgitation.
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
## 2015-12-22 NOTE — Addendum Note (Signed)
Addended by: Inge Rise on: 12/22/2015 05:02 PM   Modules accepted: Orders

## 2015-12-27 ENCOUNTER — Encounter: Payer: Self-pay | Admitting: Rehabilitation

## 2015-12-27 ENCOUNTER — Telehealth: Payer: Self-pay | Admitting: Pulmonary Disease

## 2015-12-27 ENCOUNTER — Ambulatory Visit: Payer: Medicare HMO | Attending: Adult Health | Admitting: Rehabilitation

## 2015-12-27 DIAGNOSIS — R262 Difficulty in walking, not elsewhere classified: Secondary | ICD-10-CM

## 2015-12-27 DIAGNOSIS — R278 Other lack of coordination: Secondary | ICD-10-CM | POA: Insufficient documentation

## 2015-12-27 DIAGNOSIS — Z9989 Dependence on other enabling machines and devices: Secondary | ICD-10-CM

## 2015-12-27 DIAGNOSIS — M79641 Pain in right hand: Secondary | ICD-10-CM | POA: Insufficient documentation

## 2015-12-27 DIAGNOSIS — R2681 Unsteadiness on feet: Secondary | ICD-10-CM | POA: Insufficient documentation

## 2015-12-27 DIAGNOSIS — G4733 Obstructive sleep apnea (adult) (pediatric): Secondary | ICD-10-CM

## 2015-12-27 NOTE — Telephone Encounter (Signed)
Spoke with pt's wife and advised that order for CPAP supplies placed. [facility] will contact [facility] if they need copy of SST. Nothing further needed.
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
## 2015-12-27 NOTE — Telephone Encounter (Signed)
Pt wife cb to give [place] the fax number for [facility] [phone] to send order and sleep study And to also inform JN that [facility] has made the patient an appt to get download for 8/11 @ 9am

## 2015-12-27 NOTE — Patient Instructions (Addendum)
"  I love a Database administrator   At counter for balance as needed: high knee marching forward and then backward. 3 second pauses with each knee lift.  Repeat 3 laps each way. Do _1-2_ sessions per day. http://gt2.exer.us/345    Copyright  VHI. All rights reserved.  Walking on Heels   At counter: Walk on heels forward while continuing on a straight path, and then walk on heels backward to starting position. Repeat for 3 laps each way. Do _1-2__ sessions per day.   Copyright  VHI. All rights reserved.  Feet Heel-Toe "Tandem"   At counter: Arms at sides, walk a straight line forward bringing one foot directly in front of the other, and then a straight line backwards bringing one foot directly behind the other one.  Repeat for _3 laps each way. Do _1-2_ sessions per day.  Copyright  VHI. All rights reserved.  Braiding   At counter for  Balance as needed: walking sideways with following pattern "front, step, back, step" along counter, repeat the pattern the other way to start position. Repeat 3 laps each direction. Do __1-2__ sessions per day.  Copyright  VHI. All rights reserved.  SINGLE LIMB STANCE    Stand at counter top for support if needed: single leg on floor. Raise leg. Hold __20_ seconds. Repeat with other leg. _3__ reps per set, _2__ sets per day, _5-7__ days per week  Copyright  VHI. All rights reserved.

## 2015-12-29 ENCOUNTER — Ambulatory Visit (HOSPITAL_COMMUNITY)
Admission: RE | Admit: 2015-12-29 | Discharge: 2015-12-29 | Disposition: A | Discharge status: Home / Self Care | Payer: Medicare HMO | Source: Ambulatory Visit | Attending: Pulmonary Disease | Admitting: Pulmonary Disease

## 2015-12-29 DIAGNOSIS — R0600 Dyspnea, unspecified: Secondary | ICD-10-CM | POA: Insufficient documentation

## 2015-12-29 LAB — PULMONARY FUNCTION TEST
DL/VA % PRED: 76 %
DL/VA: 3.5 ml/min/mmHg/L
DLCO UNC: 17.32 ml/min/mmHg
DLCO unc % pred: 53 %
FEF 25-75 POST: 2.34 L/s
FEF 25-75 Pre: 2.41 L/sec
FEF2575-%Change-Post: -3 %
FEF2575-%PRED-POST: 92 %
FEF2575-%PRED-PRE: 95 %
FEV1-%CHANGE-POST: 0 %
FEV1-%PRED-PRE: 91 %
FEV1-%Pred-Post: 92 %
FEV1-Post: 2.68 L
FEV1-Pre: 2.67 L
FEV1FVC-%Change-Post: 7 %
FEV1FVC-%PRED-PRE: 98 %
FEV6-%Change-Post: -7 %
FEV6-%Pred-Post: 88 %
FEV6-%Pred-Pre: 95 %
FEV6-POST: 3.26 L
FEV6-Pre: 3.5 L
FEV6FVC-%CHANGE-POST: 0 %
FEV6FVC-%Pred-Post: 105 %
FEV6FVC-%Pred-Pre: 104 %
FVC-%Change-Post: -6 %
FVC-%PRED-POST: 85 %
FVC-%PRED-PRE: 91 %
FVC-POST: 3.29 L
FVC-PRE: 3.52 L
POST FEV1/FVC RATIO: 81 %
PRE FEV1/FVC RATIO: 76 %
PRE FEV6/FVC RATIO: 100 %
Post FEV6/FVC ratio: 100 %
RV % pred: 97 %
RV: 2.38 L
TLC % PRED: 85 %
TLC: 5.99 L

## 2015-12-29 MED ORDER — ALBUTEROL SULFATE (2.5 MG/3ML) 0.083% IN NEBU
2.5000 mg | INHALATION_SOLUTION | Freq: Once | RESPIRATORY_TRACT | Status: AC
  Administered 2015-12-29: 2.5 mg via RESPIRATORY_TRACT

## 2015-12-30 ENCOUNTER — Encounter (HOSPITAL_COMMUNITY): Payer: Self-pay | Admitting: Internal Medicine

## 2015-12-30 ENCOUNTER — Telehealth: Payer: Self-pay | Admitting: *Deleted

## 2015-12-30 ENCOUNTER — Ambulatory Visit (HOSPITAL_COMMUNITY)
Admission: RE | Admit: 2015-12-30 | Discharge: 2015-12-30 | Disposition: A | Discharge status: Home / Self Care | Payer: Medicare HMO | Source: Ambulatory Visit | Attending: Internal Medicine | Admitting: Internal Medicine

## 2015-12-30 VITALS — BP 134/74 | HR 60 | Wt 206.0 lb

## 2015-12-30 DIAGNOSIS — Z87891 Personal history of nicotine dependence: Secondary | ICD-10-CM | POA: Diagnosis not present

## 2015-12-30 DIAGNOSIS — I252 Old myocardial infarction: Secondary | ICD-10-CM | POA: Diagnosis not present

## 2015-12-30 DIAGNOSIS — E785 Hyperlipidemia, unspecified: Secondary | ICD-10-CM | POA: Diagnosis not present

## 2015-12-30 DIAGNOSIS — I2510 Atherosclerotic heart disease of native coronary artery without angina pectoris: Secondary | ICD-10-CM | POA: Insufficient documentation

## 2015-12-30 DIAGNOSIS — Z8249 Family history of ischemic heart disease and other diseases of the circulatory system: Secondary | ICD-10-CM | POA: Insufficient documentation

## 2015-12-30 DIAGNOSIS — Z885 Allergy status to narcotic agent status: Secondary | ICD-10-CM | POA: Diagnosis not present

## 2015-12-30 DIAGNOSIS — Z95810 Presence of automatic (implantable) cardiac defibrillator: Secondary | ICD-10-CM | POA: Diagnosis not present

## 2015-12-30 DIAGNOSIS — R0602 Shortness of breath: Secondary | ICD-10-CM

## 2015-12-30 DIAGNOSIS — I480 Paroxysmal atrial fibrillation: Secondary | ICD-10-CM

## 2015-12-30 DIAGNOSIS — N183 Chronic kidney disease, stage 3 unspecified: Secondary | ICD-10-CM

## 2015-12-30 DIAGNOSIS — Z841 Family history of disorders of kidney and ureter: Secondary | ICD-10-CM | POA: Insufficient documentation

## 2015-12-30 DIAGNOSIS — Z888 Allergy status to other drugs, medicaments and biological substances status: Secondary | ICD-10-CM | POA: Diagnosis not present

## 2015-12-30 DIAGNOSIS — K219 Gastro-esophageal reflux disease without esophagitis: Secondary | ICD-10-CM | POA: Insufficient documentation

## 2015-12-30 DIAGNOSIS — G4733 Obstructive sleep apnea (adult) (pediatric): Secondary | ICD-10-CM | POA: Diagnosis not present

## 2015-12-30 DIAGNOSIS — Z79899 Other long term (current) drug therapy: Secondary | ICD-10-CM | POA: Insufficient documentation

## 2015-12-30 DIAGNOSIS — I482 Chronic atrial fibrillation, unspecified: Secondary | ICD-10-CM

## 2015-12-30 DIAGNOSIS — I130 Hypertensive heart and chronic kidney disease with heart failure and stage 1 through stage 4 chronic kidney disease, or unspecified chronic kidney disease: Secondary | ICD-10-CM | POA: Insufficient documentation

## 2015-12-30 DIAGNOSIS — Z833 Family history of diabetes mellitus: Secondary | ICD-10-CM | POA: Diagnosis not present

## 2015-12-30 DIAGNOSIS — Z7901 Long term (current) use of anticoagulants: Secondary | ICD-10-CM | POA: Diagnosis not present

## 2015-12-30 DIAGNOSIS — I5022 Chronic systolic (congestive) heart failure: Secondary | ICD-10-CM | POA: Diagnosis not present

## 2015-12-30 DIAGNOSIS — Z8673 Personal history of transient ischemic attack (TIA), and cerebral infarction without residual deficits: Secondary | ICD-10-CM | POA: Insufficient documentation

## 2015-12-30 DIAGNOSIS — M1990 Unspecified osteoarthritis, unspecified site: Secondary | ICD-10-CM | POA: Insufficient documentation

## 2015-12-30 DIAGNOSIS — Z825 Family history of asthma and other chronic lower respiratory diseases: Secondary | ICD-10-CM | POA: Insufficient documentation

## 2015-12-30 MED ORDER — APIXABAN 5 MG PO TABS: 5.0000 mg | ORAL_TABLET | Freq: Two times a day (BID) | ORAL | 3 refills | Status: DC

## 2015-12-30 MED ORDER — ISOSORB DINITRATE-HYDRALAZINE 20-37.5 MG PO TABS: 1.0000 | ORAL_TABLET | Freq: Three times a day (TID) | ORAL | 3 refills | Status: DC

## 2015-12-30 NOTE — Patient Instructions (Addendum)
Increase Eliquis to 5mg  twice daily.  Increase Bidil to 1 tablet three times daily.   Follow up with Dr. Haroldine Laws in 2 months.  Follow up in 4 weeks for pharmacy visit.

## 2015-12-30 NOTE — Telephone Encounter (Signed)
Spoke with pt spouse and is aware of results. 6MW scheduled for 9/6 at 9AM. HRCT ordered.
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
## 2015-12-30 NOTE — Telephone Encounter (Signed)
-----   Message from Javier Glazier, MD sent at 12/30/2015  1:41 PM EDT ----- Please call the patient and let him know that his breathing tests have worsened more than I would expect since previous testing. We need to make sure he has a 6MWT on RA at his follow-up and please order a HRCT Chest w/o contrast.  Thanks.

## 2016-01-03 ENCOUNTER — Encounter: Payer: Self-pay | Admitting: Rehabilitation

## 2016-01-03 ENCOUNTER — Ambulatory Visit: Payer: Medicare HMO | Admitting: Rehabilitation

## 2016-01-03 ENCOUNTER — Other Ambulatory Visit (INDEPENDENT_AMBULATORY_CARE_PROVIDER_SITE_OTHER): Payer: Medicare HMO

## 2016-01-03 DIAGNOSIS — Z0000 Encounter for general adult medical examination without abnormal findings: Secondary | ICD-10-CM | POA: Diagnosis not present

## 2016-01-03 DIAGNOSIS — R2681 Unsteadiness on feet: Secondary | ICD-10-CM | POA: Diagnosis not present

## 2016-01-03 DIAGNOSIS — R262 Difficulty in walking, not elsewhere classified: Secondary | ICD-10-CM

## 2016-01-03 LAB — CBC WITH DIFFERENTIAL/PLATELET
Basophils Absolute: 0 10*3/uL (ref 0.0–0.1)
Basophils Relative: 0.5 % (ref 0.0–3.0)
EOS ABS: 0.2 10*3/uL (ref 0.0–0.7)
EOS PCT: 2.6 % (ref 0.0–5.0)
HCT: 40.5 % (ref 39.0–52.0)
Hemoglobin: 13.9 g/dL (ref 13.0–17.0)
LYMPHS ABS: 2.5 10*3/uL (ref 0.7–4.0)
Lymphocytes Relative: 34.1 % (ref 12.0–46.0)
MCHC: 34.2 g/dL (ref 30.0–36.0)
MCV: 90.7 fl (ref 78.0–100.0)
MONO ABS: 0.7 10*3/uL (ref 0.1–1.0)
Monocytes Relative: 10 % (ref 3.0–12.0)
NEUTROS PCT: 52.8 % (ref 43.0–77.0)
Neutro Abs: 3.9 10*3/uL (ref 1.4–7.7)
Platelets: 246 10*3/uL (ref 150.0–400.0)
RBC: 4.46 Mil/uL (ref 4.22–5.81)
RDW: 14.3 % (ref 11.5–15.5)
WBC: 7.3 10*3/uL (ref 4.0–10.5)

## 2016-01-03 LAB — POC URINALSYSI DIPSTICK (AUTOMATED)
Bilirubin, UA: NEGATIVE
GLUCOSE UA: NEGATIVE
Ketones, UA: NEGATIVE
Leukocytes, UA: NEGATIVE
Nitrite, UA: NEGATIVE
PH UA: 5.5
RBC UA: NEGATIVE
SPEC GRAV UA: 1.025
UROBILINOGEN UA: 0.2

## 2016-01-03 LAB — BASIC METABOLIC PANEL
BUN: 22 mg/dL (ref 6–23)
CALCIUM: 9.8 mg/dL (ref 8.4–10.5)
CO2: 29 mEq/L (ref 19–32)
CREATININE: 1.97 mg/dL — AB (ref 0.40–1.50)
Chloride: 104 mEq/L (ref 96–112)
GFR: 43.52 mL/min — AB (ref >60.00)
GLUCOSE: 146 mg/dL — AB (ref 70–99)
POTASSIUM: 4.2 meq/L (ref 3.5–5.1)
Sodium: 140 mEq/L (ref 135–145)

## 2016-01-03 LAB — HEPATIC FUNCTION PANEL
ALT: 32 U/L (ref 0–53)
AST: 31 U/L (ref 0–37)
Albumin: 4.5 g/dL (ref 3.5–5.2)
Alkaline Phosphatase: 43 U/L (ref 39–117)
BILIRUBIN DIRECT: 0.2 mg/dL (ref 0.0–0.3)
BILIRUBIN TOTAL: 0.5 mg/dL (ref 0.2–1.2)
Total Protein: 7.2 g/dL (ref 6.0–8.3)

## 2016-01-03 LAB — LIPID PANEL
CHOL/HDL RATIO: 5
Cholesterol: 133 mg/dL (ref 0–200)
HDL: 29.4 mg/dL — AB (ref >39.00)
LDL Cholesterol: 75 mg/dL (ref 0–99)
NONHDL: 103.85
Triglycerides: 145 mg/dL (ref 0.0–149.0)
VLDL: 29 mg/dL (ref 0.0–40.0)

## 2016-01-03 LAB — PSA: PSA: 0.35 ng/mL (ref 0.10–4.00)

## 2016-01-03 LAB — TSH: TSH: 5.25 u[IU]/mL — AB (ref 0.35–4.50)

## 2016-01-04 ENCOUNTER — Ambulatory Visit (INDEPENDENT_AMBULATORY_CARE_PROVIDER_SITE_OTHER)
Admission: RE | Admit: 2016-01-04 | Discharge: 2016-01-04 | Disposition: A | Discharge status: Home / Self Care | Payer: Medicare HMO | Source: Ambulatory Visit | Attending: Pulmonary Disease | Admitting: Pulmonary Disease

## 2016-01-04 ENCOUNTER — Telehealth: Payer: Self-pay | Admitting: Pulmonary Disease

## 2016-01-04 DIAGNOSIS — R0602 Shortness of breath: Secondary | ICD-10-CM | POA: Diagnosis not present
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
## 2016-01-04 NOTE — Telephone Encounter (Signed)
POLYSOMNOGRAM (4/5/7): Lowest saturation 79% & Baseline saturation 97%. Periodic limb movement index 0. AHI 84 events/hour. Respiratory disturbances not related to sleep stage. Patient slept exclusively in supine position. Paced rhythm with frequent PVCs noted. Patient was ultimately titrated to an optimal pressure on BiPAP at 29/24 with resolution of snoring. He was recommended to start BiPAP at 29/25 with an InnoMed Hybrid mask & medium nasal pillows due to leakage.

## 2016-01-10 ENCOUNTER — Encounter: Payer: Self-pay | Admitting: Rehabilitation

## 2016-01-10 ENCOUNTER — Ambulatory Visit: Payer: Medicare HMO | Admitting: Rehabilitation

## 2016-01-10 ENCOUNTER — Other Ambulatory Visit: Payer: Self-pay | Admitting: Adult Health

## 2016-01-10 ENCOUNTER — Other Ambulatory Visit: Payer: Self-pay | Admitting: Internal Medicine

## 2016-01-10 DIAGNOSIS — R2681 Unsteadiness on feet: Secondary | ICD-10-CM

## 2016-01-10 DIAGNOSIS — N400 Enlarged prostate without lower urinary tract symptoms: Secondary | ICD-10-CM

## 2016-01-10 DIAGNOSIS — M79641 Pain in right hand: Secondary | ICD-10-CM

## 2016-01-10 DIAGNOSIS — R262 Difficulty in walking, not elsewhere classified: Secondary | ICD-10-CM
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
## 2016-01-12 ENCOUNTER — Encounter: Payer: Self-pay | Admitting: *Deleted

## 2016-01-12 ENCOUNTER — Telehealth (HOSPITAL_COMMUNITY): Payer: Self-pay | Admitting: Vascular Surgery

## 2016-01-12 NOTE — Telephone Encounter (Signed)
Message to change pt appt 01/24/16 to the 9/6

## 2016-01-13 ENCOUNTER — Encounter: Payer: Medicare HMO | Admitting: Adult Health

## 2016-01-17 ENCOUNTER — Encounter: Payer: Self-pay | Admitting: Rehabilitation

## 2016-01-17 ENCOUNTER — Other Ambulatory Visit: Payer: Self-pay | Admitting: Internal Medicine

## 2016-01-17 ENCOUNTER — Ambulatory Visit: Payer: Medicare HMO | Admitting: Rehabilitation

## 2016-01-17 ENCOUNTER — Telehealth: Payer: Self-pay | Admitting: Internal Medicine

## 2016-01-17 DIAGNOSIS — R2681 Unsteadiness on feet: Secondary | ICD-10-CM

## 2016-01-17 DIAGNOSIS — R278 Other lack of coordination: Secondary | ICD-10-CM

## 2016-01-17 DIAGNOSIS — R262 Difficulty in walking, not elsewhere classified: Secondary | ICD-10-CM

## 2016-01-17 NOTE — Patient Instructions (Signed)
"  I love a Database administrator   At counter for balance as needed: high knee marching forward and then backward. 3 second pauses with each knee lift.  Repeat 3 laps each way. Do _1-2_ sessions per day. http://gt2.exer.us/345    Copyright  VHI. All rights reserved.  Walking on Heels   At counter: Walk on heels forward while continuing on a straight path, and then walk on heels backward to starting position. Repeat for 3 laps each way. Do _1-2__ sessions per day.   Copyright  VHI. All rights reserved.  Feet Heel-Toe "Tandem"   At counter: Arms at sides, walk a straight line forward bringing one foot directly in front of the other, and then a straight line backwards bringing one foot directly behind the other one.  Repeat for _3 laps each way. Do _1-2_ sessions per day.  Copyright  VHI. All rights reserved.  Braiding   At counter for  Balance as needed: walking sideways with following pattern "front, step, back, step" along counter, repeat the pattern the other way to start position. Repeat 3 laps each direction. Do __1-2__ sessions per day.  Copyright  VHI. All rights reserved.  SINGLE LIMB STANCE    Stand at counter top for support if needed: single leg on floor. Raise leg. Hold __20_ seconds. Repeat with other leg. _3__ reps per set, _2__ sets per day, _5-7__ days per week

## 2016-01-17 NOTE — Telephone Encounter (Signed)
Spoke with patients wife and she was actually requesting a refill on flomax. I made her aware that cardiology does not refill this medication. She stated that the patient has a urologist and she will call that office or pcp.
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
## 2016-01-17 NOTE — Telephone Encounter (Signed)
New message     Pt would like a prescription for furosemide generic 0.4 mg. Please send to [facility] [address] Please call.

## 2016-01-20 ENCOUNTER — Telehealth: Payer: Self-pay | Admitting: Pulmonary Disease

## 2016-01-20 NOTE — Progress Notes (Signed)
Pt aware of CT results. Pt voiced understanding. Nothing further needed.

## 2016-01-20 NOTE — Telephone Encounter (Signed)
Pt aware of CT results. Pt voiced understanding. Nothing further needed.

## 2016-01-24 ENCOUNTER — Ambulatory Visit (HOSPITAL_COMMUNITY): Payer: Medicare HMO

## 2016-01-26 ENCOUNTER — Ambulatory Visit (HOSPITAL_COMMUNITY): Payer: Medicare HMO

## 2016-01-26 ENCOUNTER — Encounter: Payer: Self-pay | Admitting: Internal Medicine

## 2016-01-26 ENCOUNTER — Telehealth: Payer: Self-pay | Admitting: Pulmonary Disease

## 2016-01-26 ENCOUNTER — Ambulatory Visit (INDEPENDENT_AMBULATORY_CARE_PROVIDER_SITE_OTHER): Payer: Medicare HMO | Admitting: Internal Medicine

## 2016-01-26 ENCOUNTER — Ambulatory Visit (INDEPENDENT_AMBULATORY_CARE_PROVIDER_SITE_OTHER): Payer: Medicare HMO | Admitting: Pulmonary Disease

## 2016-01-26 ENCOUNTER — Ambulatory Visit: Payer: Medicare HMO

## 2016-01-26 VITALS — BP 120/60 | HR 58 | Ht 69.0 in | Wt 202.0 lb

## 2016-01-26 DIAGNOSIS — I5022 Chronic systolic (congestive) heart failure: Secondary | ICD-10-CM | POA: Diagnosis not present

## 2016-01-26 DIAGNOSIS — I472 Ventricular tachycardia, unspecified: Secondary | ICD-10-CM

## 2016-01-26 DIAGNOSIS — N183 Chronic kidney disease, stage 3 unspecified: Secondary | ICD-10-CM

## 2016-01-26 DIAGNOSIS — R0602 Shortness of breath: Secondary | ICD-10-CM | POA: Diagnosis not present

## 2016-01-26 DIAGNOSIS — I480 Paroxysmal atrial fibrillation: Secondary | ICD-10-CM | POA: Diagnosis not present

## 2016-01-26 LAB — BASIC METABOLIC PANEL
BUN: 17 mg/dL (ref 7–25)
CHLORIDE: 105 mmol/L (ref 98–110)
CO2: 29 mmol/L (ref 20–31)
CREATININE: 1.86 mg/dL — AB (ref 0.70–1.25)
Calcium: 9.8 mg/dL (ref 8.6–10.3)
Glucose, Bld: 83 mg/dL (ref 65–99)
Potassium: 4.4 mmol/L (ref 3.5–5.3)
Sodium: 142 mmol/L (ref 135–146)

## 2016-01-26 LAB — MAGNESIUM: MAGNESIUM: 1.9 mg/dL (ref 1.5–2.5)

## 2016-01-26 MED ORDER — SOTALOL HCL 80 MG PO TABS: 80.0000 mg | ORAL_TABLET | Freq: Every day | ORAL | 3 refills | Status: DC

## 2016-01-26 MED ORDER — METOPROLOL SUCCINATE ER 100 MG PO TB24: ORAL_TABLET | ORAL | Status: DC

## 2016-01-26 NOTE — Patient Instructions (Addendum)
Medication Instructions:  Your physician has recommended you make the following change in your medication:  1) Stop Amiodarone 2) Start Sotalol 80 mg daily 3) Decrease Metoprolol to 50 mg daily    Labwork: Your physician recommends that you return for lab work today: BMP/Mag    Testing/Procedures: None ordered   Follow-Up: Your physician recommends that you schedule a follow-up appointment---EKG on Fri---Kelly will do. 2 weeks with Dr Rayann Heman    Any Other Special Instructions Will Be Listed Below (If Applicable).     If you need a refill on your cardiac medications before your next appointment, please call your pharmacy.

## 2016-01-26 NOTE — Telephone Encounter (Signed)
FYI for JN; no call back needed to patient or wife. I spoke with wife about this when patient came in for 6MW today. Wife will bring updated information for JN at tomorrow's visit.

## 2016-01-26 NOTE — Progress Notes (Signed)
PFT 12/29/15: FVC 3.52 L (91%) FEV1 2.67 L (91%) FEV1/FVC 0.76 FEF 25-75 2.41 L (95%) negative bronchodilator response TLC 5.99 L (85%) RV 97% ERV 35% DLCO uncorrected 53%  6MWT 01/26/16:  Walked 456 meters / Baseline Sat 98% on RA / Nadir Sat 91% on RA @ end of test
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
## 2016-01-27 ENCOUNTER — Other Ambulatory Visit (INDEPENDENT_AMBULATORY_CARE_PROVIDER_SITE_OTHER): Payer: Medicare HMO

## 2016-01-27 ENCOUNTER — Encounter: Payer: Self-pay | Admitting: Pulmonary Disease

## 2016-01-27 ENCOUNTER — Ambulatory Visit (INDEPENDENT_AMBULATORY_CARE_PROVIDER_SITE_OTHER): Payer: Medicare HMO | Admitting: Pulmonary Disease

## 2016-01-27 VITALS — BP 124/64 | HR 60 | Ht 69.0 in | Wt 204.8 lb

## 2016-01-27 DIAGNOSIS — J849 Interstitial pulmonary disease, unspecified: Secondary | ICD-10-CM

## 2016-01-27 DIAGNOSIS — J438 Other emphysema: Secondary | ICD-10-CM | POA: Diagnosis not present

## 2016-01-27 DIAGNOSIS — Z23 Encounter for immunization: Secondary | ICD-10-CM

## 2016-01-27 DIAGNOSIS — Z9989 Dependence on other enabling machines and devices: Secondary | ICD-10-CM

## 2016-01-27 DIAGNOSIS — G4733 Obstructive sleep apnea (adult) (pediatric): Secondary | ICD-10-CM | POA: Diagnosis not present

## 2016-01-27 LAB — CUP PACEART INCLINIC DEVICE CHECK
Date Time Interrogation Session: 20170906040000
HighPow Impedance: 49 Ohm
HighPow Impedance: 53 Ohm
Implantable Lead Implant Date: 20050922
Implantable Lead Location: 753859
Implantable Lead Location: 753860
Implantable Lead Model: 5076
Lead Channel Impedance Value: 456 Ohm
Lead Channel Impedance Value: 491 Ohm
Lead Channel Sensing Intrinsic Amplitude: 3 mV
Lead Channel Setting Pacing Amplitude: 2.4 V
Lead Channel Setting Pacing Pulse Width: 0.4 ms
MDC IDC LEAD IMPLANT DT: 20050922
MDC IDC LEAD MODEL: 158
MDC IDC LEAD SERIAL: 156891
MDC IDC MSMT LEADCHNL RA PACING THRESHOLD AMPLITUDE: 0.5 V
MDC IDC MSMT LEADCHNL RA PACING THRESHOLD PULSEWIDTH: 0.4 ms
MDC IDC MSMT LEADCHNL RV PACING THRESHOLD AMPLITUDE: 1 V
MDC IDC MSMT LEADCHNL RV PACING THRESHOLD PULSEWIDTH: 0.4 ms
MDC IDC MSMT LEADCHNL RV SENSING INTR AMPL: 24 mV
MDC IDC PG SERIAL: 141895
MDC IDC SET LEADCHNL RA PACING AMPLITUDE: 2 V
MDC IDC SET LEADCHNL RV SENSING SENSITIVITY: 0.6 mV

## 2016-01-27 LAB — SEDIMENTATION RATE: Sed Rate: 12 mm/hr (ref 0–20)

## 2016-01-27 LAB — C-REACTIVE PROTEIN: CRP: 0.1 mg/dL — ABNORMAL LOW (ref 0.5–20.0)
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
## 2016-01-27 NOTE — Patient Instructions (Addendum)
   Call me if you have any questions or new breathing problems before your next appointment.  Remember to get your flu shot in 3-4 weeks from today.  I will see you back in 3 months with a breathing & walking test at that time or sooner if needed.  TESTS ORDERED: 1. Serum ESR, CRP, Alpha-1 Antitrypsin Phenotype, ANA w/ reflex to comprehensive panel, Anti-CCP, & Hypersensitivity Pneumonitis Panel 2. Spirometry with DLCO at next appointment 3. 6MWT on room air at next appointment

## 2016-01-28 ENCOUNTER — Ambulatory Visit (INDEPENDENT_AMBULATORY_CARE_PROVIDER_SITE_OTHER): Payer: Medicare HMO | Admitting: *Deleted

## 2016-01-28 DIAGNOSIS — I480 Paroxysmal atrial fibrillation: Secondary | ICD-10-CM | POA: Diagnosis not present

## 2016-01-28 LAB — CYCLIC CITRUL PEPTIDE ANTIBODY, IGG: Cyclic Citrullin Peptide Ab: <16 Units

## 2016-01-28 NOTE — Progress Notes (Signed)
Pt here for EKG on Sotalol 80 mg daily

## 2016-01-31 LAB — HYPERSENSITIVITY PNEUMONITIS
A. FUMIGATUS #1 ABS: NEGATIVE
A. PULLULANS ABS: NEGATIVE
Micropolyspora faeni, IgG: NEGATIVE
Pigeon Serum Abs: NEGATIVE
THERMOACT. SACCHARII: NEGATIVE
Thermoactinomyces vulgaris, IgG: NEGATIVE

## 2016-01-31 LAB — ANA COMPREHENSIVE PANEL
Centromere Ab Screen: <0.2 AI (ref 0.0–0.9)
ENA RNP Ab: 0.5 AI (ref 0.0–0.9)
ENA SSA (RO) Ab: <0.2 AI (ref 0.0–0.9)
ENA SSB (LA) Ab: <0.2 AI (ref 0.0–0.9)

## 2016-01-31 LAB — ANA W/REFLEX: ANA: NEGATIVE

## 2016-02-01 LAB — ALPHA-1 ANTITRYPSIN PHENOTYPE: A1 ANTITRYPSIN: 132 mg/dL (ref 83–199)

## 2016-02-02 ENCOUNTER — Telehealth: Payer: Self-pay | Admitting: Pulmonary Disease

## 2016-02-02 ENCOUNTER — Telehealth: Payer: Self-pay | Admitting: *Deleted

## 2016-02-02 DIAGNOSIS — G4733 Obstructive sleep apnea (adult) (pediatric): Secondary | ICD-10-CM

## 2016-02-02 NOTE — Telephone Encounter (Signed)
-----   Message from [name], MD sent at 01/31/2016  1:51 PM EDT ----- Results reviewed.  [name], please inform pt of result. I will route to primary care also.

## 2016-02-02 NOTE — Telephone Encounter (Signed)
CPAP DOWNLOAD 07/12-08/10/17:  CPAP set at 16 cm H2O pressure. 100% usage. Average usage 7 hours 34 minutes. Average time in large leak per day 1 minute 2 seconds. Average AHI 22.4.  Ordering repeat CPAP titration given significant residual AHI.

## 2016-02-02 NOTE — Telephone Encounter (Signed)
-----   Message from Javier Glazier, MD sent at 02/02/2016  1:26 PM EDT ----- [name],  Please order a CPAP titration. He has known obstructive sleep apnea with a significant residual AHI on his compliance download. Please call the patient and inform him of this plan. Thank you.

## 2016-02-02 NOTE — Telephone Encounter (Signed)
LMOVM TO CALL BACK FOR RESULTS 

## 2016-02-03 ENCOUNTER — Telehealth: Payer: Self-pay | Admitting: *Deleted

## 2016-02-03 NOTE — Telephone Encounter (Signed)
LMTCB for pt 

## 2016-02-03 NOTE — Telephone Encounter (Signed)
Just to calrify, do you want a CPAP titration done in lab or auto setting done on pt CPAP at home for titration?

## 2016-02-03 NOTE — Telephone Encounter (Signed)
In lab please. Thanks.

## 2016-02-03 NOTE — Telephone Encounter (Signed)
SPOKE TO PT DAUGHTER ABOUT RESULTS AND VERBALIZED UNDERSTANDING 

## 2016-02-03 NOTE — Telephone Encounter (Signed)
-----   Message from [name], MD sent at 01/31/2016  1:51 PM EDT ----- Results reviewed.  Claiborne Billings, please inform pt of result. I will route to primary care also.

## 2016-02-04 NOTE — Telephone Encounter (Signed)
LMOMTCB x2 for pt 

## 2016-02-04 NOTE — Telephone Encounter (Signed)
Spouse called back and is aware of results below. CPAP titration ordered. Nothing further needed

## 2016-02-04 NOTE — Telephone Encounter (Signed)
Pt wife returning call.Stanley A Dalton' °

## 2016-02-08 ENCOUNTER — Encounter: Payer: Self-pay | Admitting: Internal Medicine

## 2016-02-09 ENCOUNTER — Ambulatory Visit (INDEPENDENT_AMBULATORY_CARE_PROVIDER_SITE_OTHER): Payer: Medicare HMO | Admitting: Adult Health

## 2016-02-09 ENCOUNTER — Encounter: Payer: Self-pay | Admitting: Internal Medicine

## 2016-02-09 ENCOUNTER — Ambulatory Visit (INDEPENDENT_AMBULATORY_CARE_PROVIDER_SITE_OTHER): Payer: Medicare HMO | Admitting: Internal Medicine

## 2016-02-09 VITALS — BP 134/84 | HR 54 | Temp 98.2°F | Ht 69.0 in | Wt 201.2 lb

## 2016-02-09 VITALS — BP 122/70 | HR 55 | Ht 69.0 in | Wt 202.4 lb

## 2016-02-09 DIAGNOSIS — N183 Chronic kidney disease, stage 3 unspecified: Secondary | ICD-10-CM

## 2016-02-09 DIAGNOSIS — I5022 Chronic systolic (congestive) heart failure: Secondary | ICD-10-CM

## 2016-02-09 DIAGNOSIS — R7989 Other specified abnormal findings of blood chemistry: Secondary | ICD-10-CM | POA: Diagnosis not present

## 2016-02-09 DIAGNOSIS — I472 Ventricular tachycardia, unspecified: Secondary | ICD-10-CM

## 2016-02-09 DIAGNOSIS — I480 Paroxysmal atrial fibrillation: Secondary | ICD-10-CM | POA: Diagnosis not present

## 2016-02-09 DIAGNOSIS — I10 Essential (primary) hypertension: Secondary | ICD-10-CM

## 2016-02-09 DIAGNOSIS — R946 Abnormal results of thyroid function studies: Secondary | ICD-10-CM | POA: Diagnosis not present

## 2016-02-09 DIAGNOSIS — Z0000 Encounter for general adult medical examination without abnormal findings: Secondary | ICD-10-CM

## 2016-02-09 DIAGNOSIS — Z23 Encounter for immunization: Secondary | ICD-10-CM

## 2016-02-09 LAB — CUP PACEART INCLINIC DEVICE CHECK
Brady Statistic RV Percent Paced: 1 % — CL
HIGH POWER IMPEDANCE MEASURED VALUE: 49 Ohm
HIGH POWER IMPEDANCE MEASURED VALUE: 53 Ohm
Implantable Lead Implant Date: 20050922
Implantable Lead Model: 158
Implantable Lead Serial Number: 156891
Lead Channel Pacing Threshold Amplitude: 0.5 V
Lead Channel Pacing Threshold Amplitude: 1 V
Lead Channel Pacing Threshold Pulse Width: 0.4 ms
Lead Channel Sensing Intrinsic Amplitude: 24.3 mV
Lead Channel Setting Pacing Pulse Width: 0.4 ms
Lead Channel Setting Sensing Sensitivity: 0.6 mV
MDC IDC LEAD IMPLANT DT: 20050922
MDC IDC LEAD LOCATION: 753859
MDC IDC LEAD LOCATION: 753860
MDC IDC MSMT LEADCHNL RA IMPEDANCE VALUE: 501 Ohm
MDC IDC MSMT LEADCHNL RA SENSING INTR AMPL: 3.7 mV
MDC IDC MSMT LEADCHNL RV IMPEDANCE VALUE: 457 Ohm
MDC IDC MSMT LEADCHNL RV PACING THRESHOLD PULSEWIDTH: 0.4 ms
MDC IDC SESS DTM: 20170920040000
MDC IDC SET LEADCHNL RA PACING AMPLITUDE: 2 V
MDC IDC SET LEADCHNL RV PACING AMPLITUDE: 2.4 V
MDC IDC STAT BRADY RA PERCENT PACED: 56 %
Pulse Gen Serial Number: 141895

## 2016-02-09 MED ORDER — SOTALOL HCL 80 MG PO TABS: 120.0000 mg | ORAL_TABLET | Freq: Every day | ORAL | 3 refills | Status: DC
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
## 2016-02-09 NOTE — Patient Instructions (Addendum)
Medication Instructions:  Your physician has recommended you make the following change in your medication:  1) Increase Sotalol to 120 mg daily --1.5 tablets daily   Labwork: Your physician recommends that you return for lab work today at PCP BMP/MAG---will have drawn at [facility]   Testing/Procedures: None ordered    Follow-Up: Your physician recommends that you schedule a follow-up appointment on Fri for an EKG and in 2 weeks with Dr Rayann Heman   Any Other Special Instructions Will Be Listed Below (If Applicable).     If you need a refill on your cardiac medications before your next appointment, please call your pharmacy.

## 2016-02-09 NOTE — Progress Notes (Signed)
Pre visit review using our clinic review tool, if applicable. No additional management support is needed unless otherwise documented below in the visit note. 

## 2016-02-09 NOTE — Patient Instructions (Signed)
It was great seeing you again today   I will follow up with you regarding your labs    As discussed, we will get you settled with cardiology first and then refer you over to Neurology   Please let me know if you need anything.
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
## 2016-02-10 ENCOUNTER — Other Ambulatory Visit (INDEPENDENT_AMBULATORY_CARE_PROVIDER_SITE_OTHER): Payer: Medicare HMO

## 2016-02-10 DIAGNOSIS — I10 Essential (primary) hypertension: Secondary | ICD-10-CM | POA: Diagnosis not present

## 2016-02-10 LAB — TSH: TSH: 2.92 u[IU]/mL (ref 0.35–4.50)

## 2016-02-10 LAB — BASIC METABOLIC PANEL
BUN: 20 mg/dL (ref 6–23)
CO2: 30 mEq/L (ref 19–32)
Calcium: 9.8 mg/dL (ref 8.4–10.5)
Chloride: 101 mEq/L (ref 96–112)
Creatinine, Ser: 2 mg/dL — ABNORMAL HIGH (ref 0.40–1.50)
GFR: 42.76 mL/min — AB (ref >60.00)
Glucose, Bld: 94 mg/dL (ref 70–99)
POTASSIUM: 4.1 meq/L (ref 3.5–5.1)
SODIUM: 139 meq/L (ref 135–145)

## 2016-02-10 LAB — MAGNESIUM: MAGNESIUM: 2 mg/dL (ref 1.5–2.5)

## 2016-02-11 ENCOUNTER — Ambulatory Visit (HOSPITAL_BASED_OUTPATIENT_CLINIC_OR_DEPARTMENT_OTHER): Payer: Medicare HMO | Attending: Pulmonary Disease | Admitting: Pulmonary Disease

## 2016-02-11 DIAGNOSIS — G4733 Obstructive sleep apnea (adult) (pediatric): Secondary | ICD-10-CM | POA: Diagnosis not present

## 2016-02-11 DIAGNOSIS — I493 Ventricular premature depolarization: Secondary | ICD-10-CM | POA: Insufficient documentation

## 2016-02-11 DIAGNOSIS — R0683 Snoring: Secondary | ICD-10-CM | POA: Diagnosis not present

## 2016-02-11 DIAGNOSIS — Z79899 Other long term (current) drug therapy: Secondary | ICD-10-CM | POA: Insufficient documentation

## 2016-02-11 DIAGNOSIS — G4730 Sleep apnea, unspecified: Secondary | ICD-10-CM | POA: Diagnosis present

## 2016-02-11 DIAGNOSIS — Z7901 Long term (current) use of anticoagulants: Secondary | ICD-10-CM | POA: Insufficient documentation

## 2016-02-14 ENCOUNTER — Ambulatory Visit (HOSPITAL_COMMUNITY)
Admission: RE | Admit: 2016-02-14 | Discharge: 2016-02-14 | Disposition: A | Discharge status: Home / Self Care | Payer: Medicare HMO | Source: Ambulatory Visit | Attending: Cardiology | Admitting: Cardiology

## 2016-02-14 ENCOUNTER — Telehealth: Payer: Self-pay | Admitting: Internal Medicine

## 2016-02-14 DIAGNOSIS — I5022 Chronic systolic (congestive) heart failure: Secondary | ICD-10-CM | POA: Insufficient documentation

## 2016-02-14 DIAGNOSIS — J8410 Pulmonary fibrosis, unspecified: Secondary | ICD-10-CM | POA: Insufficient documentation

## 2016-02-14 DIAGNOSIS — Z8673 Personal history of transient ischemic attack (TIA), and cerebral infarction without residual deficits: Secondary | ICD-10-CM | POA: Diagnosis not present

## 2016-02-14 DIAGNOSIS — I480 Paroxysmal atrial fibrillation: Secondary | ICD-10-CM | POA: Insufficient documentation

## 2016-02-14 DIAGNOSIS — I2510 Atherosclerotic heart disease of native coronary artery without angina pectoris: Secondary | ICD-10-CM | POA: Diagnosis not present

## 2016-02-14 DIAGNOSIS — G4733 Obstructive sleep apnea (adult) (pediatric): Secondary | ICD-10-CM | POA: Diagnosis not present

## 2016-02-14 DIAGNOSIS — N183 Chronic kidney disease, stage 3 (moderate): Secondary | ICD-10-CM | POA: Diagnosis not present

## 2016-02-14 DIAGNOSIS — Z95810 Presence of automatic (implantable) cardiac defibrillator: Secondary | ICD-10-CM | POA: Diagnosis not present

## 2016-02-14 MED ORDER — SACUBITRIL-VALSARTAN 24-26 MG PO TABS: 1.0000 | ORAL_TABLET | Freq: Two times a day (BID) | ORAL | 5 refills | Status: DC

## 2016-02-14 NOTE — Progress Notes (Signed)
HPI:  [name] is a  70 year old AA male with a history of ICM, chronic systolic heart failure EF 25-30% s/p ICD, VT 2016, CVA 2009, OSA, DJD, PAF  and CKD III (BL SCr 1.7-1.8).    Moved to [place] in 2013 for nephrology care. Was apparently told to stop Naprosyn and renal function improved. Relocated to [place] late December 2016.   He presents today with his wife who is very involved in his care for pharmacist-led HF medication titration.  At last HF clinic visit on 8/10, his Bidil was increased to 1 tablet TID. His wife states that he sometimes misses his noon dose. Feeling better but still SOB with moderate activity. Weight down 5 lbs since last clinic visit with increase in lasix dose to TIW from BIW per nephrology. Recently had a repeat sleep study awaiting results for CPAP titration. 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 Has been seen at [facility] by Dr. Mercy Moore with next follow up in November.     . Shortness of breath/dyspnea on exertion? Yes - Increased significantly over the last month likely 2/2 amiodarone toxicity  . Orthopnea/PND? Yes - BiPAP at night . Edema? No . Lightheadedness/dizziness? Yes - seems to be associated with runs of VT, possible ablation coming up . Daily weights at home? No  . Blood pressure/heart rate monitoring at home? Yes - has monitor, uses daily or at least every other day - says that readings have been "normal" last 125/80 mmHg (episode 155/80) . Following low-sodium/fluid-restricted diet? Yes  HF Medications: Lasix 20 mg on MWF Bidil 1 tab TID Toprol 100 mg daily   Precautions/Contraindications:  Amiodarone - ?pulmonary toxicity  Naproxen - "caused renal failure" per wife  Has the patient been experiencing any side effects to the medications prescribed?  no  Does the patient have any problems obtaining medications due to transportation or finances?   No - Medicare +  Medicaid  Understanding of regimen: good Understanding of indications: good Potential of compliance: good Patient understands to avoid NSAIDs. Patient understands to avoid decongestants.    Pertinent Lab Values: . 02/10/16: Serum creatinine 2.00 (BL 1.7-1.8), BUN 20, Potassium 4.1, Sodium 139, Magnesium 2.0  Vital Signs: . Weight: 201.8 (dry weight: 200 lb) . Blood pressure: 128/82 mmHg  . Heart rate: 55 bpm   Assessment: 1. Chronicsystolic CHF (EF 123XX123), due to ICM. NYHA class IIsymptoms.  - Volume status stable. Weight down with increase in lasix dose per nephrology.  - Continue Bidil 1 tab TID, metoprolol 100 mg daily and furosemide 20 mg on MWF  - Start Entresto 24-26 mg BID with close monitoring of renal function - Basic disease state pathophysiology, medication indication, mechanism and side effects reviewed at length with patient and he verbalized understanding 2. CAD (last cath 2005 patent cors, stress test 7/17 large no ischemia)  - Continue Crestor and metoprolol 3. PAF/VT s/p ICD   - Managed by Dr. Rayann Heman and considering ablation   - Switched from amiodarone to sotalol for ?pulmonary fibrosis  - Continue Eliquis 5 mg BID  4. CKD III   - Followed by Dr. Mercy Moore   Plan: 1) Medication changes: Based on clinical presentation, vital signs and recent labs will start Entresto 24-26 mg PO BID 2) Labs: BMET in 10 days  3) Follow-up: Dr. Haroldine Laws in 1 month    [place]. Velva Harman, PharmD, BCPS, CPP Clinical Pharmacist Pager: [phone] Phone:  [phone] 02/14/2016 2:40 PM   Agree with above.   Bensimhon, Daniel,MD 11:17 PM

## 2016-02-14 NOTE — Telephone Encounter (Signed)
New message  Pt daughter call requesting to speak with RN to reschedule pts EKG. Please call back to discuss

## 2016-02-14 NOTE — Telephone Encounter (Signed)
Rescheduled EKG

## 2016-02-14 NOTE — Patient Instructions (Signed)
Please START ENTRESTO 24-26 mg TWICE DAILY.  Please make an appointment for labs in 10 days.   Please make an appointment with Dr. Haroldine Laws in 1 month.

## 2016-02-16 DIAGNOSIS — G4733 Obstructive sleep apnea (adult) (pediatric): Secondary | ICD-10-CM | POA: Diagnosis not present

## 2016-02-16 DIAGNOSIS — G4730 Sleep apnea, unspecified: Secondary | ICD-10-CM

## 2016-02-16 NOTE — Procedures (Signed)
Patient Name: Caleb, [name] Date: 02/11/2016 Gender: Male D.O.B: 1945/06/30 Age (years): 70 Referring Provider: Javier Glazier Height (inches): 69 Interpreting Physician: Kara Mead MD, ABSM Weight (lbs): 202 RPSGT: Madelon Lips BMI: 30 MRN: [id number] Neck Size: 17.00   CLINICAL INFORMATION The patient is referred for a CPAP titration to treat sleep apnea. Download on 16 cm CPAP showed residual AHI of 22/h   SLEEP STUDY TECHNIQUE As per the AASM Manual for the Scoring of Sleep and Associated Events v2.3 (April 2016) with a hypopnea requiring 4% desaturations. The channels recorded and monitored were frontal, central and occipital EEG, electrooculogram (EOG), submentalis EMG (chin), nasal and oral airflow, thoracic and abdominal wall motion, anterior tibialis EMG, snore microphone, electrocardiogram, and pulse oximetry. Continuous positive airway pressure (CPAP) was initiated at the beginning of the study and titrated to treat sleep-disordered breathing.   MEDICATIONS Medications taken by the patient : ELIQUIS, FUROSEMIDE, LEVOCETIRIZINE, METOPROLOL SUCCINATE, TAMSULOSIN, VIMPAT  Medications administered by patient during sleep study : No sleep medicine administered.   RESPIRATORY PARAMETERS Optimal PAP Pressure (cm): 13 AHI at Optimal Pressure (/hr): 0.0 Overall Minimal O2 (%): 92.00 Supine % at Optimal Pressure (%): 25 Minimal O2 at Optimal Pressure (%): 95.0     SLEEP ARCHITECTURE The study was initiated at 10:50:49 PM and ended at 5:13:48 AM. Sleep onset time was 6.5 minutes and the sleep efficiency was 83.9%. The total sleep time was 321.5 minutes. The patient spent 7.93% of the night in stage N1 sleep, 66.72% in stage N2 sleep, 0.00% in stage N3 and 25.35% in REM.Stage REM latency was 94.5 minutes Wake after sleep onset was 55.0. Alpha intrusion was absent. Supine sleep was 28.37%.   CARDIAC DATA The 2 lead EKG demonstrated sinus rhythm. The mean heart  rate was 55.19 beats per minute. Other EKG findings include: PVCs.   LEG MOVEMENT DATA The total Periodic Limb Movements of Sleep (PLMS) were 511. The PLMS index was 95.37. A PLMS index of <15 is considered normal in adults.   IMPRESSIONS - The optimal PAP pressure was 13 cm of water. - Central sleep apnea was not noted during this titration (CAI = 0.6/h). - Significant oxygen desaturations were not observed during this titration (min O2 = 92.00%). - The patient snored with Soft snoring volume during this titration study. - 2-lead EKG demonstrated: PVCs - Severe periodic limb movements were observed during this study. Arousals associated with PLMs were significant.   DIAGNOSIS - Obstructive Sleep Apnea (327.23 [G47.33 ICD-10])   RECOMMENDATIONS - Trial of CPAP therapy on 13 cm H2O with a Medium size Fisher&Paykel Nasal Mask Eson2 mask with chin strap and heated humidification. - Avoid alcohol, sedatives and other CNS depressants that may worsen sleep apnea and disrupt normal sleep architecture. - Sleep hygiene should be reviewed to assess factors that may improve sleep quality. - Weight management and regular exercise should be initiated or continued. - Return for re-evaluation after 4 weeks of therapy    Kara Mead MD Board Certified in Sleep medicine   02/16/2016

## 2016-02-18 ENCOUNTER — Ambulatory Visit (INDEPENDENT_AMBULATORY_CARE_PROVIDER_SITE_OTHER): Payer: Medicaid Other | Admitting: Internal Medicine

## 2016-02-18 DIAGNOSIS — I472 Ventricular tachycardia, unspecified: Secondary | ICD-10-CM

## 2016-02-18 DIAGNOSIS — Z79899 Other long term (current) drug therapy: Secondary | ICD-10-CM | POA: Diagnosis not present

## 2016-02-21 ENCOUNTER — Other Ambulatory Visit: Payer: Self-pay

## 2016-02-21 DIAGNOSIS — G4733 Obstructive sleep apnea (adult) (pediatric): Secondary | ICD-10-CM

## 2016-02-21 DIAGNOSIS — Z9989 Dependence on other enabling machines and devices: Secondary | ICD-10-CM

## 2016-02-24 ENCOUNTER — Encounter: Payer: Self-pay | Admitting: Pulmonary Disease

## 2016-02-25 ENCOUNTER — Ambulatory Visit (HOSPITAL_COMMUNITY)
Admission: RE | Admit: 2016-02-25 | Discharge: 2016-02-25 | Disposition: A | Discharge status: Home / Self Care | Payer: Medicare Other | Source: Ambulatory Visit | Attending: Cardiology | Admitting: Cardiology

## 2016-02-25 DIAGNOSIS — I5022 Chronic systolic (congestive) heart failure: Secondary | ICD-10-CM | POA: Diagnosis not present

## 2016-02-25 LAB — BASIC METABOLIC PANEL
ANION GAP: 6 (ref 5–15)
BUN: 27 mg/dL — ABNORMAL HIGH (ref 6–20)
CALCIUM: 9.7 mg/dL (ref 8.9–10.3)
CO2: 27 mmol/L (ref 22–32)
Chloride: 106 mmol/L (ref 101–111)
Creatinine, Ser: 2.03 mg/dL — ABNORMAL HIGH (ref 0.61–1.24)
GFR, EST AFRICAN AMERICAN: 37 mL/min — AB (ref >60)
GFR, EST NON AFRICAN AMERICAN: 32 mL/min — AB (ref >60)
Glucose, Bld: 169 mg/dL — ABNORMAL HIGH (ref 65–99)
POTASSIUM: 4.4 mmol/L (ref 3.5–5.1)
Sodium: 139 mmol/L (ref 135–145)

## 2016-02-29 ENCOUNTER — Emergency Department (HOSPITAL_COMMUNITY): Payer: Medicare Other

## 2016-02-29 ENCOUNTER — Ambulatory Visit (INDEPENDENT_AMBULATORY_CARE_PROVIDER_SITE_OTHER): Payer: Medicaid Other | Admitting: Family Medicine

## 2016-02-29 ENCOUNTER — Telehealth: Payer: Self-pay | Admitting: Internal Medicine

## 2016-02-29 ENCOUNTER — Observation Stay (HOSPITAL_COMMUNITY)
Admission: EM | Admit: 2016-02-29 | Discharge: 2016-03-02 | Disposition: A | Discharge status: Home / Self Care | Payer: Medicare Other | Attending: Internal Medicine | Admitting: Internal Medicine

## 2016-02-29 ENCOUNTER — Encounter: Payer: Self-pay | Admitting: Family Medicine

## 2016-02-29 ENCOUNTER — Encounter (HOSPITAL_COMMUNITY): Payer: Self-pay

## 2016-02-29 VITALS — BP 118/60 | HR 69 | Temp 100.7°F | Ht 69.0 in | Wt 196.0 lb

## 2016-02-29 DIAGNOSIS — G4733 Obstructive sleep apnea (adult) (pediatric): Secondary | ICD-10-CM | POA: Diagnosis not present

## 2016-02-29 DIAGNOSIS — I5023 Acute on chronic systolic (congestive) heart failure: Secondary | ICD-10-CM | POA: Diagnosis not present

## 2016-02-29 DIAGNOSIS — I252 Old myocardial infarction: Secondary | ICD-10-CM | POA: Insufficient documentation

## 2016-02-29 DIAGNOSIS — R531 Weakness: Secondary | ICD-10-CM | POA: Diagnosis not present

## 2016-02-29 DIAGNOSIS — Z79899 Other long term (current) drug therapy: Secondary | ICD-10-CM | POA: Insufficient documentation

## 2016-02-29 DIAGNOSIS — I472 Ventricular tachycardia: Secondary | ICD-10-CM | POA: Diagnosis not present

## 2016-02-29 DIAGNOSIS — R569 Unspecified convulsions: Secondary | ICD-10-CM | POA: Diagnosis not present

## 2016-02-29 DIAGNOSIS — I255 Ischemic cardiomyopathy: Secondary | ICD-10-CM | POA: Diagnosis not present

## 2016-02-29 DIAGNOSIS — I130 Hypertensive heart and chronic kidney disease with heart failure and stage 1 through stage 4 chronic kidney disease, or unspecified chronic kidney disease: Secondary | ICD-10-CM | POA: Diagnosis not present

## 2016-02-29 DIAGNOSIS — N400 Enlarged prostate without lower urinary tract symptoms: Secondary | ICD-10-CM | POA: Diagnosis not present

## 2016-02-29 DIAGNOSIS — I2510 Atherosclerotic heart disease of native coronary artery without angina pectoris: Secondary | ICD-10-CM | POA: Insufficient documentation

## 2016-02-29 DIAGNOSIS — R509 Fever, unspecified: Secondary | ICD-10-CM | POA: Diagnosis not present

## 2016-02-29 DIAGNOSIS — I208 Other forms of angina pectoris: Secondary | ICD-10-CM | POA: Diagnosis not present

## 2016-02-29 DIAGNOSIS — I480 Paroxysmal atrial fibrillation: Secondary | ICD-10-CM | POA: Insufficient documentation

## 2016-02-29 DIAGNOSIS — R1013 Epigastric pain: Secondary | ICD-10-CM | POA: Diagnosis not present

## 2016-02-29 DIAGNOSIS — Z9109 Other allergy status, other than to drugs and biological substances: Secondary | ICD-10-CM | POA: Insufficient documentation

## 2016-02-29 DIAGNOSIS — R0600 Dyspnea, unspecified: Secondary | ICD-10-CM | POA: Diagnosis not present

## 2016-02-29 DIAGNOSIS — Z8673 Personal history of transient ischemic attack (TIA), and cerebral infarction without residual deficits: Secondary | ICD-10-CM | POA: Diagnosis not present

## 2016-02-29 DIAGNOSIS — Z7901 Long term (current) use of anticoagulants: Secondary | ICD-10-CM | POA: Diagnosis not present

## 2016-02-29 DIAGNOSIS — N183 Chronic kidney disease, stage 3 (moderate): Secondary | ICD-10-CM | POA: Insufficient documentation

## 2016-02-29 DIAGNOSIS — R5381 Other malaise: Secondary | ICD-10-CM | POA: Diagnosis not present

## 2016-02-29 DIAGNOSIS — I10 Essential (primary) hypertension: Secondary | ICD-10-CM

## 2016-02-29 DIAGNOSIS — Z87891 Personal history of nicotine dependence: Secondary | ICD-10-CM | POA: Insufficient documentation

## 2016-02-29 DIAGNOSIS — R0602 Shortness of breath: Secondary | ICD-10-CM | POA: Diagnosis not present

## 2016-02-29 DIAGNOSIS — R072 Precordial pain: Secondary | ICD-10-CM | POA: Diagnosis not present

## 2016-02-29 DIAGNOSIS — I5042 Chronic combined systolic (congestive) and diastolic (congestive) heart failure: Secondary | ICD-10-CM | POA: Diagnosis not present

## 2016-02-29 DIAGNOSIS — Z95810 Presence of automatic (implantable) cardiac defibrillator: Secondary | ICD-10-CM | POA: Diagnosis not present

## 2016-02-29 DIAGNOSIS — E559 Vitamin D deficiency, unspecified: Secondary | ICD-10-CM | POA: Insufficient documentation

## 2016-02-29 DIAGNOSIS — R079 Chest pain, unspecified: Secondary | ICD-10-CM | POA: Diagnosis not present

## 2016-02-29 DIAGNOSIS — I482 Chronic atrial fibrillation: Secondary | ICD-10-CM | POA: Insufficient documentation

## 2016-02-29 DIAGNOSIS — E785 Hyperlipidemia, unspecified: Secondary | ICD-10-CM | POA: Insufficient documentation

## 2016-02-29 LAB — URINE MICROSCOPIC-ADD ON: RBC / HPF: NONE SEEN RBC/hpf (ref 0–5)

## 2016-02-29 LAB — CBC WITH DIFFERENTIAL/PLATELET
Basophils Absolute: 0 K/uL (ref 0.0–0.1)
Basophils Relative: 0 %
Eosinophils Absolute: 0.1 K/uL (ref 0.0–0.7)
Eosinophils Relative: 1 %
HCT: 39.8 % (ref 39.0–52.0)
Hemoglobin: 13.5 g/dL (ref 13.0–17.0)
Lymphocytes Relative: 20 %
Lymphs Abs: 1.6 K/uL (ref 0.7–4.0)
MCH: 30.9 pg (ref 26.0–34.0)
MCHC: 33.9 g/dL (ref 30.0–36.0)
MCV: 91.1 fL (ref 78.0–100.0)
Monocytes Absolute: 1.2 K/uL — ABNORMAL HIGH (ref 0.1–1.0)
Monocytes Relative: 16 %
Neutro Abs: 5 K/uL (ref 1.7–7.7)
Neutrophils Relative %: 63 %
Platelets: 214 K/uL (ref 150–400)
RBC: 4.37 MIL/uL (ref 4.22–5.81)
RDW: 13.7 % (ref 11.5–15.5)
WBC: 7.9 K/uL (ref 4.0–10.5)

## 2016-02-29 LAB — COMPREHENSIVE METABOLIC PANEL WITH GFR
ALT: 22 U/L (ref 17–63)
AST: 27 U/L (ref 15–41)
Albumin: 3.9 g/dL (ref 3.5–5.0)
Alkaline Phosphatase: 35 U/L — ABNORMAL LOW (ref 38–126)
Anion gap: 7 (ref 5–15)
BUN: 17 mg/dL (ref 6–20)
CO2: 24 mmol/L (ref 22–32)
Calcium: 9.3 mg/dL (ref 8.9–10.3)
Chloride: 105 mmol/L (ref 101–111)
Creatinine, Ser: 1.92 mg/dL — ABNORMAL HIGH (ref 0.61–1.24)
GFR calc Af Amer: 39 mL/min — ABNORMAL LOW
GFR calc non Af Amer: 34 mL/min — ABNORMAL LOW
Glucose, Bld: 177 mg/dL — ABNORMAL HIGH (ref 65–99)
Potassium: 3.9 mmol/L (ref 3.5–5.1)
Sodium: 136 mmol/L (ref 135–145)
Total Bilirubin: 0.7 mg/dL (ref 0.3–1.2)
Total Protein: 6.8 g/dL (ref 6.5–8.1)

## 2016-02-29 LAB — I-STAT CHEM 8, ED
BUN: 21 mg/dL — AB (ref 6–20)
CALCIUM ION: 1.15 mmol/L (ref 1.15–1.40)
CREATININE: 1.9 mg/dL — AB (ref 0.61–1.24)
Chloride: 103 mmol/L (ref 101–111)
GLUCOSE: 173 mg/dL — AB (ref 65–99)
HEMATOCRIT: 40 % (ref 39.0–52.0)
HEMOGLOBIN: 13.6 g/dL (ref 13.0–17.0)
Potassium: 3.9 mmol/L (ref 3.5–5.1)
Sodium: 138 mmol/L (ref 135–145)
TCO2: 25 mmol/L (ref 0–100)

## 2016-02-29 LAB — I-STAT CG4 LACTIC ACID, ED: Lactic Acid, Venous: 1.78 mmol/L (ref 0.5–1.9)

## 2016-02-29 LAB — URINALYSIS, ROUTINE W REFLEX MICROSCOPIC
BILIRUBIN URINE: NEGATIVE
Glucose, UA: NEGATIVE mg/dL
Hgb urine dipstick: NEGATIVE
KETONES UR: 15 mg/dL — AB
LEUKOCYTES UA: NEGATIVE
NITRITE: NEGATIVE
PH: 6.5 (ref 5.0–8.0)
PROTEIN: 30 mg/dL — AB
Specific Gravity, Urine: 1.03 (ref 1.005–1.030)

## 2016-02-29 LAB — I-STAT TROPONIN, ED: Troponin i, poc: 0.01 ng/mL (ref 0.00–0.08)

## 2016-02-29 LAB — BRAIN NATRIURETIC PEPTIDE: B NATRIURETIC PEPTIDE 5: 177.4 pg/mL — AB (ref 0.0–100.0)
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
## 2016-02-29 NOTE — ED Notes (Signed)
Dr. Rogene Houston made aware of reports from [employer] representative about patient's interrogation.

## 2016-02-29 NOTE — ED Notes (Signed)
Pt remains chest pain free at this time °

## 2016-02-29 NOTE — Telephone Encounter (Signed)
[name] is calling to let you know that his PCP is sending him to the E/R  , because he is very short of breath balance and he is really cold .   Thanks

## 2016-02-29 NOTE — ED Notes (Signed)
Pt requesting something to eat at this time.  Will notify MD of pt's request.

## 2016-02-29 NOTE — Progress Notes (Signed)
Pre visit review using our clinic review tool, if applicable. No additional management support is needed unless otherwise documented below in the visit note. 

## 2016-02-29 NOTE — ED Notes (Signed)
Report called to Blanch Media, RN at this time.  Receiving nurse denies having any further questions at this time.

## 2016-02-29 NOTE — Progress Notes (Signed)
Patient came in to room 2W04 from [facility] per streecher  accompanied by family,put on bed comfortably and attached to cardiac monitor.Will continue to monitor.
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
## 2016-02-29 NOTE — ED Notes (Signed)
Pt provided with a boxed lunch at this time per Md.  Pt in no apparent distress at this time. Will continue to closely monitor pt.

## 2016-02-29 NOTE — ED Notes (Signed)
[employer] ICD interrogated, [name] from [employer] contacted this RN and stated that patient has been in NSR with no episodes present on his device.

## 2016-02-29 NOTE — ED Triage Notes (Signed)
Patient comes by EMS for central chest pain.  Patient was given 1 NITRO and 4 BABY ASA.  Denies any pain at this time.  Patient A&Ox4, no obvious signs of distress.

## 2016-03-01 DIAGNOSIS — I420 Dilated cardiomyopathy: Secondary | ICD-10-CM

## 2016-03-01 DIAGNOSIS — R42 Dizziness and giddiness: Secondary | ICD-10-CM | POA: Diagnosis not present

## 2016-03-01 DIAGNOSIS — I472 Ventricular tachycardia: Secondary | ICD-10-CM | POA: Diagnosis not present

## 2016-03-01 DIAGNOSIS — R072 Precordial pain: Secondary | ICD-10-CM

## 2016-03-01 DIAGNOSIS — R509 Fever, unspecified: Secondary | ICD-10-CM | POA: Diagnosis not present

## 2016-03-01 LAB — BASIC METABOLIC PANEL
ANION GAP: 7 (ref 5–15)
BUN: 15 mg/dL (ref 6–20)
CHLORIDE: 104 mmol/L (ref 101–111)
CO2: 27 mmol/L (ref 22–32)
Calcium: 8.9 mg/dL (ref 8.9–10.3)
Creatinine, Ser: 1.78 mg/dL — ABNORMAL HIGH (ref 0.61–1.24)
GFR calc non Af Amer: 37 mL/min — ABNORMAL LOW (ref >60)
GFR, EST AFRICAN AMERICAN: 43 mL/min — AB (ref >60)
Glucose, Bld: 112 mg/dL — ABNORMAL HIGH (ref 65–99)
POTASSIUM: 3.7 mmol/L (ref 3.5–5.1)
SODIUM: 138 mmol/L (ref 135–145)

## 2016-03-01 LAB — CBC
HCT: 38.2 % — ABNORMAL LOW (ref 39.0–52.0)
HEMOGLOBIN: 12.9 g/dL — AB (ref 13.0–17.0)
MCH: 30.9 pg (ref 26.0–34.0)
MCHC: 33.8 g/dL (ref 30.0–36.0)
MCV: 91.4 fL (ref 78.0–100.0)
Platelets: 195 10*3/uL (ref 150–400)
RBC: 4.18 MIL/uL — ABNORMAL LOW (ref 4.22–5.81)
RDW: 13.7 % (ref 11.5–15.5)
WBC: 8.7 10*3/uL (ref 4.0–10.5)

## 2016-03-01 LAB — TROPONIN I
TROPONIN I: 0.04 ng/mL — AB (ref <0.03)
TROPONIN I: 0.04 ng/mL — AB (ref <0.03)
Troponin I: 0.06 ng/mL (ref <0.03)

## 2016-03-01 LAB — URINE CULTURE

## 2016-03-01 LAB — LIPID PANEL
CHOL/HDL RATIO: 4.7 ratio
CHOLESTEROL: 113 mg/dL (ref 0–200)
HDL: 24 mg/dL — AB (ref >40)
LDL CALC: 70 mg/dL (ref 0–99)
TRIGLYCERIDES: 95 mg/dL (ref <150)
VLDL: 19 mg/dL (ref 0–40)

## 2016-03-01 MED ORDER — ASPIRIN 300 MG RE SUPP: 300.0000 mg | RECTAL | Status: AC

## 2016-03-01 MED ORDER — SOTALOL HCL 80 MG PO TABS
40.0000 mg | ORAL_TABLET | Freq: Every day | ORAL | Status: DC
  Administered 2016-03-01 – 2016-03-02 (×2): 40 mg via ORAL
  Filled 2016-03-01 (×2): qty 1

## 2016-03-01 MED ORDER — NITROGLYCERIN 0.4 MG SL SUBL: 0.4000 mg | SUBLINGUAL_TABLET | SUBLINGUAL | Status: DC | PRN

## 2016-03-01 MED ORDER — ASPIRIN 81 MG PO CHEW: 324.0000 mg | CHEWABLE_TABLET | ORAL | Status: AC

## 2016-03-01 MED ORDER — APIXABAN 5 MG PO TABS
5.0000 mg | ORAL_TABLET | Freq: Two times a day (BID) | ORAL | Status: DC
  Administered 2016-03-01 – 2016-03-02 (×4): 5 mg via ORAL
  Filled 2016-03-01 (×4): qty 1

## 2016-03-01 MED ORDER — LACOSAMIDE 50 MG PO TABS
50.0000 mg | ORAL_TABLET | Freq: Two times a day (BID) | ORAL | Status: DC
  Administered 2016-03-01 – 2016-03-02 (×4): 50 mg via ORAL
  Filled 2016-03-01 (×4): qty 1

## 2016-03-01 MED ORDER — LORATADINE 10 MG PO TABS
10.0000 mg | ORAL_TABLET | Freq: Every evening | ORAL | Status: DC
  Administered 2016-03-01: 10 mg via ORAL
  Filled 2016-03-01: qty 1

## 2016-03-01 MED ORDER — SOTALOL HCL 80 MG PO TABS
80.0000 mg | ORAL_TABLET | Freq: Every day | ORAL | Status: DC
  Administered 2016-03-01 (×2): 80 mg via ORAL
  Filled 2016-03-01 (×2): qty 1

## 2016-03-01 MED ORDER — FENOFIBRATE 160 MG PO TABS
160.0000 mg | ORAL_TABLET | Freq: Every evening | ORAL | Status: DC
  Administered 2016-03-01: 160 mg via ORAL
  Filled 2016-03-01: qty 1

## 2016-03-01 MED ORDER — SACUBITRIL-VALSARTAN 24-26 MG PO TABS
1.0000 | ORAL_TABLET | Freq: Two times a day (BID) | ORAL | Status: DC
  Administered 2016-03-01 – 2016-03-02 (×4): 1 via ORAL
  Filled 2016-03-01 (×5): qty 1

## 2016-03-01 MED ORDER — FUROSEMIDE 20 MG PO TABS
20.0000 mg | ORAL_TABLET | ORAL | Status: DC
  Administered 2016-03-01: 20 mg via ORAL
  Filled 2016-03-01: qty 1

## 2016-03-01 MED ORDER — ASPIRIN EC 81 MG PO TBEC
81.0000 mg | DELAYED_RELEASE_TABLET | Freq: Every day | ORAL | Status: DC
  Administered 2016-03-02: 81 mg via ORAL
  Filled 2016-03-01: qty 1

## 2016-03-01 MED ORDER — ACETAMINOPHEN 325 MG PO TABS
650.0000 mg | ORAL_TABLET | ORAL | Status: DC | PRN
  Administered 2016-03-01: 650 mg via ORAL
  Filled 2016-03-01: qty 2

## 2016-03-01 MED ORDER — ROSUVASTATIN CALCIUM 10 MG PO TABS
40.0000 mg | ORAL_TABLET | Freq: Every day | ORAL | Status: DC
  Administered 2016-03-01 (×2): 40 mg via ORAL
  Filled 2016-03-01 (×3): qty 4

## 2016-03-01 MED ORDER — MIRABEGRON ER 25 MG PO TB24
25.0000 mg | ORAL_TABLET | Freq: Every day | ORAL | Status: DC
  Administered 2016-03-01 – 2016-03-02 (×2): 25 mg via ORAL
  Filled 2016-03-01 (×2): qty 1

## 2016-03-01 MED ORDER — TAMSULOSIN HCL 0.4 MG PO CAPS
0.4000 mg | ORAL_CAPSULE | Freq: Every day | ORAL | Status: DC
  Administered 2016-03-02: 0.4 mg via ORAL
  Filled 2016-03-01 (×3): qty 1

## 2016-03-01 MED ORDER — METOPROLOL SUCCINATE ER 100 MG PO TB24
100.0000 mg | ORAL_TABLET | Freq: Every evening | ORAL | Status: DC
  Administered 2016-03-01 (×2): 100 mg via ORAL
  Filled 2016-03-01 (×3): qty 1

## 2016-03-01 MED ORDER — ONDANSETRON HCL 4 MG/2ML IJ SOLN: 4.0000 mg | Freq: Four times a day (QID) | INTRAMUSCULAR | Status: DC | PRN

## 2016-03-01 MED ORDER — ISOSORB DINITRATE-HYDRALAZINE 20-37.5 MG PO TABS
1.0000 | ORAL_TABLET | Freq: Three times a day (TID) | ORAL | Status: DC
  Administered 2016-03-01 – 2016-03-02 (×4): 1 via ORAL
  Filled 2016-03-01 (×4): qty 1

## 2016-03-01 MED ORDER — FINASTERIDE 5 MG PO TABS
5.0000 mg | ORAL_TABLET | Freq: Every day | ORAL | Status: DC
  Administered 2016-03-01 – 2016-03-02 (×2): 5 mg via ORAL
  Filled 2016-03-01 (×2): qty 1

## 2016-03-01 NOTE — Progress Notes (Signed)
Set pt up on Cpap 10 cm h20 tolerating well.

## 2016-03-01 NOTE — Care Management Note (Signed)
Case Management Note  Patient Details  Name: [name] MRN: [id number] Date of Birth: [birth year]  Subjective/Objective:   70 y.o. M admitted to [facility] after developing SOB and CP during PCP OV for fever 101.6. He had been experiencing weakness and had stumbled while there which had concerned his wife of 43 yrs. Plan is for him to have VT ablation. Followed by Dr Rayann Heman and in CHF clinic. Reports he has never had [facility] but his wife would make the decision for which agency he will use and he will relay this information to CM on Thursday 03/02/2016.                  Action/Plan: Will continue to follow.    Expected Discharge Date:                  Expected Discharge Plan:  Home/Self Care  In-House Referral:  NA  Discharge planning Services  CM Consult  Post Acute Care Choice:  NA Choice offered to:  Patient  DME Arranged:    DME Agency:     HH Arranged:    [place] Agency:     Status of Service:  In process, will continue to follow  If discussed at Long Length of Stay Meetings, dates discussed:    Additional Comments:  Delrae Sawyers, RN 03/01/2016, 4:15 PM

## 2016-03-01 NOTE — Care Management Obs Status (Signed)
[facility] NOTIFICATION   Patient Details  Name: [name] MRN: [id number] Date of Birth: 1946/05/20   Medicare Observation Status Notification Given:  Yes    CrutchfieldAntony Haste, RN 03/01/2016, 4:04 PM

## 2016-03-01 NOTE — Progress Notes (Signed)
At 4:30 am called about positive troponin of 0.06, Crea 1.78, GFR 37, the patient was febrile earlier.  This is possibly sec to demand ischemia, however chest pain earlier, we will continue to cycle troponin, if flat trend consistent with demand ischemia, no heparin as he is on Eliquis.  Ena Dawley 03/01/2016

## 2016-03-01 NOTE — Progress Notes (Signed)
Patient Name: [name] Date of Encounter: 03/01/2016  Primary Cardiologist: Dr. Rayann Heman, Dr. [facility] Problem List     Active Problems:   Chest pain   Precordial chest pain     Subjective   Feels well, wants to go home now. Denies chest pain, endorses SOB and orthopnea, this is his baseline. Has multiple questions about VT ablation.   Inpatient Medications    Scheduled Meds: . apixaban  5 mg Oral BID  . aspirin  324 mg Oral NOW   Or  . aspirin  300 mg Rectal NOW  . [START ON 03/02/2016] aspirin EC  81 mg Oral Daily  . fenofibrate  160 mg Oral QPM  . finasteride  5 mg Oral Daily  . furosemide  20 mg Oral Once per day on Mon Wed Fri  . isosorbide-hydrALAZINE  1 tablet Oral TID  . lacosamide  50 mg Oral BID  . loratadine  10 mg Oral QPM  . metoprolol succinate  100 mg Oral QPM  . mirabegron ER  25 mg Oral Daily  . rosuvastatin  40 mg Oral q1800  . sacubitril-valsartan  1 tablet Oral BID  . sotalol  40 mg Oral Daily  . sotalol  80 mg Oral QHS  . tamsulosin  0.4 mg Oral Daily   Continuous Infusions:  PRN Meds:.acetaminophen, nitroGLYCERIN, ondansetron (ZOFRAN) IV   Vital Signs    Vitals:   02/29/16 2313 03/01/16 0030 03/01/16 0118 03/01/16 0457  BP: (!) 157/62   127/75  Pulse: 70  82 63  Resp: (!) 21   18  Temp: (!) 101.6 F (38.7 C) 99.2 F (37.3 C)  99 F (37.2 C)  TempSrc: Oral Oral  Oral  SpO2: 98%  97% 98%  Weight: 196 lb 10.4 oz (89.2 kg)     Height: 5\' 9"  (1.753 m)      No intake or output data in the 24 hours ending 03/01/16 0749 Filed Weights   02/29/16 2313  Weight: 196 lb 10.4 oz (89.2 kg)    Physical Exam   GEN: Well nourished, well developed, in no acute distress.  HEENT: Grossly normal.  Neck: Supple, no JVD, carotid bruits, or masses. Cardiac: RRR, no murmurs, rubs, or gallops. No clubbing, cyanosis, edema.  Radials/DP/PT 2+ and equal bilaterally.  Respiratory:  Respirations regular and unlabored, clear to  auscultation bilaterally. GI: Soft, nontender, nondistended, BS + x 4. MS: no deformity or atrophy. Skin: warm and dry, no rash. Neuro:  Strength and sensation are intact. Psych: AAOx3.  Normal affect.  Labs    CBC  Recent Labs  02/29/16 1727 02/29/16 1815 03/01/16 0301  WBC 7.9  --  8.7  NEUTROABS 5.0  --   --   HGB 13.5 13.6 12.9*  HCT 39.8 40.0 38.2*  MCV 91.1  --  91.4  PLT 214  --  0000000   Basic Metabolic Panel  Recent Labs  02/29/16 1727 02/29/16 1815 03/01/16 0301  NA 136 138 138  K 3.9 3.9 3.7  CL 105 103 104  CO2 24  --  27  GLUCOSE 177* 173* 112*  BUN 17 21* 15  CREATININE 1.92* 1.90* 1.78*  CALCIUM 9.3  --  8.9   Liver Function Tests  Recent Labs  02/29/16 1727  AST 27  ALT 22  ALKPHOS 35*  BILITOT 0.7  PROT 6.8  ALBUMIN 3.9   Cardiac Enzymes  Recent Labs  03/01/16 0301  TROPONINI 0.06*   Fasting Lipid  Panel  Recent Labs  03/01/16 0301  CHOL 113  HDL 24*  LDLCALC 70  TRIG 95  CHOLHDL 4.7     Telemetry    NSR, atrial paced.- Personally Reviewed  ECG     NSR with incomplete LBBB- Personally Reviewed  Radiology    Dg Chest Port 1 View  Result Date: 02/29/2016 CLINICAL DATA:  Chest pain. EXAM: PORTABLE CHEST 1 VIEW COMPARISON:  12/22/2015 FINDINGS: The heart size and mediastinal contours are within normal limits. Stable appearance of cardiac pacing/ICD device. There is no evidence of pulmonary edema, consolidation, pneumothorax, nodule or pleural fluid. The visualized skeletal structures are unremarkable. IMPRESSION: No active disease. Electronically Signed   By: Aletta Edouard M.D.   On: 02/29/2016 17:06     Cardiac Studies  Transthoracic Echocardiography 08/2015  - Left ventricle: Septal , apical and inferior wall hypokinesis The   cavity size was moderately dilated. Wall thickness was increased   in a pattern of mild LVH. Systolic function was severely reduced.   The estimated ejection fraction was in the range of  25% to 30%. - Mitral valve: Eccentric posteriorly directed MR likely ischemic.   There was mild to moderate regurgitation. - Left atrium: The atrium was moderately dilated. - Atrial septum: No defect or patent foramen ovale was identified. - Pulmonary arteries: PA peak pressure: 32 mm Hg (S).   Patient Profile  Mr. Swasey is a 70 year old male with a past medical history of ICM (EF 25-30%), chronic combined systolic and diastolic CHF, atrial fibrillation (on Eliquis), CVA in 2009, HTN, HLD, CKD stage III, and VT s/p dual chamber ICD. He was not feeling well on Monday 02/27/16, and had some GI illness on Monday night. Yesterday on Tuesday, he was feeling fatigued and felt like he had a fever, so he went to his PCP. While at his PCP he developed chest pain with SOB. EMS was called to his PCP's office. His chest pain lasted about 4 hours, EKG showed NSR, with T wave inversion in the inferior leads which is consistent with his previous EKG's.   Assessment & Plan    1. Elevated troponin and chest pain with moderate risk of cardiac etiology: He has a known history of remote MI in 1986, with PCI to his circumflex, last cath was in 2005 with normal cors. He had a Lexiscan Myoview in July 2017 that shows a large scar in the basal inferior, basal inferolateral, mid inferior, and apical inferior wall. No evidence of ischemia.   His chief compliant is chest pain, which has not resolved, his troponin is elevated at 0.06, however he has known CKD and reduced EF. Elevation in troponin likely represents demand ischemia and not ACS.   Would follow troponin trend, if flat trend would defer ischemic evaluation.   2. History of VT: s/p dual chamber ICD. Patient was recently seen by Dr. Rayann Heman who increased his sotalol to 120mg  daily. 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 He would like to proceed with ablation. He has an appt. With Dr. Rayann Heman on Monday 10/16.   3. Ischemic cardiomyopathy: BNP  was only slightly elevated at 170 and he does not appear volume overloaded on exam. Would continue his current meds.   4. PAF: No longer on Amiodarone as he has ILD, followed by pulmonary.   This patients CHA2DS2-VASc Score and unadjusted Ischemic Stroke Rate (% per year) is equal to 11.2 % stroke rate/year from a score of  7 Above score calculated as 1 point each if present [CHF, HTN, DM, Vascular=MI/PAD/Aortic Plaque, Age if 65-74, or Male], 2 points each if present [Age > 75, or Stroke/TIA/TE]  Continue Eliquis.    Signed, Arbutus Leas, NP  03/01/2016, 7:49 AM   I have seen, examined and evaluated the patient this AM along with Jettie Booze, NP.  After reviewing all the available data and chart, we discussed the patients laboratory, study & physical findings as well as symptoms in detail. I agree with her findings, examination as well as impression recommendations as per our discussion.    Patient feels better today. No further episodes of chest pain. Biggest concern the family has is rounding around his chronic dyspnea, and fatigue. Had lots of questions about the potential for VT ablation. I spent some time discussing with him potential benefits of VT ablation. Hopefully VT ablation would allow [place] to not have to use sotalol -- which may help with his fatigue.  As for his current admission, I'm not convinced that his episode yesterday is anginal in nature. He has only had one troponin which was mildly elevated. I suspect this is probably more demand mediated since he has signs of mild volume depletion. I suspect that his low-grade fever is a viral syndrome. No dysuria, chest x-ray is clear. No skin lesions to speak of. UA is essentially clear - culture pending. Blood cultures were drawn.  He seems if anything a little bit dehydrated. I asked him to liberalize his fluids today. I think he can probably be a little more liberal with his fluid intake and then use more Lasix as needed.  For now  would be to follow-up cardiac markers to determine if there was any potential signs of ischemia. He did not take nitroglycerin yesterday, and actually felt more dizzy and lightheaded than he actually had chest pain. Leery of using long-acting nitrate simply because it may worsen his dizziness and orthostatic symptoms.  He is on a combination of beta blocker and sotalol, this clearly could be leading to his fatigue and even potential orthostatic symptoms. He is also on low-dose Entresto. Only these medications alone for now, but I suspect the benefit of VT ablation will be to obviate the need for sotalol.  Monitor today while following cardiac enzymes and signs of potential infection. Otherwise would expect that he could be discharged later on today or tomorrow morning.   Glenetta Hew, M.D., M.S. Interventional Cardiologist   Pager # [phone] Phone # [phone] [address] [place], [place] [address]

## 2016-03-02 DIAGNOSIS — I472 Ventricular tachycardia: Secondary | ICD-10-CM | POA: Diagnosis not present

## 2016-03-02 DIAGNOSIS — R42 Dizziness and giddiness: Secondary | ICD-10-CM | POA: Diagnosis not present

## 2016-03-02 DIAGNOSIS — R072 Precordial pain: Secondary | ICD-10-CM | POA: Diagnosis not present

## 2016-03-02 NOTE — Discharge Instructions (Signed)

## 2016-03-02 NOTE — Progress Notes (Signed)
Order received to discharge patient.  Patient expresses readiness to discharge.  Discharge instructions, follow up, medications and instructions for their use were discussed with patient and patient voiced understanding.  Telemetry monitor was removed and CCMD notified.  PIV access was removed without complication.

## 2016-03-02 NOTE — Care Management Note (Signed)
Case Management Note  Patient Details  Name: Gayland Siverling MRN: [id number] Date of Birth: 09-Sep-1945  Subjective/Objective: Anticipate Home today with no CM needs.                     Action/Plan:Discharge.    Expected Discharge Date:                  Expected Discharge Plan:  Home/Self Care  In-House Referral:  NA  Discharge planning Services  CM Consult  Post Acute Care Choice:  NA Choice offered to:  Patient  DME Arranged:    DME Agency:     HH Arranged:    [place] Agency:     Status of Service:  Completed, signed off  If discussed at [employer] of Stay Meetings, dates discussed:    Additional Comments:  Delrae Sawyers, RN 03/02/2016, 1:34 PM

## 2016-03-02 NOTE — Discharge Summary (Signed)
Discharge Summary    [name],  MRN: [id number], DOB/AGE: 69/01/1946 69 y.o.  Admit date: 02/29/2016 Discharge date: 03/02/2016  Primary Care Provider: Dorothyann Peng Primary Cardiologist: Dr. Allred/Dr. Haroldine Laws  Discharge Diagnoses    Active Problems:   Chest pain   Precordial chest pain   Allergies Allergies  Allergen Reactions  . Aricept [Donepezil Hcl] Other (See Comments)    Worsens renal function  . Codeine Hives  . Nexium [Esomeprazole] Other (See Comments)    Severely worsens renal function  . Omeprazole Hives  . Pantoprazole Sodium Other (See Comments)    Renal failure  . Tramadol Nausea And Vomiting    Diagnostic Studies/Procedures  None  _____________   History of Present Illness   [name] is a 70 y.o. male with a history of ICM, chronic systolic heart failure EF 25-30% s/p ICD, VT 2016, CVA 2009, OSA, DJD, PAF (on Eliquis) and CKD III (BL SCr 1.7-1.8).He has been followed by Dr Rayann Heman for recurrent VTs and also followed in the CHF clinic for CHF medical optimization.   Dr Rayann Heman saw him on 02/09/16 for a symptomatic episode of VT recently for which he received appropriate ICD ATP therapy.He was started on sotalol for anti-arrhythmic therapy. He declined ablation at the time of his appointment, but has recently expressed that he would like to pursue ablation (He has an appointment with Dr. Rayann Heman on 03/06/16).   He presented to the ED on 02/29/16 after visiting his PCP's office and reporting chest pain. He was at his PCP's office on 02/29/16 as he had been feeling generally unwell for 2 days and had some diarrhea. He developed retrosternal chest tightness that lasted an hour with no DOE, no dizziness, no ICD firing or syncope, while sitting in the office. EMS was called.   ICD interrogation in the ED showed no VF/VT and no ICD firing. He was admitted for further evaluation of his chest pain and weakness.    Hospital Course  His  EKG showed atrial pacing. Troponin was mildly elevated at 0.06 with a flat trend. It was felt that his symptoms were not representative of ACS. Likely he was volume depleted from the recent diarrhea and he takes po Lasix daily.  He was gently hydrated and was chest pain free throughout admission. No VT or VF was seen on telemetry. His renal function was stable at baseline at the time of discharge.   He had multiple questions about VT ablation and was advised to keep his appointment with Dr. Rayann Heman in 4 days.   He was seen today by Dr. Ellyn Hack and deemed suitable for discharge. No changes to his medications were made.    _____________  Discharge Vitals Blood pressure 127/74, pulse (!) 58, temperature 100 F (37.8 C), temperature source Oral, resp. rate 20, height 5\' 9"  (1.753 m), weight 196 lb 10.4 oz (89.2 kg), SpO2 95 %.  Filed Weights   02/29/16 2313  Weight: 196 lb 10.4 oz (89.2 kg)    Physical Exam GEN: Well nourished, well developed, in no acute distress.  HEENT: Grossly normal.  Neck: Supple, no JVD, carotid bruits, or masses. Cardiac: RRR, no murmurs, rubs, or gallops. No clubbing, cyanosis, edema.  Radials/DP/PT 2+ and equal bilaterally.  Respiratory:  Respirations regular and unlabored, clear to auscultation bilaterally. GI: Soft, nontender, nondistended, BS + x 4. MS: no deformity or atrophy. Skin: warm and dry, no rash. Neuro:  Strength and sensation are intact. Psych: AAOx3.  Normal affect   Labs & Radiologic Studies     CBC  Recent Labs  02/29/16 1727 02/29/16 1815 03/01/16 0301  WBC 7.9  --  8.7  NEUTROABS 5.0  --   --   HGB 13.5 13.6 12.9*  HCT 39.8 40.0 38.2*  MCV 91.1  --  91.4  PLT 214  --  0000000   Basic Metabolic Panel  Recent Labs  02/29/16 1727 02/29/16 1815 03/01/16 0301  NA 136 138 138  K 3.9 3.9 3.7  CL 105 103 104  CO2 24  --  27  GLUCOSE 177* 173* 112*  BUN 17 21* 15  CREATININE 1.92* 1.90* 1.78*  CALCIUM 9.3  --  8.9   Liver  Function Tests  Recent Labs  02/29/16 1727  AST 27  ALT 22  ALKPHOS 35*  BILITOT 0.7  PROT 6.8  ALBUMIN 3.9   Cardiac Enzymes  Recent Labs  03/01/16 0301 03/01/16 1657 03/01/16 2211  TROPONINI 0.06* 0.04* 0.04*   Fasting Lipid Panel  Recent Labs  03/01/16 0301  CHOL 113  HDL 24*  LDLCALC 70  TRIG 95  CHOLHDL 4.7    Dg Chest Port 1 View  Result Date: 02/29/2016 CLINICAL DATA:  Chest pain. EXAM: PORTABLE CHEST 1 VIEW COMPARISON:  12/22/2015 FINDINGS: The heart size and mediastinal contours are within normal limits. Stable appearance of cardiac pacing/ICD device. There is no evidence of pulmonary edema, consolidation, pneumothorax, nodule or pleural fluid. The visualized skeletal structures are unremarkable. IMPRESSION: No active disease. Electronically Signed   By: Aletta Edouard M.D.   On: 02/29/2016 17:06    Disposition   Pt is being discharged home today in good condition.  Follow-up Plans & Appointments       Discharge Medications   Current Discharge Medication List    CONTINUE these medications which have NOT CHANGED   Details  apixaban (ELIQUIS) 5 MG TABS tablet Take 1 tablet (5 mg total) by mouth 2 (two) times daily. Qty: 60 tablet, Refills: 3   Associated Diagnoses: Chronic atrial fibrillation ([name])    Blood Pressure Monitoring (BLOOD PRESSURE CUFF) MISC 1 each by Does not apply route daily. Qty: 1 each, Refills: 0   Associated Diagnoses: Essential hypertension    Cholecalciferol (VITAMIN D3) 10000 units capsule Take 10,000 Units by mouth every morning.     fenofibrate 160 MG tablet Take 1 tablet (160 mg total) by mouth daily. Qty: 90 tablet, Refills: 4   Associated Diagnoses: Hyperlipidemia    finasteride (PROSCAR) 5 MG tablet take 1 tablet by mouth once daily Qty: 30 tablet, Refills: 3   Associated Diagnoses: BPH (benign prostatic hyperplasia)    furosemide (LASIX) 20 MG tablet Take 20 mg by mouth 3 (three) times a week. Monday,  Wednesday, and Friday    HYDROCODONE-GUAIFENESIN PO Take 5 mLs by mouth every 12 (twelve) hours as needed (for congestion).     isosorbide-hydrALAZINE (BIDIL) 20-37.5 MG tablet Take 1 tablet by mouth 3 (three) times daily. Qty: 90 tablet, Refills: 3    levocetirizine (XYZAL) 5 MG tablet take 1 tablet by mouth every evening Qty: 30 tablet, Refills: 2    metoprolol succinate (TOPROL-XL) 100 MG 24 hr tablet Take 100 mg by mouth every evening. Take with or immediately following a meal.     MYRBETRIQ 25 MG TB24 tablet take 1 tablet by mouth once daily Qty: 30 tablet, Refills: 2    nitroGLYCERIN (NITROSTAT) 0.4 MG SL tablet Place 0.4 mg under the tongue  every 5 (five) minutes x 3 doses as needed for chest pain.    rosuvastatin (CRESTOR) 40 MG tablet take 1 tablet by mouth once daily Qty: 90 tablet, Refills: 2    sacubitril-valsartan (ENTRESTO) 24-26 MG Take 1 tablet by mouth 2 (two) times daily. Qty: 60 tablet, Refills: 5    sotalol (BETAPACE) 80 MG tablet Take 1.5 tablets (120 mg total) by mouth daily. Qty: 135 tablet, Refills: 3    tamsulosin (FLOMAX) 0.4 MG CAPS capsule take 1 capsule by mouth once daily Qty: 30 capsule, Refills: 2    VIMPAT 50 MG TABS tablet take 1 tablet by mouth twice a day Qty: 180 tablet, Refills: 2           Outstanding Labs/Studies    Duration of Discharge Encounter   Greater than 30 minutes including physician time.  Signed, Arbutus Leas NP 03/02/2016, 8:51 AM    I saw on a lipase this morning. He is feeling good with no further symptoms of chest discomfort or dizziness. Still has a very low-grade fever but no symptoms of anything now. No dysuria. No cough. I think he must that symptom of viral GI syndrome based on his initial symptoms. He does have blood cultures pending but will follow. His troponin levels were basically a flat trend it would not be suggestive of acute on event. At this point I think is probably stable to discharge and  follow-up as scheduled with Dr. Rayann Heman on Monday morning.  I agree with the discharge summary noted above.   Glenetta Hew, M.D., M.S. Interventional Cardiologist   Pager # [phone] Phone # [phone] [address] [place], [place] [address]

## 2016-03-05 LAB — CULTURE, BLOOD (ROUTINE X 2)
CULTURE: NO GROWTH
Culture: NO GROWTH

## 2016-03-06 ENCOUNTER — Encounter: Payer: Self-pay | Admitting: Internal Medicine

## 2016-03-06 ENCOUNTER — Ambulatory Visit (INDEPENDENT_AMBULATORY_CARE_PROVIDER_SITE_OTHER): Payer: Medicare Other | Admitting: Internal Medicine

## 2016-03-06 VITALS — BP 122/74 | HR 55 | Ht 69.0 in | Wt 195.6 lb

## 2016-03-06 DIAGNOSIS — I480 Paroxysmal atrial fibrillation: Secondary | ICD-10-CM

## 2016-03-06 DIAGNOSIS — I5022 Chronic systolic (congestive) heart failure: Secondary | ICD-10-CM | POA: Diagnosis not present

## 2016-03-06 DIAGNOSIS — I472 Ventricular tachycardia, unspecified: Secondary | ICD-10-CM

## 2016-03-06 NOTE — Patient Instructions (Signed)
Medication Instructions:  Your physician has recommended you make the following change in your medication:  1) Decrease Metoprolol to 50 mg daily   Labwork: None ordered   Testing/Procedures: None ordered   Follow-Up: Your physician recommends that you schedule a follow-up appointment in: 4 weeks with Dr Rayann Heman   Any Other Special Instructions Will Be Listed Below (If Applicable).     If you need a refill on your cardiac medications before your next appointment, please call your pharmacy.

## 2016-03-06 NOTE — Progress Notes (Signed)
Patient in for EKG
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
## 2016-03-14 ENCOUNTER — Telehealth (HOSPITAL_COMMUNITY): Payer: Self-pay | Admitting: Vascular Surgery

## 2016-03-14 NOTE — Telephone Encounter (Signed)
Returned pt wife called to schedule pt for a later time on 10/26

## 2016-03-15 ENCOUNTER — Ambulatory Visit (INDEPENDENT_AMBULATORY_CARE_PROVIDER_SITE_OTHER)
Admission: RE | Admit: 2016-03-15 | Discharge: 2016-03-15 | Disposition: A | Discharge status: Home / Self Care | Payer: Medicare Other | Source: Ambulatory Visit | Attending: Adult Health | Admitting: Adult Health

## 2016-03-15 ENCOUNTER — Other Ambulatory Visit: Payer: Self-pay | Admitting: Adult Health

## 2016-03-15 DIAGNOSIS — R9389 Abnormal findings on diagnostic imaging of other specified body structures: Secondary | ICD-10-CM

## 2016-03-15 DIAGNOSIS — R938 Abnormal findings on diagnostic imaging of other specified body structures: Secondary | ICD-10-CM | POA: Diagnosis not present

## 2016-03-16 ENCOUNTER — Encounter (HOSPITAL_COMMUNITY): Payer: Self-pay | Admitting: Internal Medicine

## 2016-03-16 ENCOUNTER — Ambulatory Visit (HOSPITAL_COMMUNITY)
Admission: RE | Admit: 2016-03-16 | Discharge: 2016-03-16 | Disposition: A | Discharge status: Home / Self Care | Payer: Medicare Other | Source: Ambulatory Visit | Attending: Internal Medicine | Admitting: Internal Medicine

## 2016-03-16 VITALS — BP 110/60 | HR 69 | Wt 197.8 lb

## 2016-03-16 DIAGNOSIS — I472 Ventricular tachycardia, unspecified: Secondary | ICD-10-CM

## 2016-03-16 DIAGNOSIS — Z8673 Personal history of transient ischemic attack (TIA), and cerebral infarction without residual deficits: Secondary | ICD-10-CM | POA: Diagnosis not present

## 2016-03-16 DIAGNOSIS — Z87891 Personal history of nicotine dependence: Secondary | ICD-10-CM | POA: Insufficient documentation

## 2016-03-16 DIAGNOSIS — N183 Chronic kidney disease, stage 3 unspecified: Secondary | ICD-10-CM

## 2016-03-16 DIAGNOSIS — I255 Ischemic cardiomyopathy: Secondary | ICD-10-CM | POA: Insufficient documentation

## 2016-03-16 DIAGNOSIS — K219 Gastro-esophageal reflux disease without esophagitis: Secondary | ICD-10-CM | POA: Insufficient documentation

## 2016-03-16 DIAGNOSIS — G4733 Obstructive sleep apnea (adult) (pediatric): Secondary | ICD-10-CM | POA: Diagnosis not present

## 2016-03-16 DIAGNOSIS — Z7901 Long term (current) use of anticoagulants: Secondary | ICD-10-CM | POA: Insufficient documentation

## 2016-03-16 DIAGNOSIS — I2510 Atherosclerotic heart disease of native coronary artery without angina pectoris: Secondary | ICD-10-CM | POA: Insufficient documentation

## 2016-03-16 DIAGNOSIS — I130 Hypertensive heart and chronic kidney disease with heart failure and stage 1 through stage 4 chronic kidney disease, or unspecified chronic kidney disease: Secondary | ICD-10-CM | POA: Diagnosis not present

## 2016-03-16 DIAGNOSIS — I252 Old myocardial infarction: Secondary | ICD-10-CM | POA: Diagnosis not present

## 2016-03-16 DIAGNOSIS — I480 Paroxysmal atrial fibrillation: Secondary | ICD-10-CM | POA: Diagnosis not present

## 2016-03-16 DIAGNOSIS — I5022 Chronic systolic (congestive) heart failure: Secondary | ICD-10-CM

## 2016-03-16 DIAGNOSIS — M1990 Unspecified osteoarthritis, unspecified site: Secondary | ICD-10-CM | POA: Diagnosis not present

## 2016-03-16 DIAGNOSIS — Z95810 Presence of automatic (implantable) cardiac defibrillator: Secondary | ICD-10-CM | POA: Insufficient documentation

## 2016-03-16 DIAGNOSIS — E785 Hyperlipidemia, unspecified: Secondary | ICD-10-CM | POA: Insufficient documentation

## 2016-03-16 MED ORDER — SACUBITRIL-VALSARTAN 49-51 MG PO TABS: 1.0000 | ORAL_TABLET | Freq: Two times a day (BID) | ORAL | 6 refills | Status: DC
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
## 2016-03-16 NOTE — Addendum Note (Signed)
Encounter addended by: Scarlette Calico, RN on: 03/16/2016 10:04 AM<BR>    Actions taken: Sign clinical note

## 2016-03-16 NOTE — Patient Instructions (Addendum)
Increase Entresto to 49/51 mg Twice daily   Your physician recommends that you schedule a follow-up appointment in: 2 months

## 2016-03-16 NOTE — Addendum Note (Signed)
Encounter addended by: Scarlette Calico, RN on: 03/16/2016 10:03 AM<BR>    Actions taken: Order Entry activity accessed, Sign clinical note

## 2016-03-20 ENCOUNTER — Telehealth: Payer: Self-pay | Admitting: Pulmonary Disease

## 2016-03-20 NOTE — Telephone Encounter (Signed)
I called and spoke with [name] and they stated that the phone numbers that they had for this pt were for [facility].  They have not been able to contact the pt.  I gave the pts number listed in this phone note to her and she will pass these along to respiratory to get in contact with the pt to set up a time for the pt to get the cpap.  I attempted to call both number and no answer for either.

## 2016-03-21 ENCOUNTER — Telehealth: Payer: Self-pay | Admitting: Pulmonary Disease

## 2016-03-21 NOTE — Telephone Encounter (Signed)
Called and spoke with pts wife and she stated that she still has not heard back from [facility] for the pts cpap.  She stated that she will call them and I advised her to call us back if any issues.  Will hold message open for now.

## 2016-03-21 NOTE — Telephone Encounter (Signed)
Pt wife called would like to speak about CPAP issues/concerns. Contact # [phone] at an appointment, can reach later, and please leave a message.[name]

## 2016-03-21 NOTE — Telephone Encounter (Signed)
lmomtcb x 1 for pts wife.

## 2016-03-23 NOTE — Telephone Encounter (Signed)
lmtcb for pt's wife 

## 2016-03-24 ENCOUNTER — Ambulatory Visit (HOSPITAL_BASED_OUTPATIENT_CLINIC_OR_DEPARTMENT_OTHER): Payer: Medicare HMO

## 2016-03-28 ENCOUNTER — Other Ambulatory Visit (HOSPITAL_COMMUNITY): Payer: Self-pay | Admitting: *Deleted

## 2016-03-28 ENCOUNTER — Telehealth (HOSPITAL_COMMUNITY): Payer: Self-pay | Admitting: Vascular Surgery

## 2016-03-28 ENCOUNTER — Ambulatory Visit (INDEPENDENT_AMBULATORY_CARE_PROVIDER_SITE_OTHER): Payer: Medicare Other | Admitting: Adult Health

## 2016-03-28 ENCOUNTER — Encounter: Payer: Self-pay | Admitting: Adult Health

## 2016-03-28 VITALS — BP 140/80 | HR 68 | Temp 98.5°F | Resp 20 | Ht 69.0 in | Wt 198.0 lb

## 2016-03-28 DIAGNOSIS — S0501XA Injury of conjunctiva and corneal abrasion without foreign body, right eye, initial encounter: Secondary | ICD-10-CM

## 2016-03-28 MED ORDER — ERYTHROMYCIN 5 MG/GM OP OINT: TOPICAL_OINTMENT | OPHTHALMIC | 0 refills | Status: DC

## 2016-03-28 MED ORDER — SOTALOL HCL 80 MG PO TABS: 80.0000 mg | ORAL_TABLET | Freq: Every day | ORAL | 3 refills | Status: DC

## 2016-03-28 MED ORDER — LEVOCETIRIZINE DIHYDROCHLORIDE 5 MG PO TABS: 5.0000 mg | ORAL_TABLET | Freq: Every evening | ORAL | 5 refills | Status: DC

## 2016-03-28 NOTE — Telephone Encounter (Signed)
Pt wife called, DB increased pt Sotalol, pt is running out of this medication,the pharmacy will not refill with out new prescription .Marland Kitchen Please advise

## 2016-03-28 NOTE — Telephone Encounter (Signed)
lmtcb X2 for pt's wife.  
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
## 2016-03-28 NOTE — Patient Instructions (Addendum)
It was great seeing you today.   You have a corneal abrasion.   I have sent in a prescription for erythromycin eye ointment. Apply this 4 times a day for the next 5 days.   Follow up if no improvement   Corneal Abrasion The cornea is the clear covering at the front and center of the eye. When looking at the colored portion of the eye (iris), you are looking through the cornea. This very thin tissue is made up of many layers. The surface layer is a single layer of cells (corneal epithelium) and is one of the most sensitive tissues in the body. If a scratch or injury causes the corneal epithelium to come off, it is called a corneal abrasion. If the injury extends to the tissues below the epithelium, the condition is called a corneal ulcer. CAUSES   Scratches.  Trauma.  Foreign body in the eye. Some people have recurrences of abrasions in the area of the original injury even after it has healed (recurrent erosion syndrome). Recurrent erosion syndrome generally improves and goes away with time. SYMPTOMS   Eye pain.  Difficulty or inability to keep the injured eye open.  The eye becomes very sensitive to light.  Recurrent erosions tend to happen suddenly, first thing in the morning, usually after waking up and opening the eye. DIAGNOSIS  Your health care provider can diagnose a corneal abrasion during an eye exam. Dye is usually placed in the eye using a drop or a small paper strip moistened by your tears. When the eye is examined with a special light, the abrasion shows up clearly because of the dye. TREATMENT   Small abrasions may be treated with antibiotic drops or ointment alone.  A pressure patch may be put over the eye. If this is done, follow your doctor's instructions for when to remove the patch. Do not drive or use machines while the eye patch is on. Judging distances is hard to do with a patch on. If the abrasion becomes infected and spreads to the deeper tissues of the  cornea, a corneal ulcer can result. This is serious because it can cause corneal scarring. Corneal scars interfere with light passing through the cornea and cause a loss of vision in the involved eye. HOME CARE INSTRUCTIONS  Use medicine or ointment as directed. Only take over-the-counter or prescription medicines for pain, discomfort, or fever as directed by your health care provider.  Do not drive or operate machinery if your eye is patched. Your ability to judge distances is impaired.  If your health care provider has given you a follow-up appointment, it is very important to keep that appointment. Not keeping the appointment could result in a severe eye infection or permanent loss of vision. If there is any problem keeping the appointment, let your health care provider know. SEEK MEDICAL CARE IF:   You have pain, light sensitivity, and a scratchy feeling in one eye or both eyes.  Your pressure patch keeps loosening up, and you can blink your eye under the patch after treatment.  Any kind of discharge develops from the eye after treatment or if the lids stick together in the morning.  You have the same symptoms in the morning as you did with the original abrasion days, weeks, or months after the abrasion healed.   This information is not intended to replace advice given to you by your health care provider. Make sure you discuss any questions you have with your health care  provider.   Document Released: 05/05/2000 Document Revised: 01/27/2015 Document Reviewed: 01/13/2013 Elsevier Interactive Patient Education [employer].

## 2016-03-28 NOTE — Progress Notes (Signed)
Pre visit review using our clinic review tool, if applicable. No additional management support is needed unless otherwise documented below in the visit note. 

## 2016-03-29 NOTE — Telephone Encounter (Signed)
lmtcb x3 for pt's wife. Per triage protocol, message will be closed.

## 2016-04-04 ENCOUNTER — Telehealth (HOSPITAL_COMMUNITY): Payer: Self-pay | Admitting: Pharmacist

## 2016-04-04 NOTE — Telephone Encounter (Signed)
Eliquis 5 mg BID PA approved by [employer] 05/21/17.   Ruta Hinds. Velva Harman, PharmD, BCPS, CPP Clinical Pharmacist Pager: [phone] Phone: [phone] 04/04/2016 9:31 AM

## 2016-04-05 ENCOUNTER — Ambulatory Visit (INDEPENDENT_AMBULATORY_CARE_PROVIDER_SITE_OTHER): Payer: Medicare Other | Admitting: Adult Health

## 2016-04-05 ENCOUNTER — Encounter: Payer: Self-pay | Admitting: Adult Health

## 2016-04-05 ENCOUNTER — Ambulatory Visit (INDEPENDENT_AMBULATORY_CARE_PROVIDER_SITE_OTHER)
Admission: RE | Admit: 2016-04-05 | Discharge: 2016-04-05 | Disposition: A | Discharge status: Home / Self Care | Payer: Medicare Other | Source: Ambulatory Visit | Attending: Adult Health | Admitting: Adult Health

## 2016-04-05 VITALS — BP 126/70 | HR 72 | Temp 98.1°F | Ht 69.0 in | Wt 199.5 lb

## 2016-04-05 DIAGNOSIS — R05 Cough: Secondary | ICD-10-CM

## 2016-04-05 DIAGNOSIS — R059 Cough, unspecified: Secondary | ICD-10-CM

## 2016-04-05 MED ORDER — HYDROCODONE-HOMATROPINE 5-1.5 MG/5ML PO SYRP: 5.0000 mL | ORAL_SOLUTION | Freq: Three times a day (TID) | ORAL | 0 refills | Status: DC | PRN
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
## 2016-04-06 ENCOUNTER — Other Ambulatory Visit: Payer: Self-pay | Admitting: Adult Health

## 2016-04-06 ENCOUNTER — Telehealth: Payer: Self-pay | Admitting: Adult Health

## 2016-04-06 MED ORDER — PREDNISONE 10 MG PO TABS: ORAL_TABLET | ORAL | 0 refills | Status: DC

## 2016-04-06 NOTE — Telephone Encounter (Signed)
Updated on results of chest x ray- negative for pneumonia.   Will call in prednisone taper to help with his cough

## 2016-04-10 ENCOUNTER — Encounter: Payer: Self-pay | Admitting: Internal Medicine

## 2016-04-10 ENCOUNTER — Ambulatory Visit (INDEPENDENT_AMBULATORY_CARE_PROVIDER_SITE_OTHER): Payer: Medicare Other | Admitting: Internal Medicine

## 2016-04-10 VITALS — BP 138/80 | HR 56 | Ht 69.0 in | Wt 200.4 lb

## 2016-04-10 DIAGNOSIS — I480 Paroxysmal atrial fibrillation: Secondary | ICD-10-CM

## 2016-04-10 DIAGNOSIS — I472 Ventricular tachycardia, unspecified: Secondary | ICD-10-CM

## 2016-04-10 DIAGNOSIS — N183 Chronic kidney disease, stage 3 unspecified: Secondary | ICD-10-CM

## 2016-04-10 DIAGNOSIS — I5022 Chronic systolic (congestive) heart failure: Secondary | ICD-10-CM

## 2016-04-10 NOTE — Patient Instructions (Signed)
Medication Instructions:  Your physician recommends that you continue on your current medications as directed. Please refer to the Current Medication list given to you today.   Labwork: None ordered   Testing/Procedures: None ordered   Follow-Up: Your physician recommends that you schedule a follow-up appointment in: 6 weeks with Chanetta Marshall, NP  Your physician wants you to follow-up in: 6 months with Dr Rayann Heman Dennis Bast will receive a reminder letter in the mail two months in advance. If you don't receive a letter, please call our office to schedule the follow-up appointment.    Any Other Special Instructions Will Be Listed Below (If Applicable).     If you need a refill on your cardiac medications before your next appointment, please call your pharmacy.
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
## 2016-04-16 ENCOUNTER — Other Ambulatory Visit: Payer: Self-pay | Admitting: Adult Health

## 2016-05-02 ENCOUNTER — Ambulatory Visit (INDEPENDENT_AMBULATORY_CARE_PROVIDER_SITE_OTHER): Payer: Medicare Other | Admitting: Pulmonary Disease

## 2016-05-02 ENCOUNTER — Encounter: Payer: Self-pay | Admitting: Pulmonary Disease

## 2016-05-02 VITALS — BP 118/64 | HR 58 | Ht 69.0 in | Wt 199.8 lb

## 2016-05-02 DIAGNOSIS — G4733 Obstructive sleep apnea (adult) (pediatric): Secondary | ICD-10-CM | POA: Diagnosis not present

## 2016-05-02 DIAGNOSIS — J849 Interstitial pulmonary disease, unspecified: Secondary | ICD-10-CM

## 2016-05-02 DIAGNOSIS — Z9989 Dependence on other enabling machines and devices: Secondary | ICD-10-CM | POA: Diagnosis not present

## 2016-05-02 DIAGNOSIS — J439 Emphysema, unspecified: Secondary | ICD-10-CM

## 2016-05-02 DIAGNOSIS — R0600 Dyspnea, unspecified: Secondary | ICD-10-CM

## 2016-05-02 LAB — PULMONARY FUNCTION TEST
DL/VA % PRED: 74 %
DL/VA: 3.43 ml/min/mmHg/L
DLCO COR % PRED: 59 %
DLCO COR: 19.13 ml/min/mmHg
DLCO unc % pred: 56 %
DLCO unc: 18.33 ml/min/mmHg
FEF 25-75 PRE: 2.31 L/s
FEF2575-%Pred-Pre: 92 %
FEV1-%Pred-Pre: 94 %
FEV1-Pre: 2.75 L
FEV1FVC-%Pred-Pre: 98 %
FEV6-%Pred-Pre: 98 %
FEV6-Pre: 3.61 L
FEV6FVC-%Pred-Pre: 102 %
FVC-%PRED-PRE: 95 %
FVC-Pre: 3.67 L
PRE FEV6/FVC RATIO: 99 %
Pre FEV1/FVC ratio: 75 %

## 2016-05-02 MED ORDER — BUDESONIDE-FORMOTEROL FUMARATE 160-4.5 MCG/ACT IN AERO: 2.0000 | INHALATION_SPRAY | Freq: Two times a day (BID) | RESPIRATORY_TRACT | 0 refills | Status: DC

## 2016-05-02 MED ORDER — AEROCHAMBER MV MISC: 0 refills | Status: DC

## 2016-05-02 NOTE — Progress Notes (Signed)
Test reviewed.  
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
## 2016-05-02 NOTE — Patient Instructions (Signed)
   We are going to try you on Symbicort. Inhale 2 puffs twice daily with your spacer. Remember to rinse, gargle, spit, and brush your tongue afterward to keep from getting thrush.  Call me for a prescription for the Symbicort if it seems to help your cough & breathing.  I'm going to have you see one of my partners that specializes in sleep medicine to help with your sleep apnea and leg jerking while you're sleeping.  I will see you back in 3 months or sooner if needed. Call me if you start to have any new breathing problems.   TESTS ORDERED: 1. Spirometry with DLCO at next appointment 2. 6MWT on room air at next appointment

## 2016-05-03 ENCOUNTER — Ambulatory Visit (INDEPENDENT_AMBULATORY_CARE_PROVIDER_SITE_OTHER): Payer: Medicare Other | Admitting: Adult Health

## 2016-05-03 ENCOUNTER — Encounter: Payer: Self-pay | Admitting: Adult Health

## 2016-05-03 ENCOUNTER — Ambulatory Visit (INDEPENDENT_AMBULATORY_CARE_PROVIDER_SITE_OTHER)
Admission: RE | Admit: 2016-05-03 | Discharge: 2016-05-03 | Disposition: A | Discharge status: Home / Self Care | Payer: Medicare Other | Source: Ambulatory Visit | Attending: Adult Health | Admitting: Adult Health

## 2016-05-03 VITALS — BP 128/70 | Temp 98.3°F | Ht 69.0 in | Wt 200.0 lb

## 2016-05-03 DIAGNOSIS — S79921A Unspecified injury of right thigh, initial encounter: Secondary | ICD-10-CM

## 2016-05-03 DIAGNOSIS — S79911A Unspecified injury of right hip, initial encounter: Secondary | ICD-10-CM

## 2016-05-03 DIAGNOSIS — W19XXXA Unspecified fall, initial encounter: Secondary | ICD-10-CM

## 2016-05-03 NOTE — Progress Notes (Signed)
Pre visit review using our clinic review tool, if applicable. No additional management support is needed unless otherwise documented below in the visit note. 
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
## 2016-05-04 ENCOUNTER — Telehealth: Payer: Self-pay | Admitting: Adult Health

## 2016-05-04 ENCOUNTER — Ambulatory Visit: Payer: Medicare Other | Admitting: Adult Health

## 2016-05-04 NOTE — Telephone Encounter (Signed)
° ° ° °  Pt call to say Tommi Rumps called him last night and is returning his call

## 2016-05-04 NOTE — Telephone Encounter (Signed)
See below

## 2016-05-09 ENCOUNTER — Ambulatory Visit: Payer: Medicare Other | Attending: Adult Health | Admitting: Physical Therapy

## 2016-05-09 DIAGNOSIS — R2689 Other abnormalities of gait and mobility: Secondary | ICD-10-CM | POA: Diagnosis not present

## 2016-05-09 DIAGNOSIS — R2681 Unsteadiness on feet: Secondary | ICD-10-CM | POA: Insufficient documentation

## 2016-05-09 DIAGNOSIS — M6281 Muscle weakness (generalized): Secondary | ICD-10-CM
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
## 2016-05-09 NOTE — Patient Instructions (Signed)
Fall Prevention in the Home Falls can cause injuries and can affect people from all age groups. There are many simple things that you can do to make your home safe and to help prevent falls. What can I do on the outside of my home?  Regularly repair the edges of walkways and driveways and fix any cracks.  Remove high doorway thresholds.  Trim any shrubbery on the main path into your home.  Use bright outdoor lighting.  Clear walkways of debris and clutter, including tools and rocks.  Regularly check that handrails are securely fastened and in good repair. Both sides of any steps should have handrails.  Install guardrails along the edges of any raised decks or porches.  Have leaves, snow, and ice cleared regularly.  Use sand or salt on walkways during winter months.  In the garage, clean up any spills right away, including grease or oil spills. What can I do in the bathroom?  Use night lights.  Install grab bars by the toilet and in the tub and shower. Do not use towel bars as grab bars.  Use non-skid mats or decals on the floor of the tub or shower.  If you need to sit down while you are in the shower, use a plastic, non-slip stool.  Keep the floor dry. Immediately clean up any water that spills on the floor.  Remove soap buildup in the tub or shower on a regular basis.  Attach bath mats securely with double-sided non-slip rug tape.  Remove throw rugs and other tripping hazards from the floor. What can I do in the bedroom?  Use night lights.  Make sure that a bedside light is easy to reach.  Do not use oversized bedding that drapes onto the floor.  Have a firm chair that has side arms to use for getting dressed.  Remove throw rugs and other tripping hazards from the floor. What can I do in the kitchen?  Clean up any spills right away.  Avoid walking on wet floors.  Place frequently used items in easy-to-reach places.  If you need to reach for something above  you, use a sturdy step stool that has a grab bar.  Keep electrical cables out of the way.  Do not use floor polish or wax that makes floors slippery. If you have to use wax, make sure that it is non-skid floor wax.  Remove throw rugs and other tripping hazards from the floor. What can I do in the stairways?  Do not leave any items on the stairs.  Make sure that there are handrails on both sides of the stairs. Fix handrails that are broken or loose. Make sure that handrails are as long as the stairways.  Check any carpeting to make sure that it is firmly attached to the stairs. Fix any carpet that is loose or worn.  Avoid having throw rugs at the top or bottom of stairways, or secure the rugs with carpet tape to prevent them from moving.  Make sure that you have a light switch at the top of the stairs and the bottom of the stairs. If you do not have them, have them installed. What are some other fall prevention tips?  Wear closed-toe shoes that fit well and support your feet. Wear shoes that have rubber soles or low heels.  When you use a stepladder, make sure that it is completely opened and that the sides are firmly locked. Have someone hold the ladder while you are using   it. Do not climb a closed stepladder.  Add color or contrast paint or tape to grab bars and handrails in your home. Place contrasting color strips on the first and last steps.  Use mobility aids as needed, such as canes, walkers, scooters, and crutches.  Turn on lights if it is dark. Replace any light bulbs that burn out.  Set up furniture so that there are clear paths. Keep the furniture in the same spot.  Fix any uneven floor surfaces.  Choose a carpet design that does not hide the edge of steps of a stairway.  Be aware of any and all pets.  Review your medicines with your healthcare provider. Some medicines can cause dizziness or changes in blood pressure, which increase your risk of falling. Talk with  your health care provider about other ways that you can decrease your risk of falls. This may include working with a physical therapist or trainer to improve your strength, balance, and endurance. This information is not intended to replace advice given to you by your health care provider. Make sure you discuss any questions you have with your health care provider. Document Released: 04/28/2002 Document Revised: 10/05/2015 Document Reviewed: 06/12/2014 Elsevier Interactive Patient Education  2017 [employer]  

## 2016-05-16 ENCOUNTER — Ambulatory Visit (HOSPITAL_COMMUNITY)
Admission: RE | Admit: 2016-05-16 | Discharge: 2016-05-16 | Disposition: A | Discharge status: Home / Self Care | Payer: Medicare Other | Source: Ambulatory Visit | Attending: Internal Medicine | Admitting: Internal Medicine

## 2016-05-16 VITALS — BP 130/81 | HR 67 | Wt 200.5 lb

## 2016-05-16 DIAGNOSIS — G4733 Obstructive sleep apnea (adult) (pediatric): Secondary | ICD-10-CM | POA: Insufficient documentation

## 2016-05-16 DIAGNOSIS — I4729 Other ventricular tachycardia: Secondary | ICD-10-CM

## 2016-05-16 DIAGNOSIS — I472 Ventricular tachycardia: Secondary | ICD-10-CM | POA: Diagnosis not present

## 2016-05-16 DIAGNOSIS — Z7901 Long term (current) use of anticoagulants: Secondary | ICD-10-CM | POA: Diagnosis not present

## 2016-05-16 DIAGNOSIS — Z87891 Personal history of nicotine dependence: Secondary | ICD-10-CM | POA: Diagnosis not present

## 2016-05-16 DIAGNOSIS — I255 Ischemic cardiomyopathy: Secondary | ICD-10-CM | POA: Insufficient documentation

## 2016-05-16 DIAGNOSIS — I5022 Chronic systolic (congestive) heart failure: Secondary | ICD-10-CM | POA: Diagnosis present

## 2016-05-16 DIAGNOSIS — E785 Hyperlipidemia, unspecified: Secondary | ICD-10-CM | POA: Diagnosis not present

## 2016-05-16 DIAGNOSIS — K219 Gastro-esophageal reflux disease without esophagitis: Secondary | ICD-10-CM | POA: Diagnosis not present

## 2016-05-16 DIAGNOSIS — M1990 Unspecified osteoarthritis, unspecified site: Secondary | ICD-10-CM | POA: Diagnosis not present

## 2016-05-16 DIAGNOSIS — Z8673 Personal history of transient ischemic attack (TIA), and cerebral infarction without residual deficits: Secondary | ICD-10-CM | POA: Diagnosis not present

## 2016-05-16 DIAGNOSIS — I2510 Atherosclerotic heart disease of native coronary artery without angina pectoris: Secondary | ICD-10-CM

## 2016-05-16 DIAGNOSIS — I130 Hypertensive heart and chronic kidney disease with heart failure and stage 1 through stage 4 chronic kidney disease, or unspecified chronic kidney disease: Secondary | ICD-10-CM | POA: Diagnosis not present

## 2016-05-16 DIAGNOSIS — M19019 Primary osteoarthritis, unspecified shoulder: Secondary | ICD-10-CM | POA: Diagnosis not present

## 2016-05-16 DIAGNOSIS — Z95810 Presence of automatic (implantable) cardiac defibrillator: Secondary | ICD-10-CM | POA: Insufficient documentation

## 2016-05-16 DIAGNOSIS — N183 Chronic kidney disease, stage 3 unspecified: Secondary | ICD-10-CM

## 2016-05-16 DIAGNOSIS — I480 Paroxysmal atrial fibrillation: Secondary | ICD-10-CM | POA: Insufficient documentation

## 2016-05-16 DIAGNOSIS — I252 Old myocardial infarction: Secondary | ICD-10-CM | POA: Insufficient documentation

## 2016-05-16 DIAGNOSIS — I4891 Unspecified atrial fibrillation: Secondary | ICD-10-CM

## 2016-05-16 LAB — BASIC METABOLIC PANEL
Anion gap: 4 — ABNORMAL LOW (ref 5–15)
BUN: 16 mg/dL (ref 6–20)
CHLORIDE: 108 mmol/L (ref 101–111)
CO2: 26 mmol/L (ref 22–32)
CREATININE: 1.45 mg/dL — AB (ref 0.61–1.24)
Calcium: 9.5 mg/dL (ref 8.9–10.3)
GFR calc non Af Amer: 48 mL/min — ABNORMAL LOW (ref >60)
GFR, EST AFRICAN AMERICAN: 55 mL/min — AB (ref >60)
GLUCOSE: 143 mg/dL — AB (ref 65–99)
Potassium: 4.3 mmol/L (ref 3.5–5.1)
Sodium: 138 mmol/L (ref 135–145)

## 2016-05-16 LAB — BRAIN NATRIURETIC PEPTIDE: B Natriuretic Peptide: 96 pg/mL (ref 0.0–100.0)

## 2016-05-16 MED ORDER — ROSUVASTATIN CALCIUM 40 MG PO TABS: 40.0000 mg | ORAL_TABLET | Freq: Every evening | ORAL | 3 refills | Status: DC

## 2016-05-16 MED ORDER — SACUBITRIL-VALSARTAN 97-103 MG PO TABS: 1.0000 | ORAL_TABLET | Freq: Two times a day (BID) | ORAL | 3 refills | Status: DC

## 2016-05-16 MED ORDER — FUROSEMIDE 20 MG PO TABS: 20.0000 mg | ORAL_TABLET | ORAL | 3 refills | Status: DC

## 2016-05-16 MED ORDER — FINASTERIDE 5 MG PO TABS: 5.0000 mg | ORAL_TABLET | Freq: Every day | ORAL | 3 refills | Status: DC

## 2016-05-16 NOTE — Patient Instructions (Signed)
Refills for 90 day supply sent in for: Proscar Lasix Crestor  INCREASE Entresto to 97/103 mg tablet: Take 1 tablet twice daily.  Routine lab work today. Will notify you of abnormal results, otherwise no news is good news!  Repeat labs in 2 weeks.  Follow up 3 months with Dr. Haroldine Laws.  Will refer you to social worker Raquel Sarna for financial assistance programs.  Do the following things EVERYDAY: 1) Weigh yourself in the morning before breakfast. Write it down and keep it in a log. 2) Take your medicines as prescribed 3) Eat low salt foods-Limit salt (sodium) to 2000 mg per day.  4) Stay as active as you can everyday 5) Limit all fluids for the day to less than 2 liters
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
## 2016-05-17 ENCOUNTER — Ambulatory Visit: Payer: Medicare Other | Admitting: Physical Therapy

## 2016-05-17 DIAGNOSIS — R2681 Unsteadiness on feet: Secondary | ICD-10-CM

## 2016-05-17 DIAGNOSIS — R2689 Other abnormalities of gait and mobility: Secondary | ICD-10-CM
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
## 2016-05-17 NOTE — Patient Instructions (Signed)
PERFORM THESE EXERCISES STANDING AT THE COUNTER, AND USE THE COUNTER FOR SUPPORT AS NEEDED.   Standing Marching   Using a chair if necessary, march in place. Repeat _10___ times. Do __1-2__ sessions per day.  http://gt2.exer.us/344   Copyright  VHI. All rights reserved.   Hip Backward Kick   Using a chair for balance, keep legs shoulder width apart and toes pointed for- ward. Slowly extend one leg back, keeping knee straight. Do not lean forward. Repeat with other leg. Repeat _10__ times. Do __1-2__ sessions per day.  http://gt2.exer.us/340   Copyright  VHI. All rights reserved.   Hip Side Kick   Holding a chair for balance, keep legs shoulder width apart and toes pointed forward. Swing a leg out to side, keeping knee straight. Do not lean. Repeat using other leg. Repeat __10__ times. Do __1-2__ sessions per day.  http://gt2.exer.us/342   Copyright  VHI. All rights reserved.   Feet Heel-Toe "Tandem", Varied Arm Positions - Eyes Open   With eyes open, right foot directly in front of the other, arms out, look straight ahead at a stationary object. Hold __10-15 seconds. Repeat _3___ times per session. Do ___1-2_ sessions per day.  Copyright  VHI. All rights reserved.    Standing On One Leg Without Support    Stand on one leg in neutral spine without support. Hold _10___ seconds. Repeat on other leg. Do __3__ repetitions each leg.  http://bt.exer.us/36   Copyright  VHI. All rights reserved.    Side-Stepping   Walk to left side with eyes open. Take even steps, leading with same foot. Make sure each foot lifts off the floor. Repeat in opposite direction. Repeat for __3-5__ reps the length of your counter.  Do _1-2___ sessions per day.   Copyright  VHI. All rights reserved.     AMBULATION: Walk Backward   Walk backward. Take large steps, do not drag feet. _3-5 lengths of counter, _1-2__ sets per day. Copyright  VHI. All rights reserved.        Braiding   Move to side: 1) cross right leg in front of left, 2) bring back leg out to side, then 3) cross right leg behind left, 4) bring left leg out to side. Continue sequence in same direction. Reverse sequence, moving in opposite direction. Repeat sequence __3__ times per session. Do _1-2___ sessions per day.   Copyright  VHI. All rights reserved.

## 2016-05-18 ENCOUNTER — Encounter: Payer: Self-pay | Admitting: Physical Therapy

## 2016-05-18 ENCOUNTER — Ambulatory Visit: Payer: Medicare Other | Admitting: Physical Therapy

## 2016-05-18 DIAGNOSIS — R2681 Unsteadiness on feet: Secondary | ICD-10-CM

## 2016-05-18 DIAGNOSIS — R2689 Other abnormalities of gait and mobility: Secondary | ICD-10-CM | POA: Diagnosis not present

## 2016-05-18 NOTE — Patient Instructions (Signed)
PERFORM THESE EXERCISES STANDING AT THE COUNTER, AND USE THE COUNTER FOR SUPPORT AS NEEDED.   Standing Marching   Using a chair if necessary, march in place. Repeat _10___ times. Do __1-2__ sessions per day.  http://gt2.exer.us/344   Copyright  VHI. All rights reserved.   Hip Backward Kick   Using a chair for balance, keep legs shoulder width apart and toes pointed for- ward. Slowly extend one leg back, keeping knee straight. Do not lean forward. Repeat with other leg. Repeat _10__ times. Do __1-2__ sessions per day.  http://gt2.exer.us/340   Copyright  VHI. All rights reserved.   Hip Side Kick   Holding a chair for balance, keep legs shoulder width apart and toes pointed forward. Swing a leg out to side, keeping knee straight. Do not lean. Repeat using other leg. Repeat __10__ times. Do __1-2__ sessions per day.  http://gt2.exer.us/342   Copyright  VHI. All rights reserved.   Feet Heel-Toe "Tandem", Varied Arm Positions - Eyes Open   With eyes open, right foot directly in front of the other, arms out, look straight ahead at a stationary object. Hold __10-15 seconds. Repeat _3___ times per session. Do ___1-2_ sessions per day.  Copyright  VHI. All rights reserved.    Standing On One Leg Without Support    Stand on one leg in neutral spine without support. Hold _10___ seconds. Repeat on other leg. Do __3__ repetitions each leg.  http://bt.exer.us/36   Copyright  VHI. All rights reserved.    Side-Stepping   Walk to left side with eyes open. Take even steps, leading with same foot. Make sure each foot lifts off the floor. Repeat in opposite direction. Repeat for __3-5__ reps the length of your counter.  Do _1-2___ sessions per day.   Copyright  VHI. All rights reserved.     AMBULATION: Walk Backward   Walk backward. Take large steps, do not drag feet. _3-5 lengths of counter, _1-2__ sets per day. Copyright  VHI. All rights reserved.        Braiding   Move to side: 1) cross right leg in front of left, 2) bring back leg out to side, then 3) cross right leg behind left, 4) bring left leg out to side. Continue sequence in same direction. Reverse sequence, moving in opposite direction. Repeat sequence __3__ times per session. Do _1-2___ sessions per day.   Copyright  VHI. All rights reserved.
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
## 2016-05-19 LAB — CUP PACEART INCLINIC DEVICE CHECK
HighPow Impedance: 49 Ohm
Implantable Lead Location: 753860
Implantable Lead Model: 5076
Implantable Pulse Generator Implant Date: 20100817
Lead Channel Setting Pacing Amplitude: 2 V
Lead Channel Setting Pacing Amplitude: 2.4 V
Lead Channel Setting Pacing Pulse Width: 0.4 ms
MDC IDC LEAD IMPLANT DT: 20050922
MDC IDC LEAD IMPLANT DT: 20050922
MDC IDC LEAD LOCATION: 753859
MDC IDC LEAD MODEL: 158
MDC IDC LEAD SERIAL: 156891
MDC IDC PG SERIAL: 141895
MDC IDC SESS DTM: 20171016040000
MDC IDC SET LEADCHNL RV SENSING SENSITIVITY: 0.6 mV
MDC IDC STAT BRADY RA PERCENT PACED: 50 %
MDC IDC STAT BRADY RV PERCENT PACED: 1 % — AB

## 2016-05-23 ENCOUNTER — Telehealth: Payer: Self-pay | Admitting: Licensed Clinical Social Worker

## 2016-05-23 NOTE — Telephone Encounter (Signed)
CSW referred to assist with questions regarding insurance and medical bills. Patient's wife states that she is being charged a fee for patient's CPAP machine and had never been charged before while residing in [place]. Wife also stated that she made application for medicaid to assist with payment with multiple unpaid medical bills. Wife asked for additional options like [place] discount program. CSW discussed programs and options and encouraged wife to complete follow up on application for medicaid and contact her current insurance for understanding of benefits and covered services. Wife verbalizes understanding and will return call to [place] with further needs. Raquel Sarna, LCSW [phone]
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
## 2016-05-24 ENCOUNTER — Ambulatory Visit (INDEPENDENT_AMBULATORY_CARE_PROVIDER_SITE_OTHER): Payer: Medicare Other | Admitting: Nurse Practitioner

## 2016-05-24 ENCOUNTER — Ambulatory Visit: Payer: Medicare Other | Attending: Adult Health | Admitting: Physical Therapy

## 2016-05-24 ENCOUNTER — Encounter: Payer: Self-pay | Admitting: Nurse Practitioner

## 2016-05-24 VITALS — BP 128/72 | HR 60 | Ht 69.0 in | Wt 202.4 lb

## 2016-05-24 DIAGNOSIS — I472 Ventricular tachycardia, unspecified: Secondary | ICD-10-CM

## 2016-05-24 DIAGNOSIS — I480 Paroxysmal atrial fibrillation: Secondary | ICD-10-CM | POA: Diagnosis not present

## 2016-05-24 DIAGNOSIS — R2689 Other abnormalities of gait and mobility: Secondary | ICD-10-CM | POA: Insufficient documentation

## 2016-05-24 DIAGNOSIS — N183 Chronic kidney disease, stage 3 unspecified: Secondary | ICD-10-CM

## 2016-05-24 DIAGNOSIS — I5022 Chronic systolic (congestive) heart failure: Secondary | ICD-10-CM | POA: Diagnosis not present

## 2016-05-24 DIAGNOSIS — M6281 Muscle weakness (generalized): Secondary | ICD-10-CM | POA: Insufficient documentation

## 2016-05-24 LAB — CUP PACEART INCLINIC DEVICE CHECK
Implantable Lead Implant Date: 20050922
Implantable Lead Location: 753860
Implantable Lead Model: 5076
Implantable Lead Serial Number: 156891
Implantable Pulse Generator Implant Date: 20100817
MDC IDC LEAD IMPLANT DT: 20050922
MDC IDC LEAD LOCATION: 753859
MDC IDC LEAD MODEL: 158
MDC IDC PG SERIAL: 141895
MDC IDC SESS DTM: 20180103085438

## 2016-05-24 NOTE — Patient Instructions (Signed)
Medication Instructions:  Your physician recommends that you continue on your current medications as directed. Please refer to the Current Medication list given to you today.   Labwork: None Ordered   Testing/Procedures: None Ordered   Follow-Up: Your physician recommends that you schedule a follow-up appointment in: 6 weeks with Chanetta Marshall, NP   If you need a refill on your cardiac medications before your next appointment, please call your pharmacy.   Thank you for choosing [facility]! Christen Bame, RN [phone]

## 2016-05-25 ENCOUNTER — Encounter: Payer: Medicare Other | Admitting: Physical Therapy

## 2016-05-25 LAB — CUP PACEART INCLINIC DEVICE CHECK
Battery Remaining Longevity: 53 mo
HighPow Impedance: 52 Ohm
Implantable Lead Implant Date: 20050922
Implantable Lead Location: 753860
Implantable Lead Model: 5076
Implantable Lead Serial Number: 156891
Implantable Pulse Generator Implant Date: 20100817
Lead Channel Impedance Value: 470 Ohm
Lead Channel Impedance Value: 524 Ohm
Lead Channel Pacing Threshold Amplitude: 0.4 V
Lead Channel Pacing Threshold Pulse Width: 0.4 ms
Lead Channel Sensing Intrinsic Amplitude: 23.6 mV
Lead Channel Setting Pacing Amplitude: 2.4 V
Lead Channel Setting Pacing Pulse Width: 0.4 ms
Lead Channel Setting Sensing Sensitivity: 0.6 mV
MDC IDC LEAD IMPLANT DT: 20050922
MDC IDC LEAD LOCATION: 753859
MDC IDC LEAD MODEL: 158
MDC IDC MSMT LEADCHNL RA PACING THRESHOLD PULSEWIDTH: 0.4 ms
MDC IDC MSMT LEADCHNL RA SENSING INTR AMPL: 3.5 mV
MDC IDC MSMT LEADCHNL RV IMPEDANCE VALUE: 3.5 Ohm
MDC IDC MSMT LEADCHNL RV PACING THRESHOLD AMPLITUDE: 1 V
MDC IDC PG SERIAL: 141895
MDC IDC SESS DTM: 20180104094458
MDC IDC SET LEADCHNL RA PACING AMPLITUDE: 2 V
MDC IDC STAT BRADY RA PERCENT PACED: 50 %

## 2016-05-29 ENCOUNTER — Ambulatory Visit: Payer: Medicare Other | Admitting: Physical Therapy

## 2016-05-29 DIAGNOSIS — R2689 Other abnormalities of gait and mobility: Secondary | ICD-10-CM | POA: Diagnosis not present

## 2016-05-29 DIAGNOSIS — M6281 Muscle weakness (generalized): Secondary | ICD-10-CM
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
## 2016-06-01 ENCOUNTER — Ambulatory Visit: Payer: Medicare Other | Admitting: Physical Therapy

## 2016-06-06 ENCOUNTER — Ambulatory Visit: Payer: Medicare Other | Admitting: Physical Therapy

## 2016-06-08 ENCOUNTER — Encounter: Payer: Medicare Other | Admitting: Physical Therapy

## 2016-06-09 ENCOUNTER — Other Ambulatory Visit (HOSPITAL_COMMUNITY): Payer: Self-pay | Admitting: Internal Medicine

## 2016-06-09 ENCOUNTER — Other Ambulatory Visit: Payer: Self-pay | Admitting: Internal Medicine

## 2016-06-09 DIAGNOSIS — I482 Chronic atrial fibrillation, unspecified: Secondary | ICD-10-CM

## 2016-06-13 ENCOUNTER — Ambulatory Visit (INDEPENDENT_AMBULATORY_CARE_PROVIDER_SITE_OTHER): Payer: Medicare Other | Admitting: Adult Health

## 2016-06-13 ENCOUNTER — Encounter: Payer: Self-pay | Admitting: Adult Health

## 2016-06-13 VITALS — BP 106/72 | Temp 98.3°F | Ht 69.0 in | Wt 198.0 lb

## 2016-06-13 DIAGNOSIS — M79675 Pain in left toe(s): Secondary | ICD-10-CM | POA: Diagnosis not present
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
## 2016-06-14 ENCOUNTER — Telehealth: Payer: Self-pay | Admitting: Adult Health

## 2016-06-14 ENCOUNTER — Other Ambulatory Visit: Payer: Self-pay

## 2016-06-14 MED ORDER — LACOSAMIDE 50 MG PO TABS: 50.0000 mg | ORAL_TABLET | Freq: Two times a day (BID) | ORAL | 0 refills | Status: DC

## 2016-06-14 NOTE — Telephone Encounter (Signed)
Pt saw [facility] yesterday and understood he would refill his VIMPAT 50 MG TABS tablet  Pt is completely out, and hope to get today.  [facility]/ [address]

## 2016-06-14 NOTE — Telephone Encounter (Signed)
Rx has been faxed in.  

## 2016-06-26 ENCOUNTER — Ambulatory Visit (INDEPENDENT_AMBULATORY_CARE_PROVIDER_SITE_OTHER): Payer: Medicare Other | Admitting: Neurology

## 2016-06-26 ENCOUNTER — Encounter: Payer: Self-pay | Admitting: Neurology

## 2016-06-26 VITALS — BP 124/78 | HR 60 | Ht 69.0 in | Wt 202.4 lb

## 2016-06-26 DIAGNOSIS — G40209 Localization-related (focal) (partial) symptomatic epilepsy and epileptic syndromes with complex partial seizures, not intractable, without status epilepticus: Secondary | ICD-10-CM | POA: Diagnosis not present

## 2016-06-26 MED ORDER — ZONISAMIDE 100 MG PO CAPS: ORAL_CAPSULE | ORAL | 6 refills | Status: DC

## 2016-06-26 MED ORDER — LACOSAMIDE 50 MG PO TABS: 50.0000 mg | ORAL_TABLET | Freq: Two times a day (BID) | ORAL | 5 refills | Status: DC
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
## 2016-07-03 ENCOUNTER — Ambulatory Visit (INDEPENDENT_AMBULATORY_CARE_PROVIDER_SITE_OTHER): Payer: Medicare Other | Admitting: Neurology

## 2016-07-03 DIAGNOSIS — G40209 Localization-related (focal) (partial) symptomatic epilepsy and epileptic syndromes with complex partial seizures, not intractable, without status epilepticus: Secondary | ICD-10-CM | POA: Insufficient documentation

## 2016-07-05 ENCOUNTER — Ambulatory Visit
Admission: RE | Admit: 2016-07-05 | Discharge: 2016-07-05 | Disposition: A | Discharge status: Home / Self Care | Payer: Medicare Other | Source: Ambulatory Visit | Attending: Neurology | Admitting: Neurology

## 2016-07-05 ENCOUNTER — Encounter: Payer: Self-pay | Admitting: Neurology

## 2016-07-05 ENCOUNTER — Inpatient Hospital Stay: Admission: RE | Admit: 2016-07-05 | Payer: Medicare Other | Source: Ambulatory Visit

## 2016-07-05 DIAGNOSIS — G40209 Localization-related (focal) (partial) symptomatic epilepsy and epileptic syndromes with complex partial seizures, not intractable, without status epilepticus: Secondary | ICD-10-CM
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
## 2016-07-11 ENCOUNTER — Ambulatory Visit: Payer: Medicare Other | Admitting: Neurology

## 2016-07-12 ENCOUNTER — Telehealth: Payer: Self-pay | Admitting: Neurology

## 2016-07-12 ENCOUNTER — Encounter: Payer: Self-pay | Admitting: Nurse Practitioner

## 2016-07-12 ENCOUNTER — Ambulatory Visit (INDEPENDENT_AMBULATORY_CARE_PROVIDER_SITE_OTHER): Payer: Medicare Other | Admitting: Nurse Practitioner

## 2016-07-12 VITALS — BP 130/58 | HR 58 | Ht 69.0 in | Wt 201.0 lb

## 2016-07-12 DIAGNOSIS — I480 Paroxysmal atrial fibrillation: Secondary | ICD-10-CM

## 2016-07-12 DIAGNOSIS — N183 Chronic kidney disease, stage 3 unspecified: Secondary | ICD-10-CM

## 2016-07-12 DIAGNOSIS — I472 Ventricular tachycardia, unspecified: Secondary | ICD-10-CM

## 2016-07-12 DIAGNOSIS — I5022 Chronic systolic (congestive) heart failure: Secondary | ICD-10-CM

## 2016-07-12 LAB — CUP PACEART INCLINIC DEVICE CHECK
Implantable Lead Implant Date: 20050922
Implantable Lead Location: 753859
Implantable Lead Location: 753860
Implantable Lead Model: 5076
Implantable Lead Serial Number: 156891
Implantable Pulse Generator Implant Date: 20100817
MDC IDC LEAD IMPLANT DT: 20050922
MDC IDC SESS DTM: 20180221144624
Pulse Gen Serial Number: 141895

## 2016-07-12 LAB — CBC
HEMOGLOBIN: 14 g/dL (ref 13.0–17.7)
Hematocrit: 41.6 % (ref 37.5–51.0)
MCH: 30.4 pg (ref 26.6–33.0)
MCHC: 33.7 g/dL (ref 31.5–35.7)
MCV: 90 fL (ref 79–97)
Platelets: 243 10*3/uL (ref 150–379)
RBC: 4.61 x10E6/uL (ref 4.14–5.80)
RDW: 13.9 % (ref 12.3–15.4)
WBC: 7.7 10*3/uL (ref 3.4–10.8)

## 2016-07-12 LAB — BASIC METABOLIC PANEL
BUN / CREAT RATIO: 9 — AB (ref 10–24)
BUN: 17 mg/dL (ref 8–27)
CALCIUM: 9.7 mg/dL (ref 8.6–10.2)
CO2: 22 mmol/L (ref 18–29)
Chloride: 103 mmol/L (ref 96–106)
Creatinine, Ser: 1.79 mg/dL — ABNORMAL HIGH (ref 0.76–1.27)
GFR, EST AFRICAN AMERICAN: 44 — AB (ref >59)
GFR, EST NON AFRICAN AMERICAN: 38 — AB (ref >59)
Glucose: 169 mg/dL — ABNORMAL HIGH (ref 65–99)
Potassium: 4.5 mmol/L (ref 3.5–5.2)
Sodium: 140 mmol/L (ref 134–144)

## 2016-07-12 LAB — HEPATIC FUNCTION PANEL
ALT: 21 IU/L (ref 0–44)
AST: 21 IU/L (ref 0–40)
Albumin: 4.7 g/dL (ref 3.6–4.8)
Alkaline Phosphatase: 47 IU/L (ref 39–117)
BILIRUBIN, DIRECT: 0.13 mg/dL (ref 0.00–0.40)
Bilirubin Total: 0.3 mg/dL (ref 0.0–1.2)
Total Protein: 7.1 g/dL (ref 6.0–8.5)

## 2016-07-12 LAB — MAGNESIUM: Magnesium: 2.1 mg/dL (ref 1.6–2.3)

## 2016-07-12 NOTE — Telephone Encounter (Signed)
PT left a message that the medication is too strong/Dawn CB# [phone]

## 2016-07-12 NOTE — Patient Instructions (Addendum)
Medication Instructions:   Your physician recommends that you continue on your current medications as directed. Please refer to the Current Medication list given to you today.   If you need a refill on your cardiac medications before your next appointment, please call your pharmacy.  Labwork: CBC BMET MAG LFT  TODAY   Testing/Procedures:  NONE ORDERED  TODAY    Follow-Up: Your physician wants you to follow-up in:  IN  6  MONTHS WITH DR  Rayann Heman  You will receive a reminder letter in the mail two months in advance. If you don't receive a letter, please call our office to schedule the follow-up appointment.   Remote monitoring is used to monitor your Pacemaker of ICD from home. This monitoring reduces the number of office visits required to check your device to one time per year. It allows [place] to keep an eye on the functioning of your device to ensure it is working properly. You are scheduled for a device check from home on .  10/10/2016 You may send your transmission at any time that day. If you have a wireless device, the transmission will be sent automatically. After your physician reviews your transmission, you will receive a postcard with your next transmission date.      Any Other Special Instructions Will Be Listed Below (If Applicable).

## 2016-07-13 NOTE — Telephone Encounter (Signed)
Spoke with patients wife. She states he was doing fine taking the Zonisamide 100mg  one tablet but since increased to two tablets he is staying really tired and not feeling like doing anything. Please advise.

## 2016-07-14 ENCOUNTER — Ambulatory Visit (INDEPENDENT_AMBULATORY_CARE_PROVIDER_SITE_OTHER): Payer: Medicare Other | Admitting: Family Medicine

## 2016-07-14 ENCOUNTER — Encounter: Payer: Self-pay | Admitting: Family Medicine

## 2016-07-14 ENCOUNTER — Telehealth: Payer: Self-pay | Admitting: Adult Health

## 2016-07-14 VITALS — BP 100/70 | HR 68 | Ht 69.0 in | Wt 198.0 lb

## 2016-07-14 DIAGNOSIS — L139 Bullous disorder, unspecified: Secondary | ICD-10-CM | POA: Diagnosis not present

## 2016-07-14 MED ORDER — DOXYCYCLINE HYCLATE 100 MG PO CAPS: 100.0000 mg | ORAL_CAPSULE | Freq: Two times a day (BID) | ORAL | 0 refills | Status: DC

## 2016-07-14 MED ORDER — DOXYCYCLINE HYCLATE 100 MG PO CAPS
100.0000 mg | ORAL_CAPSULE | Freq: Two times a day (BID) | ORAL | 0 refills | Status: DC
Start: 2016-07-14 — End: 2016-07-14
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
## 2016-07-14 NOTE — Patient Instructions (Signed)
Keep wound clean with soap and water Start the Doxycycline Follow up next week with Tommi Rumps

## 2016-07-14 NOTE — Telephone Encounter (Signed)
° ° ° °  The below med was sent to the wrong pharmacy  [facility]. Pt said he has never used this pharmacy   Rx should be sent to [facility]    doxycycline (VIBRAMYCIN) 100 MG capsule

## 2016-07-14 NOTE — Telephone Encounter (Signed)
Medication sent in for patient. 

## 2016-07-14 NOTE — Progress Notes (Signed)
Pre visit review using our clinic review tool, if applicable. No additional management support is needed unless otherwise documented below in the visit note. 

## 2016-07-17 NOTE — Telephone Encounter (Signed)
Pls let wife know the EEG is normal. He can stay on the low dose 1 tablet Zonisamide 100mg  for now and we will see how he does on the low dose and discuss on his next visit. Thanks

## 2016-07-17 NOTE — Telephone Encounter (Signed)
Clld pt's wife - Advsd of provider's notations.  Mrs. travonta hoard she has stopped medication due to the following: Kidney levels are now very elevated; pt is very lethargic; pt is has extreme fatigue and malaise. Mrs. Crofts stated this happened before while in [place] but when he was taken off medication his kidney levels went back to normal. She also stated she had gotten his Stage 4 kidney disease to Stage 3 once discontinuing other medication but it's back to Stage 4 now since being on Zonisamide which is another reason that she stopped it. She advsd his next appt is 08/11/16  - only appt presently opened.  Advsd pt I would send this message to the provider so she will be aware of her discontinuing the medication. Mrs. Horath stated she understood and thanked me.

## 2016-07-17 NOTE — Procedures (Signed)
ELECTROENCEPHALOGRAM REPORT  Dates of Recording: 07/03/2016 10:08 AM to 07/05/2016 08:52 AM  Patient's Name: [name] MRN: [id number] Date of Birth: 05-Apr-1946  Referring Provider: Dr. 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 EEG for classification.  Medications: Vimpat, Eliquis, Proscar, Symbicort, Fenofibrate, Lasix, Bidil, Toprol, Myrbetriq, Crestor, Betapace  Technical Summary: This is a 48-hour multichannel digital EEG recording measured by the international 10-20 system with electrodes applied with paste and impedances below 5000 ohms performed as portable with EKG monitoring.  The digital EEG was referentially recorded, reformatted, and digitally filtered in a variety of bipolar and referential montages for optimal display.    DESCRIPTION OF RECORDING: During maximal wakefulness, the background activity consisted of a symmetric 9-9.5 Hz posterior dominant rhythm which was reactive to eye opening.  There were no epileptiform discharges or focal slowing seen in wakefulness.  During the recording, the patient progresses through wakefulness, drowsiness, and Stage 2 sleep.  Again, there were no epileptiform discharges seen.  Events: There were no push button events.   There were no electrographic seizures seen.  EKG lead was unremarkable.  IMPRESSION: This 48-hour ambulatory EEG study is normal.    CLINICAL CORRELATION: A normal EEG does not exclude a clinical diagnosis of epilepsy. Typical events were not captured.  If further clinical questions remain, inpatient video EEG monitoring may be helpful.   Ellouise Newer, M.D.

## 2016-07-21 ENCOUNTER — Ambulatory Visit (INDEPENDENT_AMBULATORY_CARE_PROVIDER_SITE_OTHER): Payer: Medicare Other | Admitting: Adult Health

## 2016-07-21 ENCOUNTER — Encounter: Payer: Self-pay | Admitting: Adult Health

## 2016-07-21 VITALS — BP 130/68 | HR 51 | Temp 98.3°F | Ht 69.0 in | Wt 200.4 lb

## 2016-07-21 DIAGNOSIS — T148XXA Other injury of unspecified body region, initial encounter: Secondary | ICD-10-CM | POA: Diagnosis not present
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
## 2016-07-21 NOTE — Progress Notes (Signed)
Pre visit review using our clinic review tool, if applicable. No additional management support is needed unless otherwise documented below in the visit note. 

## 2016-07-24 ENCOUNTER — Other Ambulatory Visit: Payer: Medicare Other

## 2016-07-28 ENCOUNTER — Other Ambulatory Visit (HOSPITAL_COMMUNITY): Payer: Self-pay | Admitting: Internal Medicine

## 2016-08-08 ENCOUNTER — Ambulatory Visit (INDEPENDENT_AMBULATORY_CARE_PROVIDER_SITE_OTHER): Payer: Medicare Other | Admitting: Pulmonary Disease

## 2016-08-08 ENCOUNTER — Encounter: Payer: Self-pay | Admitting: Pulmonary Disease

## 2016-08-08 VITALS — BP 114/74 | HR 61 | Ht 69.0 in | Wt 201.0 lb

## 2016-08-08 DIAGNOSIS — Z9989 Dependence on other enabling machines and devices: Secondary | ICD-10-CM

## 2016-08-08 DIAGNOSIS — J849 Interstitial pulmonary disease, unspecified: Secondary | ICD-10-CM

## 2016-08-08 DIAGNOSIS — G4733 Obstructive sleep apnea (adult) (pediatric): Secondary | ICD-10-CM | POA: Diagnosis not present

## 2016-08-08 DIAGNOSIS — J438 Other emphysema: Secondary | ICD-10-CM | POA: Diagnosis not present

## 2016-08-08 DIAGNOSIS — J439 Emphysema, unspecified: Secondary | ICD-10-CM

## 2016-08-08 LAB — PULMONARY FUNCTION TEST
DL/VA % pred: 73 %
DL/VA: 3.37 ml/min/mmHg/L
DLCO UNC: 18.45 ml/min/mmHg
DLCO cor % pred: 57 %
DLCO cor: 18.56 ml/min/mmHg
DLCO unc % pred: 57 %
FEF 25-75 Pre: 2.56 L/sec
FEF2575-%PRED-PRE: 102 %
FEV1-%PRED-PRE: 99 %
FEV1-PRE: 2.89 L
FEV1FVC-%Pred-Pre: 101 %
FEV6-%Pred-Pre: 101 %
FEV6-Pre: 3.73 L
FEV6FVC-%PRED-PRE: 104 %
FVC-%PRED-PRE: 97 %
FVC-PRE: 3.76 L
PRE FEV1/FVC RATIO: 77 %
Pre FEV6/FVC Ratio: 99 %

## 2016-08-08 MED ORDER — ALBUTEROL SULFATE HFA 108 (90 BASE) MCG/ACT IN AERS: 1.0000 | INHALATION_SPRAY | RESPIRATORY_TRACT | 3 refills | Status: DC | PRN

## 2016-08-08 MED ORDER — BUDESONIDE-FORMOTEROL FUMARATE 160-4.5 MCG/ACT IN AERO: 2.0000 | INHALATION_SPRAY | Freq: Two times a day (BID) | RESPIRATORY_TRACT | 6 refills | Status: DC

## 2016-08-08 NOTE — Progress Notes (Signed)
Test reviewed.  

## 2016-08-08 NOTE — Patient Instructions (Signed)
   Call me if you have any new breathing problems before your next appointment.  I'm sending in a prescription for the Symbicort as well as an emergency albuterol inhaler to use as needed.  Try taking your Symbicort twice daily at the same time as your wife to help you remember your inhaler.  I will see you back in 3 months or sooner if needed.  TESTS ORDERED: 1. Spirometry with DLCO at next appointment 2. 6MWT on room air at next appointment

## 2016-08-08 NOTE — Addendum Note (Signed)
Addended by: Tyson Dense on: 08/08/2016 11:22 AM   Modules accepted: Orders

## 2016-08-08 NOTE — Progress Notes (Signed)
PFT done today. 
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
## 2016-08-10 DIAGNOSIS — I10 Essential (primary) hypertension: Secondary | ICD-10-CM | POA: Diagnosis not present

## 2016-08-10 DIAGNOSIS — N183 Chronic kidney disease, stage 3 (moderate): Secondary | ICD-10-CM | POA: Diagnosis not present

## 2016-08-17 ENCOUNTER — Telehealth (HOSPITAL_COMMUNITY): Payer: Self-pay | Admitting: *Deleted

## 2016-08-17 ENCOUNTER — Encounter (HOSPITAL_COMMUNITY): Payer: Self-pay | Admitting: Internal Medicine

## 2016-08-17 ENCOUNTER — Encounter: Payer: Self-pay | Admitting: Family Medicine

## 2016-08-17 ENCOUNTER — Ambulatory Visit (INDEPENDENT_AMBULATORY_CARE_PROVIDER_SITE_OTHER): Payer: Medicare Other | Admitting: Family Medicine

## 2016-08-17 ENCOUNTER — Ambulatory Visit (HOSPITAL_COMMUNITY)
Admission: RE | Admit: 2016-08-17 | Discharge: 2016-08-17 | Disposition: A | Discharge status: Home / Self Care | Payer: Medicare Other | Source: Ambulatory Visit | Attending: Internal Medicine | Admitting: Internal Medicine

## 2016-08-17 VITALS — BP 136/72 | HR 73 | Wt 198.4 lb

## 2016-08-17 VITALS — BP 138/80 | HR 70 | Temp 98.3°F | Wt 197.2 lb

## 2016-08-17 DIAGNOSIS — R05 Cough: Secondary | ICD-10-CM

## 2016-08-17 DIAGNOSIS — I252 Old myocardial infarction: Secondary | ICD-10-CM | POA: Insufficient documentation

## 2016-08-17 DIAGNOSIS — G4733 Obstructive sleep apnea (adult) (pediatric): Secondary | ICD-10-CM | POA: Diagnosis not present

## 2016-08-17 DIAGNOSIS — I480 Paroxysmal atrial fibrillation: Secondary | ICD-10-CM | POA: Diagnosis not present

## 2016-08-17 DIAGNOSIS — M19019 Primary osteoarthritis, unspecified shoulder: Secondary | ICD-10-CM | POA: Insufficient documentation

## 2016-08-17 DIAGNOSIS — Z8673 Personal history of transient ischemic attack (TIA), and cerebral infarction without residual deficits: Secondary | ICD-10-CM | POA: Insufficient documentation

## 2016-08-17 DIAGNOSIS — I255 Ischemic cardiomyopathy: Secondary | ICD-10-CM | POA: Insufficient documentation

## 2016-08-17 DIAGNOSIS — Z95810 Presence of automatic (implantable) cardiac defibrillator: Secondary | ICD-10-CM | POA: Insufficient documentation

## 2016-08-17 DIAGNOSIS — J069 Acute upper respiratory infection, unspecified: Secondary | ICD-10-CM | POA: Diagnosis not present

## 2016-08-17 DIAGNOSIS — I2510 Atherosclerotic heart disease of native coronary artery without angina pectoris: Secondary | ICD-10-CM | POA: Insufficient documentation

## 2016-08-17 DIAGNOSIS — I5022 Chronic systolic (congestive) heart failure: Secondary | ICD-10-CM | POA: Diagnosis not present

## 2016-08-17 DIAGNOSIS — K219 Gastro-esophageal reflux disease without esophagitis: Secondary | ICD-10-CM | POA: Diagnosis not present

## 2016-08-17 DIAGNOSIS — Z87891 Personal history of nicotine dependence: Secondary | ICD-10-CM | POA: Insufficient documentation

## 2016-08-17 DIAGNOSIS — I130 Hypertensive heart and chronic kidney disease with heart failure and stage 1 through stage 4 chronic kidney disease, or unspecified chronic kidney disease: Secondary | ICD-10-CM | POA: Insufficient documentation

## 2016-08-17 DIAGNOSIS — N183 Chronic kidney disease, stage 3 (moderate): Secondary | ICD-10-CM | POA: Insufficient documentation

## 2016-08-17 DIAGNOSIS — E785 Hyperlipidemia, unspecified: Secondary | ICD-10-CM | POA: Diagnosis not present

## 2016-08-17 DIAGNOSIS — I4891 Unspecified atrial fibrillation: Secondary | ICD-10-CM

## 2016-08-17 DIAGNOSIS — R059 Cough, unspecified: Secondary | ICD-10-CM

## 2016-08-17 MED ORDER — PREDNISONE 10 MG PO TABS: ORAL_TABLET | ORAL | 0 refills | Status: DC

## 2016-08-17 MED ORDER — ISOSORB DINITRATE-HYDRALAZINE 20-37.5 MG PO TABS: 2.0000 | ORAL_TABLET | Freq: Three times a day (TID) | ORAL | 3 refills | Status: DC

## 2016-08-17 MED ORDER — PROMETHAZINE-DM 6.25-15 MG/5ML PO SYRP: 5.0000 mL | ORAL_SOLUTION | Freq: Four times a day (QID) | ORAL | 0 refills | Status: DC | PRN

## 2016-08-17 MED ORDER — BENZONATATE 100 MG PO CAPS: 100.0000 mg | ORAL_CAPSULE | Freq: Three times a day (TID) | ORAL | 0 refills | Status: DC
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
## 2016-08-17 NOTE — Telephone Encounter (Signed)
Surgical Clearance faxed to [facility] @ [phone] as requested. Office note from Dr. Haroldine Laws on 08/11/16 was faxed to them stating "She is a low to moderate risk for peri-op CV complications. Can proceed to surgery without further testing.  I prefer coumadin not be stopped but if need to stop would restart immediately."

## 2016-08-17 NOTE — Patient Instructions (Signed)
Increase Bidil to 2 Tablets Three Times Daily  Follow up in 3-4 Months with Dr. Haroldine Laws
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
## 2016-08-17 NOTE — Progress Notes (Signed)
Pre visit review using our clinic review tool, if applicable. No additional management support is needed unless otherwise documented below in the visit note. 

## 2016-08-17 NOTE — Patient Instructions (Addendum)
It was a pleasure to see you today. Your symptoms today are most likely caused by viral illness. Please drink plenty of water enough for your urine to be pale yellow or clear. You may use Tylenol 325 mg every 6 hours as needed.  Follow-up for evaluation if your symptoms do not improve in 3-4 days, worsen, or you develop a fever greater than 100.  Upper Respiratory Infection, Adult Most upper respiratory infections (URIs) are caused by a virus. A URI affects the nose, throat, and upper air passages. The most common type of URI is often called "the common cold." Follow these instructions at home:  Take medicines only as told by your doctor.  Gargle warm saltwater or take cough drops to comfort your throat as told by your doctor.  Use a warm mist humidifier or inhale steam from a shower to increase air moisture. This may make it easier to breathe.  Drink enough fluid to keep your pee (urine) clear or pale yellow.  Eat soups and other clear broths.  Have a healthy diet.  Rest as needed.  Go back to work when your fever is gone or your doctor says it is okay.  You may need to stay home longer to avoid giving your URI to others.  You can also wear a face mask and wash your hands often to prevent spread of the virus.  Use your inhaler more if you have asthma.  Do not use any tobacco products, including cigarettes, chewing tobacco, or electronic cigarettes. If you need help quitting, ask your doctor. Contact a doctor if:  You are getting worse, not better.  Your symptoms are not helped by medicine.  You have chills.  You are getting more short of breath.  You have brown or red mucus.  You have yellow or brown discharge from your nose.  You have pain in your face, especially when you bend forward.  You have a fever.  You have puffy (swollen) neck glands.  You have pain while swallowing.  You have white areas in the back of your throat. Get help right away if:  You have  very bad or constant:  Headache.  Ear pain.  Pain in your forehead, behind your eyes, and over your cheekbones (sinus pain).  Chest pain.  You have long-lasting (chronic) lung disease and any of the following:  Wheezing.  Long-lasting cough.  Coughing up blood.  A change in your usual mucus.  You have a stiff neck.  You have changes in your:  Vision.  Hearing.  Thinking.  Mood. This information is not intended to replace advice given to you by your health care provider. Make sure you discuss any questions you have with your health care provider. Document Released: 10/25/2007 Document Revised: 01/09/2016 Document Reviewed: 08/13/2013 Elsevier Interactive Patient Education  2017 [employer] NOW OFFER   [facility] FAST TRACK!!!  SAME DAY Appointments for ACUTE CARE  Such as: Sprains, Injuries, cuts, abrasions, rashes, muscle pain, joint pain, back pain Colds, flu, sore throats, headache, allergies, cough, fever  Ear pain, sinus and eye infections Abdominal pain, nausea, vomiting, diarrhea, upset stomach Animal/insect bites  3 Easy Ways to Schedule: Walk-In Scheduling Call in scheduling Mychart Sign-up: [email or web address]

## 2016-08-18 ENCOUNTER — Other Ambulatory Visit: Payer: Self-pay | Admitting: Internal Medicine

## 2016-08-21 ENCOUNTER — Ambulatory Visit: Payer: Medicare Other | Admitting: Neurology

## 2016-08-22 ENCOUNTER — Other Ambulatory Visit: Payer: Self-pay | Admitting: Internal Medicine

## 2016-08-23 ENCOUNTER — Other Ambulatory Visit (HOSPITAL_COMMUNITY): Payer: Self-pay | Admitting: *Deleted

## 2016-08-23 ENCOUNTER — Ambulatory Visit (INDEPENDENT_AMBULATORY_CARE_PROVIDER_SITE_OTHER): Payer: Medicare Other | Admitting: *Deleted

## 2016-08-23 ENCOUNTER — Telehealth: Payer: Self-pay | Admitting: Cardiology

## 2016-08-23 DIAGNOSIS — I472 Ventricular tachycardia, unspecified: Secondary | ICD-10-CM

## 2016-08-23 LAB — CUP PACEART INCLINIC DEVICE CHECK
HIGH POWER IMPEDANCE MEASURED VALUE: 43 Ohm
HIGH POWER IMPEDANCE MEASURED VALUE: 50 Ohm
Implantable Lead Implant Date: 20050922
Implantable Lead Location: 753860
Implantable Lead Model: 5076
Implantable Lead Serial Number: 156891
Implantable Pulse Generator Implant Date: 20100817
Lead Channel Impedance Value: 523 Ohm
Lead Channel Sensing Intrinsic Amplitude: 3.7 mV
Lead Channel Setting Pacing Amplitude: 2 V
Lead Channel Setting Pacing Amplitude: 2.4 V
Lead Channel Setting Pacing Pulse Width: 0.4 ms
MDC IDC LEAD IMPLANT DT: 20050922
MDC IDC LEAD LOCATION: 753859
MDC IDC MSMT LEADCHNL RV IMPEDANCE VALUE: 437 Ohm
MDC IDC MSMT LEADCHNL RV SENSING INTR AMPL: 23 mV
MDC IDC SESS DTM: 20180404040000
MDC IDC SET LEADCHNL RV SENSING SENSITIVITY: 0.6 mV
Pulse Gen Serial Number: 141895

## 2016-08-23 MED ORDER — ROSUVASTATIN CALCIUM 40 MG PO TABS: 40.0000 mg | ORAL_TABLET | Freq: Every evening | ORAL | 3 refills | Status: DC

## 2016-08-23 MED ORDER — SOTALOL HCL 80 MG PO TABS: 80.0000 mg | ORAL_TABLET | Freq: Every day | ORAL | 3 refills | Status: DC

## 2016-08-23 NOTE — Telephone Encounter (Signed)
Spoke with patient's wife who stated that after talking with [employer] she would need a cell adapter to send a successful transmission. Patient added on to device clinic to be seen today at 3:30.

## 2016-08-23 NOTE — Progress Notes (Signed)
Pt seen d/t shock. Pt received an inappropriate shock, d/t under sensed PAC, in turn classified rhythm as V>A and initiated therapy. PAC was measured at 0.10mv. ATP episode from March inappropriate as well. ATP episode from 2/23 was appropriate, EGM Vs>As. Per tech services and [employer] Atrial Tachyarrhythmia Discrimination  turned off. Pt aware of driving restrictions. ROV 09/25/2016 w/ JA.

## 2016-08-23 NOTE — Telephone Encounter (Signed)
Spoke with patients wife who verbalized issues sending the transmission. Tech services number given. Will await call back from wife/transmission to be received.
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
## 2016-09-01 ENCOUNTER — Encounter: Payer: Self-pay | Admitting: Internal Medicine

## 2016-09-04 ENCOUNTER — Ambulatory Visit (INDEPENDENT_AMBULATORY_CARE_PROVIDER_SITE_OTHER): Payer: Medicare Other | Admitting: Family Medicine

## 2016-09-04 ENCOUNTER — Encounter: Payer: Self-pay | Admitting: Family Medicine

## 2016-09-04 VITALS — BP 140/80 | HR 64 | Temp 97.3°F | Ht 69.0 in | Wt 195.8 lb

## 2016-09-04 DIAGNOSIS — S91302A Unspecified open wound, left foot, initial encounter: Secondary | ICD-10-CM

## 2016-09-04 DIAGNOSIS — I2510 Atherosclerotic heart disease of native coronary artery without angina pectoris: Secondary | ICD-10-CM

## 2016-09-04 DIAGNOSIS — L03116 Cellulitis of left lower limb: Secondary | ICD-10-CM | POA: Diagnosis not present

## 2016-09-04 MED ORDER — DOXYCYCLINE HYCLATE 50 MG PO CAPS: 50.0000 mg | ORAL_CAPSULE | Freq: Two times a day (BID) | ORAL | 0 refills | Status: DC

## 2016-09-04 NOTE — Progress Notes (Signed)
Pre visit review using our clinic review tool, if applicable. No additional management support is needed unless otherwise documented below in the visit note. 

## 2016-09-04 NOTE — Patient Instructions (Signed)
BEFORE YOU LEAVE: -follow up: with PCP in 3-5 days  Take the antibiotic (doxycycline) as prescribed. Keep feet clean and dry. Once infection resolved consider daily antifungal x 1 month followed by antifungal at first signs skin changes, peeling skin, cracking skin, etc.  Consider seeing dermatologist or foot specialist if non-healing lesion or recurrent lesions despite these measures.
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
## 2016-09-05 ENCOUNTER — Ambulatory Visit: Payer: Medicare Other | Admitting: Neurology

## 2016-09-08 ENCOUNTER — Other Ambulatory Visit: Payer: Self-pay | Admitting: Adult Health

## 2016-09-14 ENCOUNTER — Telehealth: Payer: Self-pay | Admitting: Adult Health

## 2016-09-14 NOTE — Telephone Encounter (Signed)
Pt needs referral to [facility]. Pt states he has been in our office 3 separate times and his foot is not any better.

## 2016-09-15 ENCOUNTER — Other Ambulatory Visit: Payer: Self-pay | Admitting: Adult Health

## 2016-09-15 DIAGNOSIS — L03116 Cellulitis of left lower limb: Secondary | ICD-10-CM

## 2016-09-15 NOTE — Telephone Encounter (Signed)
Is this ok?

## 2016-09-15 NOTE — Telephone Encounter (Signed)
That is fine 

## 2016-09-15 NOTE — Telephone Encounter (Signed)
Referral has been placed. I left message notifying patient via voicemail.

## 2016-09-18 ENCOUNTER — Other Ambulatory Visit (HOSPITAL_COMMUNITY): Payer: Self-pay | Admitting: Internal Medicine

## 2016-09-25 ENCOUNTER — Encounter: Payer: Self-pay | Admitting: *Deleted

## 2016-09-25 ENCOUNTER — Encounter: Payer: Medicare Other | Admitting: Internal Medicine

## 2016-09-27 ENCOUNTER — Encounter: Payer: Self-pay | Admitting: Podiatry

## 2016-09-27 ENCOUNTER — Ambulatory Visit (INDEPENDENT_AMBULATORY_CARE_PROVIDER_SITE_OTHER): Payer: Medicare Other | Admitting: Podiatry

## 2016-09-27 DIAGNOSIS — B353 Tinea pedis: Secondary | ICD-10-CM

## 2016-09-27 MED ORDER — CLOTRIMAZOLE-BETAMETHASONE 1-0.05 % EX CREA: 1.0000 "application " | TOPICAL_CREAM | Freq: Two times a day (BID) | CUTANEOUS | 2 refills | Status: DC

## 2016-09-27 MED ORDER — TERBINAFINE HCL 250 MG PO TABS: 250.0000 mg | ORAL_TABLET | Freq: Every day | ORAL | 0 refills | Status: DC

## 2016-09-29 NOTE — Progress Notes (Signed)
   HPI: Patient is a 71 year old male presenting today as a new patient complaining of itching lesions of the left foot between digits 3/4 and 4/5 that have been present for about 3 months. He reports associated white drainage from the lesions. He also complains of a painful left great toe nail. He states it grows "curved". He states he has been on numerous antibiotics in the past with no significant relief.     Physical Exam: General: The patient is alert and oriented x3 in no acute distress.  Dermatology: Diffuse hyperkeratosis of the left foot interdigital area with pruritis. Skin is warm, dry and supple bilateral lower extremities. Negative for open lesions or macerations.  Vascular: Palpable pedal pulses bilaterally. No edema or erythema noted. Capillary refill within normal limits.  Neurological: Epicritic and protective threshold grossly intact bilaterally.   Musculoskeletal Exam: Range of motion within normal limits to all pedal and ankle joints bilateral. Muscle strength 5/5 in all groups bilateral.   Assessment: 1. Tinea Pedis left foot   Plan of Care:  1. Patient was evaluated. 2. Prescription for Terbinafine 250 mg #28 provided. 3. Prescription for Lotrisone cream provided. 3. Return to clinic in 4 weeks.   Edrick Kins, DPM [facility]  Dr. Edrick Kins, [address]                                        [place], [place] [address]                Office [phone]  Fax [phone]

## 2016-10-11 ENCOUNTER — Ambulatory Visit (INDEPENDENT_AMBULATORY_CARE_PROVIDER_SITE_OTHER): Payer: Medicare Other | Admitting: Internal Medicine

## 2016-10-11 ENCOUNTER — Encounter: Payer: Self-pay | Admitting: Internal Medicine

## 2016-10-11 ENCOUNTER — Encounter (INDEPENDENT_AMBULATORY_CARE_PROVIDER_SITE_OTHER): Payer: Self-pay

## 2016-10-11 VITALS — BP 94/60 | HR 58 | Ht 69.0 in | Wt 198.8 lb

## 2016-10-11 DIAGNOSIS — I472 Ventricular tachycardia, unspecified: Secondary | ICD-10-CM

## 2016-10-11 DIAGNOSIS — I5022 Chronic systolic (congestive) heart failure: Secondary | ICD-10-CM

## 2016-10-11 DIAGNOSIS — I480 Paroxysmal atrial fibrillation: Secondary | ICD-10-CM | POA: Diagnosis not present

## 2016-10-11 DIAGNOSIS — N183 Chronic kidney disease, stage 3 unspecified: Secondary | ICD-10-CM

## 2016-10-11 LAB — CUP PACEART INCLINIC DEVICE CHECK
Brady Statistic RA Percent Paced: 26 %
HIGH POWER IMPEDANCE MEASURED VALUE: 48 Ohm
HighPow Impedance: 43 Ohm
Implantable Lead Location: 753859
Implantable Lead Model: 158
Implantable Lead Model: 5076
Implantable Pulse Generator Implant Date: 20100817
Lead Channel Impedance Value: 458 Ohm
Lead Channel Impedance Value: 491 Ohm
Lead Channel Pacing Threshold Amplitude: 1 V
Lead Channel Pacing Threshold Pulse Width: 0.4 ms
Lead Channel Sensing Intrinsic Amplitude: 3.2 mV
Lead Channel Setting Pacing Amplitude: 2 V
Lead Channel Setting Pacing Amplitude: 2.4 V
MDC IDC LEAD IMPLANT DT: 20050922
MDC IDC LEAD IMPLANT DT: 20050922
MDC IDC LEAD LOCATION: 753860
MDC IDC LEAD SERIAL: 156891
MDC IDC MSMT LEADCHNL RA PACING THRESHOLD AMPLITUDE: 0.5 V
MDC IDC MSMT LEADCHNL RA PACING THRESHOLD PULSEWIDTH: 0.4 ms
MDC IDC MSMT LEADCHNL RV SENSING INTR AMPL: 23.5 mV
MDC IDC SESS DTM: 20180523040000
MDC IDC SET LEADCHNL RV PACING PULSEWIDTH: 0.4 ms
MDC IDC SET LEADCHNL RV SENSING SENSITIVITY: 0.6 mV
MDC IDC STAT BRADY RV PERCENT PACED: 1 % — AB
Pulse Gen Serial Number: 141895
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
## 2016-10-11 NOTE — Patient Instructions (Signed)
Medication Instructions:  Your physician recommends that you continue on your current medications as directed. Please refer to the Current Medication list given to you today.   Labwork: None ordered   Testing/Procedures: None ordered   Follow-Up: Remote monitoring is used to monitor your ICD from home. This monitoring reduces the number of office visits required to check your device to one time per year. It allows [place] to keep an eye on the functioning of your device to ensure it is working properly. You are scheduled for a device check from home on 01/10/17.   You may send your transmission at any time that day. If you have a wireless device, the transmission will be sent automatically. After your physician reviews your transmission, you will receive a postcard with your next transmission date.  Your physician wants you to follow-up in: 6 months with Chanetta Marshall, NP You will receive a reminder letter in the mail two months in advance. If you don't receive a letter, please call our office to schedule the follow-up appointment.     Any Other Special Instructions Will Be Listed Below (If Applicable).     If you need a refill on your cardiac medications before your next appointment, please call your pharmacy.

## 2016-10-17 ENCOUNTER — Telehealth: Payer: Self-pay | Admitting: *Deleted

## 2016-10-17 NOTE — Telephone Encounter (Signed)
Latitude alert received for 2 episodes of VT converted with ATP (10/12/16 and 10/16/16).  Spoke with Mrs. Perko and made her aware- she reports that he has taken his medications as prescribed, has not missed any doses and that he does not drive. I told her that I would review these episodes with Dr. Rayann Heman and call her back if he has any recommendations. She verbalizes understanding.

## 2016-10-18 ENCOUNTER — Telehealth: Payer: Self-pay

## 2016-10-18 ENCOUNTER — Other Ambulatory Visit (HOSPITAL_COMMUNITY): Payer: Self-pay | Admitting: Pharmacist

## 2016-10-18 ENCOUNTER — Telehealth (HOSPITAL_COMMUNITY): Payer: Self-pay | Admitting: *Deleted

## 2016-10-18 MED ORDER — SACUBITRIL-VALSARTAN 97-103 MG PO TABS: 1.0000 | ORAL_TABLET | Freq: Two times a day (BID) | ORAL | 5 refills | Status: DC

## 2016-10-18 NOTE — Telephone Encounter (Signed)
Patients daughter called concerned that [name] may have something to do with his latest two episodes of VT. Spoke with Doroteo Bradford and she stated that had nothing to do with his VT. Patients daughter aware and they will keep appt 6/26

## 2016-10-18 NOTE — Telephone Encounter (Signed)
Spoke with patients wife and informed her that her husbands episodes were reviewed with Dr. Rayann Heman but that at this time he does not recommend any changes. She verbalized understanding.

## 2016-10-19 ENCOUNTER — Other Ambulatory Visit: Payer: Self-pay | Admitting: Internal Medicine

## 2016-10-20 ENCOUNTER — Other Ambulatory Visit: Payer: Self-pay | Admitting: Internal Medicine

## 2016-10-20 ENCOUNTER — Other Ambulatory Visit: Payer: Self-pay

## 2016-10-20 NOTE — Telephone Encounter (Signed)
Pt's wife is calling requesting a refill on fenofibrate 160 mg tablet. Dr. Rayann Heman has never filled this medication. Would you like to refill this medication? Please advise

## 2016-10-20 NOTE — Telephone Encounter (Signed)
Per Claiborne Billings, RN, Dr. Jackalyn Lombard nurse, called pt and spoke with pt's wife, informing her, per, [facility], that pt would have to refill with CHF, Dr. Haroldine Laws and if pt is out of medication that pt can request a some medication until Monday, when Dr. Clayborne Dana office opens. Pt's wife was very upset that Dr. Rayann Heman could not refill this medication. Sending refill request to CHF. Please advise

## 2016-10-23 ENCOUNTER — Other Ambulatory Visit (HOSPITAL_COMMUNITY): Payer: Self-pay | Admitting: *Deleted

## 2016-10-25 ENCOUNTER — Ambulatory Visit (INDEPENDENT_AMBULATORY_CARE_PROVIDER_SITE_OTHER): Payer: Medicare Other | Admitting: Podiatry

## 2016-10-25 DIAGNOSIS — B353 Tinea pedis: Secondary | ICD-10-CM

## 2016-10-26 ENCOUNTER — Telehealth (HOSPITAL_COMMUNITY): Payer: Self-pay | Admitting: Cardiology

## 2016-10-26 MED ORDER — FENOFIBRATE 160 MG PO TABS: 160.0000 mg | ORAL_TABLET | Freq: Every evening | ORAL | 3 refills | Status: DC

## 2016-10-26 NOTE — Telephone Encounter (Signed)
Requested refill on REFILL VM Fenofibrate rx sent to pharmacy

## 2016-10-26 NOTE — Telephone Encounter (Signed)
Pt's medication has already been addressed by CHF clinic.

## 2016-10-27 NOTE — Progress Notes (Signed)
   HPI: Patient is a 71 year old male presenting today for follow-up evaluation of tinea pedis of the bilateral feet. He states he has been taking the Lamisil as well as using the Lotrisone as directed with significant improvement of his symptoms.   Physical Exam: General: The patient is alert and oriented x3 in no acute distress.  Dermatology: Skin is warm, dry and supple bilateral lower extremities. Negative for open lesions or macerations.  Vascular: Palpable pedal pulses bilaterally. No edema or erythema noted. Capillary refill within normal limits.  Neurological: Epicritic and protective threshold grossly intact bilaterally.   Musculoskeletal Exam: Range of motion within normal limits to all pedal and ankle joints bilateral. Muscle strength 5/5 in all groups bilateral.   Assessment: 1. Tinea Pedis bilaterally-resolved   Plan of Care:  1. Patient was evaluated. 2. Continue using Lotrisone cream when necessary. 3. Return to clinic when necessary.   Edrick Kins, DPM [facility]  Dr. Edrick Kins, [address]                                        [place], [place] [address]                Office [phone]  Fax [phone]

## 2016-10-30 ENCOUNTER — Other Ambulatory Visit (HOSPITAL_COMMUNITY): Payer: Self-pay | Admitting: *Deleted

## 2016-10-30 MED ORDER — FENOFIBRATE 160 MG PO TABS: 160.0000 mg | ORAL_TABLET | Freq: Every evening | ORAL | 3 refills | Status: DC
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
## 2016-11-08 ENCOUNTER — Ambulatory Visit (INDEPENDENT_AMBULATORY_CARE_PROVIDER_SITE_OTHER): Payer: Medicare Other | Admitting: Pulmonary Disease

## 2016-11-08 ENCOUNTER — Ambulatory Visit (INDEPENDENT_AMBULATORY_CARE_PROVIDER_SITE_OTHER): Payer: Medicare Other | Admitting: *Deleted

## 2016-11-08 ENCOUNTER — Encounter: Payer: Self-pay | Admitting: Pulmonary Disease

## 2016-11-08 VITALS — BP 120/60 | HR 56 | Ht 70.0 in | Wt 196.0 lb

## 2016-11-08 DIAGNOSIS — J439 Emphysema, unspecified: Secondary | ICD-10-CM

## 2016-11-08 DIAGNOSIS — G4733 Obstructive sleep apnea (adult) (pediatric): Secondary | ICD-10-CM | POA: Diagnosis not present

## 2016-11-08 DIAGNOSIS — J849 Interstitial pulmonary disease, unspecified: Secondary | ICD-10-CM

## 2016-11-08 DIAGNOSIS — Z9989 Dependence on other enabling machines and devices: Secondary | ICD-10-CM

## 2016-11-08 LAB — PULMONARY FUNCTION TEST
DL/VA % PRED: 76 %
DL/VA: 3.53 ml/min/mmHg/L
DLCO COR % PRED: 63 %
DLCO COR: 20.37 ml/min/mmHg
DLCO unc % pred: 61 %
DLCO unc: 19.96 ml/min/mmHg
FEF 25-75 PRE: 1.73 L/s
FEF2575-%Pred-Pre: 69 %
FEV1-%Pred-Pre: 87 %
FEV1-Pre: 2.53 L
FEV1FVC-%PRED-PRE: 87 %
FEV6-%Pred-Pre: 103 %
FEV6-Pre: 3.79 L
FEV6FVC-%Pred-Pre: 105 %
FVC-%PRED-PRE: 98 %
FVC-Pre: 3.79 L
PRE FEV6/FVC RATIO: 100 %
Pre FEV1/FVC ratio: 67 %

## 2016-11-08 NOTE — Progress Notes (Signed)
SIX MIN WALK 11/08/2016 08/08/2016 05/02/2016 01/26/2016  Medications Pt could not remember what medications he took the AM. eliquis,bidil,vimpat,myrbetriq Isosorb Dinitrate-7:30am Xyzal-7:30, Myrbetriq-7:30, entresto-7:30 All AM meds  Supplimental Oxygen during Test? (L/min) No No No No  Laps 7 9 8 9   Partial Lap (in Meters) 6 0 14 24  Baseline BP (sitting) 106/66 126/76 118/64 136/72  Baseline Heartrate 68 55 58 55  Baseline Dyspnea (Borg Scale) 3 8 3 4   Baseline Fatigue (Borg Scale) 0 3 8 2   Baseline SPO2 99 98 97 98  BP (sitting) 120/70 120/70 102/58 138/80  Heartrate 84 75 5 64  Dyspnea (Borg Scale) 3 6 2 6   Fatigue (Borg Scale) 0 4 - 7  SPO2 98 96 98 91  BP (sitting) 114/66 118/64 108/54 122/76  Heartrate 64 55 54 55  SPO2 100 96 98 98  Stopped or Paused before Six Minutes No No No No  Interpretation - - - Angina  Distance Completed [phone]  Tech Comments: - Pt walked a steady pace, he tolerated his walk well, he did express having some dyspnea during walk but did not stop or pause. He did not express any discomfort or pain during walk Pt. walked at a steady pace, he didn't take any breaks,  Pt completed test without any breaks.

## 2016-11-08 NOTE — Patient Instructions (Signed)
   Continue using your Symbicort and other medications as prescribed.  Let me know if you have any new breathing problems or need to be see sooner than your follow-up appointment.  I will see you back in 6 months or sooner if needed.   TESTS ORDERED: 1. Spirometry with bronchodilator challenge & DLCO at next appointment 2. 6MWT on room air at next appointment  3. CPAP download

## 2016-11-08 NOTE — Progress Notes (Signed)
PFT done today. 

## 2016-11-13 ENCOUNTER — Encounter (HOSPITAL_COMMUNITY): Payer: Self-pay

## 2016-11-13 ENCOUNTER — Emergency Department (HOSPITAL_COMMUNITY)
Admission: EM | Admit: 2016-11-13 | Discharge: 2016-11-13 | Disposition: A | Discharge status: Home / Self Care | Payer: Medicare Other | Attending: Emergency Medicine | Admitting: Emergency Medicine

## 2016-11-13 ENCOUNTER — Emergency Department (HOSPITAL_COMMUNITY): Payer: Medicare Other

## 2016-11-13 DIAGNOSIS — R079 Chest pain, unspecified: Secondary | ICD-10-CM | POA: Insufficient documentation

## 2016-11-13 DIAGNOSIS — Z5321 Procedure and treatment not carried out due to patient leaving prior to being seen by health care provider: Secondary | ICD-10-CM | POA: Insufficient documentation

## 2016-11-13 LAB — CBC
HCT: 43.4 % (ref 39.0–52.0)
Hemoglobin: 14.6 g/dL (ref 13.0–17.0)
MCH: 30.7 pg (ref 26.0–34.0)
MCHC: 33.6 g/dL (ref 30.0–36.0)
MCV: 91.2 fL (ref 78.0–100.0)
PLATELETS: 220 10*3/uL (ref 150–400)
RBC: 4.76 MIL/uL (ref 4.22–5.81)
RDW: 13.1 % (ref 11.5–15.5)
WBC: 7.6 10*3/uL (ref 4.0–10.5)

## 2016-11-13 LAB — BASIC METABOLIC PANEL
Anion gap: 6 (ref 5–15)
BUN: 18 mg/dL (ref 6–20)
CALCIUM: 10 mg/dL (ref 8.9–10.3)
CO2: 29 mmol/L (ref 22–32)
CREATININE: 1.71 mg/dL — AB (ref 0.61–1.24)
Chloride: 105 mmol/L (ref 101–111)
GFR calc Af Amer: 45 mL/min — ABNORMAL LOW (ref >60)
GFR calc non Af Amer: 39 mL/min — ABNORMAL LOW (ref >60)
GLUCOSE: 141 mg/dL — AB (ref 65–99)
Potassium: 4 mmol/L (ref 3.5–5.1)
Sodium: 140 mmol/L (ref 135–145)

## 2016-11-13 LAB — I-STAT TROPONIN, ED: TROPONIN I, POC: 0.01 ng/mL (ref 0.00–0.08)

## 2016-11-13 NOTE — ED Notes (Signed)
Pt called in waiting room and Triage X 3. No answer. Pt will be moved off of the floor (OTF).

## 2016-11-13 NOTE — ED Notes (Signed)
Patient was called for room again. After looking at [name], it appears that patient left the ED at 6:30pm - sent a text back stating that they had been waiting too long. Moved OTF and d/c.

## 2016-11-13 NOTE — ED Triage Notes (Signed)
Pt states he has been having chest pain with numbness in the left arm that began today. Pt states chest pain is gone at this time and numbness has improved. Pt took 2 nitro tablets PTA. He is a patient of Dr. Missy Sabins.

## 2016-11-14 ENCOUNTER — Other Ambulatory Visit (HOSPITAL_COMMUNITY): Payer: Self-pay | Admitting: *Deleted

## 2016-11-14 ENCOUNTER — Ambulatory Visit (HOSPITAL_BASED_OUTPATIENT_CLINIC_OR_DEPARTMENT_OTHER)
Admission: RE | Admit: 2016-11-14 | Discharge: 2016-11-14 | Disposition: A | Discharge status: Home / Self Care | Payer: Medicare Other | Source: Ambulatory Visit | Attending: Internal Medicine | Admitting: Internal Medicine

## 2016-11-14 ENCOUNTER — Encounter (HOSPITAL_COMMUNITY): Payer: Self-pay | Admitting: Internal Medicine

## 2016-11-14 ENCOUNTER — Ambulatory Visit (HOSPITAL_COMMUNITY)
Admission: RE | Admit: 2016-11-14 | Discharge: 2016-11-14 | Disposition: A | Discharge status: Home / Self Care | Payer: Medicare Other | Source: Ambulatory Visit | Attending: Adult Health | Admitting: Adult Health

## 2016-11-14 ENCOUNTER — Encounter (HOSPITAL_COMMUNITY): Payer: Self-pay | Admitting: *Deleted

## 2016-11-14 ENCOUNTER — Telehealth: Payer: Self-pay | Admitting: Pulmonary Disease

## 2016-11-14 VITALS — BP 116/66 | HR 56 | Wt 194.8 lb

## 2016-11-14 DIAGNOSIS — Z95810 Presence of automatic (implantable) cardiac defibrillator: Secondary | ICD-10-CM | POA: Insufficient documentation

## 2016-11-14 DIAGNOSIS — Z79899 Other long term (current) drug therapy: Secondary | ICD-10-CM | POA: Insufficient documentation

## 2016-11-14 DIAGNOSIS — N183 Chronic kidney disease, stage 3 unspecified: Secondary | ICD-10-CM

## 2016-11-14 DIAGNOSIS — M1990 Unspecified osteoarthritis, unspecified site: Secondary | ICD-10-CM | POA: Insufficient documentation

## 2016-11-14 DIAGNOSIS — Z7901 Long term (current) use of anticoagulants: Secondary | ICD-10-CM | POA: Diagnosis not present

## 2016-11-14 DIAGNOSIS — Z8673 Personal history of transient ischemic attack (TIA), and cerebral infarction without residual deficits: Secondary | ICD-10-CM | POA: Diagnosis not present

## 2016-11-14 DIAGNOSIS — Z825 Family history of asthma and other chronic lower respiratory diseases: Secondary | ICD-10-CM | POA: Insufficient documentation

## 2016-11-14 DIAGNOSIS — I5022 Chronic systolic (congestive) heart failure: Secondary | ICD-10-CM

## 2016-11-14 DIAGNOSIS — I2510 Atherosclerotic heart disease of native coronary artery without angina pectoris: Secondary | ICD-10-CM

## 2016-11-14 DIAGNOSIS — I480 Paroxysmal atrial fibrillation: Secondary | ICD-10-CM | POA: Diagnosis not present

## 2016-11-14 DIAGNOSIS — I252 Old myocardial infarction: Secondary | ICD-10-CM | POA: Insufficient documentation

## 2016-11-14 DIAGNOSIS — I130 Hypertensive heart and chronic kidney disease with heart failure and stage 1 through stage 4 chronic kidney disease, or unspecified chronic kidney disease: Secondary | ICD-10-CM | POA: Insufficient documentation

## 2016-11-14 DIAGNOSIS — G4733 Obstructive sleep apnea (adult) (pediatric): Secondary | ICD-10-CM | POA: Diagnosis not present

## 2016-11-14 DIAGNOSIS — I255 Ischemic cardiomyopathy: Secondary | ICD-10-CM | POA: Insufficient documentation

## 2016-11-14 DIAGNOSIS — Z87891 Personal history of nicotine dependence: Secondary | ICD-10-CM | POA: Diagnosis not present

## 2016-11-14 DIAGNOSIS — Z8249 Family history of ischemic heart disease and other diseases of the circulatory system: Secondary | ICD-10-CM | POA: Insufficient documentation

## 2016-11-14 DIAGNOSIS — R079 Chest pain, unspecified: Secondary | ICD-10-CM | POA: Diagnosis not present

## 2016-11-14 DIAGNOSIS — Z888 Allergy status to other drugs, medicaments and biological substances status: Secondary | ICD-10-CM | POA: Insufficient documentation

## 2016-11-14 DIAGNOSIS — Z841 Family history of disorders of kidney and ureter: Secondary | ICD-10-CM | POA: Diagnosis not present

## 2016-11-14 DIAGNOSIS — E785 Hyperlipidemia, unspecified: Secondary | ICD-10-CM | POA: Insufficient documentation

## 2016-11-14 DIAGNOSIS — K219 Gastro-esophageal reflux disease without esophagitis: Secondary | ICD-10-CM | POA: Diagnosis not present

## 2016-11-14 DIAGNOSIS — Z833 Family history of diabetes mellitus: Secondary | ICD-10-CM | POA: Diagnosis not present

## 2016-11-14 LAB — PROTIME-INR
INR: 1.19
PROTHROMBIN TIME: 15.2 s (ref 11.4–15.2)

## 2016-11-14 NOTE — Patient Instructions (Signed)
Your physician has requested that you have a cardiac catheterization. Cardiac catheterization is used to diagnose and/or treat various heart conditions. Doctors may recommend this procedure for a number of different reasons. The most common reason is to evaluate chest pain. Chest pain can be a symptom of coronary artery disease (CAD), and cardiac catheterization can show whether plaque is narrowing or blocking your heart's arteries. This procedure is also used to evaluate the valves, as well as measure the blood flow and oxygen levels in different parts of your heart. For further information please visit [email or web address]. Please follow instruction sheet, as given.  We will contact you in 3 months to schedule your next appointment.

## 2016-11-14 NOTE — Telephone Encounter (Signed)
CPAP COMPLIANCE DOWNLOAD 05/22 - 11/08/16:  70% usage >/= 4 hours. Average usage on days used 5 hours 37 minutes. CPAP 13 cm H2O pressure. Residual AHI 7.9 events/hour.
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
## 2016-11-16 ENCOUNTER — Encounter (HOSPITAL_COMMUNITY)
Admission: RE | Disposition: A | Discharge status: Home / Self Care | Payer: Self-pay | Source: Ambulatory Visit | Attending: Internal Medicine

## 2016-11-16 ENCOUNTER — Ambulatory Visit (HOSPITAL_COMMUNITY)
Admission: RE | Admit: 2016-11-16 | Discharge: 2016-11-16 | Disposition: A | Discharge status: Home / Self Care | Payer: Medicare Other | Source: Ambulatory Visit | Attending: Internal Medicine | Admitting: Internal Medicine

## 2016-11-16 DIAGNOSIS — N183 Chronic kidney disease, stage 3 (moderate): Secondary | ICD-10-CM | POA: Diagnosis not present

## 2016-11-16 DIAGNOSIS — I252 Old myocardial infarction: Secondary | ICD-10-CM | POA: Diagnosis not present

## 2016-11-16 DIAGNOSIS — Z7901 Long term (current) use of anticoagulants: Secondary | ICD-10-CM | POA: Insufficient documentation

## 2016-11-16 DIAGNOSIS — Z7951 Long term (current) use of inhaled steroids: Secondary | ICD-10-CM | POA: Diagnosis not present

## 2016-11-16 DIAGNOSIS — K219 Gastro-esophageal reflux disease without esophagitis: Secondary | ICD-10-CM | POA: Diagnosis not present

## 2016-11-16 DIAGNOSIS — Z95810 Presence of automatic (implantable) cardiac defibrillator: Secondary | ICD-10-CM | POA: Diagnosis not present

## 2016-11-16 DIAGNOSIS — Z8673 Personal history of transient ischemic attack (TIA), and cerebral infarction without residual deficits: Secondary | ICD-10-CM | POA: Diagnosis not present

## 2016-11-16 DIAGNOSIS — G4733 Obstructive sleep apnea (adult) (pediatric): Secondary | ICD-10-CM | POA: Diagnosis not present

## 2016-11-16 DIAGNOSIS — Z8249 Family history of ischemic heart disease and other diseases of the circulatory system: Secondary | ICD-10-CM | POA: Diagnosis not present

## 2016-11-16 DIAGNOSIS — M1990 Unspecified osteoarthritis, unspecified site: Secondary | ICD-10-CM | POA: Insufficient documentation

## 2016-11-16 DIAGNOSIS — I2510 Atherosclerotic heart disease of native coronary artery without angina pectoris: Secondary | ICD-10-CM | POA: Diagnosis not present

## 2016-11-16 DIAGNOSIS — I5022 Chronic systolic (congestive) heart failure: Secondary | ICD-10-CM

## 2016-11-16 DIAGNOSIS — I130 Hypertensive heart and chronic kidney disease with heart failure and stage 1 through stage 4 chronic kidney disease, or unspecified chronic kidney disease: Secondary | ICD-10-CM | POA: Diagnosis present

## 2016-11-16 DIAGNOSIS — I255 Ischemic cardiomyopathy: Secondary | ICD-10-CM | POA: Diagnosis not present

## 2016-11-16 DIAGNOSIS — E785 Hyperlipidemia, unspecified: Secondary | ICD-10-CM | POA: Diagnosis not present

## 2016-11-16 DIAGNOSIS — I480 Paroxysmal atrial fibrillation: Secondary | ICD-10-CM | POA: Diagnosis not present

## 2016-11-16 DIAGNOSIS — Z9861 Coronary angioplasty status: Secondary | ICD-10-CM | POA: Diagnosis not present

## 2016-11-16 DIAGNOSIS — Z87891 Personal history of nicotine dependence: Secondary | ICD-10-CM | POA: Diagnosis not present

## 2016-11-16 DIAGNOSIS — R079 Chest pain, unspecified: Secondary | ICD-10-CM

## 2016-11-16 HISTORY — PX: RIGHT/LEFT HEART CATH AND CORONARY ANGIOGRAPHY: CATH118266

## 2016-11-16 LAB — POCT I-STAT 3, ART BLOOD GAS (G3+)
ACID-BASE DEFICIT: 1 mmol/L (ref 0.0–2.0)
BICARBONATE: 24.3 mmol/L (ref 20.0–28.0)
O2 SAT: 98 %
PO2 ART: 111 mmHg — AB (ref 83.0–108.0)
TCO2: 25 mmol/L (ref 0–100)
pCO2 arterial: 39.7 mmHg (ref 32.0–48.0)
pH, Arterial: 7.395 (ref 7.350–7.450)

## 2016-11-16 LAB — POCT I-STAT 3, VENOUS BLOOD GAS (G3P V)
ACID-BASE EXCESS: 1 mmol/L (ref 0.0–2.0)
Bicarbonate: 25.9 mmol/L (ref 20.0–28.0)
Bicarbonate: 26.7 mmol/L (ref 20.0–28.0)
O2 SAT: 72 %
O2 Saturation: 69 %
PH VEN: 7.359 (ref 7.250–7.430)
TCO2: 27 mmol/L (ref 0–100)
TCO2: 28 mmol/L (ref 0–100)
pCO2, Ven: 46.8 mmHg (ref 44.0–60.0)
pCO2, Ven: 47.5 mmHg (ref 44.0–60.0)
pH, Ven: 7.351 (ref 7.250–7.430)
pO2, Ven: 38 mmHg (ref 32.0–45.0)
pO2, Ven: 40 mmHg (ref 32.0–45.0)

## 2016-11-16 SURGERY — RIGHT/LEFT HEART CATH AND CORONARY ANGIOGRAPHY
Anesthesia: LOCAL

## 2016-11-16 MED ORDER — SODIUM CHLORIDE 0.9 % IV SOLN
INTRAVENOUS | Status: DC
  Administered 2016-11-16: 12:00:00 via INTRAVENOUS

## 2016-11-16 MED ORDER — IOPAMIDOL (ISOVUE-370) INJECTION 76%
INTRAVENOUS | Status: AC
  Filled 2016-11-16: qty 100

## 2016-11-16 MED ORDER — LIDOCAINE HCL 1 % IJ SOLN
INTRAMUSCULAR | Status: AC
  Filled 2016-11-16: qty 20

## 2016-11-16 MED ORDER — SODIUM CHLORIDE 0.9% FLUSH: 3.0000 mL | INTRAVENOUS | Status: DC | PRN

## 2016-11-16 MED ORDER — FENTANYL CITRATE (PF) 100 MCG/2ML IJ SOLN
INTRAMUSCULAR | Status: DC | PRN
  Administered 2016-11-16 (×2): 25 ug via INTRAVENOUS

## 2016-11-16 MED ORDER — MIDAZOLAM HCL 2 MG/2ML IJ SOLN
INTRAMUSCULAR | Status: AC
Start: 2016-11-16 — End: 2016-11-16
  Filled 2016-11-16: qty 2

## 2016-11-16 MED ORDER — ASPIRIN 81 MG PO CHEW
81.0000 mg | CHEWABLE_TABLET | ORAL | Status: AC
  Administered 2016-11-16: 81 mg via ORAL

## 2016-11-16 MED ORDER — HEPARIN SODIUM (PORCINE) 1000 UNIT/ML IJ SOLN
INTRAMUSCULAR | Status: AC
  Filled 2016-11-16: qty 1

## 2016-11-16 MED ORDER — MIDAZOLAM HCL 2 MG/2ML IJ SOLN
INTRAMUSCULAR | Status: DC | PRN
  Administered 2016-11-16 (×2): 1 mg via INTRAVENOUS

## 2016-11-16 MED ORDER — FENTANYL CITRATE (PF) 100 MCG/2ML IJ SOLN
INTRAMUSCULAR | Status: AC
  Filled 2016-11-16: qty 2

## 2016-11-16 MED ORDER — SODIUM CHLORIDE 0.9 % IV SOLN: 250.0000 mL | INTRAVENOUS | Status: DC | PRN

## 2016-11-16 MED ORDER — IOPAMIDOL (ISOVUE-370) INJECTION 76%
INTRAVENOUS | Status: DC | PRN
  Administered 2016-11-16: 30 mL via INTRA_ARTERIAL

## 2016-11-16 MED ORDER — HEPARIN (PORCINE) IN NACL 2-0.9 UNIT/ML-% IJ SOLN
INTRAMUSCULAR | Status: AC
  Filled 2016-11-16: qty 1000

## 2016-11-16 MED ORDER — LIDOCAINE HCL (PF) 1 % IJ SOLN
INTRAMUSCULAR | Status: DC | PRN
Start: 2016-11-16 — End: 2016-11-16
  Administered 2016-11-16: 10 mL
  Administered 2016-11-16: 5 mL

## 2016-11-16 MED ORDER — ACETAMINOPHEN 325 MG PO TABS: 650.0000 mg | ORAL_TABLET | ORAL | Status: DC | PRN

## 2016-11-16 MED ORDER — ONDANSETRON HCL 4 MG/2ML IJ SOLN: 4.0000 mg | Freq: Four times a day (QID) | INTRAMUSCULAR | Status: DC | PRN

## 2016-11-16 MED ORDER — VERAPAMIL HCL 2.5 MG/ML IV SOLN
INTRAVENOUS | Status: AC
  Filled 2016-11-16: qty 2

## 2016-11-16 MED ORDER — HEPARIN (PORCINE) IN NACL 2-0.9 UNIT/ML-% IJ SOLN
INTRAMUSCULAR | Status: AC | PRN
  Administered 2016-11-16: 1000 mL

## 2016-11-16 MED ORDER — ASPIRIN 81 MG PO CHEW
CHEWABLE_TABLET | ORAL | Status: AC
  Administered 2016-11-16: 81 mg via ORAL
  Filled 2016-11-16: qty 1

## 2016-11-16 MED ORDER — VERAPAMIL HCL 2.5 MG/ML IV SOLN
INTRAVENOUS | Status: DC | PRN
  Administered 2016-11-16: 10 mL via INTRA_ARTERIAL

## 2016-11-16 MED ORDER — SODIUM CHLORIDE 0.9% FLUSH: 3.0000 mL | Freq: Two times a day (BID) | INTRAVENOUS | Status: DC

## 2016-11-16 MED ORDER — SODIUM CHLORIDE 0.9 % IV SOLN: INTRAVENOUS | Status: DC

## 2016-11-16 SURGICAL SUPPLY — 15 items
CATH 5FR JL3.5 JR4 ANG PIG MP (CATHETERS) ×1 IMPLANT
CATH BALLN WEDGE 5F 110CM (CATHETERS) ×1 IMPLANT
CATH INFINITI 5FR JL4 (CATHETERS) ×1 IMPLANT
DEVICE RAD COMP TR BAND LRG (VASCULAR PRODUCTS) ×1 IMPLANT
GLIDESHEATH SLEND SS 6F .021 (SHEATH) ×1 IMPLANT
GUIDEWIRE .025 260CM (WIRE) ×1 IMPLANT
GUIDEWIRE INQWIRE 1.5J.035X260 (WIRE) IMPLANT
INQWIRE 1.5J .035X260CM (WIRE) ×2
KIT HEART LEFT (KITS) ×2 IMPLANT
PACK CARDIAC CATHETERIZATION (CUSTOM PROCEDURE TRAY) ×2 IMPLANT
SHEATH GLIDE SLENDER 4/5FR (SHEATH) ×1 IMPLANT
SHEATH PINNACLE 5F 10CM (SHEATH) ×1 IMPLANT
TRANSDUCER W/STOPCOCK (MISCELLANEOUS) ×2 IMPLANT
TUBING CIL FLEX 10 FLL-RA (TUBING) ×2 IMPLANT
WIRE HI TORQ VERSACORE-J 145CM (WIRE) ×1 IMPLANT
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
## 2016-11-16 NOTE — Discharge Instructions (Signed)
Radial Site Care °Refer to this sheet in the next few weeks. These instructions provide you with information about caring for yourself after your procedure. Your health care provider may also give you more specific instructions. Your treatment has been planned according to current medical practices, but problems sometimes occur. Call your health care provider if you have any problems or questions after your procedure. °What can I expect after the procedure? °After your procedure, it is typical to have the following: °· Bruising at the radial site that usually fades within 1-2 weeks. °· Blood collecting in the tissue (hematoma) that may be painful to the touch. It should usually decrease in size and tenderness within 1-2 weeks. ° °Follow these instructions at home: °· Take medicines only as directed by your health care provider. °· You may shower 24-48 hours after the procedure or as directed by your health care provider. Remove the bandage (dressing) and gently wash the site with plain soap and water. Pat the area dry with a clean towel. Do not rub the site, because this may cause bleeding. °· Do not take baths, swim, or use a hot tub until your health care provider approves. °· Check your insertion site every day for redness, swelling, or drainage. °· Do not apply powder or lotion to the site. °· Do not flex or bend the affected arm for 24 hours or as directed by your health care provider. °· Do not push or pull heavy objects with the affected arm for 24 hours or as directed by your health care provider. °· Do not lift over 10 lb (4.5 kg) for 5 days after your procedure or as directed by your health care provider. °· Ask your health care provider when it is okay to: °? Return to work or school. °? Resume usual physical activities or sports. °? Resume sexual activity. °· Do not drive home if you are discharged the same day as the procedure. Have someone else drive you. °· You may drive 24 hours after the procedure  unless otherwise instructed by your health care provider. °· Do not operate machinery or power tools for 24 hours after the procedure. °· If your procedure was done as an outpatient procedure, which means that you went home the same day as your procedure, a responsible adult should be with you for the first 24 hours after you arrive home. °· Keep all follow-up visits as directed by your health care provider. This is important. °Contact a health care provider if: °· You have a fever. °· You have chills. °· You have increased bleeding from the radial site. Hold pressure on the site. CALL 911 °Get help right away if: °· You have unusual pain at the radial site. °· You have redness, warmth, or swelling at the radial site. °· You have drainage (other than a small amount of blood on the dressing) from the radial site. °· The radial site is bleeding, and the bleeding does not stop after 30 minutes of holding steady pressure on the site. °· Your arm or hand becomes pale, cool, tingly, or numb. °This information is not intended to replace advice given to you by your health care provider. Make sure you discuss any questions you have with your health care provider. °Document Released: 06/10/2010 Document Revised: 10/14/2015 Document Reviewed: 11/24/2013 °Elsevier Interactive Patient Education © 2018 Elsevier Inc. °Angiogram, Care After °This sheet gives you information about how to care for yourself after your procedure. Your health care provider may also give   you more specific instructions. If you have problems or questions, contact your health care provider. °What can I expect after the procedure? °After the procedure, it is common to have bruising and tenderness at the catheter insertion area. °Follow these instructions at home: °Insertion site care °· Follow instructions from your health care provider about how to take care of your insertion site. Make sure you: °? Wash your hands with soap and water before you change your  bandage (dressing). If soap and water are not available, use hand sanitizer. °? Change your dressing as told by your health care provider. °? Leave stitches (sutures), skin glue, or adhesive strips in place. These skin closures may need to stay in place for 2 weeks or longer. If adhesive strip edges start to loosen and curl up, you may trim the loose edges. Do not remove adhesive strips completely unless your health care provider tells you to do that. °· Do not take baths, swim, or use a hot tub until your health care provider approves. °· You may shower 24-48 hours after the procedure or as told by your health care provider. °? Gently wash the site with plain soap and water. °? Pat the area dry with a clean towel. °? Do not rub the site. This may cause bleeding. °· Do not apply powder or lotion to the site. Keep the site clean and dry. °· Check your insertion site every day for signs of infection. Check for: °? Redness, swelling, or pain. °? Fluid or blood. °? Warmth. °? Pus or a bad smell. °Activity °· Rest as told by your health care provider, usually for 1-2 days. °· Do not lift anything that is heavier than 10 lbs. (4.5 kg) or as told by your health care provider. °· Do not drive for 24 hours if you were given a medicine to help you relax (sedative). °· Do not drive or use heavy machinery while taking prescription pain medicine. °General instructions °· Return to your normal activities as told by your health care provider, usually in about a week. Ask your health care provider what activities are safe for you. °· If the catheter site starts bleeding, lie flat and put pressure on the site. If the bleeding does not stop, get help right away. This is a medical emergency. °· Drink enough fluid to keep your urine clear or pale yellow. This helps flush the contrast dye from your body. °· Take over-the-counter and prescription medicines only as told by your health care provider. °· Keep all follow-up visits as told by  your health care provider. This is important. °Contact a health care provider if: °· You have a fever or chills. °· You have redness, swelling, or pain around your insertion site. °· You have fluid or blood coming from your insertion site. °· The insertion site feels warm to the touch. °· You have pus or a bad smell coming from your insertion site. °· You have bruising around the insertion site. °· You notice blood collecting in the tissue around the catheter site (hematoma). The hematoma may be painful to the touch. °Get help right away if: °· You have severe pain at the catheter insertion area. °· The catheter insertion area swells very fast. °· The catheter insertion area is bleeding, and the bleeding does not stop when you hold steady pressure on the area. °· The area near or just beyond the catheter insertion site becomes pale, cool, tingly, or numb. °These symptoms may represent a serious problem   that is an emergency. Do not wait to see if the symptoms will go away. Get medical help right away. Call your local emergency services (911 in the [place] Do not drive yourself to the hospital. °Summary °· After the procedure, it is common to have bruising and tenderness at the catheter insertion area. °· After the procedure, it is important to rest and drink plenty of fluids. °· Do not take baths, swim, or use a hot tub until your health care provider says it is okay to do so. You may shower 24-48 hours after the procedure or as told by your health care provider. °· If the catheter site starts bleeding, lie flat and put pressure on the site. If the bleeding does not stop, get help right away. This is a medical emergency. °This information is not intended to replace advice given to you by your health care provider. Make sure you discuss any questions you have with your health care provider. °Document Released: 11/24/2004 Document Revised: 04/12/2016 Document Reviewed: 04/12/2016 °Elsevier Interactive Patient Education  © 2017 Elsevier Inc. ° °

## 2016-11-16 NOTE — Interval H&P Note (Signed)
History and Physical Interval Note:  11/16/2016 2:45 PM  [name]  has presented today for surgery, with the diagnosis of cp - hf  The various methods of treatment have been discussed with the patient and family. After consideration of risks, benefits and other options for treatment, the patient has consented to  Procedure(s): Right/Left Heart Cath and Coronary Angiography (N/A) and possible coronary angioplasty as a surgical intervention .  The patient's history has been reviewed, patient examined, no change in status, stable for surgery.  I have reviewed the patient's chart and labs.  Questions were answered to the patient's satisfaction.     Bensimhon, Quillian Quince

## 2016-11-17 ENCOUNTER — Encounter (HOSPITAL_COMMUNITY): Payer: Self-pay | Admitting: Internal Medicine

## 2016-11-21 ENCOUNTER — Telehealth: Payer: Self-pay

## 2016-11-21 NOTE — Telephone Encounter (Signed)
Spoke with pt regarding ATP episodes from 11/20/2016. Pt reported compliance with medications and denied any symptoms of CP or SOB. Pt voiced understanding of driving restrictions.

## 2016-11-27 ENCOUNTER — Telehealth: Payer: Self-pay

## 2016-11-27 ENCOUNTER — Other Ambulatory Visit (HOSPITAL_COMMUNITY): Payer: Self-pay | Admitting: Internal Medicine

## 2016-11-27 DIAGNOSIS — I482 Chronic atrial fibrillation, unspecified: Secondary | ICD-10-CM

## 2016-11-27 NOTE — Telephone Encounter (Signed)
Spoke with pt's wife informed her that Dr.Allred recommended pt f/u with him on Wednesday regarding ATP episodes, pt's wife agreeable to apt at 1:30.

## 2016-11-28 ENCOUNTER — Telehealth: Payer: Self-pay | Admitting: Cardiology

## 2016-11-28 NOTE — Telephone Encounter (Signed)
Pt wife called and stated that pt received therapies from device during the night. Pt feels fine, instructed pt wife to send a remote transmission w/ his home monitor. Pt wife verbalized understanding and is aware that once the remote transmission is received a Device Tech RN will review and call her back.

## 2016-11-28 NOTE — Telephone Encounter (Signed)
Spoke with patient's wife after reviewing remote transmission. I confirmed that he did receive a shock this morning. Patients wife states that he manages his medication but she has noticed some inconsistency in the pills he has taken. Patient has had multiple ATP/ NSVT episodes which were reviewed with JA yesterday. Patient has an appointment with JA tomorrow at 1:30. I instructed patient to call 911 if he gets a second shock but that we will maintain his currently scheduled appointment at this time. Patients wife verbalized understanding. GT agreeable to this plan.

## 2016-11-29 ENCOUNTER — Encounter: Payer: Self-pay | Admitting: Internal Medicine

## 2016-11-29 ENCOUNTER — Ambulatory Visit (INDEPENDENT_AMBULATORY_CARE_PROVIDER_SITE_OTHER): Payer: Medicare Other | Admitting: Internal Medicine

## 2016-11-29 VITALS — BP 120/66 | HR 57 | Ht 69.0 in | Wt 196.6 lb

## 2016-11-29 DIAGNOSIS — Z95810 Presence of automatic (implantable) cardiac defibrillator: Secondary | ICD-10-CM | POA: Diagnosis not present

## 2016-11-29 DIAGNOSIS — I4729 Other ventricular tachycardia: Secondary | ICD-10-CM

## 2016-11-29 DIAGNOSIS — I5022 Chronic systolic (congestive) heart failure: Secondary | ICD-10-CM | POA: Diagnosis not present

## 2016-11-29 DIAGNOSIS — I480 Paroxysmal atrial fibrillation: Secondary | ICD-10-CM | POA: Diagnosis not present

## 2016-11-29 DIAGNOSIS — I472 Ventricular tachycardia: Secondary | ICD-10-CM | POA: Diagnosis not present

## 2016-11-29 MED ORDER — SOTALOL HCL 80 MG PO TABS: 120.0000 mg | ORAL_TABLET | Freq: Every day | ORAL | 3 refills | Status: DC
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
## 2016-11-29 NOTE — Patient Instructions (Signed)
Medication Instructions:  Your physician has recommended you make the following change in your medication:  1) Increase Sotalol to 120 mg daily   Labwork: None ordered   Testing/Procedures: None ordered   Follow-Up: Your physician recommends that you schedule a follow-up appointment in: 3 months with Dr Rayann Heman   Any Other Special Instructions Will Be Listed Below (If Applicable).     If you need a refill on your cardiac medications before your next appointment, please call your pharmacy.

## 2016-11-30 LAB — CUP PACEART INCLINIC DEVICE CHECK
Brady Statistic RA Percent Paced: 28 %
Brady Statistic RV Percent Paced: 2 %
Date Time Interrogation Session: 20180711040000
HIGH POWER IMPEDANCE MEASURED VALUE: 51 Ohm
HighPow Impedance: 39 Ohm
Implantable Lead Implant Date: 20050922
Implantable Lead Location: 753859
Implantable Lead Location: 753860
Implantable Lead Serial Number: 156891
Lead Channel Impedance Value: 457 Ohm
Lead Channel Impedance Value: 507 Ohm
Lead Channel Pacing Threshold Pulse Width: 0.4 ms
Lead Channel Sensing Intrinsic Amplitude: 22.9 mV
Lead Channel Sensing Intrinsic Amplitude: 3.8 mV
Lead Channel Setting Pacing Amplitude: 2 V
Lead Channel Setting Pacing Pulse Width: 0.4 ms
Lead Channel Setting Sensing Sensitivity: 0.6 mV
MDC IDC LEAD IMPLANT DT: 20050922
MDC IDC MSMT LEADCHNL RA PACING THRESHOLD AMPLITUDE: 0.5 V
MDC IDC MSMT LEADCHNL RA PACING THRESHOLD PULSEWIDTH: 0.4 ms
MDC IDC MSMT LEADCHNL RV PACING THRESHOLD AMPLITUDE: 1 V
MDC IDC PG IMPLANT DT: 20100817
MDC IDC SET LEADCHNL RV PACING AMPLITUDE: 2.4 V
Pulse Gen Serial Number: 141895

## 2016-12-04 ENCOUNTER — Other Ambulatory Visit (HOSPITAL_COMMUNITY): Payer: Self-pay | Admitting: *Deleted

## 2016-12-04 ENCOUNTER — Telehealth: Payer: Self-pay | Admitting: Internal Medicine

## 2016-12-04 MED ORDER — SACUBITRIL-VALSARTAN 97-103 MG PO TABS: 1.0000 | ORAL_TABLET | Freq: Two times a day (BID) | ORAL | 5 refills | Status: DC

## 2016-12-04 NOTE — Telephone Encounter (Signed)
New message   Pt c/o medication issue:  1. Name of Medication: sotalol (BETAPACE) 80 MG tablet  2. How are you currently taking this medication (dosage and times per day)?  Once a day   3. Are you having a reaction (difficulty breathing--STAT)? Per wife stop medication on Sunday patient was out of it.   4. What is your medication issue? patient wife explain to Dr .Rayann Heman patient will have side effect.   Will discuss the ablation within 3 months.

## 2016-12-04 NOTE — Telephone Encounter (Signed)
Spoke with his wife and the Sotalol 1.5 tablets daily made him dizzy, out of it.  She has reduced him back to 80 mg daily and wants to move forward with the ablation.  Tried until Sunday and then stopped and Monday he was back to normal.

## 2016-12-07 ENCOUNTER — Telehealth: Payer: Self-pay

## 2016-12-07 ENCOUNTER — Ambulatory Visit
Admission: RE | Admit: 2016-12-07 | Discharge: 2016-12-07 | Disposition: A | Discharge status: Home / Self Care | Payer: Medicare Other | Source: Ambulatory Visit | Attending: Cardiology | Admitting: Cardiology

## 2016-12-07 DIAGNOSIS — W19XXXA Unspecified fall, initial encounter: Secondary | ICD-10-CM

## 2016-12-07 DIAGNOSIS — Y92009 Unspecified place in unspecified non-institutional (private) residence as the place of occurrence of the external cause: Principal | ICD-10-CM
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
## 2016-12-08 ENCOUNTER — Ambulatory Visit (INDEPENDENT_AMBULATORY_CARE_PROVIDER_SITE_OTHER): Payer: Medicare Other | Admitting: Adult Health

## 2016-12-08 ENCOUNTER — Ambulatory Visit (INDEPENDENT_AMBULATORY_CARE_PROVIDER_SITE_OTHER)
Admission: RE | Admit: 2016-12-08 | Discharge: 2016-12-08 | Disposition: A | Discharge status: Home / Self Care | Payer: Medicare Other | Source: Ambulatory Visit | Attending: Adult Health | Admitting: Adult Health

## 2016-12-08 ENCOUNTER — Ambulatory Visit
Admission: RE | Admit: 2016-12-08 | Discharge: 2016-12-08 | Disposition: A | Discharge status: Home / Self Care | Payer: Medicare Other | Source: Ambulatory Visit | Attending: Adult Health | Admitting: Adult Health

## 2016-12-08 ENCOUNTER — Encounter: Payer: Self-pay | Admitting: Adult Health

## 2016-12-08 VITALS — BP 130/70 | Temp 98.4°F | Wt 193.0 lb

## 2016-12-08 DIAGNOSIS — M25561 Pain in right knee: Secondary | ICD-10-CM

## 2016-12-08 DIAGNOSIS — M25551 Pain in right hip: Secondary | ICD-10-CM | POA: Diagnosis not present

## 2016-12-08 NOTE — Addendum Note (Signed)
Addended by: Apolinar Junes on: 12/08/2016 09:54 AM   Modules accepted: Orders

## 2016-12-08 NOTE — Telephone Encounter (Signed)
Returned call to patient and gave her the CT results.  Also she needs a letter from Dr Rayann Heman in regards to patient's health and how long he has been caring for him.

## 2016-12-08 NOTE — Telephone Encounter (Signed)
Follow up      Talk to [facility].  Calling to get CT scan results and ask a question regarding a form that was dropped off.
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
## 2016-12-11 ENCOUNTER — Telehealth: Payer: Self-pay | Admitting: Internal Medicine

## 2016-12-11 ENCOUNTER — Telehealth: Payer: Self-pay | Admitting: Adult Health

## 2016-12-11 NOTE — Telephone Encounter (Signed)
Spoke to the pt and informed him of x-ray result.  See result note.

## 2016-12-11 NOTE — Telephone Encounter (Signed)
[name] pt returning the call

## 2016-12-11 NOTE — Telephone Encounter (Signed)
Pt's wife requesting a letter to whom it may concern, stating patients medical condition. They are trying to get custody of their grandson, and have to go to mediation in [place], can't drive 30-74 hours or get on Airplane per wife, and need letter to post pone-pls call when ready-pls call [phone]

## 2016-12-11 NOTE — Telephone Encounter (Signed)
Discussed with Dr Rayann Heman and will have Melissa call him for an appointment to come in to discuss VT ablation.

## 2016-12-15 NOTE — Telephone Encounter (Signed)
[name] put a letter together for him to receive on Wednesday when I see him in clinic

## 2016-12-20 ENCOUNTER — Encounter: Payer: Medicare Other | Admitting: Internal Medicine

## 2016-12-21 ENCOUNTER — Other Ambulatory Visit: Payer: Self-pay | Admitting: Internal Medicine

## 2016-12-25 ENCOUNTER — Ambulatory Visit (INDEPENDENT_AMBULATORY_CARE_PROVIDER_SITE_OTHER): Payer: Medicare Other | Admitting: Internal Medicine

## 2016-12-25 ENCOUNTER — Other Ambulatory Visit: Payer: Self-pay | Admitting: Cardiology

## 2016-12-25 ENCOUNTER — Encounter: Payer: Self-pay | Admitting: *Deleted

## 2016-12-25 ENCOUNTER — Encounter: Payer: Self-pay | Admitting: Internal Medicine

## 2016-12-25 VITALS — BP 110/62 | HR 55 | Ht 69.0 in | Wt 195.4 lb

## 2016-12-25 DIAGNOSIS — I472 Ventricular tachycardia: Secondary | ICD-10-CM

## 2016-12-25 DIAGNOSIS — I5022 Chronic systolic (congestive) heart failure: Secondary | ICD-10-CM

## 2016-12-25 DIAGNOSIS — I255 Ischemic cardiomyopathy: Secondary | ICD-10-CM | POA: Diagnosis not present

## 2016-12-25 DIAGNOSIS — I480 Paroxysmal atrial fibrillation: Secondary | ICD-10-CM

## 2016-12-25 DIAGNOSIS — I4729 Other ventricular tachycardia: Secondary | ICD-10-CM

## 2016-12-25 LAB — CBC WITH DIFFERENTIAL/PLATELET
BASOS: 1 %
Basophils Absolute: 0 10*3/uL (ref 0.0–0.2)
EOS (ABSOLUTE): 0.1 10*3/uL (ref 0.0–0.4)
Eos: 2 %
Hematocrit: 39.6 % (ref 37.5–51.0)
Hemoglobin: 13.4 g/dL (ref 13.0–17.7)
Immature Grans (Abs): 0 10*3/uL (ref 0.0–0.1)
Immature Granulocytes: 0 %
LYMPHS ABS: 2.4 10*3/uL (ref 0.7–3.1)
Lymphs: 39 %
MCH: 29.8 pg (ref 26.6–33.0)
MCHC: 33.8 g/dL (ref 31.5–35.7)
MCV: 88 fL (ref 79–97)
MONOS ABS: 0.5 10*3/uL (ref 0.1–0.9)
Monocytes: 7 %
NEUTROS ABS: 3.1 10*3/uL (ref 1.4–7.0)
NEUTROS PCT: 51 %
PLATELETS: 261 10*3/uL (ref 150–379)
RBC: 4.49 x10E6/uL (ref 4.14–5.80)
RDW: 13.6 % (ref 12.3–15.4)
WBC: 6.2 10*3/uL (ref 3.4–10.8)

## 2016-12-25 LAB — BASIC METABOLIC PANEL
BUN/Creatinine Ratio: 11 (ref 10–24)
BUN: 19 mg/dL (ref 8–27)
CO2: 22 mmol/L (ref 20–29)
Calcium: 9.6 mg/dL (ref 8.6–10.2)
Chloride: 104 mmol/L (ref 96–106)
Creatinine, Ser: 1.72 mg/dL — ABNORMAL HIGH (ref 0.76–1.27)
GFR calc Af Amer: 46 mL/min/{1.73_m2} — ABNORMAL LOW (ref >59)
GFR, EST NON AFRICAN AMERICAN: 39 mL/min/{1.73_m2} — AB (ref >59)
Glucose: 113 mg/dL — ABNORMAL HIGH (ref 65–99)
POTASSIUM: 4.5 mmol/L (ref 3.5–5.2)
Sodium: 139 mmol/L (ref 134–144)

## 2016-12-25 MED ORDER — SOTALOL HCL 80 MG PO TABS: 80.0000 mg | ORAL_TABLET | Freq: Every day | ORAL | 3 refills | Status: DC

## 2016-12-25 NOTE — Patient Instructions (Signed)
Medication Instructions:  Your physician recommends that you continue on your current medications as directed. Please refer to the Current Medication list given to you today.   Labwork: Your physician recommends that you return for lab work today: BMP/CBC   Testing/Procedures: Your physician has recommended that you have an ablation. Catheter ablation is a medical procedure used to treat some cardiac arrhythmias (irregular heartbeats). During catheter ablation, a long, thin, flexible tube is put into a blood vessel in your groin (upper thigh), or neck. This tube is called an ablation catheter. It is then guided to your heart through the blood vessel. Radio frequency waves destroy small areas of heart tissue where abnormal heartbeats may cause an arrhythmia to start. Please see the instruction sheet given to you today.---12/28/16  Please arrive at [facility] at 7:30am Do not eat or drink after midnight the night prior to the procedure Do not take any medications the morning of the test Hold Eliquis for 2 days prior to procedure Plan for one night stay Will need someone to drive you home at discharge    Follow-Up: Your physician recommends that you schedule a follow-up appointment in: 4 weeks from 12/28/16 with Dr Rayann Heman   Any Other Special Instructions Will Be Listed Below (If Applicable).     If you need a refill on your cardiac medications before your next appointment, please call your pharmacy.
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
## 2016-12-27 ENCOUNTER — Other Ambulatory Visit (HOSPITAL_COMMUNITY): Payer: Self-pay | Admitting: Internal Medicine

## 2016-12-27 LAB — CUP PACEART INCLINIC DEVICE CHECK
HIGH POWER IMPEDANCE MEASURED VALUE: 46 Ohm
HighPow Impedance: 51 Ohm
Implantable Lead Location: 753860
Implantable Lead Model: 5076
Implantable Pulse Generator Implant Date: 20100817
Lead Channel Impedance Value: 493 Ohm
Lead Channel Pacing Threshold Pulse Width: 0.4 ms
Lead Channel Sensing Intrinsic Amplitude: 3.1 mV
Lead Channel Setting Pacing Amplitude: 2 V
Lead Channel Setting Pacing Pulse Width: 0.4 ms
MDC IDC LEAD IMPLANT DT: 20050922
MDC IDC LEAD IMPLANT DT: 20050922
MDC IDC LEAD LOCATION: 753859
MDC IDC LEAD SERIAL: 156891
MDC IDC MSMT LEADCHNL RA PACING THRESHOLD AMPLITUDE: 0.5 V
MDC IDC MSMT LEADCHNL RA PACING THRESHOLD PULSEWIDTH: 0.4 ms
MDC IDC MSMT LEADCHNL RV IMPEDANCE VALUE: 449 Ohm
MDC IDC MSMT LEADCHNL RV PACING THRESHOLD AMPLITUDE: 1 V
MDC IDC MSMT LEADCHNL RV SENSING INTR AMPL: 20.8 mV
MDC IDC SESS DTM: 20180806040000
MDC IDC SET LEADCHNL RV PACING AMPLITUDE: 2.4 V
MDC IDC SET LEADCHNL RV SENSING SENSITIVITY: 0.6 mV
Pulse Gen Serial Number: 141895

## 2016-12-28 ENCOUNTER — Ambulatory Visit (HOSPITAL_COMMUNITY): Payer: Medicare Other | Admitting: Certified Registered Nurse Anesthetist

## 2016-12-28 ENCOUNTER — Encounter (HOSPITAL_COMMUNITY)
Admission: RE | Disposition: A | Discharge status: Home / Self Care | Payer: Self-pay | Source: Ambulatory Visit | Attending: Internal Medicine

## 2016-12-28 ENCOUNTER — Encounter (HOSPITAL_COMMUNITY): Payer: Self-pay | Admitting: Certified Registered Nurse Anesthetist

## 2016-12-28 ENCOUNTER — Ambulatory Visit (HOSPITAL_COMMUNITY)
Admission: RE | Admit: 2016-12-28 | Discharge: 2016-12-29 | Disposition: A | Discharge status: Home / Self Care | Payer: Medicare Other | Source: Ambulatory Visit | Attending: Internal Medicine | Admitting: Internal Medicine

## 2016-12-28 DIAGNOSIS — Z7951 Long term (current) use of inhaled steroids: Secondary | ICD-10-CM | POA: Diagnosis not present

## 2016-12-28 DIAGNOSIS — I2510 Atherosclerotic heart disease of native coronary artery without angina pectoris: Secondary | ICD-10-CM | POA: Diagnosis not present

## 2016-12-28 DIAGNOSIS — I252 Old myocardial infarction: Secondary | ICD-10-CM | POA: Insufficient documentation

## 2016-12-28 DIAGNOSIS — I130 Hypertensive heart and chronic kidney disease with heart failure and stage 1 through stage 4 chronic kidney disease, or unspecified chronic kidney disease: Secondary | ICD-10-CM | POA: Diagnosis not present

## 2016-12-28 DIAGNOSIS — N183 Chronic kidney disease, stage 3 (moderate): Secondary | ICD-10-CM | POA: Diagnosis not present

## 2016-12-28 DIAGNOSIS — Z79899 Other long term (current) drug therapy: Secondary | ICD-10-CM | POA: Diagnosis not present

## 2016-12-28 DIAGNOSIS — Z7901 Long term (current) use of anticoagulants: Secondary | ICD-10-CM | POA: Diagnosis not present

## 2016-12-28 DIAGNOSIS — I472 Ventricular tachycardia, unspecified: Secondary | ICD-10-CM

## 2016-12-28 DIAGNOSIS — I255 Ischemic cardiomyopathy: Secondary | ICD-10-CM | POA: Diagnosis not present

## 2016-12-28 DIAGNOSIS — Z8673 Personal history of transient ischemic attack (TIA), and cerebral infarction without residual deficits: Secondary | ICD-10-CM | POA: Insufficient documentation

## 2016-12-28 DIAGNOSIS — E785 Hyperlipidemia, unspecified: Secondary | ICD-10-CM | POA: Insufficient documentation

## 2016-12-28 DIAGNOSIS — I5022 Chronic systolic (congestive) heart failure: Secondary | ICD-10-CM | POA: Insufficient documentation

## 2016-12-28 DIAGNOSIS — I481 Persistent atrial fibrillation: Secondary | ICD-10-CM | POA: Insufficient documentation

## 2016-12-28 DIAGNOSIS — G4733 Obstructive sleep apnea (adult) (pediatric): Secondary | ICD-10-CM | POA: Diagnosis not present

## 2016-12-28 HISTORY — PX: V TACH ABLATION: EP1227

## 2016-12-28 LAB — POCT ACTIVATED CLOTTING TIME
ACTIVATED CLOTTING TIME: 197 s
ACTIVATED CLOTTING TIME: 219 s
ACTIVATED CLOTTING TIME: 246 s
ACTIVATED CLOTTING TIME: 180 s

## 2016-12-28 SURGERY — V TACH ABLATION
Anesthesia: Monitor Anesthesia Care

## 2016-12-28 MED ORDER — LIDOCAINE HCL (PF) 1 % IJ SOLN
INTRAMUSCULAR | Status: DC | PRN
  Administered 2016-12-28: 60 mL

## 2016-12-28 MED ORDER — HEPARIN (PORCINE) IN NACL 2-0.9 UNIT/ML-% IJ SOLN
INTRAMUSCULAR | Status: AC
  Filled 2016-12-28: qty 500

## 2016-12-28 MED ORDER — HEPARIN SODIUM (PORCINE) 1000 UNIT/ML IJ SOLN
INTRAMUSCULAR | Status: AC
  Filled 2016-12-28: qty 1

## 2016-12-28 MED ORDER — TAMSULOSIN HCL 0.4 MG PO CAPS
0.4000 mg | ORAL_CAPSULE | Freq: Every day | ORAL | Status: DC
  Administered 2016-12-28: 0.4 mg via ORAL
  Filled 2016-12-28: qty 1

## 2016-12-28 MED ORDER — SODIUM CHLORIDE 0.9% FLUSH
3.0000 mL | Freq: Two times a day (BID) | INTRAVENOUS | Status: DC
  Administered 2016-12-28 – 2016-12-29 (×2): 3 mL via INTRAVENOUS

## 2016-12-28 MED ORDER — HEPARIN SODIUM (PORCINE) 1000 UNIT/ML IJ SOLN
INTRAMUSCULAR | Status: DC | PRN
  Administered 2016-12-28: 8000 [IU] via INTRAVENOUS
  Administered 2016-12-28: 5000 [IU] via INTRAVENOUS
  Administered 2016-12-28: 4000 [IU] via INTRAVENOUS
  Administered 2016-12-28: 3000 [IU] via INTRAVENOUS

## 2016-12-28 MED ORDER — SODIUM CHLORIDE 0.9 % IV SOLN
INTRAVENOUS | Status: DC
  Administered 2016-12-28 (×2): via INTRAVENOUS

## 2016-12-28 MED ORDER — SOTALOL HCL 80 MG PO TABS
80.0000 mg | ORAL_TABLET | Freq: Every day | ORAL | Status: DC
  Administered 2016-12-28: 80 mg via ORAL
  Filled 2016-12-28: qty 1

## 2016-12-28 MED ORDER — PHENYLEPHRINE HCL 10 MG/ML IJ SOLN
INTRAVENOUS | Status: DC | PRN
  Administered 2016-12-28: 30 ug/min via INTRAVENOUS
  Administered 2016-12-28: 13:00:00 via INTRAVENOUS

## 2016-12-28 MED ORDER — ONDANSETRON HCL 4 MG/2ML IJ SOLN: 4.0000 mg | Freq: Once | INTRAMUSCULAR | Status: DC | PRN

## 2016-12-28 MED ORDER — METOPROLOL SUCCINATE ER 50 MG PO TB24
50.0000 mg | ORAL_TABLET | Freq: Every day | ORAL | Status: DC
  Administered 2016-12-29: 50 mg via ORAL
  Filled 2016-12-28: qty 1

## 2016-12-28 MED ORDER — GLYCOPYRROLATE 0.2 MG/ML IJ SOLN
INTRAMUSCULAR | Status: DC | PRN
  Administered 2016-12-28 (×2): .2 mg via INTRAVENOUS

## 2016-12-28 MED ORDER — PROTAMINE SULFATE 10 MG/ML IV SOLN
INTRAVENOUS | Status: DC | PRN
  Administered 2016-12-28: 20 mg via INTRAVENOUS
  Administered 2016-12-28: 10 mg via INTRAVENOUS

## 2016-12-28 MED ORDER — APIXABAN 5 MG PO TABS
5.0000 mg | ORAL_TABLET | Freq: Two times a day (BID) | ORAL | Status: DC
  Administered 2016-12-29: 5 mg via ORAL
  Filled 2016-12-28: qty 1

## 2016-12-28 MED ORDER — FENTANYL CITRATE (PF) 100 MCG/2ML IJ SOLN
12.5000 ug | Freq: Once | INTRAMUSCULAR | Status: AC
  Administered 2016-12-28: 12.5 ug via INTRAVENOUS

## 2016-12-28 MED ORDER — PROPOFOL 10 MG/ML IV BOLUS
INTRAVENOUS | Status: DC | PRN
  Administered 2016-12-28: 20 mg via INTRAVENOUS
  Administered 2016-12-28: 10 mg via INTRAVENOUS

## 2016-12-28 MED ORDER — ALBUTEROL SULFATE (2.5 MG/3ML) 0.083% IN NEBU
3.0000 mL | INHALATION_SOLUTION | RESPIRATORY_TRACT | Status: DC | PRN
  Administered 2016-12-28: 3 mL via RESPIRATORY_TRACT
  Filled 2016-12-28: qty 3

## 2016-12-28 MED ORDER — ONDANSETRON HCL 4 MG/2ML IJ SOLN: 4.0000 mg | Freq: Four times a day (QID) | INTRAMUSCULAR | Status: DC | PRN

## 2016-12-28 MED ORDER — LIDOCAINE HCL (PF) 1 % IJ SOLN
INTRAMUSCULAR | Status: AC
  Filled 2016-12-28: qty 30

## 2016-12-28 MED ORDER — SODIUM CHLORIDE 0.9 % IV SOLN: 250.0000 mL | INTRAVENOUS | Status: DC | PRN

## 2016-12-28 MED ORDER — MIRABEGRON ER 25 MG PO TB24
25.0000 mg | ORAL_TABLET | Freq: Every day | ORAL | Status: DC
  Administered 2016-12-29: 25 mg via ORAL
  Filled 2016-12-28: qty 1

## 2016-12-28 MED ORDER — ISOSORB DINITRATE-HYDRALAZINE 20-37.5 MG PO TABS
2.0000 | ORAL_TABLET | Freq: Three times a day (TID) | ORAL | Status: DC
Start: 2016-12-29 — End: 2016-12-29
  Administered 2016-12-29: 2 via ORAL
  Filled 2016-12-28: qty 2

## 2016-12-28 MED ORDER — FENTANYL CITRATE (PF) 100 MCG/2ML IJ SOLN
INTRAMUSCULAR | Status: AC
  Filled 2016-12-28: qty 2

## 2016-12-28 MED ORDER — FINASTERIDE 5 MG PO TABS
5.0000 mg | ORAL_TABLET | Freq: Every day | ORAL | Status: DC
  Administered 2016-12-28: 5 mg via ORAL
  Filled 2016-12-28: qty 1

## 2016-12-28 MED ORDER — ACETAMINOPHEN 325 MG PO TABS
650.0000 mg | ORAL_TABLET | ORAL | Status: DC | PRN
  Administered 2016-12-28: 650 mg via ORAL

## 2016-12-28 MED ORDER — EPHEDRINE SULFATE 50 MG/ML IJ SOLN
INTRAMUSCULAR | Status: DC | PRN
  Administered 2016-12-28: 10 mg via INTRAVENOUS

## 2016-12-28 MED ORDER — FLUTICASONE FUROATE-VILANTEROL 200-25 MCG/INH IN AEPB
1.0000 | INHALATION_SPRAY | Freq: Every day | RESPIRATORY_TRACT | Status: DC
  Administered 2016-12-29: 1 via RESPIRATORY_TRACT
  Filled 2016-12-28: qty 28

## 2016-12-28 MED ORDER — PROPOFOL 500 MG/50ML IV EMUL
INTRAVENOUS | Status: DC | PRN
  Administered 2016-12-28 (×2): via INTRAVENOUS
  Administered 2016-12-28: 100 ug/kg/min via INTRAVENOUS

## 2016-12-28 MED ORDER — HEPARIN SODIUM (PORCINE) 1000 UNIT/ML IJ SOLN
INTRAMUSCULAR | Status: DC | PRN
  Administered 2016-12-28: 1000 [IU] via INTRAVENOUS

## 2016-12-28 MED ORDER — PHENYLEPHRINE HCL 10 MG/ML IJ SOLN
INTRAMUSCULAR | Status: DC | PRN
  Administered 2016-12-28: 120 ug via INTRAVENOUS
  Administered 2016-12-28: 320 ug via INTRAVENOUS
  Administered 2016-12-28: 120 ug via INTRAVENOUS
  Administered 2016-12-28 (×4): 160 ug via INTRAVENOUS
  Administered 2016-12-28: 320 ug via INTRAVENOUS
  Administered 2016-12-28: 80 ug via INTRAVENOUS

## 2016-12-28 MED ORDER — SODIUM CHLORIDE 0.9% FLUSH: 3.0000 mL | INTRAVENOUS | Status: DC | PRN

## 2016-12-28 MED ORDER — LIDOCAINE HCL (CARDIAC) 20 MG/ML IV SOLN
INTRAVENOUS | Status: DC | PRN
  Administered 2016-12-28: 40 mg via INTRATRACHEAL

## 2016-12-28 MED ORDER — ACETAMINOPHEN 325 MG PO TABS
ORAL_TABLET | ORAL | Status: AC
  Filled 2016-12-28: qty 2

## 2016-12-28 MED ORDER — FENTANYL CITRATE (PF) 100 MCG/2ML IJ SOLN
INTRAMUSCULAR | Status: DC | PRN
  Administered 2016-12-28 (×2): 25 ug via INTRAVENOUS

## 2016-12-28 MED ORDER — HEPARIN (PORCINE) IN NACL 2-0.9 UNIT/ML-% IJ SOLN
INTRAMUSCULAR | Status: AC | PRN
  Administered 2016-12-28: 1000 mL

## 2016-12-28 MED ORDER — SACUBITRIL-VALSARTAN 97-103 MG PO TABS
1.0000 | ORAL_TABLET | Freq: Two times a day (BID) | ORAL | Status: DC
  Administered 2016-12-29: 1 via ORAL
  Filled 2016-12-28: qty 1

## 2016-12-28 MED ORDER — LACOSAMIDE 50 MG PO TABS
50.0000 mg | ORAL_TABLET | Freq: Two times a day (BID) | ORAL | Status: DC
  Administered 2016-12-28 – 2016-12-29 (×2): 50 mg via ORAL
  Filled 2016-12-28 (×2): qty 1

## 2016-12-28 SURGICAL SUPPLY — 16 items
BAG SNAP BAND KOVER 36X36 (MISCELLANEOUS) ×2 IMPLANT
BLANKET WARM UNDERBOD FULL ACC (MISCELLANEOUS) ×2 IMPLANT
CATH JOSEPHSON QUAD-ALLRED 6FR (CATHETERS) ×1 IMPLANT
CATH MAPPNG PENTARAY F 2-6-2MM (CATHETERS) IMPLANT
CATH SMTCH THERMOCOOL SF DF (CATHETERS) ×1 IMPLANT
CATH WEBSTER BI DIR CS D-F CRV (CATHETERS) ×1 IMPLANT
PACK EP LATEX FREE (CUSTOM PROCEDURE TRAY) ×2
PACK EP LF (CUSTOM PROCEDURE TRAY) ×1 IMPLANT
PAD DEFIB LIFELINK (PAD) ×2 IMPLANT
PATCH CARTO3 (PAD) ×1 IMPLANT
PENTARAY F 2-6-2MM (CATHETERS) ×2
SHEATH PINNACLE 6F 10CM (SHEATH) ×1 IMPLANT
SHEATH PINNACLE 7F 10CM (SHEATH) ×1 IMPLANT
SHEATH PINNACLE 8F 10CM (SHEATH) ×2 IMPLANT
SHIELD RADPAD SCOOP 12X17 (MISCELLANEOUS) ×2 IMPLANT
TUBING SMART ABLATE COOLFLOW (TUBING) ×1 IMPLANT

## 2016-12-28 NOTE — Anesthesia Preprocedure Evaluation (Addendum)
Anesthesia Evaluation  Patient identified by MRN, date of birth, ID band Patient awake    Reviewed: Allergy & Precautions, NPO status , Patient's Chart, lab work & pertinent test results  Airway Mallampati: III  TM Distance: >3 FB Neck ROM: Full    Dental no notable dental hx. (+) Upper Dentures, Lower Dentures   Pulmonary shortness of breath and with exertion, sleep apnea and Continuous Positive Airway Pressure Ventilation , COPD,  COPD inhaler, former smoker,    Pulmonary exam normal breath sounds clear to auscultation       Cardiovascular hypertension, Pt. on medications and Pt. 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 CVA, Residual Symptoms negative psych ROS   GI/Hepatic GERD  Medicated and Controlled,  Endo/Other    Renal/GU Renal InsufficiencyRenal disease  negative genitourinary   Musculoskeletal  (+) Arthritis , Osteoarthritis,    Abdominal   Peds  Hematology   Anesthesia Other Findings   Reproductive/Obstetrics                             Anesthesia Physical Anesthesia Plan  ASA: III  Anesthesia Plan: MAC   Post-op Pain Management:    Induction: Intravenous  PONV Risk Score and Plan: 2 and Ondansetron, Promethazine and Treatment may vary due to age or medical condition  Airway Management Planned: Natural Airway, Nasal Cannula and Simple Face Mask  Additional Equipment:   Intra-op Plan:   Post-operative Plan:   Informed Consent: I have reviewed the patients History and Physical, chart, labs and discussed the procedure including the risks, benefits and alternatives  for the proposed anesthesia with the patient or authorized representative who has indicated his/her understanding and acceptance.   Dental advisory given  Plan Discussed with: CRNA, Anesthesiologist and Surgeon  Anesthesia Plan Comments:        Anesthesia Quick Evaluation

## 2016-12-28 NOTE — Anesthesia Postprocedure Evaluation (Signed)
Anesthesia Post Note  Patient: [name]  Procedure(s) Performed: Procedure(s) (LRB): V Tach Ablation (N/A)     Patient location during evaluation: Cath Lab Anesthesia Type: MAC Level of consciousness: awake and alert and oriented Pain management: pain level controlled Vital Signs Assessment: post-procedure vital signs reviewed and stable Respiratory status: spontaneous breathing, nonlabored ventilation, respiratory function stable and patient connected to nasal cannula oxygen Cardiovascular status: stable and blood pressure returned to baseline Anesthetic complications: no    Last Vitals:  Vitals:   12/28/16 1350 12/28/16 1355  BP: 121/75 111/66  Pulse: 81 80  Resp: 18 18  Temp:    SpO2: 100% 97%    Last Pain:  Vitals:   12/28/16 1346  TempSrc: Temporal  PainSc:                  [name]

## 2016-12-28 NOTE — Progress Notes (Signed)
Site area: rt groin fa sheath and 3 fv sheaths Site Prior to Removal:  Level 0 Pressure Applied For: 25 minutes Manual:   yes Patient Status During Pull:  stable Post Pull Site:  Level  0 Post Pull Instructions Given:  yes Post Pull Pulses Present: palpable Dressing Applied:  Gauze and tegaderm Bedrest begins @ 1440 Comments:

## 2016-12-28 NOTE — Anesthesia Procedure Notes (Signed)
Procedure Name: MAC Date/Time: 12/28/2016 10:30 AM Performed by: Salli Quarry Aairah Negrette Pre-anesthesia Checklist: Patient identified, Emergency Drugs available, Suction available and Patient being monitored Patient Re-evaluated:Patient Re-evaluated prior to induction Oxygen Delivery Method: Simple face mask
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
## 2016-12-28 NOTE — Interval H&P Note (Signed)
History and Physical Interval Note:  12/28/2016 7:52 AM  [name]  has presented today for surgery, with the diagnosis of vt  The various methods of treatment have been discussed with the patient and family. After consideration of risks, benefits and other options for treatment, the patient has consented to  Procedure(s): V Tach Ablation (N/A) as a surgical intervention .  The patient's history has been reviewed, patient examined, no change in status, stable for surgery.  I have reviewed the patient's chart and labs.  Questions were answered to the patient's satisfaction.     [name]

## 2016-12-28 NOTE — Transfer of Care (Signed)
Immediate Anesthesia Transfer of Care Note  Patient: [name]  Procedure(s) Performed: Procedure(s): V Tach Ablation (N/A)  Patient Location: Cath Lab  Anesthesia Type:MAC  Level of Consciousness: awake, alert  and patient cooperative  Airway & Oxygen Therapy: Patient Spontanous Breathing and Patient connected to face mask oxygen  Post-op Assessment: Report given to RN and Post -op Vital signs reviewed and stable  Post vital signs: Reviewed and stable  Last Vitals:  Vitals:   12/28/16 0725 12/28/16 1346  BP: 120/73   Pulse: (!) 54   Resp: 20   Temp: 36.5 C 36.5 C  SpO2: 97%     Last Pain:  Vitals:   12/28/16 1346  TempSrc: Temporal  PainSc:          Complications: No apparent anesthesia complications

## 2016-12-28 NOTE — Discharge Summary (Signed)
ELECTROPHYSIOLOGY PROCEDURE DISCHARGE SUMMARY    [name],  MRN: [id number], DOB/AGE: [birth year] 71 y.o.  Admit date: 12/28/2016 Discharge date: 12/29/2016  Primary Care Physician: Dorothyann Peng, NP Primary Cardiologist: [facility] Electrophysiologist: Montrelle Eddings  Primary Discharge Diagnosis:  Ventricular tachycardia s/p ablation this admission  Secondary Discharge Diagnosis: 1.  Persistent atrial fibrillation 2.  CKD, stage III 3.  HTN 4.  Ischemic cardiomyopathy 5.  CAD 6.  Prior stroke 7.  OSA 8.  CHF  Allergies  Allergen Reactions  . Aricept [Donepezil Hcl] Other (See Comments)    Worsens renal function  . Codeine Hives  . Nexium [Esomeprazole] Other (See Comments)    Severely worsens renal function  . Omeprazole Hives  . Pantoprazole Sodium Other (See Comments)    Renal failure  . Tramadol Nausea And Vomiting    Procedures This Admission:  1.  Electrophysiology study and radiofrequency catheter ablation on 12/28/16 by Dr Rayann Heman.  This study demonstrated sinus rhythm upon presentation; inducible ventricular tachycardia with a cycle length of 385 milliseconds and 370, which was felt to be the clinical tachycardias. VT1 was a right bundle-branch R superior axis VT with a Q-wave in V5 and V6.  VT2 was a RBBB superior axis without Q waves in V5 or V6. These tachycardia was mapped and successfully ablated along the inferoapical lateral portion of the left ventricle; amoderate sized inferoapical lateral LV scar was demonstrated with voltage mapping; extensive substrate modification with radiofrequency ablation performed along the LV scar; no inducible VT post ablation; no early apparent complications.  Brief HPI:  [name] is a 71 y.o. male with a past medical history as outlined above. He has had recurrent VT despite medical therapy.  Risks, benefits to EP study and ablation were reviewed with the patient who wished to proceed.  Hospital Course:   The patient was admitted and underwent EPS/ablation on 12/28/16 by Dr Rayann Heman with details as outlined above. He was monitored on telemetry overnight which demonstrated sinus rhythm, 1 4 beat run NSVT.  Groin was without complication the morning of discharge. He was seen by Dr Rayann Heman and considered stable for discharge to home. He will have follow up in the office in 4 weeks.   Physical Exam: Vitals:   12/28/16 1647 12/28/16 2105 12/28/16 2147 12/29/16 0431  BP: 121/83 (!) 147/71  131/88  Pulse:  63  65  Resp:  18  18  Temp:  98 F (36.7 C)  99.1 F (37.3 C)  TempSrc:  Oral  Oral  SpO2:  100% 99% 99%  Weight:    194 lb 6.4 oz (88.2 kg)  Height:        GEN- The patient is well appearing, alert and oriented x 3 today.   HEENT: normocephalic, atraumatic; sclera clear, conjunctiva pink; hearing intact; oropharynx clear; neck supple Lungs- Clear to ausculation bilaterally, normal work of breathing.  No wheezes, rales, rhonchi Heart- Regular rate and rhythm, no murmurs, rubs or gallops GI- soft, non-tender, non-distended, bowel sounds present Extremities- no clubbing, cyanosis, or edema; DP/PT/radial pulses 2+ bilaterally MS- no significant deformity or atrophy Skin- warm and dry, no rash or lesion Psych- euthymic mood, full affect Neuro- strength and sensation are intact    Labs:   Lab Results  Component Value Date   WBC 6.2 12/25/2016   HGB 13.4 12/25/2016   HCT 39.6 12/25/2016   MCV 88 12/25/2016   PLT 261 12/25/2016     Recent Labs Lab 12/25/16  1211  NA 139  K 4.5  CL 104  CO2 22  BUN 19  CREATININE 1.72*  CALCIUM 9.6  GLUCOSE 113*     Discharge Medications:  Current Discharge Medication List    CONTINUE these medications which have NOT CHANGED   Details  albuterol (PROVENTIL HFA;VENTOLIN HFA) 108 (90 Base) MCG/ACT inhaler Inhale 1-2 puffs into the lungs every 4 (four) hours as needed for wheezing or shortness of breath (or cough). Qty: 1 Inhaler, Refills: 3      benzonatate (TESSALON) 100 MG capsule Take 1 capsule (100 mg total) by mouth 3 (three) times daily. Qty: 20 capsule, Refills: 0   Associated Diagnoses: Cough    clotrimazole-betamethasone (LOTRISONE) cream Apply 1 application topically 2 (two) times daily. Qty: 30 g, Refills: 2    ELIQUIS 5 MG TABS tablet TAKE ONE TABLET BY MOUTH TWICE DAILY Qty: 60 tablet, Refills: 6   Associated Diagnoses: Chronic atrial fibrillation ([name])    fenofibrate 160 MG tablet Take 1 tablet (160 mg total) by mouth every evening. Qty: 90 tablet, Refills: 3    finasteride (PROSCAR) 5 MG tablet Take 1 tablet (5 mg total) by mouth daily. Qty: 90 tablet, Refills: 3    furosemide (LASIX) 20 MG tablet Take 1 tablet (20 mg total) by mouth 3 (three) times a week. Monday, Wednesday, and Friday Qty: 45 tablet, Refills: 3    isosorbide-hydrALAZINE (BIDIL) 20-37.5 MG tablet Take 2 tablets by mouth 3 (three) times daily. Qty: 180 tablet, Refills: 3    lacosamide (VIMPAT) 50 MG TABS tablet Take 1 tablet (50 mg total) by mouth 2 (two) times daily. Qty: 60 tablet, Refills: 5   Associated Diagnoses: Localization-related symptomatic epilepsy and epileptic syndromes with complex partial seizures, not intractable, without status epilepticus ([name])    metoprolol succinate (TOPROL-XL) 50 MG 24 hr tablet take 1 tablet by mouth once daily Qty: 90 tablet, Refills: 1    MYRBETRIQ 25 MG TB24 tablet take 1 tablet by mouth once daily Qty: 30 tablet, Refills: 2    rosuvastatin (CRESTOR) 40 MG tablet Take 1 tablet (40 mg total) by mouth every evening. Qty: 90 tablet, Refills: 3    sacubitril-valsartan (ENTRESTO) 97-103 MG Take 1 tablet by mouth 2 (two) times daily. Qty: 60 tablet, Refills: 5    sotalol (BETAPACE) 80 MG tablet Take 1 tablet (80 mg total) by mouth daily. Qty: 90 tablet, Refills: 3    tamsulosin (FLOMAX) 0.4 MG CAPS capsule Take 0.4 mg by mouth at bedtime.     Blood Pressure Monitoring (BLOOD PRESSURE CUFF)  MISC 1 each by Does not apply route daily. Qty: 1 each, Refills: 0   Associated Diagnoses: Essential hypertension    budesonide-formoterol (SYMBICORT) 160-4.5 MCG/ACT inhaler Inhale 2 puffs into the lungs 2 (two) times daily. Qty: 1 Inhaler, Refills: 6    levocetirizine (XYZAL) 5 MG tablet Take 1 tablet (5 mg total) by mouth every evening. Qty: 30 tablet, Refills: 5    nitroGLYCERIN (NITROSTAT) 0.4 MG SL tablet Place 0.4 mg under the tongue every 5 (five) minutes x 3 doses as needed for chest pain.    Spacer/Aero-Holding Chambers (AEROCHAMBER MV) inhaler Use as instructed Qty: 1 each, Refills: 0        Disposition: Pt is being discharged home today in good condition. Discharge Instructions    Diet - low sodium heart healthy    Complete by:  As directed    Increase activity slowly    Complete by:  As directed  Follow-up Information    [name], MD Follow up on 01/29/2017.   Specialty:  Cardiology Why:  at 12:30PM  Contact information: [place] [address] [phone]           Duration of Discharge Encounter: Greater than 30 minutes including physician time.  Signed, Chanetta Marshall, NP 12/29/2016 7:06 AM  I have seen, examined the patient, and reviewed the above assessment and plan.  On exam, RRR.  Changes to above are made where necessary.   Doing well s/p VT ablation.  Resume home medicines.  I will see in 4 weeks.  Co Sign: [name], MD 12/29/2016 3:06 PM

## 2016-12-29 ENCOUNTER — Encounter (HOSPITAL_COMMUNITY): Payer: Self-pay | Admitting: Internal Medicine

## 2016-12-29 DIAGNOSIS — I130 Hypertensive heart and chronic kidney disease with heart failure and stage 1 through stage 4 chronic kidney disease, or unspecified chronic kidney disease: Secondary | ICD-10-CM | POA: Diagnosis not present

## 2016-12-29 DIAGNOSIS — I472 Ventricular tachycardia: Secondary | ICD-10-CM | POA: Diagnosis not present

## 2016-12-29 DIAGNOSIS — I481 Persistent atrial fibrillation: Secondary | ICD-10-CM | POA: Diagnosis not present

## 2016-12-29 DIAGNOSIS — N183 Chronic kidney disease, stage 3 (moderate): Secondary | ICD-10-CM | POA: Diagnosis not present

## 2016-12-29 NOTE — Progress Notes (Signed)
The patient has been given discharge instructions along with a new medication list and what to take today. He has follow up appointments. He has been educated of ablations site care. He is discharging with his wife via car.   Fredrich Birks RN

## 2017-01-01 ENCOUNTER — Telehealth: Payer: Self-pay | Admitting: *Deleted

## 2017-01-01 MED ORDER — MEXILETINE HCL 200 MG PO CAPS
200.0000 mg | ORAL_CAPSULE | Freq: Two times a day (BID) | ORAL | 5 refills | Status: DC
Start: 2017-01-01 — End: 2017-01-08

## 2017-01-01 NOTE — Telephone Encounter (Signed)
Reviewed episode with Dr. Caryl Comes and he gave the following verbal order: add Mexilitine 200mg  by mouth twice daily.  I made [name] aware- she is agreeable and asks that I send the new Rx to [facility].

## 2017-01-01 NOTE — Telephone Encounter (Signed)
Mrs. Christopherson calling because Mr. Routh ICD shocked him overnight (episode 1:40am). His brother passed away yesterday and he was upset. 2 other episodes of VT terminated with ATP 12/30/16. I made her aware that Dr. Rayann Heman was on vacation this week but I would review the episodes with Dr. Caryl Comes who was in the office this afternoon. I would call back if there were any recommendations made, I would not call back if not. I made her aware of no driving x 6 months and that he continue his meds as prescribed at discharge. She verbalizes understanding.

## 2017-01-03 ENCOUNTER — Emergency Department (HOSPITAL_COMMUNITY): Payer: Medicare Other

## 2017-01-03 ENCOUNTER — Encounter (HOSPITAL_COMMUNITY): Payer: Self-pay

## 2017-01-03 ENCOUNTER — Observation Stay (HOSPITAL_COMMUNITY)
Admission: EM | Admit: 2017-01-03 | Discharge: 2017-01-03 | Disposition: A | Discharge status: Home / Self Care | Payer: Medicare Other | Attending: Family Medicine | Admitting: Family Medicine

## 2017-01-03 ENCOUNTER — Telehealth (HOSPITAL_COMMUNITY): Payer: Self-pay | Admitting: *Deleted

## 2017-01-03 ENCOUNTER — Other Ambulatory Visit: Payer: Self-pay | Admitting: Internal Medicine

## 2017-01-03 DIAGNOSIS — N183 Chronic kidney disease, stage 3 unspecified: Secondary | ICD-10-CM | POA: Diagnosis present

## 2017-01-03 DIAGNOSIS — R569 Unspecified convulsions: Secondary | ICD-10-CM | POA: Insufficient documentation

## 2017-01-03 DIAGNOSIS — G4733 Obstructive sleep apnea (adult) (pediatric): Secondary | ICD-10-CM | POA: Diagnosis not present

## 2017-01-03 DIAGNOSIS — Z9889 Other specified postprocedural states: Secondary | ICD-10-CM | POA: Insufficient documentation

## 2017-01-03 DIAGNOSIS — I700 Atherosclerosis of aorta: Secondary | ICD-10-CM | POA: Insufficient documentation

## 2017-01-03 DIAGNOSIS — Z885 Allergy status to narcotic agent status: Secondary | ICD-10-CM | POA: Insufficient documentation

## 2017-01-03 DIAGNOSIS — Z7951 Long term (current) use of inhaled steroids: Secondary | ICD-10-CM | POA: Insufficient documentation

## 2017-01-03 DIAGNOSIS — I482 Chronic atrial fibrillation: Secondary | ICD-10-CM

## 2017-01-03 DIAGNOSIS — Z7901 Long term (current) use of anticoagulants: Secondary | ICD-10-CM | POA: Diagnosis not present

## 2017-01-03 DIAGNOSIS — I10 Essential (primary) hypertension: Secondary | ICD-10-CM

## 2017-01-03 DIAGNOSIS — K219 Gastro-esophageal reflux disease without esophagitis: Secondary | ICD-10-CM | POA: Insufficient documentation

## 2017-01-03 DIAGNOSIS — Z841 Family history of disorders of kidney and ureter: Secondary | ICD-10-CM | POA: Insufficient documentation

## 2017-01-03 DIAGNOSIS — H547 Unspecified visual loss: Secondary | ICD-10-CM | POA: Diagnosis not present

## 2017-01-03 DIAGNOSIS — I5022 Chronic systolic (congestive) heart failure: Secondary | ICD-10-CM | POA: Diagnosis not present

## 2017-01-03 DIAGNOSIS — Z833 Family history of diabetes mellitus: Secondary | ICD-10-CM | POA: Insufficient documentation

## 2017-01-03 DIAGNOSIS — Z87891 Personal history of nicotine dependence: Secondary | ICD-10-CM | POA: Insufficient documentation

## 2017-01-03 DIAGNOSIS — N400 Enlarged prostate without lower urinary tract symptoms: Secondary | ICD-10-CM | POA: Diagnosis not present

## 2017-01-03 DIAGNOSIS — I472 Ventricular tachycardia: Secondary | ICD-10-CM | POA: Diagnosis not present

## 2017-01-03 DIAGNOSIS — H543 Unqualified visual loss, both eyes: Secondary | ICD-10-CM | POA: Diagnosis present

## 2017-01-03 DIAGNOSIS — I4891 Unspecified atrial fibrillation: Secondary | ICD-10-CM | POA: Diagnosis not present

## 2017-01-03 DIAGNOSIS — Z8673 Personal history of transient ischemic attack (TIA), and cerebral infarction without residual deficits: Secondary | ICD-10-CM | POA: Insufficient documentation

## 2017-01-03 DIAGNOSIS — I130 Hypertensive heart and chronic kidney disease with heart failure and stage 1 through stage 4 chronic kidney disease, or unspecified chronic kidney disease: Secondary | ICD-10-CM | POA: Diagnosis not present

## 2017-01-03 DIAGNOSIS — Z9861 Coronary angioplasty status: Secondary | ICD-10-CM | POA: Insufficient documentation

## 2017-01-03 DIAGNOSIS — J439 Emphysema, unspecified: Secondary | ICD-10-CM | POA: Insufficient documentation

## 2017-01-03 DIAGNOSIS — I255 Ischemic cardiomyopathy: Secondary | ICD-10-CM | POA: Insufficient documentation

## 2017-01-03 DIAGNOSIS — Z87898 Personal history of other specified conditions: Secondary | ICD-10-CM | POA: Diagnosis not present

## 2017-01-03 DIAGNOSIS — E785 Hyperlipidemia, unspecified: Secondary | ICD-10-CM | POA: Insufficient documentation

## 2017-01-03 DIAGNOSIS — Z8349 Family history of other endocrine, nutritional and metabolic diseases: Secondary | ICD-10-CM | POA: Insufficient documentation

## 2017-01-03 DIAGNOSIS — M19011 Primary osteoarthritis, right shoulder: Secondary | ICD-10-CM | POA: Insufficient documentation

## 2017-01-03 DIAGNOSIS — Z95810 Presence of automatic (implantable) cardiac defibrillator: Secondary | ICD-10-CM | POA: Diagnosis not present

## 2017-01-03 DIAGNOSIS — H538 Other visual disturbances: Secondary | ICD-10-CM

## 2017-01-03 DIAGNOSIS — I471 Supraventricular tachycardia: Secondary | ICD-10-CM | POA: Insufficient documentation

## 2017-01-03 DIAGNOSIS — Z9989 Dependence on other enabling machines and devices: Secondary | ICD-10-CM

## 2017-01-03 DIAGNOSIS — Z79899 Other long term (current) drug therapy: Secondary | ICD-10-CM | POA: Insufficient documentation

## 2017-01-03 DIAGNOSIS — Z888 Allergy status to other drugs, medicaments and biological substances status: Secondary | ICD-10-CM | POA: Insufficient documentation

## 2017-01-03 DIAGNOSIS — I252 Old myocardial infarction: Secondary | ICD-10-CM | POA: Insufficient documentation

## 2017-01-03 DIAGNOSIS — Z836 Family history of other diseases of the respiratory system: Secondary | ICD-10-CM | POA: Insufficient documentation

## 2017-01-03 DIAGNOSIS — E782 Mixed hyperlipidemia: Secondary | ICD-10-CM | POA: Diagnosis present

## 2017-01-03 DIAGNOSIS — Z8249 Family history of ischemic heart disease and other diseases of the circulatory system: Secondary | ICD-10-CM | POA: Insufficient documentation

## 2017-01-03 LAB — I-STAT CHEM 8, ED
BUN: 15 mg/dL (ref 6–20)
CREATININE: 1.6 mg/dL — AB (ref 0.61–1.24)
Calcium, Ion: 1.22 mmol/L (ref 1.15–1.40)
Chloride: 101 mmol/L (ref 101–111)
Glucose, Bld: 113 mg/dL — ABNORMAL HIGH (ref 65–99)
HEMATOCRIT: 42 % (ref 39.0–52.0)
Hemoglobin: 14.3 g/dL (ref 13.0–17.0)
Potassium: 4.3 mmol/L (ref 3.5–5.1)
SODIUM: 140 mmol/L (ref 135–145)
TCO2: 26 mmol/L (ref 0–100)

## 2017-01-03 LAB — COMPREHENSIVE METABOLIC PANEL
ALBUMIN: 4.2 g/dL (ref 3.5–5.0)
ALT: 25 U/L (ref 17–63)
AST: 30 U/L (ref 15–41)
Alkaline Phosphatase: 37 U/L — ABNORMAL LOW (ref 38–126)
Anion gap: 8 (ref 5–15)
BUN: 13 mg/dL (ref 6–20)
CHLORIDE: 104 mmol/L (ref 101–111)
CO2: 28 mmol/L (ref 22–32)
Calcium: 9.7 mg/dL (ref 8.9–10.3)
Creatinine, Ser: 1.7 mg/dL — ABNORMAL HIGH (ref 0.61–1.24)
GFR calc Af Amer: 45 mL/min — ABNORMAL LOW (ref >60)
GFR, EST NON AFRICAN AMERICAN: 39 mL/min — AB (ref >60)
GLUCOSE: 119 mg/dL — AB (ref 65–99)
POTASSIUM: 4.3 mmol/L (ref 3.5–5.1)
SODIUM: 140 mmol/L (ref 135–145)
Total Bilirubin: 0.8 mg/dL (ref 0.3–1.2)
Total Protein: 7.3 g/dL (ref 6.5–8.1)

## 2017-01-03 LAB — PROTIME-INR
INR: 1.1
Prothrombin Time: 14.3 seconds (ref 11.4–15.2)

## 2017-01-03 LAB — CBC
HCT: 40.5 % (ref 39.0–52.0)
Hemoglobin: 13.7 g/dL (ref 13.0–17.0)
MCH: 30.2 pg (ref 26.0–34.0)
MCHC: 33.8 g/dL (ref 30.0–36.0)
MCV: 89.2 fL (ref 78.0–100.0)
PLATELETS: 246 10*3/uL (ref 150–400)
RBC: 4.54 MIL/uL (ref 4.22–5.81)
RDW: 13.3 % (ref 11.5–15.5)
WBC: 6.7 10*3/uL (ref 4.0–10.5)

## 2017-01-03 LAB — DIFFERENTIAL
BASOS ABS: 0 10*3/uL (ref 0.0–0.1)
BASOS PCT: 0 %
EOS ABS: 0.2 10*3/uL (ref 0.0–0.7)
Eosinophils Relative: 3 %
Lymphocytes Relative: 35 %
Lymphs Abs: 2.3 10*3/uL (ref 0.7–4.0)
Monocytes Absolute: 0.6 10*3/uL (ref 0.1–1.0)
Monocytes Relative: 9 %
NEUTROS PCT: 53 %
Neutro Abs: 3.5 10*3/uL (ref 1.7–7.7)

## 2017-01-03 LAB — I-STAT TROPONIN, ED: TROPONIN I, POC: 0.44 ng/mL — AB (ref 0.00–0.08)

## 2017-01-03 LAB — APTT: APTT: 29 s (ref 24–36)

## 2017-01-03 MED ORDER — METOPROLOL SUCCINATE ER 50 MG PO TB24: 50.0000 mg | ORAL_TABLET | Freq: Every day | ORAL | Status: DC

## 2017-01-03 MED ORDER — FUROSEMIDE 20 MG PO TABS: 20.0000 mg | ORAL_TABLET | ORAL | Status: DC

## 2017-01-03 MED ORDER — ACETAMINOPHEN 650 MG RE SUPP: 650.0000 mg | Freq: Four times a day (QID) | RECTAL | Status: DC | PRN

## 2017-01-03 MED ORDER — SACUBITRIL-VALSARTAN 97-103 MG PO TABS: 1.0000 | ORAL_TABLET | Freq: Two times a day (BID) | ORAL | Status: DC

## 2017-01-03 MED ORDER — IOPAMIDOL (ISOVUE-370) INJECTION 76%
INTRAVENOUS | Status: AC
  Administered 2017-01-03: 50 mL
  Filled 2017-01-03: qty 50

## 2017-01-03 MED ORDER — FINASTERIDE 5 MG PO TABS: 5.0000 mg | ORAL_TABLET | Freq: Every day | ORAL | Status: DC

## 2017-01-03 MED ORDER — ONDANSETRON HCL 4 MG/2ML IJ SOLN: 4.0000 mg | Freq: Four times a day (QID) | INTRAMUSCULAR | Status: DC | PRN

## 2017-01-03 MED ORDER — LACOSAMIDE 50 MG PO TABS: 50.0000 mg | ORAL_TABLET | Freq: Two times a day (BID) | ORAL | Status: DC

## 2017-01-03 MED ORDER — MEXILETINE HCL 200 MG PO CAPS: 200.0000 mg | ORAL_CAPSULE | Freq: Two times a day (BID) | ORAL | Status: DC

## 2017-01-03 MED ORDER — MIRABEGRON ER 25 MG PO TB24: 25.0000 mg | ORAL_TABLET | Freq: Every day | ORAL | Status: DC

## 2017-01-03 MED ORDER — APIXABAN 5 MG PO TABS: 5.0000 mg | ORAL_TABLET | Freq: Two times a day (BID) | ORAL | Status: DC

## 2017-01-03 MED ORDER — TAMSULOSIN HCL 0.4 MG PO CAPS: 0.4000 mg | ORAL_CAPSULE | Freq: Every day | ORAL | Status: DC

## 2017-01-03 MED ORDER — ACETAMINOPHEN 325 MG PO TABS: 650.0000 mg | ORAL_TABLET | Freq: Four times a day (QID) | ORAL | Status: DC | PRN

## 2017-01-03 MED ORDER — SODIUM CHLORIDE 0.9 % IV SOLN: INTRAVENOUS | Status: DC

## 2017-01-03 MED ORDER — ASPIRIN 325 MG PO TABS: 325.0000 mg | ORAL_TABLET | Freq: Every day | ORAL | Status: DC

## 2017-01-03 MED ORDER — MOMETASONE FURO-FORMOTEROL FUM 200-5 MCG/ACT IN AERO: 2.0000 | INHALATION_SPRAY | Freq: Two times a day (BID) | RESPIRATORY_TRACT | Status: DC

## 2017-01-03 MED ORDER — SOTALOL HCL 80 MG PO TABS
80.0000 mg | ORAL_TABLET | Freq: Every day | ORAL | Status: DC
Start: 2017-01-03 — End: 2017-01-03

## 2017-01-03 MED ORDER — LEVOCETIRIZINE DIHYDROCHLORIDE 5 MG PO TABS: 5.0000 mg | ORAL_TABLET | Freq: Every day | ORAL | Status: DC | PRN

## 2017-01-03 MED ORDER — ONDANSETRON HCL 4 MG PO TABS: 4.0000 mg | ORAL_TABLET | Freq: Four times a day (QID) | ORAL | Status: DC | PRN

## 2017-01-03 MED ORDER — ALBUTEROL SULFATE HFA 108 (90 BASE) MCG/ACT IN AERS: 1.0000 | INHALATION_SPRAY | RESPIRATORY_TRACT | Status: DC | PRN

## 2017-01-03 MED ORDER — ASPIRIN 300 MG RE SUPP: 300.0000 mg | Freq: Every day | RECTAL | Status: DC

## 2017-01-03 MED ORDER — ISOSORB DINITRATE-HYDRALAZINE 20-37.5 MG PO TABS: 2.0000 | ORAL_TABLET | Freq: Two times a day (BID) | ORAL | Status: DC

## 2017-01-03 MED ORDER — ROSUVASTATIN CALCIUM 40 MG PO TABS: 40.0000 mg | ORAL_TABLET | Freq: Every evening | ORAL | Status: DC

## 2017-01-03 NOTE — ED Notes (Signed)
Pt transported to CT ?

## 2017-01-03 NOTE — Progress Notes (Signed)
  Asked to be seen by Dr. Thomasene Lot as our office sent him to ED via a phone call.   Pt had signs and symptoms concerning for TIA vs amaurosis fugax with "black vision" over the weekend that has since resolve. He states he residual blurred vision bilaterally, that seems to have improved. Initial CT head unremarkable for any acute change. CTA pending.   Pt brother died this weekend in hospice care. Pt initially adamant that he would leave. I discussed at length the risk of major stroke after TIA, and the concerning symptoms he has exhibited so far.   Pt agrees to stay overnight for further neurological work up.  He will leave in the am as his brothers funeral is at 3 pm some distance away.   Will notify EP of his presence here.   Physical Exam General: Elderly appearing. No resp difficulty. HEENT: Normal Neck: Supple. JVP 6-7. Carotids 2+ bilat; no bruits. No thyromegaly or nodule noted. Cor: PMI nondisplaced. RRR, No M/G/R noted Lungs: CTAB, normal effort. Abdomen: Soft, non-tender, non-distended, no HSM. No bruits or masses. +BS  Extremities: No cyanosis, clubbing, rash, R and LLE no edema.  Neuro: Alert & orientedx3, Gross vision and cranial nerves grossly intact. moves all 4 extremities w/o difficulty. Affect pleasant. No focal neurological deficit on my exam.  His HF seems well compensated at this time.   He should keep his appointment with Dr. Rayann Heman for next week.    The HF team will follow at a distance. Please don't hesitate to call with any additional questions.   Discussed all above with Dr. Haroldine Laws.   Legrand Como [address] [place], PA-C 01/03/2017 4:25 PM   Advanced Heart Failure Team Pager [phone] (M-F; [place])  Please contact [facility] for night-coverage after hours (4p -7a ) and weekends on [email or web address]  Agree. Patient left AMA on the night of admission.   Glori Bickers, MD  8:20 PM

## 2017-01-03 NOTE — ED Notes (Signed)
Family at bedside. 
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
## 2017-01-03 NOTE — ED Notes (Signed)
Pt placement called and notified of plans to d/c pt.

## 2017-01-03 NOTE — ED Notes (Signed)
Pt sts Neurologist told him he was to be discharged. EDP made aware, EDP stated he was not told about this plan. Neuro paged.

## 2017-01-03 NOTE — ED Notes (Signed)
Neuro at bedside.
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
## 2017-01-03 NOTE — Telephone Encounter (Signed)
Patients wife called from the eye doctor with patient. Patient had an ablation 12/28/16 and started having blurred vision on Sunday his vison went completely black and then became blurry again. His ICD shocked him Sunday 8/12/168. On Monday 01/01/17 his vision went black again and then back blurry.  Patients wife then made him an eye doctor appt. His opthalmologist thought this was more or a Vascular problem than vision.  Pts wife called Dr.Allreds office and was told he was out of the office and his next available appt was next week. Per Nira Conn I advised her to take patient to the Emergency room to be evaluated for stroke and/or clots opthalmologist office agrees with plan. Pts wife agreed to take patient to the emergency dept.

## 2017-01-03 NOTE — ED Provider Notes (Signed)
I spoke with Dr.Aroor, neurologist who feels the patient can be discharged. Patient is asymptomatic and feels well and is in agreement. He can follow up with his primary care physician as needed Results for orders placed or performed during the hospital encounter of 01/03/17  Protime-INR  Result Value Ref Range   Prothrombin Time 14.3 11.4 - 15.2 seconds   INR 1.10   APTT  Result Value Ref Range   aPTT 29 24 - 36 seconds  CBC  Result Value Ref Range   WBC 6.7 4.0 - 10.5 K/uL   RBC 4.54 4.22 - 5.81 MIL/uL   Hemoglobin 13.7 13.0 - 17.0 g/dL   HCT 40.5 39.0 - 52.0 %   MCV 89.2 78.0 - 100.0 fL   MCH 30.2 26.0 - 34.0 pg   MCHC 33.8 30.0 - 36.0 g/dL   RDW 13.3 11.5 - 15.5 %   Platelets 246 150 - 400 K/uL  Differential  Result Value Ref Range   Neutrophils Relative % 53 %   Neutro Abs 3.5 1.7 - 7.7 K/uL   Lymphocytes Relative 35 %   Lymphs Abs 2.3 0.7 - 4.0 K/uL   Monocytes Relative 9 %   Monocytes Absolute 0.6 0.1 - 1.0 K/uL   Eosinophils Relative 3 %   Eosinophils Absolute 0.2 0.0 - 0.7 K/uL   Basophils Relative 0 %   Basophils Absolute 0.0 0.0 - 0.1 K/uL  Comprehensive metabolic panel  Result Value Ref Range   Sodium 140 135 - 145 mmol/L   Potassium 4.3 3.5 - 5.1 mmol/L   Chloride 104 101 - 111 mmol/L   CO2 28 22 - 32 mmol/L   Glucose, Bld 119 (H) 65 - 99 mg/dL   BUN 13 6 - 20 mg/dL   Creatinine, Ser 1.70 (H) 0.61 - 1.24 mg/dL   Calcium 9.7 8.9 - 10.3 mg/dL   Total Protein 7.3 6.5 - 8.1 g/dL   Albumin 4.2 3.5 - 5.0 g/dL   AST 30 15 - 41 U/L   ALT 25 17 - 63 U/L   Alkaline Phosphatase 37 (L) 38 - 126 U/L   Total Bilirubin 0.8 0.3 - 1.2 mg/dL   GFR calc non Af Amer 39 (L) >60 mL/min   GFR calc Af Amer 45 (L) >60 mL/min   Anion gap 8 5 - 15  I-stat troponin, ED  Result Value Ref Range   Troponin i, poc 0.44 (HH) 0.00 - 0.08 ng/mL   Comment NOTIFIED PHYSICIAN    Comment 3          I-Stat Chem 8, ED  Result Value Ref Range   Sodium 140 135 - 145 mmol/L   Potassium  4.3 3.5 - 5.1 mmol/L   Chloride 101 101 - 111 mmol/L   BUN 15 6 - 20 mg/dL   Creatinine, Ser 1.60 (H) 0.61 - 1.24 mg/dL   Glucose, Bld 113 (H) 65 - 99 mg/dL   Calcium, Ion 1.22 1.15 - 1.40 mmol/L   TCO2 26 0 - 100 mmol/L   Hemoglobin 14.3 13.0 - 17.0 g/dL   HCT 42.0 39.0 - 52.0 %   Ct Angio Head W Or Wo Contrast  Result Date: 01/03/2017 CLINICAL DATA:  Headache and vision loss. EXAM: CT ANGIOGRAPHY HEAD AND NECK TECHNIQUE: Multidetector CT imaging of the head and neck was performed using the standard protocol during bolus administration of intravenous contrast. Multiplanar CT image reconstructions and MIPs were obtained to evaluate the vascular anatomy. Carotid stenosis measurements (when applicable)  are obtained utilizing NASCET criteria, using the distal internal carotid diameter as the denominator. CONTRAST:  50 cc Isovue 370 COMPARISON:  Head CT 01/03/2017 FINDINGS: CTA NECK FINDINGS Aortic arch: There is no aneurysm or dissection of the visualized ascending aorta or aortic arch. Normal 3 vessel aortic branching pattern. The visualized proximal subclavian arteries are normal. Minimal aortic atherosclerosis. Right carotid system: The right common carotid origin is widely patent. There is no common carotid or internal carotid artery dissection or aneurysm. Atherosclerotic calcification at the carotid bifurcation without hemodynamically significant stenosis. Left carotid system: The left common carotid origin is widely patent. There is no common carotid or internal carotid artery dissection or aneurysm. No hemodynamically significant stenosis. Vertebral arteries: The vertebral system is codominant. Both vertebral artery origins are normal. Both vertebral arteries are normal to their confluence with the basilar artery. Skeleton: There is no bony spinal canal stenosis. No lytic or blastic lesions. Other neck: There is asymmetry of the palatine tonsils with slightly enlarged left tonsil without discrete  lesions. Upper chest: Biapical emphysema. Review of the MIP images confirms the above findings CTA HEAD FINDINGS Anterior circulation: --Intracranial internal carotid arteries: Normal. --Anterior cerebral arteries: Normal. --Middle cerebral arteries: Normal. --Posterior communicating arteries: Absent bilaterally. Posterior circulation: --Posterior cerebral arteries: Normal. --Superior cerebellar arteries: Normal. --Basilar artery: Normal. --Anterior inferior cerebellar arteries: Normal. --Posterior inferior cerebellar arteries: Normal. Venous sinuses: As permitted by contrast timing, patent. Anatomic variants: None Delayed phase: No parenchymal contrast enhancement. Review of the MIP images confirms the above findings IMPRESSION: 1. No emergent large vessel occlusion or intracranial stenosis. 2. No intracranial aneurysm. 3. No dissection or hemodynamically significant stenosis of the carotid or vertebral arteries. Electronically Signed   By: Ulyses Jarred M.D.   On: 01/03/2017 16:45   Ct Head Wo Contrast  Result Date: 01/03/2017 CLINICAL DATA:  Recent visual loss EXAM: CT HEAD WITHOUT CONTRAST TECHNIQUE: Contiguous axial images were obtained from the base of the skull through the vertex without intravenous contrast. COMPARISON:  12/07/2016 FINDINGS: Brain: There again noted changes consistent with prior left MCA infarct stable from the prior exam. No findings to suggest acute hemorrhage, acute infarction or space-occupying mass lesion are noted. Vascular: No hyperdense vessel or unexpected calcification. Skull: Normal. Negative for fracture or focal lesion. Sinuses/Orbits: No acute finding. Other: None. IMPRESSION: Chronic changes without acute abnormality. Electronically Signed   By: Inez Catalina M.D.   On: 01/03/2017 13:23   Ct Head Wo Contrast  Result Date: 12/07/2016 CLINICAL DATA:  Recent fall with headaches and dizziness EXAM: CT HEAD WITHOUT CONTRAST TECHNIQUE: Contiguous axial images were obtained  from the base of the skull through the vertex without intravenous contrast. COMPARISON:  07/05/2016 FINDINGS: Brain: Area of decreased attenuation is noted in the distribution of the left middle cerebral artery consistent with prior infarct. No acute infarct, hemorrhage or space-occupying mass lesion is seen. Vascular: No hyperdense vessel or unexpected calcification. Skull: Normal. Negative for fracture or focal lesion. Sinuses/Orbits: No acute finding. Other: None. 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 No acute abnormality noted. Electronically Signed   By: Inez Catalina M.D.   On: 12/07/2016 14:34   Ct Angio Neck W And/or Wo Contrast  Result Date: 01/03/2017 CLINICAL DATA:  Headache and vision loss. EXAM: CT ANGIOGRAPHY HEAD AND NECK TECHNIQUE: Multidetector CT imaging of the head and neck was performed using the standard protocol during bolus administration of intravenous contrast. Multiplanar CT image reconstructions and MIPs were obtained to evaluate  the vascular anatomy. Carotid stenosis measurements (when applicable) are obtained utilizing NASCET criteria, using the distal internal carotid diameter as the denominator. CONTRAST:  50 cc Isovue 370 COMPARISON:  Head CT 01/03/2017 FINDINGS: CTA NECK FINDINGS Aortic arch: There is no aneurysm or dissection of the visualized ascending aorta or aortic arch. Normal 3 vessel aortic branching pattern. The visualized proximal subclavian arteries are normal. Minimal aortic atherosclerosis. Right carotid system: The right common carotid origin is widely patent. There is no common carotid or internal carotid artery dissection or aneurysm. Atherosclerotic calcification at the carotid bifurcation without hemodynamically significant stenosis. Left carotid system: The left common carotid origin is widely patent. There is no common carotid or internal carotid artery dissection or aneurysm. No hemodynamically significant stenosis. Vertebral arteries:  The vertebral system is codominant. Both vertebral artery origins are normal. Both vertebral arteries are normal to their confluence with the basilar artery. Skeleton: There is no bony spinal canal stenosis. No lytic or blastic lesions. Other neck: There is asymmetry of the palatine tonsils with slightly enlarged left tonsil without discrete lesions. Upper chest: Biapical emphysema. Review of the MIP images confirms the above findings CTA HEAD FINDINGS Anterior circulation: --Intracranial internal carotid arteries: Normal. --Anterior cerebral arteries: Normal. --Middle cerebral arteries: Normal. --Posterior communicating arteries: Absent bilaterally. Posterior circulation: --Posterior cerebral arteries: Normal. --Superior cerebellar arteries: Normal. --Basilar artery: Normal. --Anterior inferior cerebellar arteries: Normal. --Posterior inferior cerebellar arteries: Normal. Venous sinuses: As permitted by contrast timing, patent. Anatomic variants: None Delayed phase: No parenchymal contrast enhancement. Review of the MIP images confirms the above findings IMPRESSION: 1. No emergent large vessel occlusion or intracranial stenosis. 2. No intracranial aneurysm. 3. No dissection or hemodynamically significant stenosis of the carotid or vertebral arteries. Electronically Signed   By: Ulyses Jarred M.D.   On: 01/03/2017 16:45   Dg Hip Unilat With Pelvis 2-3 Views Right  Result Date: 12/08/2016 CLINICAL DATA:  Fall. EXAM: DG HIP (WITH OR WITHOUT PELVIS) 2-3V RIGHT COMPARISON:  05/03/2016 . FINDINGS: Mild degenerative changes lumbar spine and both hips. Tiny corticated bony density noted adjacent to the right acetabulum is stable in appearance and most likely related to degenerative change . No acute bony abnormality identified. No evidence of acute fracture or dislocation. Punctate calcifications in pelvis consistent with phleboliths. Exam is stable from prior exam. 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 No acute abnormality identified. Exam is stable from prior exam . Electronically Signed   By: Marcello Moores  Register   On: 12/08/2016 11:42   Dg Femur, Min 2 Views Right  Result Date: 12/08/2016 CLINICAL DATA:  Fall in bathtub.  Pain. EXAM: RIGHT FEMUR 2 VIEWS COMPARISON:  05/03/2016. FINDINGS: Mild degenerative changes right hip and knee. No evidence of fracture or dislocation. No acute or focal abnormality identified. 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 No acute or focal abnormality identified. No evidence of fracture. Electronically Signed   By: Marcello Moores  Register   On: 12/08/2016 11:46  Patient with mild renal insufficiency, which is chronic   Orlie Dakin, MD 01/03/17 2006

## 2017-01-03 NOTE — Discharge Instructions (Signed)
Follow-up with [name] as needed. Or return if concern for any reason

## 2017-01-03 NOTE — ED Triage Notes (Signed)
Pt endorses having an episode Sunday where his vision blacked out and he could not see. Pt got vision back Monday but has had blurred vision since. Pt had an ablation last Thursday for A-fib. Pt went to eye dr today and was sent here for stroke rule out. Pt has hx of CVA without deficits. Pt has no neuro deficits in triage. VSS.
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
## 2017-01-05 ENCOUNTER — Encounter (HOSPITAL_COMMUNITY): Payer: Self-pay | Admitting: Emergency Medicine

## 2017-01-05 ENCOUNTER — Emergency Department (HOSPITAL_COMMUNITY): Payer: Medicare Other

## 2017-01-05 ENCOUNTER — Emergency Department (HOSPITAL_COMMUNITY)
Admission: EM | Admit: 2017-01-05 | Discharge: 2017-01-05 | Disposition: A | Discharge status: Home / Self Care | Payer: Medicare Other | Attending: Emergency Medicine | Admitting: Emergency Medicine

## 2017-01-05 ENCOUNTER — Ambulatory Visit: Payer: Medicare Other | Admitting: Adult Health

## 2017-01-05 DIAGNOSIS — I130 Hypertensive heart and chronic kidney disease with heart failure and stage 1 through stage 4 chronic kidney disease, or unspecified chronic kidney disease: Secondary | ICD-10-CM | POA: Insufficient documentation

## 2017-01-05 DIAGNOSIS — N183 Chronic kidney disease, stage 3 (moderate): Secondary | ICD-10-CM | POA: Diagnosis not present

## 2017-01-05 DIAGNOSIS — Z87891 Personal history of nicotine dependence: Secondary | ICD-10-CM | POA: Insufficient documentation

## 2017-01-05 DIAGNOSIS — Z8673 Personal history of transient ischemic attack (TIA), and cerebral infarction without residual deficits: Secondary | ICD-10-CM | POA: Diagnosis not present

## 2017-01-05 DIAGNOSIS — K228 Other specified diseases of esophagus: Secondary | ICD-10-CM | POA: Insufficient documentation

## 2017-01-05 DIAGNOSIS — I5022 Chronic systolic (congestive) heart failure: Secondary | ICD-10-CM | POA: Insufficient documentation

## 2017-01-05 DIAGNOSIS — Z79899 Other long term (current) drug therapy: Secondary | ICD-10-CM | POA: Insufficient documentation

## 2017-01-05 DIAGNOSIS — Z7901 Long term (current) use of anticoagulants: Secondary | ICD-10-CM | POA: Insufficient documentation

## 2017-01-05 DIAGNOSIS — R079 Chest pain, unspecified: Secondary | ICD-10-CM | POA: Diagnosis present

## 2017-01-05 DIAGNOSIS — I252 Old myocardial infarction: Secondary | ICD-10-CM | POA: Insufficient documentation

## 2017-01-05 DIAGNOSIS — R0789 Other chest pain: Secondary | ICD-10-CM | POA: Insufficient documentation

## 2017-01-05 DIAGNOSIS — K2289 Other specified disease of esophagus: Secondary | ICD-10-CM

## 2017-01-05 LAB — BASIC METABOLIC PANEL
ANION GAP: 8 (ref 5–15)
BUN: 19 mg/dL (ref 6–20)
CHLORIDE: 106 mmol/L (ref 101–111)
CO2: 25 mmol/L (ref 22–32)
Calcium: 9.1 mg/dL (ref 8.9–10.3)
Creatinine, Ser: 1.82 mg/dL — ABNORMAL HIGH (ref 0.61–1.24)
GFR calc non Af Amer: 36 mL/min — ABNORMAL LOW (ref >60)
GFR, EST AFRICAN AMERICAN: 42 mL/min — AB (ref >60)
Glucose, Bld: 188 mg/dL — ABNORMAL HIGH (ref 65–99)
Potassium: 4.1 mmol/L (ref 3.5–5.1)
Sodium: 139 mmol/L (ref 135–145)

## 2017-01-05 LAB — CBC
HCT: 37.5 % — ABNORMAL LOW (ref 39.0–52.0)
HEMOGLOBIN: 12.6 g/dL — AB (ref 13.0–17.0)
MCH: 30.1 pg (ref 26.0–34.0)
MCHC: 33.6 g/dL (ref 30.0–36.0)
MCV: 89.7 fL (ref 78.0–100.0)
Platelets: 226 10*3/uL (ref 150–400)
RBC: 4.18 MIL/uL — AB (ref 4.22–5.81)
RDW: 13.2 % (ref 11.5–15.5)
WBC: 5.5 10*3/uL (ref 4.0–10.5)

## 2017-01-05 LAB — I-STAT TROPONIN, ED
TROPONIN I, POC: 0.2 ng/mL — AB (ref 0.00–0.08)
Troponin i, poc: 0.22 ng/mL (ref 0.00–0.08)

## 2017-01-05 MED ORDER — NITROGLYCERIN 0.4 MG SL SUBL
SUBLINGUAL_TABLET | SUBLINGUAL | Status: AC
  Filled 2017-01-05: qty 1

## 2017-01-05 MED ORDER — NITROGLYCERIN 0.6 MG SL SUBL
0.6000 mg | SUBLINGUAL_TABLET | Freq: Once | SUBLINGUAL | Status: DC
  Filled 2017-01-05: qty 100

## 2017-01-05 NOTE — ED Triage Notes (Signed)
Pt BIB EMS from home for CP. Pt received 2 nitro and 324 ASA PTA. Pt rates pain 4/10 at this time; denies SOB. A&Ox4; resp e/u. NAD.  Pt had ablasion done last week and reports hx of MI, pacemaker, and stroke.

## 2017-01-05 NOTE — ED Provider Notes (Signed)
I have personally seen and examined the patient. I have reviewed the documentation on PMH/FH/Soc Hx. I have discussed the plan of care with the resident and patient.  I have reviewed and agree with the resident's documentation. Please see associated encounter note.   EKG Interpretation  Date/Time:  Friday January 05 2017 11:36:35 EDT Ventricular Rate:  55 PR Interval:    QRS Duration: 140 QT Interval:  451 QTC Calculation: 432 R Axis:   20 Text Interpretation:  Sinus rhythm Left bundle branch block No significant change since last tracing Confirmed by Addison Lank [phone]) on 01/05/2017 12:14:09 PM         Madia Carvell, Grayce Sessions, MD 01/05/17 1530
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
## 2017-01-05 NOTE — ED Notes (Signed)
Patient transported to X-ray 

## 2017-01-05 NOTE — Discharge Instructions (Addendum)
Please follow-up with your primary care doctor and your cardiologist sometime next week concerning your visit today.   Please take your medications individually in the morning to avoid reoccurrence of the esophageal pain.   If your symptoms worsen or if you develop chest pain again, please return to the ED.  If you develop nausea, shortness of breath, vomiting, visual changes, difficulty breathing, chest pain radiating or nonradiating, sweating, or any similar concerning symptoms please call your doctor or return to the ED.  Thank you for your visit today.

## 2017-01-08 ENCOUNTER — Encounter (INDEPENDENT_AMBULATORY_CARE_PROVIDER_SITE_OTHER): Payer: Self-pay

## 2017-01-08 ENCOUNTER — Ambulatory Visit (INDEPENDENT_AMBULATORY_CARE_PROVIDER_SITE_OTHER): Payer: Medicare Other | Admitting: Internal Medicine

## 2017-01-08 ENCOUNTER — Encounter: Payer: Self-pay | Admitting: Internal Medicine

## 2017-01-08 VITALS — BP 102/60 | HR 61 | Ht 69.0 in | Wt 194.5 lb

## 2017-01-08 DIAGNOSIS — I472 Ventricular tachycardia, unspecified: Secondary | ICD-10-CM

## 2017-01-08 DIAGNOSIS — I5022 Chronic systolic (congestive) heart failure: Secondary | ICD-10-CM

## 2017-01-08 DIAGNOSIS — I255 Ischemic cardiomyopathy: Secondary | ICD-10-CM | POA: Diagnosis not present

## 2017-01-08 LAB — CUP PACEART INCLINIC DEVICE CHECK
Date Time Interrogation Session: 20180820040000
HighPow Impedance: 41 Ohm
HighPow Impedance: 49 Ohm
Implantable Lead Implant Date: 20050922
Implantable Lead Implant Date: 20050922
Implantable Lead Location: 753859
Implantable Lead Model: 158
Implantable Lead Serial Number: 156891
Lead Channel Pacing Threshold Amplitude: 0.5 V
Lead Channel Pacing Threshold Amplitude: 0.8 V
Lead Channel Pacing Threshold Pulse Width: 0.4 ms
Lead Channel Sensing Intrinsic Amplitude: 24.5 mV
Lead Channel Setting Pacing Amplitude: 2.4 V
Lead Channel Setting Sensing Sensitivity: 0.6 mV
MDC IDC LEAD LOCATION: 753860
MDC IDC MSMT LEADCHNL RA IMPEDANCE VALUE: 502 Ohm
MDC IDC MSMT LEADCHNL RA SENSING INTR AMPL: 3.5 mV
MDC IDC MSMT LEADCHNL RV IMPEDANCE VALUE: 454 Ohm
MDC IDC MSMT LEADCHNL RV PACING THRESHOLD PULSEWIDTH: 0.4 ms
MDC IDC PG IMPLANT DT: 20100817
MDC IDC SET LEADCHNL RA PACING AMPLITUDE: 2 V
MDC IDC SET LEADCHNL RV PACING PULSEWIDTH: 0.4 ms
Pulse Gen Serial Number: 141895
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
## 2017-01-08 NOTE — Patient Instructions (Signed)
Medication Instructions:  Your physician recommends that you continue on your current medications as directed. Please refer to the Current Medication list given to you today.   Labwork: None ordered   Testing/Procedures: None ordered   Follow-Up: Your physician recommends that you schedule a follow-up appointment as scheduled with Dr Rayann Heman   Any Other Special Instructions Will Be Listed Below (If Applicable).     If you need a refill on your cardiac medications before your next appointment, please call your pharmacy.

## 2017-01-10 ENCOUNTER — Ambulatory Visit (INDEPENDENT_AMBULATORY_CARE_PROVIDER_SITE_OTHER): Payer: Medicare Other | Admitting: *Deleted

## 2017-01-10 DIAGNOSIS — I255 Ischemic cardiomyopathy: Secondary | ICD-10-CM | POA: Diagnosis not present

## 2017-01-10 NOTE — Progress Notes (Signed)
Remote ICD transmission.   

## 2017-01-12 ENCOUNTER — Ambulatory Visit: Payer: Medicare Other | Admitting: Adult Health

## 2017-01-15 ENCOUNTER — Other Ambulatory Visit (HOSPITAL_COMMUNITY): Payer: Self-pay | Admitting: Internal Medicine

## 2017-01-15 ENCOUNTER — Other Ambulatory Visit: Payer: Self-pay | Admitting: Adult Health

## 2017-01-16 NOTE — Telephone Encounter (Signed)
Sent to the pharmacy by e-scribe for 30 days.  Due for yearly on or after  02/08/17

## 2017-01-17 ENCOUNTER — Encounter: Payer: Self-pay | Admitting: Adult Health

## 2017-01-17 ENCOUNTER — Telehealth: Payer: Self-pay | Admitting: Neurology

## 2017-01-17 ENCOUNTER — Ambulatory Visit (INDEPENDENT_AMBULATORY_CARE_PROVIDER_SITE_OTHER): Payer: Medicare Other | Admitting: Adult Health

## 2017-01-17 VITALS — BP 110/62 | Temp 98.0°F | Ht 69.0 in | Wt 194.0 lb

## 2017-01-17 DIAGNOSIS — R2681 Unsteadiness on feet: Secondary | ICD-10-CM | POA: Diagnosis not present

## 2017-01-17 DIAGNOSIS — R1310 Dysphagia, unspecified: Secondary | ICD-10-CM

## 2017-01-17 DIAGNOSIS — S80862A Insect bite (nonvenomous), left lower leg, initial encounter: Secondary | ICD-10-CM | POA: Diagnosis not present

## 2017-01-17 DIAGNOSIS — W57XXXA Bitten or stung by nonvenomous insect and other nonvenomous arthropods, initial encounter: Secondary | ICD-10-CM

## 2017-01-17 DIAGNOSIS — G40209 Localization-related (focal) (partial) symptomatic epilepsy and epileptic syndromes with complex partial seizures, not intractable, without status epilepticus: Secondary | ICD-10-CM

## 2017-01-17 DIAGNOSIS — S80861A Insect bite (nonvenomous), right lower leg, initial encounter: Secondary | ICD-10-CM

## 2017-01-17 MED ORDER — LACOSAMIDE 50 MG PO TABS: 50.0000 mg | ORAL_TABLET | Freq: Two times a day (BID) | ORAL | 5 refills | Status: DC

## 2017-01-17 MED ORDER — LEVOCETIRIZINE DIHYDROCHLORIDE 5 MG PO TABS: 5.0000 mg | ORAL_TABLET | Freq: Every evening | ORAL | 3 refills | Status: DC

## 2017-01-17 NOTE — Telephone Encounter (Signed)
Could not fax without provider's signature.  Rx faxed.  Confirmation from pharmacy received.
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
## 2017-01-17 NOTE — Telephone Encounter (Signed)
Patient's wife called regarding [name]'s medication Vimpat 50 MG. He only has enough for today. He uses [facility] [phone]. He is scheduled to see Dr. Delice Lesch for a Follow Up on 01/19/17. She said he has been having issues with his eyesight going black over both eyes. They are concerned it could be his heart or stroke? Please Advise. Thanks

## 2017-01-17 NOTE — Telephone Encounter (Signed)
[facility] called again in regards to pt's medication vimpat CB# [phone]

## 2017-01-17 NOTE — Telephone Encounter (Signed)
Rx printed, waiting for provider signature.  Will fax.

## 2017-01-19 ENCOUNTER — Encounter: Payer: Self-pay | Admitting: Neurology

## 2017-01-19 ENCOUNTER — Encounter: Payer: Self-pay | Admitting: Cardiology

## 2017-01-19 ENCOUNTER — Ambulatory Visit (INDEPENDENT_AMBULATORY_CARE_PROVIDER_SITE_OTHER): Payer: Medicare Other | Admitting: Neurology

## 2017-01-19 VITALS — BP 124/62 | HR 63 | Ht 69.0 in | Wt 193.0 lb

## 2017-01-19 DIAGNOSIS — G450 Vertebro-basilar artery syndrome: Secondary | ICD-10-CM

## 2017-01-19 DIAGNOSIS — R2681 Unsteadiness on feet: Secondary | ICD-10-CM | POA: Diagnosis not present

## 2017-01-19 DIAGNOSIS — G40209 Localization-related (focal) (partial) symptomatic epilepsy and epileptic syndromes with complex partial seizures, not intractable, without status epilepticus: Secondary | ICD-10-CM

## 2017-01-19 MED ORDER — LACOSAMIDE 50 MG PO TABS: ORAL_TABLET | ORAL | 5 refills | Status: DC
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
## 2017-01-19 NOTE — Patient Instructions (Signed)
1. Increase Vimpat 50mg : Take 1.5 tablets twice a day 2. Continue all your other medications 3. Recommend PT for balance and back pain 4. Keep a calendar of the seizures 5. Follow-up in 4-5 months, call for any changes  Seizure Precautions: 1. If medication has been prescribed for you to prevent seizures, take it exactly as directed.  Do not stop taking the medicine without talking to your doctor first, even if you have not had a seizure in a long time.   2. Avoid activities in which a seizure would cause danger to yourself or to others.  Don't operate dangerous machinery, swim alone, or climb in high or dangerous places, such as on ladders, roofs, or girders.  Do not drive unless your doctor says you may.  3. If you have any warning that you may have a seizure, lay down in a safe place where you can't hurt yourself.    4.  No driving for 6 months from last seizure, as per [facility].   Please refer to the following link on the [place] website for more information: http://[email or web address]/[email or web address]/drivingu.cfm   5.  Maintain good sleep hygiene. Avoid alcohol.  6.  Contact your doctor if you have any problems that may be related to the medicine you are taking.  7.  Call 911 and bring the patient back to the ED if:        A.  The seizure lasts longer than 5 minutes.       B.  The patient doesn't awaken shortly after the seizure  C.  The patient has new problems such as difficulty seeing, speaking or moving  D.  The patient was injured during the seizure  E.  The patient has a temperature over 102 F (39C)  F.  The patient vomited and now is having trouble breathing

## 2017-01-26 ENCOUNTER — Encounter: Payer: Self-pay | Admitting: Neurology

## 2017-01-29 ENCOUNTER — Ambulatory Visit (INDEPENDENT_AMBULATORY_CARE_PROVIDER_SITE_OTHER): Payer: Medicare Other | Admitting: Internal Medicine

## 2017-01-29 ENCOUNTER — Encounter: Payer: Self-pay | Admitting: Internal Medicine

## 2017-01-29 VITALS — BP 120/68 | HR 55 | Ht 69.0 in | Wt 199.0 lb

## 2017-01-29 DIAGNOSIS — I5022 Chronic systolic (congestive) heart failure: Secondary | ICD-10-CM

## 2017-01-29 DIAGNOSIS — I472 Ventricular tachycardia, unspecified: Secondary | ICD-10-CM

## 2017-01-29 DIAGNOSIS — N183 Chronic kidney disease, stage 3 unspecified: Secondary | ICD-10-CM

## 2017-01-29 DIAGNOSIS — I255 Ischemic cardiomyopathy: Secondary | ICD-10-CM

## 2017-01-29 LAB — CUP PACEART INCLINIC DEVICE CHECK
Date Time Interrogation Session: 20180910040000
HIGH POWER IMPEDANCE MEASURED VALUE: 41 Ohm
HighPow Impedance: 48 Ohm
Implantable Lead Implant Date: 20050922
Implantable Lead Implant Date: 20050922
Implantable Lead Location: 753860
Implantable Lead Model: 158
Implantable Lead Model: 5076
Implantable Pulse Generator Implant Date: 20100817
Lead Channel Impedance Value: 491 Ohm
Lead Channel Pacing Threshold Amplitude: 0.5 V
Lead Channel Pacing Threshold Pulse Width: 0.4 ms
Lead Channel Sensing Intrinsic Amplitude: 3.3 mV
Lead Channel Setting Pacing Amplitude: 2.4 V
Lead Channel Setting Pacing Pulse Width: 0.4 ms
Lead Channel Setting Sensing Sensitivity: 0.6 mV
MDC IDC LEAD LOCATION: 753859
MDC IDC LEAD SERIAL: 156891
MDC IDC MSMT LEADCHNL RA PACING THRESHOLD PULSEWIDTH: 0.4 ms
MDC IDC MSMT LEADCHNL RV IMPEDANCE VALUE: 442 Ohm
MDC IDC MSMT LEADCHNL RV PACING THRESHOLD AMPLITUDE: 1 V
MDC IDC MSMT LEADCHNL RV SENSING INTR AMPL: 20.8 mV
MDC IDC PG SERIAL: 141895
MDC IDC SET LEADCHNL RA PACING AMPLITUDE: 2 V

## 2017-01-29 NOTE — Patient Instructions (Signed)
Medication Instructions:  Your physician recommends that you continue on your current medications as directed. Please refer to the Current Medication list given to you today.   Labwork: None ordered   Testing/Procedures: None ordered   Follow-Up: Your physician wants you to follow-up in: 3 months with Chanetta Marshall, NP and 6 months with Dr. Rayann Heman. You will receive a reminder letter in the mail two months in advance. If you don't receive a letter, please call our office to schedule the follow-up appointment.  Remote monitoring is used to monitor your ICD from home. This monitoring reduces the number of office visits required to check your device to one time per year. It allows [place] to keep an eye on the functioning of your device to ensure it is working properly. You are scheduled for a device check from home on 04/11/17. You may send your transmission at any time that day. If you have a wireless device, the transmission will be sent automatically. After your physician reviews your transmission, you will receive a postcard with your next transmission date.   Any Other Special Instructions Will Be Listed Below (If Applicable).     If you need a refill on your cardiac medications before your next appointment, please call your pharmacy.
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
## 2017-02-12 ENCOUNTER — Encounter (HOSPITAL_COMMUNITY): Payer: Self-pay | Admitting: *Deleted

## 2017-02-12 ENCOUNTER — Emergency Department (HOSPITAL_COMMUNITY)
Admission: EM | Admit: 2017-02-12 | Discharge: 2017-02-12 | Disposition: A | Discharge status: Home / Self Care | Payer: Medicare Other | Attending: Emergency Medicine | Admitting: Emergency Medicine

## 2017-02-12 ENCOUNTER — Emergency Department (HOSPITAL_COMMUNITY): Payer: Medicare Other

## 2017-02-12 ENCOUNTER — Telehealth: Payer: Self-pay | Admitting: Internal Medicine

## 2017-02-12 ENCOUNTER — Telehealth: Payer: Self-pay | Admitting: Cardiology

## 2017-02-12 DIAGNOSIS — Z6830 Body mass index (BMI) 30.0-30.9, adult: Secondary | ICD-10-CM | POA: Diagnosis not present

## 2017-02-12 DIAGNOSIS — Z95810 Presence of automatic (implantable) cardiac defibrillator: Secondary | ICD-10-CM | POA: Diagnosis not present

## 2017-02-12 DIAGNOSIS — Z7901 Long term (current) use of anticoagulants: Secondary | ICD-10-CM | POA: Diagnosis not present

## 2017-02-12 DIAGNOSIS — I10 Essential (primary) hypertension: Secondary | ICD-10-CM | POA: Diagnosis not present

## 2017-02-12 DIAGNOSIS — T82118A Breakdown (mechanical) of other cardiac electronic device, initial encounter: Secondary | ICD-10-CM | POA: Diagnosis present

## 2017-02-12 DIAGNOSIS — I472 Ventricular tachycardia, unspecified: Secondary | ICD-10-CM

## 2017-02-12 DIAGNOSIS — I4891 Unspecified atrial fibrillation: Secondary | ICD-10-CM | POA: Insufficient documentation

## 2017-02-12 DIAGNOSIS — I5022 Chronic systolic (congestive) heart failure: Secondary | ICD-10-CM | POA: Diagnosis not present

## 2017-02-12 DIAGNOSIS — I252 Old myocardial infarction: Secondary | ICD-10-CM | POA: Diagnosis not present

## 2017-02-12 DIAGNOSIS — N183 Chronic kidney disease, stage 3 (moderate): Secondary | ICD-10-CM | POA: Insufficient documentation

## 2017-02-12 DIAGNOSIS — Z87891 Personal history of nicotine dependence: Secondary | ICD-10-CM | POA: Insufficient documentation

## 2017-02-12 DIAGNOSIS — Y69 Unspecified misadventure during surgical and medical care: Secondary | ICD-10-CM | POA: Diagnosis not present

## 2017-02-12 DIAGNOSIS — Z8673 Personal history of transient ischemic attack (TIA), and cerebral infarction without residual deficits: Secondary | ICD-10-CM | POA: Diagnosis not present

## 2017-02-12 DIAGNOSIS — Z4502 Encounter for adjustment and management of automatic implantable cardiac defibrillator: Secondary | ICD-10-CM | POA: Diagnosis not present

## 2017-02-12 DIAGNOSIS — I130 Hypertensive heart and chronic kidney disease with heart failure and stage 1 through stage 4 chronic kidney disease, or unspecified chronic kidney disease: Secondary | ICD-10-CM | POA: Diagnosis not present

## 2017-02-12 DIAGNOSIS — Z79899 Other long term (current) drug therapy: Secondary | ICD-10-CM | POA: Insufficient documentation

## 2017-02-12 LAB — BASIC METABOLIC PANEL
Anion gap: 7 (ref 5–15)
BUN: 13 mg/dL (ref 6–20)
BUN: 15 (ref 4–21)
CALCIUM: 9.4 mg/dL (ref 8.9–10.3)
CO2: 26 mmol/L (ref 22–32)
CREATININE: 1.63 mg/dL — AB (ref 0.61–1.24)
Chloride: 105 mmol/L (ref 101–111)
Creatinine: 1.5 — AB (ref 0.6–1.3)
GFR calc Af Amer: 48 mL/min — ABNORMAL LOW (ref >60)
GFR, EST NON AFRICAN AMERICAN: 41 mL/min — AB (ref >60)
GLUCOSE: 172 mg/dL — AB (ref 65–99)
GLUCOSE: 190
POTASSIUM: 3.7 mmol/L (ref 3.5–5.1)
Potassium: 4.3 (ref 3.4–5.3)
SODIUM: 138 mmol/L (ref 135–145)
SODIUM: 138 (ref 137–147)

## 2017-02-12 LAB — CBC WITH DIFFERENTIAL/PLATELET
Basophils Absolute: 0 10*3/uL (ref 0.0–0.1)
Basophils Relative: 1 %
EOS ABS: 0.1 10*3/uL (ref 0.0–0.7)
EOS PCT: 2 %
HCT: 39.2 % (ref 39.0–52.0)
Hemoglobin: 13.2 g/dL (ref 13.0–17.0)
LYMPHS ABS: 2.4 10*3/uL (ref 0.7–4.0)
LYMPHS PCT: 37 %
MCH: 30.3 pg (ref 26.0–34.0)
MCHC: 33.7 g/dL (ref 30.0–36.0)
MCV: 89.9 fL (ref 78.0–100.0)
MONO ABS: 0.5 10*3/uL (ref 0.1–1.0)
Monocytes Relative: 8 %
Neutro Abs: 3.4 10*3/uL (ref 1.7–7.7)
Neutrophils Relative %: 52 %
PLATELETS: 234 10*3/uL (ref 150–400)
RBC: 4.36 MIL/uL (ref 4.22–5.81)
RDW: 13.8 % (ref 11.5–15.5)
WBC: 6.5 10*3/uL (ref 4.0–10.5)

## 2017-02-12 LAB — TROPONIN I: Troponin I: 0.06 ng/mL (ref <0.03)

## 2017-02-12 LAB — MAGNESIUM: MAGNESIUM: 1.8 mg/dL (ref 1.7–2.4)

## 2017-02-12 LAB — CBG MONITORING, ED: Glucose-Capillary: 128 mg/dL — ABNORMAL HIGH (ref 65–99)

## 2017-02-12 MED ORDER — MAGNESIUM SULFATE 2 GM/50ML IV SOLN
2.0000 g | Freq: Once | INTRAVENOUS | Status: AC
  Administered 2017-02-12: 2 g via INTRAVENOUS
  Filled 2017-02-12: qty 50

## 2017-02-12 MED ORDER — RANOLAZINE ER 500 MG PO TB12
500.0000 mg | ORAL_TABLET | Freq: Two times a day (BID) | ORAL | Status: DC
  Administered 2017-02-12: 500 mg via ORAL
  Filled 2017-02-12: qty 1

## 2017-02-12 MED ORDER — RANOLAZINE ER 500 MG PO TB12: 500.0000 mg | ORAL_TABLET | Freq: Two times a day (BID) | ORAL | 0 refills | Status: DC

## 2017-02-12 NOTE — Telephone Encounter (Signed)
Noted  

## 2017-02-12 NOTE — Telephone Encounter (Signed)
Dr Caryl Comes spoke with Dr Rayann Heman while patient was in the ER and is aware of changes.  Keep follow up appointment as scheduled.
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
## 2017-02-12 NOTE — ED Notes (Signed)
Pacemaker interrogated  [employer] spoke with Rep will fax report. Patient is comfortable at present. famliy at bedside.

## 2017-02-12 NOTE — Telephone Encounter (Signed)
New message      1. Has your device fired?  Yes and has passed out ambulance on the way   2. Is you device beeping? no  3. Are you experiencing draining or swelling at device site?  No   4. Are you calling to see if we received your device transmission?  no  5. Have you passed out?  yes

## 2017-02-12 NOTE — Telephone Encounter (Signed)
F/u Message  Pt wife call requesting to speak with RN. Pt wife states since pt has been shocked by his device, she would like to know if pts hospital chart can be reviewed by Dr. Rayann Heman and if possible to get a sooner appt with Dr. Rayann Heman sooner than his next appt on 10/9. Please call back to discuss

## 2017-02-12 NOTE — ED Notes (Signed)
Signature pad broken pt stable and understands discharge instructions
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
## 2017-02-12 NOTE — ED Triage Notes (Signed)
Patient states he was out shopping was at the cashier lane had a funny feeling in his stomach to he lowered hisself to the floor and his defib fired x 1 , denies loc. States he remembers everything. , states this happened to him 2 weeks ago and defib x 2. Was seen in ED and states some of the settings were changed. With his pacemaker. He has a Chemical engineer

## 2017-02-12 NOTE — Telephone Encounter (Signed)
New message       Calling to let the nurse know pt is at [facility] because device was shocking pt multiple times

## 2017-02-13 ENCOUNTER — Telehealth: Payer: Self-pay

## 2017-02-13 NOTE — Telephone Encounter (Addendum)
Spoke with patients wife. She is quite distraught and reports that patient was prescribed Ranexa in the ED 9/24 post VT with shock. She states that he left the hospital feeling well but has since begun to "feel bad" with symptoms of extreme lethargy and fatigue. She reports that he has been on ranexa in the past with similar side effects and she is requesting a medication change. Latitude check completed overnight which shows presenting rhythm as NSR with 2 NSVT episodes, most recent 9/24 at 2156 x 4 beats. She states that he has only taken one dose of Ranexa at this point.

## 2017-02-13 NOTE — Telephone Encounter (Signed)
Patient wife is calling in regards to episode from Monday 02-12-2017. Pt wife wants a call back.

## 2017-02-13 NOTE — Telephone Encounter (Signed)
Agree with continued ranexa for now.

## 2017-02-13 NOTE — Telephone Encounter (Signed)
Spoke with patient's wife and let her know that I have reached out to Dr. Rayann Heman for recommendations. I explained that his transmission overnight showed that his HR was in NSR and I encouraged her to keep him on the renexa as scheduled. She verbalized understanding.

## 2017-02-14 ENCOUNTER — Other Ambulatory Visit: Payer: Self-pay | Admitting: Adult Health

## 2017-02-14 DIAGNOSIS — R2681 Unsteadiness on feet: Secondary | ICD-10-CM

## 2017-02-14 NOTE — Telephone Encounter (Signed)
Patients wife aware

## 2017-02-14 NOTE — Telephone Encounter (Signed)
Made [name] aware of Dr. Jackalyn Lombard recommendations. She verbalizes understanding. She said that yesterday he felt dizzy while walking outside, BP low, rested for some time and his BP recovered. He is feeling better today.

## 2017-02-15 ENCOUNTER — Telehealth: Payer: Self-pay | Admitting: Adult Health

## 2017-02-15 NOTE — Telephone Encounter (Signed)
Referral has already been placed. They should hear something in the next day or two

## 2017-02-15 NOTE — Telephone Encounter (Signed)
Megan with [facility]" states they received the order for PT, but the patient states they also need OT because he cannot open his hand.  They need an order faxed to: [phone]

## 2017-02-15 NOTE — Telephone Encounter (Signed)
Pt need a referral to PT and OPT wife state that the neurologist suggest that they need to move forward with this now.

## 2017-02-15 NOTE — Telephone Encounter (Signed)
Left a message for a return call.

## 2017-02-16 ENCOUNTER — Encounter: Payer: Self-pay | Admitting: Family Medicine

## 2017-02-16 ENCOUNTER — Encounter: Payer: Self-pay | Admitting: Nurse Practitioner

## 2017-02-16 ENCOUNTER — Other Ambulatory Visit: Payer: Self-pay | Admitting: Adult Health

## 2017-02-16 NOTE — Telephone Encounter (Signed)
Received confirmation that the fax was successful.  No further action needed.  Will close the note.

## 2017-02-16 NOTE — Telephone Encounter (Signed)
Order faxed.

## 2017-02-16 NOTE — Telephone Encounter (Signed)
Alteration in activities of daily living

## 2017-02-16 NOTE — Telephone Encounter (Signed)
Need dx code

## 2017-02-16 NOTE — Telephone Encounter (Signed)
[place] for order OT

## 2017-02-20 NOTE — Telephone Encounter (Signed)
Spoke to the pt.  Pt is waiting for an appointment on the same day for both OT and PT.  There was an error in scheduling and pt was scheduled for two different times and days for services.  Wife to call back if we can be of further assistance.

## 2017-02-21 ENCOUNTER — Encounter: Payer: Self-pay | Admitting: Family Medicine
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
## 2017-02-27 ENCOUNTER — Ambulatory Visit (INDEPENDENT_AMBULATORY_CARE_PROVIDER_SITE_OTHER): Payer: Medicare Other | Admitting: Nurse Practitioner

## 2017-02-27 VITALS — BP 110/68 | HR 61 | Ht 69.0 in | Wt 194.0 lb

## 2017-02-27 DIAGNOSIS — I480 Paroxysmal atrial fibrillation: Secondary | ICD-10-CM | POA: Diagnosis not present

## 2017-02-27 DIAGNOSIS — I472 Ventricular tachycardia: Secondary | ICD-10-CM | POA: Diagnosis not present

## 2017-02-27 DIAGNOSIS — N183 Chronic kidney disease, stage 3 unspecified: Secondary | ICD-10-CM

## 2017-02-27 DIAGNOSIS — I5022 Chronic systolic (congestive) heart failure: Secondary | ICD-10-CM

## 2017-02-27 DIAGNOSIS — I2510 Atherosclerotic heart disease of native coronary artery without angina pectoris: Secondary | ICD-10-CM

## 2017-02-27 DIAGNOSIS — I4729 Other ventricular tachycardia: Secondary | ICD-10-CM

## 2017-02-27 LAB — CUP PACEART INCLINIC DEVICE CHECK
Date Time Interrogation Session: 20181009125824
Implantable Lead Implant Date: 20050922
Implantable Lead Location: 753860
Implantable Lead Serial Number: 156891
Implantable Pulse Generator Implant Date: 20100817
MDC IDC LEAD IMPLANT DT: 20050922
MDC IDC LEAD LOCATION: 753859
Pulse Gen Serial Number: 141895

## 2017-02-27 LAB — CUP PACEART REMOTE DEVICE CHECK
Battery Remaining Percentage: 43 %
Brady Statistic RA Percent Paced: 25 %
Brady Statistic RV Percent Paced: 0 %
HighPow Impedance: 55 Ohm
Implantable Lead Implant Date: 20050922
Implantable Lead Serial Number: 156891
Lead Channel Impedance Value: 474 Ohm
Lead Channel Impedance Value: 527 Ohm
Lead Channel Pacing Threshold Amplitude: 0.5 V
Lead Channel Pacing Threshold Pulse Width: 0.4 ms
Lead Channel Pacing Threshold Pulse Width: 0.4 ms
Lead Channel Setting Pacing Amplitude: 2.4 V
Lead Channel Setting Sensing Sensitivity: 0.6 mV
MDC IDC LEAD IMPLANT DT: 20050922
MDC IDC LEAD LOCATION: 753859
MDC IDC LEAD LOCATION: 753860
MDC IDC MSMT BATTERY REMAINING LONGEVITY: 36 mo
MDC IDC MSMT LEADCHNL RV PACING THRESHOLD AMPLITUDE: 0.8 V
MDC IDC PG IMPLANT DT: 20100817
MDC IDC SESS DTM: 20180822082100
MDC IDC SET LEADCHNL RA PACING AMPLITUDE: 2 V
MDC IDC SET LEADCHNL RV PACING PULSEWIDTH: 0.4 ms
Pulse Gen Serial Number: 141895

## 2017-02-27 NOTE — Telephone Encounter (Signed)
LVM

## 2017-02-27 NOTE — Patient Instructions (Signed)
Medication Instructions:   Your physician recommends that you continue on your current medications as directed. Please refer to the Current Medication list given to you today.   If you need a refill on your cardiac medications before your next appointment, please call your pharmacy.  Labwork: NONE ORDERED  TODAY    Testing/Procedures: NONE ORDERED  TODAY   Follow-Up:   AS SCHEDULED   Any Other Special Instructions Will Be Listed Below (If Applicable).                                                                                                                                                   

## 2017-03-02 ENCOUNTER — Encounter: Payer: Medicare Other | Admitting: Internal Medicine

## 2017-03-02 ENCOUNTER — Telehealth: Payer: Self-pay | Admitting: Cardiology

## 2017-03-02 NOTE — Telephone Encounter (Signed)
Patient wife called and stated that they do not have power. She does not have an estimated time of the power being turned back on. I informed pt wife that if pt was to receive ICD therapies to go to the ER to be evaluated. Pt wife verbalized understanding.

## 2017-03-06 ENCOUNTER — Other Ambulatory Visit: Payer: Self-pay | Admitting: Internal Medicine

## 2017-03-07 ENCOUNTER — Other Ambulatory Visit (HOSPITAL_COMMUNITY): Payer: Self-pay | Admitting: Student

## 2017-03-08 ENCOUNTER — Telehealth: Payer: Self-pay | Admitting: *Deleted

## 2017-03-08 NOTE — Telephone Encounter (Signed)
Spoke to patient about episode of VT treated with ATP x 3 from 10/17 @ 21:23. Patient states that he remembers feeling fatigued at the time of the episode, but denies any CP, ShOB, dizziness, or syncope. Patient states that he's been under a lot of stress lately tending to his sick brother. He states that he was late taking his medications yesterday. I reminded patient of the importance of taking all of his medications as Rx'd and on time. Patient verbalized understanding.  Will inform Dr.Allred of episode and call patient if anything further is recommended.   Patient was also reminded of his appt with DB for tomorrow @ CHF clinic.

## 2017-03-09 ENCOUNTER — Encounter (HOSPITAL_COMMUNITY): Payer: Self-pay | Admitting: Internal Medicine

## 2017-03-09 ENCOUNTER — Ambulatory Visit (HOSPITAL_COMMUNITY)
Admission: RE | Admit: 2017-03-09 | Discharge: 2017-03-09 | Disposition: A | Discharge status: Home / Self Care | Payer: Medicare Other | Source: Ambulatory Visit | Attending: Internal Medicine | Admitting: Internal Medicine

## 2017-03-09 VITALS — BP 118/64 | HR 69 | Wt 198.4 lb

## 2017-03-09 DIAGNOSIS — N183 Chronic kidney disease, stage 3 unspecified: Secondary | ICD-10-CM

## 2017-03-09 DIAGNOSIS — Z9889 Other specified postprocedural states: Secondary | ICD-10-CM | POA: Diagnosis not present

## 2017-03-09 DIAGNOSIS — R079 Chest pain, unspecified: Secondary | ICD-10-CM | POA: Insufficient documentation

## 2017-03-09 DIAGNOSIS — I5022 Chronic systolic (congestive) heart failure: Secondary | ICD-10-CM

## 2017-03-09 DIAGNOSIS — Z7901 Long term (current) use of anticoagulants: Secondary | ICD-10-CM | POA: Insufficient documentation

## 2017-03-09 DIAGNOSIS — I480 Paroxysmal atrial fibrillation: Secondary | ICD-10-CM | POA: Diagnosis not present

## 2017-03-09 DIAGNOSIS — I252 Old myocardial infarction: Secondary | ICD-10-CM | POA: Insufficient documentation

## 2017-03-09 DIAGNOSIS — Z95810 Presence of automatic (implantable) cardiac defibrillator: Secondary | ICD-10-CM | POA: Insufficient documentation

## 2017-03-09 DIAGNOSIS — Z8673 Personal history of transient ischemic attack (TIA), and cerebral infarction without residual deficits: Secondary | ICD-10-CM | POA: Insufficient documentation

## 2017-03-09 DIAGNOSIS — I472 Ventricular tachycardia: Secondary | ICD-10-CM | POA: Diagnosis not present

## 2017-03-09 DIAGNOSIS — I2510 Atherosclerotic heart disease of native coronary artery without angina pectoris: Secondary | ICD-10-CM | POA: Diagnosis not present

## 2017-03-09 DIAGNOSIS — E785 Hyperlipidemia, unspecified: Secondary | ICD-10-CM | POA: Diagnosis not present

## 2017-03-09 DIAGNOSIS — M1990 Unspecified osteoarthritis, unspecified site: Secondary | ICD-10-CM | POA: Insufficient documentation

## 2017-03-09 DIAGNOSIS — I130 Hypertensive heart and chronic kidney disease with heart failure and stage 1 through stage 4 chronic kidney disease, or unspecified chronic kidney disease: Secondary | ICD-10-CM | POA: Diagnosis not present

## 2017-03-09 DIAGNOSIS — I4729 Other ventricular tachycardia: Secondary | ICD-10-CM

## 2017-03-09 DIAGNOSIS — I340 Nonrheumatic mitral (valve) insufficiency: Secondary | ICD-10-CM | POA: Insufficient documentation

## 2017-03-09 DIAGNOSIS — Z87891 Personal history of nicotine dependence: Secondary | ICD-10-CM | POA: Insufficient documentation

## 2017-03-09 DIAGNOSIS — K219 Gastro-esophageal reflux disease without esophagitis: Secondary | ICD-10-CM | POA: Diagnosis not present

## 2017-03-09 DIAGNOSIS — I255 Ischemic cardiomyopathy: Secondary | ICD-10-CM | POA: Insufficient documentation

## 2017-03-09 DIAGNOSIS — G4733 Obstructive sleep apnea (adult) (pediatric): Secondary | ICD-10-CM | POA: Diagnosis not present

## 2017-03-09 LAB — COMPREHENSIVE METABOLIC PANEL
ALBUMIN: 4.1 g/dL (ref 3.5–5.0)
ALK PHOS: 56 U/L (ref 38–126)
ALT: 21 U/L (ref 17–63)
ANION GAP: 5 (ref 5–15)
AST: 23 U/L (ref 15–41)
BUN: 19 mg/dL (ref 6–20)
CALCIUM: 9.7 mg/dL (ref 8.9–10.3)
CHLORIDE: 107 mmol/L (ref 101–111)
CO2: 26 mmol/L (ref 22–32)
Creatinine, Ser: 1.52 mg/dL — ABNORMAL HIGH (ref 0.61–1.24)
GFR calc non Af Amer: 45 mL/min — ABNORMAL LOW (ref >60)
GFR, EST AFRICAN AMERICAN: 52 mL/min — AB (ref >60)
GLUCOSE: 196 mg/dL — AB (ref 65–99)
POTASSIUM: 3.7 mmol/L (ref 3.5–5.1)
SODIUM: 138 mmol/L (ref 135–145)
Total Bilirubin: 0.6 mg/dL (ref 0.3–1.2)
Total Protein: 7 g/dL (ref 6.5–8.1)

## 2017-03-09 LAB — MAGNESIUM: Magnesium: 1.8 mg/dL (ref 1.7–2.4)
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
## 2017-03-09 NOTE — Patient Instructions (Signed)
Labs today  Your physician has recommended that you have a cardiopulmonary stress test (CPX). CPX testing is a non-invasive measurement of heart and lung function. It replaces a traditional treadmill stress test. This type of test provides a tremendous amount of information that relates not only to your present condition but also for future outcomes. This test combines measurements of you ventilation, respiratory gas exchange in the lungs, electrocardiogram (EKG), blood pressure and physical response before, during, and following an exercise protocol.  Your physician recommends that you schedule a follow-up appointment in: 2 months

## 2017-03-12 ENCOUNTER — Encounter: Payer: Self-pay | Admitting: Gastroenterology

## 2017-03-12 ENCOUNTER — Other Ambulatory Visit (HOSPITAL_COMMUNITY): Payer: Self-pay | Admitting: Gastroenterology

## 2017-03-12 ENCOUNTER — Ambulatory Visit (INDEPENDENT_AMBULATORY_CARE_PROVIDER_SITE_OTHER): Payer: Medicare Other | Admitting: Gastroenterology

## 2017-03-12 VITALS — BP 104/50 | HR 64 | Ht 69.0 in | Wt 200.2 lb

## 2017-03-12 DIAGNOSIS — R1319 Other dysphagia: Secondary | ICD-10-CM

## 2017-03-12 DIAGNOSIS — R1310 Dysphagia, unspecified: Secondary | ICD-10-CM

## 2017-03-12 DIAGNOSIS — R05 Cough: Secondary | ICD-10-CM

## 2017-03-12 DIAGNOSIS — R0789 Other chest pain: Secondary | ICD-10-CM | POA: Diagnosis not present

## 2017-03-12 DIAGNOSIS — R059 Cough, unspecified: Secondary | ICD-10-CM

## 2017-03-12 DIAGNOSIS — T17308A Unspecified foreign body in larynx causing other injury, initial encounter: Secondary | ICD-10-CM
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
## 2017-03-12 NOTE — Patient Instructions (Signed)
You have been scheduled for a modified barium swallow on 03/20/2017 at 1:00pm. Please arrive 15 minutes prior to your test for registration. You will go to [facility] (1st Floor) for your appointment. Please refrain from eating or drinking anything 4 hours prior to your test. Should you need to cancel or reschedule your appointment, please contact [phone] [facility]) or [phone] [facility]). _____________________________________________________________________ A Modified Barium Swallow Study, or MBS, is a special x-ray that is taken to check swallowing skills. It is carried out by a Stage manager and a Psychologist, clinical (SLP). During this test, yourmouth, throat, and esophagus, a muscular tube which connects your mouth to your stomach, is checked. The test will help you, your doctor, and the SLP plan what types of foods and liquids are easier for you to swallow. The SLP will also identify positions and ways to help you swallow more easily and safely. What will happen during an MBS? You will be taken to an x-ray room and seated comfortably. You will be asked to swallow small amounts of food and liquid mixed with barium. Barium is a liquid or paste that allows images of your mouth, throat and esophagus to be seen on x-ray. The x-ray captures moving images of the food you are swallowing as it travels from your mouth through your throat and into your esophagus. This test helps identify whether food or liquid is entering your lungs (aspiration). The test also shows which part of your mouth or throat lacks strength or coordination to move the food or liquid in the right direction. This test typically takes 30 minutes to 1 hour to complete. _______________________________________________________________________  Dennis Bast have been scheduled for a Barium Esophogram at [facility] (1st floor of the hospital) on 03/20/2017 at 1:30pm. Please arrive 15 minutes prior to your  appointment for registration. Make certain not to have anything to eat or drink 6 hours prior to your test. If you need to reschedule for any reason, please contact radiology at [phone] to do so. __________________________________________________________________ A barium swallow is an examination that concentrates on views of the esophagus. This tends to be a double contrast exam (barium and two liquids which, when combined, create a gas to distend the wall of the oesophagus) or single contrast (non-ionic iodine based). The study is usually tailored to your symptoms so a good history is essential. Attention is paid during the study to the form, structure and configuration of the esophagus, looking for functional disorders (such as aspiration, dysphagia, achalasia, motility and reflux) EXAMINATION You may be asked to change into a gown, depending on the type of swallow being performed. A radiologist and radiographer will perform the procedure. The radiologist will advise you of the type of contrast selected for your procedure and direct you during the exam. You will be asked to stand, sit or lie in several different positions and to hold a small amount of fluid in your mouth before being asked to swallow while the imaging is performed .In some instances you may be asked to swallow barium coated marshmallows to assess the motility of a solid food bolus. The exam can be recorded as a digital or video fluoroscopy procedure. POST PROCEDURE It will take 1-2 days for the barium to pass through your system. To facilitate this, it is important, unless otherwise directed, to increase your fluids for the next 24-48hrs and to resume your normal diet.  This test typically takes about 30 minutes to perform. __________________________________________________________________________________

## 2017-03-13 ENCOUNTER — Ambulatory Visit (INDEPENDENT_AMBULATORY_CARE_PROVIDER_SITE_OTHER): Payer: Medicare Other

## 2017-03-13 DIAGNOSIS — Z23 Encounter for immunization: Secondary | ICD-10-CM | POA: Diagnosis not present

## 2017-03-20 ENCOUNTER — Ambulatory Visit (HOSPITAL_COMMUNITY)
Admission: RE | Admit: 2017-03-20 | Discharge: 2017-03-20 | Disposition: A | Discharge status: Home / Self Care | Payer: Medicare Other | Source: Ambulatory Visit | Attending: Gastroenterology | Admitting: Gastroenterology

## 2017-03-20 DIAGNOSIS — R0789 Other chest pain: Secondary | ICD-10-CM | POA: Diagnosis not present

## 2017-03-20 DIAGNOSIS — R059 Cough, unspecified: Secondary | ICD-10-CM

## 2017-03-20 DIAGNOSIS — T17308A Unspecified foreign body in larynx causing other injury, initial encounter: Secondary | ICD-10-CM

## 2017-03-20 DIAGNOSIS — R05 Cough: Secondary | ICD-10-CM | POA: Diagnosis not present

## 2017-03-20 DIAGNOSIS — K222 Esophageal obstruction: Secondary | ICD-10-CM | POA: Insufficient documentation

## 2017-03-20 DIAGNOSIS — R1319 Other dysphagia: Secondary | ICD-10-CM | POA: Diagnosis not present

## 2017-03-20 DIAGNOSIS — K228 Other specified diseases of esophagus: Secondary | ICD-10-CM | POA: Insufficient documentation

## 2017-03-20 DIAGNOSIS — R1310 Dysphagia, unspecified: Secondary | ICD-10-CM

## 2017-03-21 ENCOUNTER — Ambulatory Visit (HOSPITAL_COMMUNITY): Payer: Medicare Other | Attending: Cardiology

## 2017-03-21 ENCOUNTER — Other Ambulatory Visit (HOSPITAL_COMMUNITY): Payer: Self-pay | Admitting: *Deleted

## 2017-03-21 DIAGNOSIS — I255 Ischemic cardiomyopathy: Secondary | ICD-10-CM | POA: Insufficient documentation

## 2017-03-21 DIAGNOSIS — I252 Old myocardial infarction: Secondary | ICD-10-CM | POA: Diagnosis not present

## 2017-03-21 DIAGNOSIS — Z8673 Personal history of transient ischemic attack (TIA), and cerebral infarction without residual deficits: Secondary | ICD-10-CM | POA: Insufficient documentation

## 2017-03-21 DIAGNOSIS — I5022 Chronic systolic (congestive) heart failure: Secondary | ICD-10-CM | POA: Insufficient documentation

## 2017-03-21 DIAGNOSIS — E785 Hyperlipidemia, unspecified: Secondary | ICD-10-CM | POA: Insufficient documentation

## 2017-03-21 DIAGNOSIS — I472 Ventricular tachycardia: Secondary | ICD-10-CM | POA: Insufficient documentation

## 2017-03-21 DIAGNOSIS — Z87891 Personal history of nicotine dependence: Secondary | ICD-10-CM | POA: Diagnosis not present

## 2017-03-21 DIAGNOSIS — K219 Gastro-esophageal reflux disease without esophagitis: Secondary | ICD-10-CM | POA: Diagnosis not present

## 2017-03-21 DIAGNOSIS — I4891 Unspecified atrial fibrillation: Secondary | ICD-10-CM | POA: Insufficient documentation

## 2017-03-21 DIAGNOSIS — Z7901 Long term (current) use of anticoagulants: Secondary | ICD-10-CM | POA: Insufficient documentation

## 2017-03-21 DIAGNOSIS — Z79899 Other long term (current) drug therapy: Secondary | ICD-10-CM | POA: Diagnosis not present

## 2017-03-21 DIAGNOSIS — N183 Chronic kidney disease, stage 3 (moderate): Secondary | ICD-10-CM | POA: Diagnosis not present

## 2017-03-21 DIAGNOSIS — I130 Hypertensive heart and chronic kidney disease with heart failure and stage 1 through stage 4 chronic kidney disease, or unspecified chronic kidney disease: Secondary | ICD-10-CM | POA: Insufficient documentation

## 2017-03-21 DIAGNOSIS — G4733 Obstructive sleep apnea (adult) (pediatric): Secondary | ICD-10-CM | POA: Diagnosis not present

## 2017-03-22 ENCOUNTER — Other Ambulatory Visit: Payer: Self-pay | Admitting: Adult Health

## 2017-03-22 ENCOUNTER — Telehealth: Payer: Self-pay

## 2017-03-22 MED ORDER — MIRABEGRON ER 25 MG PO TB24: 25.0000 mg | ORAL_TABLET | Freq: Every day | ORAL | 3 refills | Status: DC

## 2017-03-22 NOTE — Telephone Encounter (Signed)
Medication sent to pharmacy  
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
## 2017-03-27 ENCOUNTER — Other Ambulatory Visit (HOSPITAL_COMMUNITY): Payer: Self-pay | Admitting: Pharmacist

## 2017-03-27 MED ORDER — METOPROLOL SUCCINATE ER 50 MG PO TB24: 50.0000 mg | ORAL_TABLET | Freq: Every day | ORAL | 3 refills | Status: DC

## 2017-03-27 MED ORDER — RANOLAZINE ER 500 MG PO TB12: 500.0000 mg | ORAL_TABLET | Freq: Every day | ORAL | 11 refills | Status: DC

## 2017-04-02 ENCOUNTER — Ambulatory Visit (INDEPENDENT_AMBULATORY_CARE_PROVIDER_SITE_OTHER): Payer: Medicare Other | Admitting: Internal Medicine

## 2017-04-02 ENCOUNTER — Ambulatory Visit: Payer: Medicare Other | Admitting: Family Medicine

## 2017-04-02 ENCOUNTER — Encounter: Payer: Self-pay | Admitting: Internal Medicine

## 2017-04-02 VITALS — BP 138/84 | HR 59 | Temp 98.4°F | Wt 199.6 lb

## 2017-04-02 DIAGNOSIS — N183 Chronic kidney disease, stage 3 unspecified: Secondary | ICD-10-CM

## 2017-04-02 DIAGNOSIS — R05 Cough: Secondary | ICD-10-CM

## 2017-04-02 DIAGNOSIS — I5022 Chronic systolic (congestive) heart failure: Secondary | ICD-10-CM | POA: Diagnosis not present

## 2017-04-02 DIAGNOSIS — R058 Other specified cough: Secondary | ICD-10-CM

## 2017-04-02 NOTE — Patient Instructions (Addendum)
Exam is good and no pneumonia sounds  Will tell Tommi Rumps about  Cough medication issues.   Take inhaler prevenetion and as needed .   Warm  Liquids  Hot tea or coffee? And honey may help airway .   Fu is fever  Worse sx   Localized chest pain etc
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
## 2017-04-03 ENCOUNTER — Other Ambulatory Visit: Payer: Self-pay | Admitting: Internal Medicine

## 2017-04-03 NOTE — Progress Notes (Signed)
How is he feeling? There must have been some miscommunication, if he needs cough syrup, I am ok prescribing his this   Hycodan

## 2017-04-04 ENCOUNTER — Encounter: Payer: Self-pay | Admitting: Adult Health

## 2017-04-04 ENCOUNTER — Ambulatory Visit (INDEPENDENT_AMBULATORY_CARE_PROVIDER_SITE_OTHER): Payer: Medicare Other | Admitting: Adult Health

## 2017-04-04 VITALS — BP 120/60 | HR 67 | Temp 98.6°F | Wt 201.0 lb

## 2017-04-04 DIAGNOSIS — R05 Cough: Secondary | ICD-10-CM

## 2017-04-04 DIAGNOSIS — R059 Cough, unspecified: Secondary | ICD-10-CM

## 2017-04-04 MED ORDER — GUAIFENESIN-CODEINE 100-10 MG/5ML PO SOLN: 5.0000 mL | Freq: Three times a day (TID) | ORAL | 0 refills | Status: DC | PRN
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
## 2017-04-10 ENCOUNTER — Other Ambulatory Visit: Payer: Self-pay

## 2017-04-10 ENCOUNTER — Telehealth: Payer: Self-pay | Admitting: Pulmonary Disease

## 2017-04-10 ENCOUNTER — Ambulatory Visit (INDEPENDENT_AMBULATORY_CARE_PROVIDER_SITE_OTHER): Payer: Medicare Other | Admitting: Internal Medicine

## 2017-04-10 DIAGNOSIS — J849 Interstitial pulmonary disease, unspecified: Secondary | ICD-10-CM

## 2017-04-10 LAB — PULMONARY FUNCTION TEST
DL/VA % pred: 80 %
DL/VA: 3.7 ml/min/mmHg/L
DLCO COR % PRED: 64 %
DLCO COR: 20.96 ml/min/mmHg
DLCO unc % pred: 62 %
DLCO unc: 20.28 ml/min/mmHg
FEF 25-75 POST: 2.8 L/s
FEF 25-75 Pre: 2.33 L/sec
FEF2575-%Change-Post: 19 %
FEF2575-%PRED-PRE: 94 %
FEF2575-%Pred-Post: 113 %
FEV1-%Change-Post: 3 %
FEV1-%PRED-POST: 101 %
FEV1-%Pred-Pre: 97 %
FEV1-POST: 2.93 L
FEV1-Pre: 2.81 L
FEV1FVC-%CHANGE-POST: 3 %
FEV1FVC-%PRED-PRE: 101 %
FEV6-%CHANGE-POST: 0 %
FEV6-%PRED-PRE: 99 %
FEV6-%Pred-Post: 100 %
FEV6-Post: 3.68 L
FEV6-Pre: 3.65 L
FEV6FVC-%Pred-Post: 104 %
FEV6FVC-%Pred-Pre: 104 %
FVC-%Change-Post: 0 %
FVC-%PRED-POST: 96 %
FVC-%Pred-Pre: 95 %
FVC-Post: 3.68 L
FVC-Pre: 3.65 L
POST FEV1/FVC RATIO: 80 %
PRE FEV1/FVC RATIO: 77 %
Post FEV6/FVC ratio: 100 %
Pre FEV6/FVC Ratio: 100 %
RV % pred: 92 %
RV: 2.29 L
TLC % PRED: 85 %
TLC: 5.98 L

## 2017-04-10 MED ORDER — BUDESONIDE-FORMOTEROL FUMARATE 160-4.5 MCG/ACT IN AERO: 2.0000 | INHALATION_SPRAY | Freq: Two times a day (BID) | RESPIRATORY_TRACT | 0 refills | Status: DC

## 2017-04-10 NOTE — Telephone Encounter (Signed)
Patient sent in papers for [place] to fill out from [place] regarding a medical alert form. We filled out form and faxed back to correct fax number. This was sent back to [place] and it said that we are only allowed to select one area on the form and we had selected two. I called [employer] to have them send [place] another form to fill out and they stated that we could not have it sent to [place], the patient needs to call and explain why they are needing one and then it can be sent to [place]. I called patients wife and she kept hanging up on me. I called back 3 times to state that we are needing them to call and have the form faxed to [place] if she is needing this to be done on a timely manner. Patients wife continued to hangup on me. I called patient himself on other number provided and gave him the information that I was trying to give his wife. Patient stated that he would call [employer] to have them send the new form. Fax number given was [phone]. I will wait on the form and will give patient a call at the end of the week if we have not received anything.

## 2017-04-10 NOTE — Progress Notes (Signed)
PFT done today. 

## 2017-04-10 NOTE — Telephone Encounter (Signed)
Spoke with pt and advised rx sent to pharmacy  

## 2017-04-11 ENCOUNTER — Ambulatory Visit (INDEPENDENT_AMBULATORY_CARE_PROVIDER_SITE_OTHER): Payer: Medicare Other | Admitting: *Deleted

## 2017-04-11 DIAGNOSIS — I472 Ventricular tachycardia: Secondary | ICD-10-CM

## 2017-04-11 DIAGNOSIS — I4729 Other ventricular tachycardia: Secondary | ICD-10-CM

## 2017-04-11 NOTE — Progress Notes (Signed)
Remote ICD transmission.   

## 2017-04-13 ENCOUNTER — Encounter: Payer: Self-pay | Admitting: Family Medicine

## 2017-04-13 ENCOUNTER — Ambulatory Visit (INDEPENDENT_AMBULATORY_CARE_PROVIDER_SITE_OTHER): Payer: Medicare Other | Admitting: Family Medicine

## 2017-04-13 VITALS — BP 122/88 | Temp 97.6°F | Ht 69.0 in | Wt 200.0 lb

## 2017-04-13 DIAGNOSIS — H9313 Tinnitus, bilateral: Secondary | ICD-10-CM | POA: Diagnosis not present

## 2017-04-13 DIAGNOSIS — J40 Bronchitis, not specified as acute or chronic: Secondary | ICD-10-CM | POA: Diagnosis not present

## 2017-04-13 DIAGNOSIS — H9319 Tinnitus, unspecified ear: Secondary | ICD-10-CM | POA: Insufficient documentation

## 2017-04-13 MED ORDER — PROMETHAZINE-DM 6.25-15 MG/5ML PO SYRP: 5.0000 mL | ORAL_SOLUTION | Freq: Four times a day (QID) | ORAL | 0 refills | Status: DC | PRN

## 2017-04-13 MED ORDER — AZITHROMYCIN 250 MG PO TABS: ORAL_TABLET | ORAL | 0 refills | Status: DC

## 2017-04-13 MED ORDER — PREDNISONE 10 MG PO TABS: ORAL_TABLET | ORAL | 0 refills | Status: DC

## 2017-04-13 NOTE — Patient Instructions (Signed)

## 2017-04-13 NOTE — Assessment & Plan Note (Signed)
May be due to cough / congestion Pt has appointment with ENT next week and will talk to him about it them if no better

## 2017-04-13 NOTE — Assessment & Plan Note (Signed)
con't inhalers pred taper  Cough meds per orders and abx F/u pcp next week if no better
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
## 2017-04-18 ENCOUNTER — Ambulatory Visit (INDEPENDENT_AMBULATORY_CARE_PROVIDER_SITE_OTHER): Payer: Medicare Other | Admitting: Pulmonary Disease

## 2017-04-18 ENCOUNTER — Ambulatory Visit (INDEPENDENT_AMBULATORY_CARE_PROVIDER_SITE_OTHER): Payer: Medicare Other | Admitting: *Deleted

## 2017-04-18 ENCOUNTER — Encounter: Payer: Self-pay | Admitting: Pulmonary Disease

## 2017-04-18 VITALS — BP 130/70 | HR 70 | Ht 69.0 in | Wt 198.0 lb

## 2017-04-18 DIAGNOSIS — G4733 Obstructive sleep apnea (adult) (pediatric): Secondary | ICD-10-CM

## 2017-04-18 DIAGNOSIS — Z9989 Dependence on other enabling machines and devices: Secondary | ICD-10-CM

## 2017-04-18 DIAGNOSIS — J449 Chronic obstructive pulmonary disease, unspecified: Secondary | ICD-10-CM | POA: Diagnosis not present

## 2017-04-18 DIAGNOSIS — J849 Interstitial pulmonary disease, unspecified: Secondary | ICD-10-CM | POA: Diagnosis not present

## 2017-04-18 DIAGNOSIS — R0602 Shortness of breath: Secondary | ICD-10-CM

## 2017-04-18 MED ORDER — ALBUTEROL SULFATE HFA 108 (90 BASE) MCG/ACT IN AERS: 1.0000 | INHALATION_SPRAY | RESPIRATORY_TRACT | 4 refills | Status: DC | PRN

## 2017-04-18 NOTE — Patient Instructions (Addendum)
   Continue using your medications as prescribed.  Call my office if you have any new breathing problems or questions before your next appointment.   We will see you back in 6 month or sooner if needed.

## 2017-04-18 NOTE — Progress Notes (Signed)
SIX MIN WALK 04/18/2017 11/08/2016 08/08/2016 05/02/2016 01/26/2016  Medications - Pt could not remember what medications he took the AM. eliquis,bidil,vimpat,myrbetriq Isosorb Dinitrate-7:30am Xyzal-7:30, Myrbetriq-7:30, entresto-7:30 All AM meds  Supplimental Oxygen during Test? (L/min) No No No No No  Laps 9 7 9 8 9   Partial Lap (in Meters) 0 6 0 14 24  Baseline BP (sitting) 124/80 106/66 126/76 118/64 136/72  Baseline Heartrate 59 68 55 58 55  Baseline Dyspnea (Borg Scale) 0 3 8 3 4   Baseline Fatigue (Borg Scale) 0 0 3 8 2   Baseline SPO2 99 99 98 97 98  BP (sitting) 130/68 120/70 120/70 102/58 138/80  Heartrate 92 84 75 5 64  Dyspnea (Borg Scale) 0 3 6 2 6   Fatigue (Borg Scale) 1 0 4 - 7  SPO2 99 98 96 98 91  BP (sitting) 120/70 114/66 118/64 108/54 122/76  Heartrate 64 64 55 54 55  SPO2 100 100 96 98 98  Stopped or Paused before Six Minutes No No No No No  Interpretation - - - - Angina  Distance Completed [phone]  Tech Comments: finasteride, lasix, isosorbide, vimpat,myrbetriq, prednisone, extresto taken at 7 am//lmr - Pt walked a steady pace, he tolerated his walk well, he did express having some dyspnea during walk but did not stop or pause. He did not express any discomfort or pain during walk Pt. walked at a steady pace, he didn't take any breaks,  Pt completed test without any breaks.
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
## 2017-04-19 ENCOUNTER — Other Ambulatory Visit (HOSPITAL_COMMUNITY): Payer: Self-pay | Admitting: Student

## 2017-04-20 ENCOUNTER — Encounter: Payer: Self-pay | Admitting: Cardiology

## 2017-05-02 ENCOUNTER — Other Ambulatory Visit: Payer: Self-pay | Admitting: Physician Assistant

## 2017-05-03 LAB — CUP PACEART REMOTE DEVICE CHECK
Battery Remaining Percentage: 51 %
Date Time Interrogation Session: 20181119132800
HighPow Impedance: 49 Ohm
Implantable Lead Implant Date: 20050922
Implantable Lead Location: 753860
Implantable Lead Serial Number: 156891
Implantable Pulse Generator Implant Date: 20100817
Lead Channel Impedance Value: 435 Ohm
Lead Channel Pacing Threshold Pulse Width: 0.4 ms
Lead Channel Setting Pacing Amplitude: 2.4 V
Lead Channel Setting Pacing Pulse Width: 0.4 ms
Lead Channel Setting Sensing Sensitivity: 0.6 mV
MDC IDC LEAD IMPLANT DT: 20050922
MDC IDC LEAD LOCATION: 753859
MDC IDC MSMT BATTERY REMAINING LONGEVITY: 42 mo
MDC IDC MSMT LEADCHNL RA IMPEDANCE VALUE: 500 Ohm
MDC IDC MSMT LEADCHNL RA PACING THRESHOLD AMPLITUDE: 0.5 V
MDC IDC MSMT LEADCHNL RA PACING THRESHOLD PULSEWIDTH: 0.4 ms
MDC IDC MSMT LEADCHNL RV PACING THRESHOLD AMPLITUDE: 1 V
MDC IDC SET LEADCHNL RA PACING AMPLITUDE: 2 V
MDC IDC STAT BRADY RA PERCENT PACED: 22 %
MDC IDC STAT BRADY RV PERCENT PACED: 0 %
Pulse Gen Serial Number: 141895
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
## 2017-05-25 ENCOUNTER — Ambulatory Visit (HOSPITAL_COMMUNITY)
Admission: RE | Admit: 2017-05-25 | Discharge: 2017-05-25 | Disposition: A | Discharge status: Home / Self Care | Payer: Medicare Other | Source: Ambulatory Visit | Attending: Internal Medicine | Admitting: Internal Medicine

## 2017-05-25 ENCOUNTER — Encounter (HOSPITAL_COMMUNITY): Payer: Self-pay | Admitting: Internal Medicine

## 2017-05-25 VITALS — BP 134/78 | HR 55 | Wt 199.8 lb

## 2017-05-25 DIAGNOSIS — I480 Paroxysmal atrial fibrillation: Secondary | ICD-10-CM | POA: Insufficient documentation

## 2017-05-25 DIAGNOSIS — E785 Hyperlipidemia, unspecified: Secondary | ICD-10-CM | POA: Insufficient documentation

## 2017-05-25 DIAGNOSIS — Z87891 Personal history of nicotine dependence: Secondary | ICD-10-CM | POA: Diagnosis not present

## 2017-05-25 DIAGNOSIS — Z8249 Family history of ischemic heart disease and other diseases of the circulatory system: Secondary | ICD-10-CM | POA: Insufficient documentation

## 2017-05-25 DIAGNOSIS — Z95810 Presence of automatic (implantable) cardiac defibrillator: Secondary | ICD-10-CM | POA: Diagnosis not present

## 2017-05-25 DIAGNOSIS — N183 Chronic kidney disease, stage 3 (moderate): Secondary | ICD-10-CM | POA: Insufficient documentation

## 2017-05-25 DIAGNOSIS — I2510 Atherosclerotic heart disease of native coronary artery without angina pectoris: Secondary | ICD-10-CM | POA: Insufficient documentation

## 2017-05-25 DIAGNOSIS — J449 Chronic obstructive pulmonary disease, unspecified: Secondary | ICD-10-CM | POA: Insufficient documentation

## 2017-05-25 DIAGNOSIS — Z833 Family history of diabetes mellitus: Secondary | ICD-10-CM | POA: Insufficient documentation

## 2017-05-25 DIAGNOSIS — Z7901 Long term (current) use of anticoagulants: Secondary | ICD-10-CM | POA: Insufficient documentation

## 2017-05-25 DIAGNOSIS — F329 Major depressive disorder, single episode, unspecified: Secondary | ICD-10-CM | POA: Diagnosis not present

## 2017-05-25 DIAGNOSIS — I252 Old myocardial infarction: Secondary | ICD-10-CM | POA: Insufficient documentation

## 2017-05-25 DIAGNOSIS — I5022 Chronic systolic (congestive) heart failure: Secondary | ICD-10-CM

## 2017-05-25 DIAGNOSIS — I472 Ventricular tachycardia, unspecified: Secondary | ICD-10-CM

## 2017-05-25 DIAGNOSIS — J849 Interstitial pulmonary disease, unspecified: Secondary | ICD-10-CM | POA: Diagnosis not present

## 2017-05-25 DIAGNOSIS — M1990 Unspecified osteoarthritis, unspecified site: Secondary | ICD-10-CM | POA: Diagnosis not present

## 2017-05-25 DIAGNOSIS — Z885 Allergy status to narcotic agent status: Secondary | ICD-10-CM | POA: Diagnosis not present

## 2017-05-25 DIAGNOSIS — I255 Ischemic cardiomyopathy: Secondary | ICD-10-CM | POA: Insufficient documentation

## 2017-05-25 DIAGNOSIS — Z9889 Other specified postprocedural states: Secondary | ICD-10-CM | POA: Insufficient documentation

## 2017-05-25 DIAGNOSIS — G4733 Obstructive sleep apnea (adult) (pediatric): Secondary | ICD-10-CM | POA: Insufficient documentation

## 2017-05-25 DIAGNOSIS — Z79899 Other long term (current) drug therapy: Secondary | ICD-10-CM | POA: Insufficient documentation

## 2017-05-25 DIAGNOSIS — I130 Hypertensive heart and chronic kidney disease with heart failure and stage 1 through stage 4 chronic kidney disease, or unspecified chronic kidney disease: Secondary | ICD-10-CM | POA: Diagnosis present

## 2017-05-25 DIAGNOSIS — Z825 Family history of asthma and other chronic lower respiratory diseases: Secondary | ICD-10-CM | POA: Insufficient documentation

## 2017-05-25 DIAGNOSIS — Z8673 Personal history of transient ischemic attack (TIA), and cerebral infarction without residual deficits: Secondary | ICD-10-CM | POA: Diagnosis not present

## 2017-05-25 DIAGNOSIS — Z841 Family history of disorders of kidney and ureter: Secondary | ICD-10-CM | POA: Insufficient documentation

## 2017-05-25 DIAGNOSIS — R079 Chest pain, unspecified: Secondary | ICD-10-CM | POA: Diagnosis not present

## 2017-05-25 DIAGNOSIS — K219 Gastro-esophageal reflux disease without esophagitis: Secondary | ICD-10-CM | POA: Diagnosis not present

## 2017-05-25 DIAGNOSIS — Z888 Allergy status to other drugs, medicaments and biological substances status: Secondary | ICD-10-CM | POA: Diagnosis not present

## 2017-05-25 MED ORDER — SPIRONOLACTONE 25 MG PO TABS: 12.5000 mg | ORAL_TABLET | Freq: Every day | ORAL | 3 refills | Status: DC

## 2017-05-25 NOTE — Patient Instructions (Addendum)
START taking Spironolactone 12.5 mg (0.5 Tablets) Once Daily  Labs in 1 Week (bmet)  Labs in 1 Month (bmet)  Follow up in 4 months, we will contact you to schedule appointment.

## 2017-05-25 NOTE — Addendum Note (Signed)
Encounter addended by: Darron Doom, RN on: 05/25/2017 9:33 AM  Actions taken: Diagnosis association updated, Order list changed, Sign clinical note

## 2017-05-25 NOTE — Addendum Note (Signed)
Encounter addended by: Darron Doom, RN on: 05/25/2017 9:36 AM  Actions taken: Order list changed, Diagnosis association updated, Sign clinical note

## 2017-05-26 ENCOUNTER — Other Ambulatory Visit (HOSPITAL_COMMUNITY): Payer: Self-pay | Admitting: Internal Medicine

## 2017-05-29 ENCOUNTER — Ambulatory Visit: Payer: Medicare Other | Admitting: Neurology

## 2017-05-30 ENCOUNTER — Telehealth: Payer: Self-pay

## 2017-05-30 NOTE — Telephone Encounter (Signed)
Spoke with Ms. Greulich regarding ATP episode from 05/30/17@ 2:53am, she stated that pt was asleep and taken all his medications as prescribed the following day. She is  aware of driving restrictions.

## 2017-05-31 ENCOUNTER — Other Ambulatory Visit (HOSPITAL_COMMUNITY): Payer: Self-pay | Admitting: Internal Medicine

## 2017-06-01 ENCOUNTER — Other Ambulatory Visit (HOSPITAL_COMMUNITY): Payer: Medicare Other

## 2017-06-08 ENCOUNTER — Ambulatory Visit (HOSPITAL_COMMUNITY)
Admission: RE | Admit: 2017-06-08 | Discharge: 2017-06-08 | Disposition: A | Discharge status: Home / Self Care | Payer: Medicare Other | Source: Ambulatory Visit | Attending: Internal Medicine | Admitting: Internal Medicine

## 2017-06-08 ENCOUNTER — Other Ambulatory Visit: Payer: Self-pay | Admitting: Internal Medicine

## 2017-06-08 DIAGNOSIS — I5022 Chronic systolic (congestive) heart failure: Secondary | ICD-10-CM | POA: Diagnosis present

## 2017-06-08 LAB — BASIC METABOLIC PANEL
ANION GAP: 9 (ref 5–15)
BUN: 21 mg/dL — ABNORMAL HIGH (ref 6–20)
CO2: 25 mmol/L (ref 22–32)
Calcium: 9.2 mg/dL (ref 8.9–10.3)
Chloride: 106 mmol/L (ref 101–111)
Creatinine, Ser: 1.72 mg/dL — ABNORMAL HIGH (ref 0.61–1.24)
GFR calc Af Amer: 45 mL/min — ABNORMAL LOW (ref >60)
GFR calc non Af Amer: 39 mL/min — ABNORMAL LOW (ref >60)
GLUCOSE: 166 mg/dL — AB (ref 65–99)
POTASSIUM: 4 mmol/L (ref 3.5–5.1)
Sodium: 140 mmol/L (ref 135–145)

## 2017-06-20 ENCOUNTER — Telehealth: Payer: Self-pay

## 2017-06-20 NOTE — Telephone Encounter (Signed)
Spoke with pt's wife regarding ATP episodes from 06/08/17. She stated that pt was not symptomatic and had taken all his medications as prescribed that day. Aware of driving restrictions.

## 2017-06-22 ENCOUNTER — Other Ambulatory Visit (HOSPITAL_COMMUNITY): Payer: Medicare Other

## 2017-06-23 ENCOUNTER — Other Ambulatory Visit (HOSPITAL_COMMUNITY): Payer: Self-pay | Admitting: Internal Medicine

## 2017-06-23 DIAGNOSIS — I482 Chronic atrial fibrillation, unspecified: Secondary | ICD-10-CM

## 2017-07-04 ENCOUNTER — Encounter: Payer: Self-pay | Admitting: Adult Health

## 2017-07-04 ENCOUNTER — Ambulatory Visit (INDEPENDENT_AMBULATORY_CARE_PROVIDER_SITE_OTHER): Payer: Medicare Other | Admitting: Adult Health

## 2017-07-04 VITALS — BP 110/60 | Temp 99.1°F | Wt 201.0 lb

## 2017-07-04 DIAGNOSIS — R6889 Other general symptoms and signs: Secondary | ICD-10-CM | POA: Diagnosis not present

## 2017-07-04 LAB — POC INFLUENZA A&B (BINAX/QUICKVUE)
INFLUENZA A, POC: POSITIVE — AB
Influenza B, POC: NEGATIVE

## 2017-07-04 MED ORDER — OSELTAMIVIR PHOSPHATE 75 MG PO CAPS: 75.0000 mg | ORAL_CAPSULE | Freq: Two times a day (BID) | ORAL | 0 refills | Status: AC

## 2017-07-04 MED ORDER — PREDNISONE 10 MG PO TABS: 10.0000 mg | ORAL_TABLET | Freq: Every day | ORAL | 0 refills | Status: DC

## 2017-07-04 NOTE — Progress Notes (Signed)
   Subjective:    [name], male    DOB: April 10, 1946, 72 y.o.   MRN: [id number]  Influenza  This is a new problem. The current episode started yesterday. Associated symptoms include congestion, coughing, diaphoresis, fatigue, myalgias, a sore throat and weakness. Pertinent negatives include no chills, fever, nausea or vomiting. Nothing aggravates the symptoms. He has tried acetaminophen (hycodan ) for the symptoms. The treatment provided mild relief.      Review of Systems  Constitutional: Positive for activity change, diaphoresis and fatigue. Negative for chills and fever.  HENT: Positive for congestion and sore throat. Negative for ear discharge, ear pain, sinus pressure, sinus pain and trouble swallowing.   Respiratory: Positive for cough. Negative for chest tightness, shortness of breath and wheezing.   Gastrointestinal: Negative for nausea and vomiting.  Musculoskeletal: Positive for myalgias.  Neurological: Positive for weakness.       Objective:   Physical Exam  Constitutional: He appears well-developed and well-nourished. No distress.  HENT:  Right Ear: Hearing, tympanic membrane, external ear and ear canal normal.  Left Ear: Tympanic membrane and external ear normal.  Nose: Mucosal edema and rhinorrhea present. Right sinus exhibits no maxillary sinus tenderness and no frontal sinus tenderness. Left sinus exhibits no maxillary sinus tenderness and no frontal sinus tenderness.  Mouth/Throat: Uvula is midline and mucous membranes are normal. Posterior oropharyngeal erythema present.  Cardiovascular: Normal rate, regular rhythm, normal heart sounds and intact distal pulses. Exam reveals no gallop and no friction rub.  No murmur heard. Skin: He is not diaphoretic.  Nursing note and vitals reviewed.     Assessment & Plan:  1. Flu-like symptoms - POC Influenza A&B(BINAX/QUICKVUE)- positive. Due to history of COPD will give low dose prednisone  - oseltamivir  (TAMIFLU) 75 MG capsule; Take 1 capsule (75 mg total) by mouth 2 (two) times daily for 5 days.  Dispense: 10 capsule; Refill: 0 - predniSONE (DELTASONE) 10 MG tablet; Take 1 tablet (10 mg total) by mouth daily with breakfast.  Dispense: 5 tablet; Refill: 0   Dorothyann Peng, NP

## 2017-07-09 ENCOUNTER — Other Ambulatory Visit (HOSPITAL_COMMUNITY): Payer: Medicare Other

## 2017-07-10 ENCOUNTER — Ambulatory Visit (HOSPITAL_COMMUNITY)
Admission: RE | Admit: 2017-07-10 | Discharge: 2017-07-10 | Disposition: A | Discharge status: Home / Self Care | Payer: Medicare Other | Source: Ambulatory Visit | Attending: Cardiology | Admitting: Cardiology

## 2017-07-10 ENCOUNTER — Other Ambulatory Visit: Payer: Medicare Other

## 2017-07-10 DIAGNOSIS — I5022 Chronic systolic (congestive) heart failure: Secondary | ICD-10-CM | POA: Insufficient documentation

## 2017-07-10 LAB — BASIC METABOLIC PANEL
ANION GAP: 10 (ref 5–15)
BUN: 19 mg/dL (ref 6–20)
CHLORIDE: 103 mmol/L (ref 101–111)
CO2: 27 mmol/L (ref 22–32)
Calcium: 9.7 mg/dL (ref 8.9–10.3)
Creatinine, Ser: 1.75 mg/dL — ABNORMAL HIGH (ref 0.61–1.24)
GFR calc Af Amer: 44 mL/min — ABNORMAL LOW (ref >60)
GFR, EST NON AFRICAN AMERICAN: 38 mL/min — AB (ref >60)
GLUCOSE: 278 mg/dL — AB (ref 65–99)
POTASSIUM: 3.7 mmol/L (ref 3.5–5.1)
Sodium: 140 mmol/L (ref 135–145)

## 2017-07-11 ENCOUNTER — Ambulatory Visit (INDEPENDENT_AMBULATORY_CARE_PROVIDER_SITE_OTHER): Payer: Medicare Other | Admitting: *Deleted

## 2017-07-11 DIAGNOSIS — I255 Ischemic cardiomyopathy: Secondary | ICD-10-CM

## 2017-07-11 NOTE — Progress Notes (Signed)
Remote ICD transmission.   

## 2017-07-12 ENCOUNTER — Encounter: Payer: Self-pay | Admitting: Cardiology

## 2017-07-18 LAB — CUP PACEART REMOTE DEVICE CHECK
Battery Remaining Percentage: 47 %
Brady Statistic RA Percent Paced: 17 %
Brady Statistic RV Percent Paced: 0 %
HighPow Impedance: 56 Ohm
Implantable Lead Implant Date: 20050922
Implantable Lead Model: 158
Implantable Lead Model: 5076
Lead Channel Impedance Value: 481 Ohm
Lead Channel Impedance Value: 524 Ohm
Lead Channel Pacing Threshold Amplitude: 0.5 V
Lead Channel Setting Pacing Amplitude: 2 V
Lead Channel Setting Pacing Amplitude: 2.4 V
MDC IDC LEAD IMPLANT DT: 20050922
MDC IDC LEAD LOCATION: 753859
MDC IDC LEAD LOCATION: 753860
MDC IDC LEAD SERIAL: 156891
MDC IDC MSMT BATTERY REMAINING LONGEVITY: 42 mo
MDC IDC MSMT LEADCHNL RA PACING THRESHOLD PULSEWIDTH: 0.4 ms
MDC IDC MSMT LEADCHNL RV PACING THRESHOLD AMPLITUDE: 1 V
MDC IDC MSMT LEADCHNL RV PACING THRESHOLD PULSEWIDTH: 0.4 ms
MDC IDC PG IMPLANT DT: 20100817
MDC IDC PG SERIAL: 141895
MDC IDC SESS DTM: 20190220092100
MDC IDC SET LEADCHNL RV PACING PULSEWIDTH: 0.4 ms
MDC IDC SET LEADCHNL RV SENSING SENSITIVITY: 0.6 mV

## 2017-07-23 ENCOUNTER — Ambulatory Visit: Payer: Medicare Other | Attending: Adult Health

## 2017-07-30 ENCOUNTER — Telehealth: Payer: Self-pay | Admitting: *Deleted

## 2017-07-30 NOTE — Telephone Encounter (Signed)
ICD remote transmission alert received. VT with ATP and 1 shock 07/28/17. [name] said that [name] reports sitting down talking to a friend and he felt as if he might fall asleep and knew the shock was coming. He had no other symptoms. Advised about [place] driving restrictions x 6 months. [name] hasn't missed any medications and has an appointment with with Dr. Rayann Heman 08/13/17.

## 2017-07-31 ENCOUNTER — Telehealth: Payer: Self-pay | Admitting: Neurology

## 2017-07-31 ENCOUNTER — Other Ambulatory Visit: Payer: Self-pay

## 2017-07-31 DIAGNOSIS — G40209 Localization-related (focal) (partial) symptomatic epilepsy and epileptic syndromes with complex partial seizures, not intractable, without status epilepticus: Secondary | ICD-10-CM

## 2017-07-31 MED ORDER — LACOSAMIDE 50 MG PO TABS: ORAL_TABLET | ORAL | 3 refills | Status: DC

## 2017-07-31 NOTE — Telephone Encounter (Signed)
[name] called to schedule follow up for May. She also said she would like to get enough samples to last him until that appointment. Please call. Thanks

## 2017-07-31 NOTE — Telephone Encounter (Signed)
LMOM letting pt's wife know that pt has not been seen in over 6 months - no showed his Jan appointment.  Advised wife to call office to schedule f/u - will send Rx (VIMPAT) at that time to last until that appointment.

## 2017-07-31 NOTE — Telephone Encounter (Signed)
[name] [employer] called for [employer]. She said he needs a refill on his medication 50 MG not 75 mg. She lmom and did not say the name of the medication. Thanks

## 2017-08-01 NOTE — Telephone Encounter (Signed)
Rx sent to [facility]

## 2017-08-10 ENCOUNTER — Other Ambulatory Visit: Payer: Self-pay | Admitting: Internal Medicine

## 2017-08-13 ENCOUNTER — Ambulatory Visit (INDEPENDENT_AMBULATORY_CARE_PROVIDER_SITE_OTHER): Payer: Medicare Other | Admitting: Internal Medicine

## 2017-08-13 ENCOUNTER — Encounter: Payer: Self-pay | Admitting: Internal Medicine

## 2017-08-13 VITALS — BP 122/64 | HR 59 | Ht 69.0 in | Wt 198.0 lb

## 2017-08-13 DIAGNOSIS — N183 Chronic kidney disease, stage 3 unspecified: Secondary | ICD-10-CM

## 2017-08-13 DIAGNOSIS — I255 Ischemic cardiomyopathy: Secondary | ICD-10-CM | POA: Diagnosis not present

## 2017-08-13 DIAGNOSIS — I5022 Chronic systolic (congestive) heart failure: Secondary | ICD-10-CM

## 2017-08-13 DIAGNOSIS — I472 Ventricular tachycardia, unspecified: Secondary | ICD-10-CM

## 2017-08-13 DIAGNOSIS — Z95810 Presence of automatic (implantable) cardiac defibrillator: Secondary | ICD-10-CM | POA: Diagnosis not present

## 2017-08-13 DIAGNOSIS — I480 Paroxysmal atrial fibrillation: Secondary | ICD-10-CM | POA: Diagnosis not present

## 2017-08-13 LAB — BASIC METABOLIC PANEL
BUN/Creatinine Ratio: 12 (ref 10–24)
BUN: 23 mg/dL (ref 8–27)
CALCIUM: 9.8 mg/dL (ref 8.6–10.2)
CHLORIDE: 104 mmol/L (ref 96–106)
CO2: 24 mmol/L (ref 20–29)
Creatinine, Ser: 1.9 mg/dL — ABNORMAL HIGH (ref 0.76–1.27)
GFR calc Af Amer: 40 mL/min/{1.73_m2} — ABNORMAL LOW (ref >59)
GFR, EST NON AFRICAN AMERICAN: 35 mL/min/{1.73_m2} — AB (ref >59)
GLUCOSE: 168 mg/dL — AB (ref 65–99)
POTASSIUM: 4.4 mmol/L (ref 3.5–5.2)
SODIUM: 139 mmol/L (ref 134–144)

## 2017-08-13 LAB — MAGNESIUM: MAGNESIUM: 2.2 mg/dL (ref 1.6–2.3)
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
## 2017-08-13 NOTE — Patient Instructions (Addendum)
Medication Instructions:  Your physician recommends that you continue on your current medications as directed. Please refer to the Current Medication list given to you today.  Labwork: You will get lab work today:  [place].    Testing/Procedures: None ordered.  Follow-Up: Your physician wants you to follow-up in:6 months with Chanetta Marshall, NP.   You will receive a reminder letter in the mail two months in advance. If you don't receive a letter, please call our office to schedule the follow-up appointment.  Remote monitoring is used to monitor your ICD from home. This monitoring reduces the number of office visits required to check your device to one time per year. It allows [place] to keep an eye on the functioning of your device to ensure it is working properly. You are scheduled for a device check from home on 10/10/2017. You may send your transmission at any time that day. If you have a wireless device, the transmission will be sent automatically. After your physician reviews your transmission, you will receive a postcard with your next transmission date.  Any Other Special Instructions Will Be Listed Below (If Applicable).  If you need a refill on your cardiac medications before your next appointment, please call your pharmacy.

## 2017-08-15 ENCOUNTER — Telehealth: Payer: Self-pay | Admitting: Internal Medicine

## 2017-08-15 ENCOUNTER — Ambulatory Visit: Payer: Medicare Other | Admitting: Podiatry

## 2017-08-15 NOTE — Telephone Encounter (Signed)
New message    Patient wife calling, NO lab results should go to PCP.  Advised spouse the labs had already been sent to PCP per nurse.  Spouse requesting call from nurse to confirm info sent to [place] going forward.

## 2017-08-15 NOTE — Telephone Encounter (Signed)
Spoke with patient's wife in regards to his lab work.. She would like them sent to [place] (Dr Justin Mend).. I told her that I would forward.Marland Kitchen

## 2017-08-20 ENCOUNTER — Encounter: Payer: Self-pay | Admitting: Podiatry

## 2017-08-20 ENCOUNTER — Ambulatory Visit (INDEPENDENT_AMBULATORY_CARE_PROVIDER_SITE_OTHER): Payer: Medicare Other | Admitting: Podiatry

## 2017-08-20 ENCOUNTER — Other Ambulatory Visit (HOSPITAL_COMMUNITY): Payer: Self-pay | Admitting: Internal Medicine

## 2017-08-20 DIAGNOSIS — B351 Tinea unguium: Secondary | ICD-10-CM | POA: Diagnosis not present

## 2017-08-20 DIAGNOSIS — M79676 Pain in unspecified toe(s): Secondary | ICD-10-CM

## 2017-08-22 ENCOUNTER — Other Ambulatory Visit (HOSPITAL_COMMUNITY): Payer: Self-pay | Admitting: Internal Medicine
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
## 2017-08-28 ENCOUNTER — Other Ambulatory Visit (HOSPITAL_COMMUNITY): Payer: Self-pay | Admitting: Internal Medicine

## 2017-09-07 ENCOUNTER — Emergency Department (HOSPITAL_COMMUNITY)
Admission: EM | Admit: 2017-09-07 | Discharge: 2017-09-07 | Disposition: A | Discharge status: Home / Self Care | Payer: Medicare Other | Attending: Emergency Medicine | Admitting: Emergency Medicine

## 2017-09-07 ENCOUNTER — Other Ambulatory Visit: Payer: Self-pay

## 2017-09-07 ENCOUNTER — Telehealth: Payer: Self-pay | Admitting: *Deleted

## 2017-09-07 ENCOUNTER — Emergency Department (HOSPITAL_COMMUNITY): Payer: Medicare Other

## 2017-09-07 ENCOUNTER — Encounter (HOSPITAL_COMMUNITY): Payer: Self-pay

## 2017-09-07 DIAGNOSIS — I519 Heart disease, unspecified: Secondary | ICD-10-CM

## 2017-09-07 DIAGNOSIS — Z87891 Personal history of nicotine dependence: Secondary | ICD-10-CM | POA: Insufficient documentation

## 2017-09-07 DIAGNOSIS — I130 Hypertensive heart and chronic kidney disease with heart failure and stage 1 through stage 4 chronic kidney disease, or unspecified chronic kidney disease: Secondary | ICD-10-CM | POA: Diagnosis not present

## 2017-09-07 DIAGNOSIS — I472 Ventricular tachycardia, unspecified: Secondary | ICD-10-CM

## 2017-09-07 DIAGNOSIS — Z7901 Long term (current) use of anticoagulants: Secondary | ICD-10-CM | POA: Diagnosis not present

## 2017-09-07 DIAGNOSIS — Z79899 Other long term (current) drug therapy: Secondary | ICD-10-CM | POA: Insufficient documentation

## 2017-09-07 DIAGNOSIS — J449 Chronic obstructive pulmonary disease, unspecified: Secondary | ICD-10-CM | POA: Insufficient documentation

## 2017-09-07 DIAGNOSIS — N183 Chronic kidney disease, stage 3 (moderate): Secondary | ICD-10-CM | POA: Insufficient documentation

## 2017-09-07 DIAGNOSIS — R110 Nausea: Secondary | ICD-10-CM | POA: Insufficient documentation

## 2017-09-07 DIAGNOSIS — I255 Ischemic cardiomyopathy: Secondary | ICD-10-CM | POA: Diagnosis not present

## 2017-09-07 DIAGNOSIS — Z8673 Personal history of transient ischemic attack (TIA), and cerebral infarction without residual deficits: Secondary | ICD-10-CM | POA: Diagnosis not present

## 2017-09-07 DIAGNOSIS — I5022 Chronic systolic (congestive) heart failure: Secondary | ICD-10-CM | POA: Insufficient documentation

## 2017-09-07 DIAGNOSIS — I252 Old myocardial infarction: Secondary | ICD-10-CM | POA: Insufficient documentation

## 2017-09-07 LAB — BASIC METABOLIC PANEL
ANION GAP: 11 (ref 5–15)
BUN: 23 mg/dL — ABNORMAL HIGH (ref 6–20)
CALCIUM: 9.4 mg/dL (ref 8.9–10.3)
CO2: 20 mmol/L — ABNORMAL LOW (ref 22–32)
Chloride: 108 mmol/L (ref 101–111)
Creatinine, Ser: 2.1 mg/dL — ABNORMAL HIGH (ref 0.61–1.24)
GFR, EST AFRICAN AMERICAN: 35 mL/min — AB (ref >60)
GFR, EST NON AFRICAN AMERICAN: 30 mL/min — AB (ref >60)
Glucose, Bld: 201 mg/dL — ABNORMAL HIGH (ref 65–99)
Potassium: 4.3 mmol/L (ref 3.5–5.1)
Sodium: 139 mmol/L (ref 135–145)

## 2017-09-07 LAB — I-STAT BETA HCG BLOOD, ED (MC, WL, AP ONLY): I-stat hCG, quantitative: <5 m[IU]/mL (ref <5)

## 2017-09-07 LAB — I-STAT TROPONIN, ED: TROPONIN I, POC: 0.04 ng/mL (ref 0.00–0.08)

## 2017-09-07 LAB — CBC WITH DIFFERENTIAL/PLATELET
BASOS ABS: 0 10*3/uL (ref 0.0–0.1)
BASOS PCT: 0 %
Eosinophils Absolute: 0.1 10*3/uL (ref 0.0–0.7)
Eosinophils Relative: 2 %
HEMATOCRIT: 42.3 % (ref 39.0–52.0)
HEMOGLOBIN: 14.2 g/dL (ref 13.0–17.0)
Lymphocytes Relative: 36 %
Lymphs Abs: 2.4 10*3/uL (ref 0.7–4.0)
MCH: 30.7 pg (ref 26.0–34.0)
MCHC: 33.6 g/dL (ref 30.0–36.0)
MCV: 91.4 fL (ref 78.0–100.0)
Monocytes Absolute: 0.6 10*3/uL (ref 0.1–1.0)
Monocytes Relative: 9 %
NEUTROS ABS: 3.6 10*3/uL (ref 1.7–7.7)
NEUTROS PCT: 53 %
Platelets: 251 10*3/uL (ref 150–400)
RBC: 4.63 MIL/uL (ref 4.22–5.81)
RDW: 14.4 % (ref 11.5–15.5)
WBC: 6.8 10*3/uL (ref 4.0–10.5)

## 2017-09-07 MED ORDER — AMIODARONE IV BOLUS ONLY 150 MG/100ML
150.0000 mg | Freq: Once | INTRAVENOUS | Status: AC
  Administered 2017-09-07: 150 mg via INTRAVENOUS
  Filled 2017-09-07: qty 100

## 2017-09-07 MED ORDER — METOPROLOL SUCCINATE ER 50 MG PO TB24: 50.0000 mg | ORAL_TABLET | Freq: Every evening | ORAL | 3 refills | Status: DC

## 2017-09-07 MED ORDER — METOPROLOL SUCCINATE ER 25 MG PO TB24: 25.0000 mg | ORAL_TABLET | Freq: Every morning | ORAL | 3 refills | Status: DC

## 2017-09-07 MED ORDER — SODIUM CHLORIDE 0.9 % IV BOLUS
500.0000 mL | Freq: Once | INTRAVENOUS | Status: AC
  Administered 2017-09-07: 500 mL via INTRAVENOUS

## 2017-09-07 MED ORDER — DILTIAZEM HCL-DEXTROSE 100-5 MG/100ML-% IV SOLN (PREMIX)
5.0000 mg/h | Freq: Once | INTRAVENOUS | Status: AC
  Administered 2017-09-07: 5 mg/h via INTRAVENOUS
  Filled 2017-09-07: qty 100

## 2017-09-07 MED ORDER — DILTIAZEM HCL 25 MG/5ML IV SOLN
10.0000 mg | Freq: Once | INTRAVENOUS | Status: DC
Start: 2017-09-07 — End: 2017-09-07

## 2017-09-07 MED ORDER — METOPROLOL SUCCINATE ER 25 MG PO TB24
25.0000 mg | ORAL_TABLET | Freq: Every day | ORAL | Status: DC
  Administered 2017-09-07: 25 mg via ORAL
  Filled 2017-09-07: qty 1

## 2017-09-07 NOTE — Telephone Encounter (Signed)
[employer] ICD home monitor alert received- VT with ATP (unsuccessful), presenting EGM at 4:22am shows VT ~145bpm. LMOM (per DPR) for Mrs. Ingham to call EMS and have Mr. Rasnic transported to the hospital. Will continue to try to get in touch with pt/wife.

## 2017-09-07 NOTE — ED Triage Notes (Signed)
[facility]- pt coming from home, feeling weak since last night. Pt's HR noted to be between 120-140 with EMS. He has a Investment banker, corporate. BP 83/59, cbg 239.
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
## 2017-09-07 NOTE — ED Notes (Signed)
Pt stable, ambulatory, states understanding of discharge instructions 

## 2017-09-07 NOTE — Telephone Encounter (Signed)
After multiple attempts I reached Mr and Mrs [name]- they noticed his heart racing last night and Mr. Drewes felt nauseous, they did not go to bed until after 3am. I advised them to send a repeat transmission as Mr. Sandlin reports he feels a little better now. Transmission received, VT noted 140-150bpm. I have advised Mrs. Taaffe to call EMS to transport Mr. Matuszak to the hospital, I advised against driving. She verbalizes understanding and is appreciative.

## 2017-09-07 NOTE — Discharge Instructions (Addendum)
Change her metoprolol dose from 50 mg at night to 25 mg in the morning and 50 mg at night.  Follow-up with your cardiologist as directed.  Return here for any worsening symptoms.

## 2017-09-09 NOTE — Telephone Encounter (Signed)
Seen by me in the ED.  See my consult note.

## 2017-09-12 ENCOUNTER — Other Ambulatory Visit: Payer: Self-pay | Admitting: Internal Medicine

## 2017-09-14 ENCOUNTER — Telehealth: Payer: Self-pay | Admitting: Adult Health

## 2017-09-14 MED ORDER — ALBUTEROL SULFATE HFA 108 (90 BASE) MCG/ACT IN AERS: 1.0000 | INHALATION_SPRAY | RESPIRATORY_TRACT | 0 refills | Status: DC | PRN

## 2017-09-14 NOTE — Telephone Encounter (Signed)
Pt has scheduled an appt with SG on May 9.  Called and spoke with pt's spouse [name] stating since pt has made an appt I would send 1 symbicort inhaler to their pharmacy at this time and at pt's appt we could then send another script with more refills to pt's pharmacy.  [name] expressed understanding. Verified pt's pharmacy and sent script in. Nothing further needed at this time.

## 2017-09-17 ENCOUNTER — Encounter (INDEPENDENT_AMBULATORY_CARE_PROVIDER_SITE_OTHER): Payer: Self-pay

## 2017-09-17 ENCOUNTER — Telehealth: Payer: Self-pay | Admitting: Acute Care

## 2017-09-17 ENCOUNTER — Encounter: Payer: Self-pay | Admitting: Internal Medicine

## 2017-09-17 ENCOUNTER — Ambulatory Visit (INDEPENDENT_AMBULATORY_CARE_PROVIDER_SITE_OTHER): Payer: Medicare Other | Admitting: Internal Medicine

## 2017-09-17 VITALS — BP 122/62 | HR 55 | Ht 69.0 in | Wt 200.0 lb

## 2017-09-17 DIAGNOSIS — I5022 Chronic systolic (congestive) heart failure: Secondary | ICD-10-CM

## 2017-09-17 DIAGNOSIS — I480 Paroxysmal atrial fibrillation: Secondary | ICD-10-CM

## 2017-09-17 DIAGNOSIS — I472 Ventricular tachycardia, unspecified: Secondary | ICD-10-CM

## 2017-09-17 MED ORDER — MEXILETINE HCL 150 MG PO CAPS: 150.0000 mg | ORAL_CAPSULE | Freq: Two times a day (BID) | ORAL | 3 refills | Status: DC

## 2017-09-17 MED ORDER — BUDESONIDE-FORMOTEROL FUMARATE 160-4.5 MCG/ACT IN AERO: 2.0000 | INHALATION_SPRAY | Freq: Two times a day (BID) | RESPIRATORY_TRACT | 6 refills | Status: DC

## 2017-09-17 NOTE — Patient Instructions (Signed)
Medication Instructions:  Your physician has recommended you make the following change in your medication:  1. DECREASE Metoprolol to 50 mg every evening 2. START Mexiletine 150 mg twice daily  Labwork: None ordered  Testing/Procedures: None ordered  Follow-Up: Your physician recommends that you schedule a follow-up appointment in: 4 weeks with Dr. Rayann Heman.  * If you need a refill on your cardiac medications before your next appointment, please call your pharmacy.   *Please note that any paperwork needing to be filled out by the provider will need to be addressed at the front desk prior to seeing the provider. Please note that any FMLA, disability or other documents regarding health condition is subject to a $25.00 charge that must be received prior to completion of paperwork in the form of a money order or check.  Thank you for choosing [facility]!!   Trinidad Curet, RN [phone]  Any Other Special Instructions Will Be Listed Below (If Applicable).
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
## 2017-09-17 NOTE — Telephone Encounter (Signed)
Called and spoke to pt's wife. Pt is needing a refill on pt's Symbicort. Rx sent to preferred pharmacy. Pt verbalized understanding and denied any further questions or concerns at this time.

## 2017-09-24 ENCOUNTER — Other Ambulatory Visit: Payer: Self-pay | Admitting: Internal Medicine

## 2017-09-25 ENCOUNTER — Encounter: Payer: Self-pay | Admitting: Adult Health

## 2017-09-25 ENCOUNTER — Ambulatory Visit (INDEPENDENT_AMBULATORY_CARE_PROVIDER_SITE_OTHER): Payer: Medicare Other | Admitting: Adult Health

## 2017-09-25 VITALS — BP 124/70 | Temp 98.3°F | Wt 196.0 lb

## 2017-09-25 DIAGNOSIS — M25511 Pain in right shoulder: Secondary | ICD-10-CM

## 2017-09-25 MED ORDER — CYCLOBENZAPRINE HCL 10 MG PO TABS: 10.0000 mg | ORAL_TABLET | Freq: Three times a day (TID) | ORAL | 0 refills | Status: DC | PRN
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
## 2017-09-27 ENCOUNTER — Ambulatory Visit: Payer: Medicare Other | Admitting: Acute Care

## 2017-09-28 ENCOUNTER — Ambulatory Visit (INDEPENDENT_AMBULATORY_CARE_PROVIDER_SITE_OTHER): Payer: Medicare Other | Admitting: Family Medicine

## 2017-09-28 ENCOUNTER — Encounter: Payer: Self-pay | Admitting: Family Medicine

## 2017-09-28 VITALS — BP 140/86 | HR 60 | Temp 98.4°F | Ht 69.0 in | Wt 193.4 lb

## 2017-09-28 DIAGNOSIS — M549 Dorsalgia, unspecified: Secondary | ICD-10-CM

## 2017-09-28 MED ORDER — METHYLPREDNISOLONE ACETATE 40 MG/ML IJ SUSP
40.0000 mg | Freq: Once | INTRAMUSCULAR | Status: AC
  Administered 2017-09-28: 40 mg via INTRA_ARTICULAR

## 2017-09-28 NOTE — Patient Instructions (Signed)
Trigger Point Injection  Trigger points are areas where you have pain. A trigger point injection is a shot given in the trigger point to help relieve pain for a few days to a few months. Common places for trigger points include:  · The neck.  · The shoulders.  · The upper back.  · The lower back.    A trigger point injection will not cure long-lasting (chronic) pain permanently. These injections do not always work for every person, but for some people they can help to relieve pain for a few days to a few months.  Tell a health care provider about:  · Any allergies you have.  · All medicines you are taking, including vitamins, herbs, eye drops, creams, and over-the-counter medicines.  · Any problems you or family members have had with anesthetic medicines.  · Any blood disorders you have.  · Any surgeries you have had.  · Any medical conditions you have.  What are the risks?  Generally, this is a safe procedure. However, problems may occur, including:  · Infection.  · Bleeding.  · Allergic reaction to the injected medicine.  · Irritation of the skin around the injection site.    What happens before the procedure?  · Ask your health care provider about changing or stopping your regular medicines. This is especially important if you are taking diabetes medicines or blood thinners.  What happens during the procedure?  · Your health care provider will feel for trigger points. A marker may be used to circle the area for the injection.  · The skin over the trigger point will be washed with a germ-killing (antiseptic) solution.  · A thin needle is used for the shot. You may feel pain or a twitching feeling when the needle enters the trigger point.  · A numbing solution may be injected into the trigger point. Sometimes a medicine to keep down swelling, redness, and warmth (inflammation) is also injected.  · Your health care provider may move the needle around the area where the trigger point is located until the tightness  and twitching goes away.  · After the injection, your health care provider may put gentle pressure over the injection site.  · The injection site will be covered with a bandage (dressing).  The procedure may vary among health care providers and hospitals.  What happens after the procedure?  · The dressing can be taken off in a few hours or as told by your health care provider.  · You may feel sore and stiff for 1-2 days.  This information is not intended to replace advice given to you by your health care provider. Make sure you discuss any questions you have with your health care provider.  Document Released: 04/27/2011 Document Revised: 01/09/2016 Document Reviewed: 10/26/2014  Elsevier Interactive Patient Education © 2018 Elsevier Inc.
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
## 2017-10-01 ENCOUNTER — Ambulatory Visit (INDEPENDENT_AMBULATORY_CARE_PROVIDER_SITE_OTHER): Payer: Medicare Other | Admitting: Acute Care

## 2017-10-01 ENCOUNTER — Ambulatory Visit (INDEPENDENT_AMBULATORY_CARE_PROVIDER_SITE_OTHER): Payer: Medicare Other | Admitting: Family Medicine

## 2017-10-01 ENCOUNTER — Ambulatory Visit: Payer: Self-pay

## 2017-10-01 ENCOUNTER — Encounter: Payer: Self-pay | Admitting: Family Medicine

## 2017-10-01 ENCOUNTER — Encounter: Payer: Self-pay | Admitting: Acute Care

## 2017-10-01 ENCOUNTER — Ambulatory Visit: Payer: Medicare Other | Admitting: Family Medicine

## 2017-10-01 ENCOUNTER — Telehealth: Payer: Self-pay | Admitting: Internal Medicine

## 2017-10-01 VITALS — BP 120/80 | HR 54 | Ht 69.0 in | Wt 193.2 lb

## 2017-10-01 VITALS — BP 102/80 | HR 65 | Temp 98.3°F | Wt 191.5 lb

## 2017-10-01 DIAGNOSIS — Z9989 Dependence on other enabling machines and devices: Secondary | ICD-10-CM

## 2017-10-01 DIAGNOSIS — G4733 Obstructive sleep apnea (adult) (pediatric): Secondary | ICD-10-CM | POA: Diagnosis not present

## 2017-10-01 DIAGNOSIS — J449 Chronic obstructive pulmonary disease, unspecified: Secondary | ICD-10-CM | POA: Insufficient documentation

## 2017-10-01 DIAGNOSIS — M79601 Pain in right arm: Secondary | ICD-10-CM | POA: Diagnosis not present

## 2017-10-01 DIAGNOSIS — J849 Interstitial pulmonary disease, unspecified: Secondary | ICD-10-CM

## 2017-10-01 MED ORDER — BUDESONIDE-FORMOTEROL FUMARATE 160-4.5 MCG/ACT IN AERO: 2.0000 | INHALATION_SPRAY | Freq: Two times a day (BID) | RESPIRATORY_TRACT | 6 refills | Status: DC

## 2017-10-01 MED ORDER — HYDROCODONE-ACETAMINOPHEN 5-325 MG PO TABS: 1.0000 | ORAL_TABLET | Freq: Four times a day (QID) | ORAL | 0 refills | Status: DC | PRN

## 2017-10-01 NOTE — Assessment & Plan Note (Signed)
Poor control despite good compliance Plan We will order a CPAP titration to see if we can get your OSA better controlled. Follow up 1 month after CPAP changes with down Load with Sarah NP Continue on CPAP at bedtime. You appear to be benefiting from the treatment Goal is to wear for at least 6 hours each night for maximal clinical benefit. Continue to work on weight loss, as the link between excess weight  and sleep apnea is well established.  Do not drive if sleepy. Remember to clean mask, tubing, filter, and reservoir once weekly with soapy water.  Follow up with Dr. Halford Chessman  1 month after any CPAP changes with download. Please contact office for sooner follow up if symptoms do not improve or worsen or seek emergency care

## 2017-10-01 NOTE — Patient Instructions (Signed)
Let me know in one week if pain no better.  May need to consider neck films to further assess.

## 2017-10-01 NOTE — Patient Instructions (Addendum)
It is nice to meet you. We will order a CPAP titration to see if we can get your OSA better controlled. Continue Symbicort 2 puffs twice daily without fail. Rinse mouth after use. Continue using your rescue inhaler as needed. We will order Spirometry with DLCO We will schedule a repeat HRCT, Supine and prone, to be read by Dr. Weber Cooks or [facility]. Follow up in ILD clinic with Dr. Chase Caller in ILD clinic at next available. Follow up 1 month after CPAP changes with down Load with Antwonette Feliz NP Please contact office for sooner follow up if symptoms do not improve or worsen or seek emergency care
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
## 2017-10-01 NOTE — Telephone Encounter (Signed)
Patient's wife called in with c/o "right arm pain." She says "he was in the office on Friday and received a cortisone shot in his arm. Every since Saturday, the pain and spasms are worse. You can see knots on his arm from the muscles spasms.  Pain and spasms are from his neck to his shoulder blade all the way down his arm. The pain is so bad he doesn't want to drink or eat." I asked what caused the pain and how long, she says "it started 1 week ago when his medications were changed and every time he has a medication change, he has problems." I asked about other symptoms, she and he denies. According to protocol, see PCP within 4 hours, no availability with PCP, appointment scheduled with Dr. Elease Hashimoto today at 1545, care advice given, patient and wife verbalized understanding.  Reason for Disposition . [1] Arm pains with exertion (e.g., walking) AND [2] pain goes away on resting AND [3] not present now  Answer Assessment - Initial Assessment Questions 1. ONSET: "When did the pain start?"     1 week ago; knots on the arm 2 days ago 2. LOCATION: "Where is the pain located?"     Right arm 3. PAIN: "How bad is the pain?" (Scale 1-10; or mild, moderate, severe)   - MILD (1-3): doesn't interfere with normal activities   - MODERATE (4-7): interferes with normal activities (e.g., work or school) or awakens from sleep   - SEVERE (8-10): excruciating pain, unable to do any normal activities, unable to hold a cup of water     Severe 4. WORK OR EXERCISE: "Has there been any recent work or exercise that involved this part of the body?"     No 5. CAUSE: "What do you think is causing the arm pain?"     Possible medication changes 6. OTHER SYMPTOMS: "Do you have any other symptoms?" (e.g., neck pain, swelling, rash, fever, numbness, weakness)     Neck down his arm, muscle spasms, right shoulder blade pain 7. PREGNANCY: "Is there any chance you are pregnant?" "When was your last menstrual period?"      N/A  Protocols used: ARM PAIN-A-AH

## 2017-10-01 NOTE — Assessment & Plan Note (Signed)
Symptomology is stable Discussed with Dr. Chase Caller Plan We will order Spirometry with DLCO We will schedule a repeat HRCT, Supine and prone, inspiratory and expiratory ,  to be read by Dr. Weber Cooks or [facility]. Follow up in ILD clinic with Dr. Chase Caller in ILD clinic at next available. Please contact office for sooner follow up if symptoms do not improve or worsen or seek emergency care

## 2017-10-01 NOTE — Assessment & Plan Note (Signed)
Stable on Symbicort 2 puffs twice daily Rare use of rescue inhaler when compliant with Symbicort Plan Reminded patient of the importance of using Symbicort 2 puffs twice daily without fail as maintenance Continue Symbicort 2 puffs twice daily Continue rescue inhaler for breakthrough shortness of breath Follow-up with Dr. Chase Caller in ILD clinic at first available Please contact office for sooner follow up if symptoms do not improve or worsen or seek emergency care

## 2017-10-01 NOTE — Telephone Encounter (Signed)
New message   Pt c/o medication issue:  1. Name of Medication: mexiletine (MEXITIL) 150 MG capsule  2. How are you currently taking this medication (dosage and times per day)? Take 1 capsule (150 mg total) by mouth 2 (two) times daily.  3. Are you having a reaction (difficulty breathing--STAT)? NO  4. What is your medication issue? States medication is causing extreme muscle pain, back, arm, neck.

## 2017-10-02 NOTE — Telephone Encounter (Signed)
Discussed mexilitine with pharmacy and EP APP.  Both entities voiced highly unlikely for mexilitine to cause muscle pain.  Call placed to Pt.  Spoke with Pt wife per DPR.  Per wife Pt developed muscle pain with feeling of contractions after starting mexilitine.  Per wife every time Pt starts a new medication he has a similar reaction.  Advised wife it was probably not the mexilitine.  Wife insists that at is.   Spoke with APP.  Pt could stop mexilitine, but would be at risk for return of VT and risk of shock.  Advised wife.  Wife verbalized to Pt.  Wife and Pt will stop mexilitine today, this nurse will call on Thursday.  Family thinks they will know by then if the  mexilitine is causing the muscle pain.   Will discuss with [facility].  Pt/wife open to ablation at this time.  Would like for Pt to stay overnight after ablation. Will cont to monitor.

## 2017-10-04 NOTE — Telephone Encounter (Signed)
Returned call to Pt wife.  Per Pt's wife Pt states his back is feeling better but he still has arm pain since discontinuing Mexitil.  Explained again to wife with Pt that if he continues to not take Mexitil he would possibly get shocked by his device.  Pt wife trying to get Pt to respond, Pt would not.  Per wife she had removed his night Mexitil dose from his pill box but not his AM dose.  Per wife AM dose of Mexitil is gone.  Pt recently saw PCP for this new back/arm pain.  Pt received flexeril, per wife Pt had already taken every pill but one of that prescription.  Wife states she hides Pt Norco and only allows him to have at bedtime.   Pt has been driving (Pt wife states I did not hear that).  Pt wife is frustrated also.  She states she will put the Mexitil back in for Pt's night dose, but it appears Pt knows what the pill looks like (unsure if he will take it).   Pt has f/u appt with Dr. Haroldine Laws on Monday.  Will monitor. Have advised device clinic of Pt's noncompliance.  Dr. Rayann Heman also aware.

## 2017-10-04 NOTE — Telephone Encounter (Signed)
Follow up    Patients wife is returning call in reference to medication and how herr spouse is doing

## 2017-10-04 NOTE — Telephone Encounter (Signed)
Left VM per DPR. Await call back.

## 2017-10-08 ENCOUNTER — Ambulatory Visit (HOSPITAL_COMMUNITY)
Admission: RE | Admit: 2017-10-08 | Discharge: 2017-10-08 | Disposition: A | Discharge status: Home / Self Care | Payer: Medicare Other | Source: Ambulatory Visit | Attending: Internal Medicine | Admitting: Internal Medicine

## 2017-10-08 VITALS — BP 129/72 | HR 55 | Wt 190.4 lb

## 2017-10-08 DIAGNOSIS — Z833 Family history of diabetes mellitus: Secondary | ICD-10-CM | POA: Insufficient documentation

## 2017-10-08 DIAGNOSIS — I472 Ventricular tachycardia, unspecified: Secondary | ICD-10-CM

## 2017-10-08 DIAGNOSIS — Z87891 Personal history of nicotine dependence: Secondary | ICD-10-CM | POA: Insufficient documentation

## 2017-10-08 DIAGNOSIS — Z9889 Other specified postprocedural states: Secondary | ICD-10-CM | POA: Diagnosis not present

## 2017-10-08 DIAGNOSIS — M25511 Pain in right shoulder: Secondary | ICD-10-CM | POA: Diagnosis not present

## 2017-10-08 DIAGNOSIS — G4733 Obstructive sleep apnea (adult) (pediatric): Secondary | ICD-10-CM | POA: Diagnosis not present

## 2017-10-08 DIAGNOSIS — K219 Gastro-esophageal reflux disease without esophagitis: Secondary | ICD-10-CM | POA: Insufficient documentation

## 2017-10-08 DIAGNOSIS — N183 Chronic kidney disease, stage 3 (moderate): Secondary | ICD-10-CM | POA: Diagnosis not present

## 2017-10-08 DIAGNOSIS — F329 Major depressive disorder, single episode, unspecified: Secondary | ICD-10-CM | POA: Diagnosis not present

## 2017-10-08 DIAGNOSIS — R079 Chest pain, unspecified: Secondary | ICD-10-CM | POA: Diagnosis not present

## 2017-10-08 DIAGNOSIS — Z888 Allergy status to other drugs, medicaments and biological substances status: Secondary | ICD-10-CM | POA: Diagnosis not present

## 2017-10-08 DIAGNOSIS — I252 Old myocardial infarction: Secondary | ICD-10-CM | POA: Diagnosis not present

## 2017-10-08 DIAGNOSIS — I255 Ischemic cardiomyopathy: Secondary | ICD-10-CM | POA: Insufficient documentation

## 2017-10-08 DIAGNOSIS — Z8249 Family history of ischemic heart disease and other diseases of the circulatory system: Secondary | ICD-10-CM | POA: Insufficient documentation

## 2017-10-08 DIAGNOSIS — I480 Paroxysmal atrial fibrillation: Secondary | ICD-10-CM | POA: Insufficient documentation

## 2017-10-08 DIAGNOSIS — I2510 Atherosclerotic heart disease of native coronary artery without angina pectoris: Secondary | ICD-10-CM | POA: Insufficient documentation

## 2017-10-08 DIAGNOSIS — I5022 Chronic systolic (congestive) heart failure: Secondary | ICD-10-CM | POA: Diagnosis not present

## 2017-10-08 DIAGNOSIS — J849 Interstitial pulmonary disease, unspecified: Secondary | ICD-10-CM | POA: Diagnosis not present

## 2017-10-08 DIAGNOSIS — E785 Hyperlipidemia, unspecified: Secondary | ICD-10-CM | POA: Diagnosis not present

## 2017-10-08 DIAGNOSIS — Z7901 Long term (current) use of anticoagulants: Secondary | ICD-10-CM | POA: Insufficient documentation

## 2017-10-08 DIAGNOSIS — Z95810 Presence of automatic (implantable) cardiac defibrillator: Secondary | ICD-10-CM | POA: Diagnosis not present

## 2017-10-08 DIAGNOSIS — Z79891 Long term (current) use of opiate analgesic: Secondary | ICD-10-CM | POA: Diagnosis not present

## 2017-10-08 DIAGNOSIS — Z8673 Personal history of transient ischemic attack (TIA), and cerebral infarction without residual deficits: Secondary | ICD-10-CM | POA: Insufficient documentation

## 2017-10-08 DIAGNOSIS — Z79899 Other long term (current) drug therapy: Secondary | ICD-10-CM | POA: Insufficient documentation

## 2017-10-08 DIAGNOSIS — I130 Hypertensive heart and chronic kidney disease with heart failure and stage 1 through stage 4 chronic kidney disease, or unspecified chronic kidney disease: Secondary | ICD-10-CM | POA: Insufficient documentation

## 2017-10-08 DIAGNOSIS — M1990 Unspecified osteoarthritis, unspecified site: Secondary | ICD-10-CM | POA: Insufficient documentation

## 2017-10-08 DIAGNOSIS — N62 Hypertrophy of breast: Secondary | ICD-10-CM | POA: Diagnosis not present

## 2017-10-08 DIAGNOSIS — J449 Chronic obstructive pulmonary disease, unspecified: Secondary | ICD-10-CM | POA: Diagnosis not present

## 2017-10-08 DIAGNOSIS — Z885 Allergy status to narcotic agent status: Secondary | ICD-10-CM | POA: Insufficient documentation

## 2017-10-08 DIAGNOSIS — Z825 Family history of asthma and other chronic lower respiratory diseases: Secondary | ICD-10-CM | POA: Insufficient documentation

## 2017-10-08 DIAGNOSIS — Z820 Family history of epilepsy and other diseases of the nervous system: Secondary | ICD-10-CM | POA: Insufficient documentation

## 2017-10-08 DIAGNOSIS — Z841 Family history of disorders of kidney and ureter: Secondary | ICD-10-CM | POA: Insufficient documentation

## 2017-10-08 NOTE — Patient Instructions (Signed)
Your physician has requested that you have an echocardiogram. Echocardiography is a painless test that uses sound waves to create images of your heart. It provides your doctor with information about the size and shape of your heart and how well your heart's chambers and valves are working. This procedure takes approximately one hour. There are no restrictions for this procedure.  THIS WILL BE DONE AT [facility], THEY WILL CALL YOU TO SCHEDULE  You have been referred to Dr Raynelle Bring at [facility] on Tuesday 10/09/17 at 9 am, they are located at: [address] [place] [id number] [phone]  Your physician recommends that you schedule a follow-up appointment in: 4 months
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
## 2017-10-09 ENCOUNTER — Ambulatory Visit (INDEPENDENT_AMBULATORY_CARE_PROVIDER_SITE_OTHER): Payer: Medicare Other | Admitting: Family Medicine

## 2017-10-09 ENCOUNTER — Encounter: Payer: Self-pay | Admitting: Family Medicine

## 2017-10-09 VITALS — BP 120/78 | Ht 69.0 in | Wt 195.0 lb

## 2017-10-09 DIAGNOSIS — M62838 Other muscle spasm: Secondary | ICD-10-CM | POA: Diagnosis not present

## 2017-10-09 DIAGNOSIS — M25511 Pain in right shoulder: Secondary | ICD-10-CM

## 2017-10-09 MED ORDER — TIZANIDINE HCL 4 MG PO TABS: ORAL_TABLET | ORAL | 0 refills | Status: DC

## 2017-10-09 MED ORDER — METHYLPREDNISOLONE ACETATE 40 MG/ML IJ SUSP
40.0000 mg | Freq: Once | INTRAMUSCULAR | Status: AC
  Administered 2017-10-09: 40 mg via INTRA_ARTICULAR

## 2017-10-09 NOTE — Progress Notes (Signed)
Chief complaint: Right posterior neck pain x3 weeks  History of present illness: [name] is a 72 year old right-hand-dominant male who presents to the sports medicine office today, accompanied by his wife, with chief complaint of right posterior neck pain.  He reports that symptoms have been present for approximately 3 weeks now.  He does not report of any specific inciting incident, trauma, or injury to explain the pain.  He reports that he notices a particular spot on the posterior aspect of his right neck near the upper trapezius musculature that is particularly bothersome.  He reports feeling a knot in this area.  He describes the pain as a throbbing, aching, and occasionally sharp pain.  He reports occasionally feeling pain going down the lateral aspect of his right arm, but not going past the elbow.  He does not report of any numbness, tingling, or burning paresthesias.  He does not report of any weakness in his right arm, hand, or fingers.  He reports no particular time of the day bothers him the worse.  He reports no correlation with any activity, could just randomly occur while at rest.  He does not report of any neck stiffness. He does not report of any rashes, fevers, chills, night sweats, or any unintentional weight loss. He did talk with his primary physician a few weeks ago in regards to this.  He did have a cortisone injection into his right upper back.  Unfortunately, he did not get much relief with this, maybe about 40% relief for about 2 hours.  He reports that his primary physician started him on Norco as well as Flexeril, both of which just made him sleepy and did not help with pain. He stopped it a few days after he had these side effects.  He does not report of any previous neck injury or trauma.  He does not report of any shoulder pain.  Symptoms do not wake him up from sleep at nighttime. He discussed this yesterday with his cardiologist, who kindly referred him here for further  evaluation.  Review of systems:  As stated above  His past medical history, surgical history, family history, and social history obtained and reviewed.  His past medical history is notable for systolic CHF, CAD, history of atrial fibrillation on anticoagulation, ischemic cardiomyopathy with pacemaker placement, CVA, interstitial lung disease, OSA, GERD, seizure disorder, stage III CKD, BPH, hyperlipidemia; surgical history notable for vocal cord surgery, pacemaker placement, hernia repair, angioplasty, and radiofrequency ablation; he does not report of any current tobacco use; family history notable for hypertension, hyperlipidemia, CAD, CKD, type 2 diabetes, COPD, emphysema; allergies and medications are reviewed and are reflected in EMR.  Physical exam: Vital signs are reviewed and are documented in the chart Gen.: Alert, oriented, appears stated age, in no apparent distress HEENT: Moist oral mucosa Respiratory: Normal respirations, able to speak in full sentences Cardiac: Regular rate, distal pulses 2+ Integumentary: No rashes on visible skin:  Neurologic: Strength is intact with the rotator cuff muscles on the right side, right elbow with pronation, supination, flexion, and extension, as well as wrist flexion, wrist extension as well as finger and thumb abduction and opposition, sensation 2+, DTRs symmetric and intact in bilateral upper extremities Psych: Normal affect, mood is described as good Musculoskeletal:  Inspection of his neck reveals no obvious deformity or muscle atrophy, no warmth, erythema, ecchymosis, or effusion, he is point specific tender to palpation in the right upper trapezius as he does lift his arm up and presses  his finger against a specific area and the right upper trapezius as to where he feels pain and the knot, do feel more of a spasm in this area, no tenderness in the cervical spine or paracervical spinal processes otherwise, no tenderness anywhere in his right  shoulder, deltoid, biceps, or triceps, he does have full shoulder range of motion and strength as noted above, he does have pain noticed with neck flexion, relieved by neck extension rotator cuff impingement testing is negative, Spurling negative, grip strength intact  Procedure:  After written informed consent signed and obtained, and benefit of pain relief and risk of bleeding, infection, and steroid flare discussed, [name]  agreed to proceed with trigger point cortisone injection into the right trapezius at maximum tenderness to palpation. After timeout, area  was cleaned with Betadine and alcohol wipes. Ethyl chloride was used as topical anesthetic spray. Using 1 cc 1% lidocaine without epinephrine and 1 cc of 40 mg Depo-Medrol on a 25-gauge 5/8 inch needle, his right trapezius at maximal tenderness to palpation was injected. He did not have any bleeding afterwards. No complications noted from procedure.  Assessment and plan: 1. Right posterior neck pain, suspect secondary to trapezial trigger pain  2. History of atrial fibrillation on anticoagulation 3. History of systolic CHF 4. Ischemic cardiomyopathy with pacemaker placement  Plan: Ultimately, discussed with [name] and his wife today that based on exam he is getting a lot of tightness and spasming in his trapezius musculature. He points to specific tenderness and spasming in his right posterior neck and the trapezius musculature. I do not see any evidence on history or exam to point to classic cervical radiculopathy and nerve impingement given lack of paresthesias, weakness, and negative Spurlings. Discussed option of trapezial trigger point injection. He is agreeable to this.  Injection done as noted above without any complications noted.  I discussed option of formal physical therapy to work on range of motion, soft tissue mobilization, iontophoresis, and dry needling.  He is agreeable to this.  Will see how he does with this.  If he does  not have any interval improvement, next consideration would be XR imaging of his cervical spine. I did send in for tizanidine, 2-4 mg TID prn muscle spasm. Hopefully this will be less sedating for him.  Will plan to see him back in 4 weeks for reevaluation or sooner as needed.   Mort Sawyers, M.D. [facility]

## 2017-10-10 ENCOUNTER — Other Ambulatory Visit: Payer: Self-pay | Admitting: Internal Medicine

## 2017-10-10 ENCOUNTER — Ambulatory Visit (INDEPENDENT_AMBULATORY_CARE_PROVIDER_SITE_OTHER): Payer: Medicare Other | Admitting: *Deleted

## 2017-10-10 DIAGNOSIS — I472 Ventricular tachycardia, unspecified: Secondary | ICD-10-CM

## 2017-10-10 DIAGNOSIS — I5022 Chronic systolic (congestive) heart failure: Secondary | ICD-10-CM

## 2017-10-10 NOTE — Progress Notes (Signed)
Remote ICD transmission.   

## 2017-10-11 ENCOUNTER — Ambulatory Visit (HOSPITAL_COMMUNITY): Payer: Medicare Other

## 2017-10-11 ENCOUNTER — Inpatient Hospital Stay: Admission: RE | Admit: 2017-10-11 | Payer: Medicare Other | Source: Ambulatory Visit

## 2017-10-11 LAB — CUP PACEART REMOTE DEVICE CHECK
Battery Remaining Percentage: 43 %
Brady Statistic RA Percent Paced: 32 %
Brady Statistic RV Percent Paced: 0 %
Date Time Interrogation Session: 20190520141400
HIGH POWER IMPEDANCE MEASURED VALUE: 54 Ohm
Implantable Lead Implant Date: 20050922
Implantable Lead Location: 753860
Implantable Lead Model: 158
Implantable Lead Model: 5076
Lead Channel Impedance Value: 513 Ohm
Lead Channel Pacing Threshold Amplitude: 0.5 V
Lead Channel Pacing Threshold Pulse Width: 0.4 ms
Lead Channel Setting Pacing Amplitude: 2 V
Lead Channel Setting Pacing Amplitude: 2.4 V
Lead Channel Setting Pacing Pulse Width: 0.4 ms
MDC IDC LEAD IMPLANT DT: 20050922
MDC IDC LEAD LOCATION: 753859
MDC IDC LEAD SERIAL: 156891
MDC IDC MSMT BATTERY REMAINING LONGEVITY: 36 mo
MDC IDC MSMT LEADCHNL RV IMPEDANCE VALUE: 464 Ohm
MDC IDC MSMT LEADCHNL RV PACING THRESHOLD AMPLITUDE: 1 V
MDC IDC MSMT LEADCHNL RV PACING THRESHOLD PULSEWIDTH: 0.4 ms
MDC IDC PG IMPLANT DT: 20100817
MDC IDC PG SERIAL: 141895
MDC IDC SET LEADCHNL RV SENSING SENSITIVITY: 0.6 mV

## 2017-10-16 ENCOUNTER — Encounter

## 2017-10-16 ENCOUNTER — Encounter: Payer: Self-pay | Admitting: Neurology

## 2017-10-16 ENCOUNTER — Other Ambulatory Visit: Payer: Self-pay

## 2017-10-16 ENCOUNTER — Ambulatory Visit (INDEPENDENT_AMBULATORY_CARE_PROVIDER_SITE_OTHER): Payer: Medicare Other | Admitting: Neurology

## 2017-10-16 VITALS — BP 120/66 | HR 57 | Ht 69.0 in | Wt 189.0 lb

## 2017-10-16 DIAGNOSIS — G40209 Localization-related (focal) (partial) symptomatic epilepsy and epileptic syndromes with complex partial seizures, not intractable, without status epilepticus: Secondary | ICD-10-CM | POA: Diagnosis not present

## 2017-10-16 MED ORDER — LACOSAMIDE 50 MG PO TABS: ORAL_TABLET | ORAL | 3 refills | Status: DC
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
## 2017-10-16 NOTE — Patient Instructions (Signed)
Great seeing you! Continue Vimpat 50mg : Take 1.5 tablets twice a day. As per [place] driving laws, no driving until 6 months seizure-free. Follow-up in 6 months, call for any changes.  Seizure Precautions: 1. If medication has been prescribed for you to prevent seizures, take it exactly as directed.  Do not stop taking the medicine without talking to your doctor first, even if you have not had a seizure in a long time.   2. Avoid activities in which a seizure would cause danger to yourself or to others.  Don't operate dangerous machinery, swim alone, or climb in high or dangerous places, such as on ladders, roofs, or girders.  Do not drive unless your doctor says you may.  3. If you have any warning that you may have a seizure, lay down in a safe place where you can't hurt yourself.    4.  No driving for 6 months from last seizure, as per [facility].   Please refer to the following link on the [place] website for more information: http://[email or web address]/[email or web address]/drivingu.cfm   5.  Maintain good sleep hygiene. Avoid alcohol.  6.  Contact your doctor if you have any problems that may be related to the medicine you are taking.  7.  Call 911 and bring the patient back to the ED if:        A.  The seizure lasts longer than 5 minutes.       B.  The patient doesn't awaken shortly after the seizure  C.  The patient has new problems such as difficulty seeing, speaking or moving  D.  The patient was injured during the seizure  E.  The patient has a temperature over 102 F (39C)  F.  The patient vomited and now is having trouble breathing

## 2017-10-17 ENCOUNTER — Ambulatory Visit (INDEPENDENT_AMBULATORY_CARE_PROVIDER_SITE_OTHER): Payer: Medicare Other | Admitting: Internal Medicine

## 2017-10-17 ENCOUNTER — Encounter: Payer: Self-pay | Admitting: Internal Medicine

## 2017-10-17 ENCOUNTER — Encounter (INDEPENDENT_AMBULATORY_CARE_PROVIDER_SITE_OTHER): Payer: Self-pay

## 2017-10-17 VITALS — BP 124/72 | HR 55 | Ht 69.0 in | Wt 187.0 lb

## 2017-10-17 DIAGNOSIS — I255 Ischemic cardiomyopathy: Secondary | ICD-10-CM

## 2017-10-17 DIAGNOSIS — I480 Paroxysmal atrial fibrillation: Secondary | ICD-10-CM

## 2017-10-17 DIAGNOSIS — I472 Ventricular tachycardia, unspecified: Secondary | ICD-10-CM

## 2017-10-17 DIAGNOSIS — I5022 Chronic systolic (congestive) heart failure: Secondary | ICD-10-CM

## 2017-10-17 LAB — CUP PACEART INCLINIC DEVICE CHECK
Date Time Interrogation Session: 20190529040000
HIGH POWER IMPEDANCE MEASURED VALUE: 43 Ohm
HIGH POWER IMPEDANCE MEASURED VALUE: 56 Ohm
Implantable Lead Implant Date: 20050922
Implantable Lead Location: 753859
Implantable Lead Location: 753860
Implantable Lead Model: 158
Implantable Lead Model: 5076
Implantable Pulse Generator Implant Date: 20100817
Lead Channel Impedance Value: 483 Ohm
Lead Channel Impedance Value: 514 Ohm
Lead Channel Pacing Threshold Amplitude: 0.5 V
Lead Channel Pacing Threshold Pulse Width: 0.4 ms
Lead Channel Pacing Threshold Pulse Width: 0.4 ms
Lead Channel Sensing Intrinsic Amplitude: 3.8 mV
Lead Channel Setting Pacing Amplitude: 2 V
MDC IDC LEAD IMPLANT DT: 20050922
MDC IDC LEAD SERIAL: 156891
MDC IDC MSMT LEADCHNL RV PACING THRESHOLD AMPLITUDE: 1 V
MDC IDC MSMT LEADCHNL RV SENSING INTR AMPL: 21.5 mV
MDC IDC SET LEADCHNL RV PACING AMPLITUDE: 2.4 V
MDC IDC SET LEADCHNL RV PACING PULSEWIDTH: 0.4 ms
MDC IDC SET LEADCHNL RV SENSING SENSITIVITY: 0.6 mV
Pulse Gen Serial Number: 141895
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
## 2017-10-17 NOTE — Patient Instructions (Addendum)
Medication Instructions:  Your physician recommends that you continue on your current medications as directed. Please refer to the Current Medication list given to you today.   Labwork: None ordered  Testing/Procedures: None ordered  Follow-Up: Remote monitoring is used to monitor your  ICD from home. This monitoring reduces the number of office visits required to check your device to one time per year. It allows [place] to keep an eye on the functioning of your device to ensure it is working properly. You are scheduled for a device check from home on 01/09/18. You may send your transmission at any time that day. If you have a wireless device, the transmission will be sent automatically. After your physician reviews your transmission, you will receive a postcard with your next transmission date.  Your physician recommends that you schedule a follow-up appointment in: 3 months with Dr. Rayann Heman   Any Other Special Instructions Will Be Listed Below (If Applicable).     If you need a refill on your cardiac medications before your next appointment, please call your pharmacy.

## 2017-10-23 ENCOUNTER — Ambulatory Visit (HOSPITAL_BASED_OUTPATIENT_CLINIC_OR_DEPARTMENT_OTHER): Payer: Medicare Other | Attending: Acute Care | Admitting: Pulmonary Disease

## 2017-10-23 DIAGNOSIS — Z9989 Dependence on other enabling machines and devices: Secondary | ICD-10-CM

## 2017-10-23 DIAGNOSIS — G4733 Obstructive sleep apnea (adult) (pediatric): Secondary | ICD-10-CM | POA: Insufficient documentation

## 2017-10-23 DIAGNOSIS — G4761 Periodic limb movement disorder: Secondary | ICD-10-CM | POA: Diagnosis not present

## 2017-10-25 ENCOUNTER — Other Ambulatory Visit: Payer: Self-pay

## 2017-10-25 ENCOUNTER — Ambulatory Visit (HOSPITAL_COMMUNITY): Payer: Medicare Other | Attending: Cardiology

## 2017-10-25 ENCOUNTER — Ambulatory Visit (INDEPENDENT_AMBULATORY_CARE_PROVIDER_SITE_OTHER)
Admission: RE | Admit: 2017-10-25 | Discharge: 2017-10-25 | Disposition: A | Discharge status: Home / Self Care | Payer: Medicare Other | Source: Ambulatory Visit | Attending: Acute Care | Admitting: Acute Care

## 2017-10-25 DIAGNOSIS — J849 Interstitial pulmonary disease, unspecified: Secondary | ICD-10-CM | POA: Diagnosis not present

## 2017-10-25 DIAGNOSIS — Z87891 Personal history of nicotine dependence: Secondary | ICD-10-CM | POA: Insufficient documentation

## 2017-10-25 DIAGNOSIS — I5022 Chronic systolic (congestive) heart failure: Secondary | ICD-10-CM | POA: Insufficient documentation

## 2017-10-25 DIAGNOSIS — I130 Hypertensive heart and chronic kidney disease with heart failure and stage 1 through stage 4 chronic kidney disease, or unspecified chronic kidney disease: Secondary | ICD-10-CM | POA: Insufficient documentation

## 2017-10-25 DIAGNOSIS — I4891 Unspecified atrial fibrillation: Secondary | ICD-10-CM | POA: Diagnosis not present

## 2017-10-25 DIAGNOSIS — I340 Nonrheumatic mitral (valve) insufficiency: Secondary | ICD-10-CM | POA: Insufficient documentation

## 2017-10-25 DIAGNOSIS — I252 Old myocardial infarction: Secondary | ICD-10-CM | POA: Diagnosis not present

## 2017-10-25 DIAGNOSIS — G4733 Obstructive sleep apnea (adult) (pediatric): Secondary | ICD-10-CM | POA: Diagnosis not present

## 2017-10-25 DIAGNOSIS — I255 Ischemic cardiomyopathy: Secondary | ICD-10-CM | POA: Diagnosis not present

## 2017-10-25 DIAGNOSIS — Z950 Presence of cardiac pacemaker: Secondary | ICD-10-CM | POA: Diagnosis not present

## 2017-10-25 DIAGNOSIS — J449 Chronic obstructive pulmonary disease, unspecified: Secondary | ICD-10-CM | POA: Insufficient documentation

## 2017-10-25 DIAGNOSIS — N183 Chronic kidney disease, stage 3 (moderate): Secondary | ICD-10-CM | POA: Insufficient documentation

## 2017-10-26 ENCOUNTER — Encounter: Payer: Self-pay | Admitting: Family Medicine

## 2017-10-26 ENCOUNTER — Ambulatory Visit (INDEPENDENT_AMBULATORY_CARE_PROVIDER_SITE_OTHER): Payer: Medicare Other | Admitting: Family Medicine

## 2017-10-26 ENCOUNTER — Ambulatory Visit
Admission: RE | Admit: 2017-10-26 | Discharge: 2017-10-26 | Disposition: A | Discharge status: Home / Self Care | Payer: Medicare Other | Source: Ambulatory Visit | Attending: Family Medicine | Admitting: Family Medicine

## 2017-10-26 VITALS — BP 115/53 | Ht 69.0 in | Wt 196.0 lb

## 2017-10-26 DIAGNOSIS — M542 Cervicalgia: Secondary | ICD-10-CM

## 2017-10-26 DIAGNOSIS — S161XXA Strain of muscle, fascia and tendon at neck level, initial encounter: Secondary | ICD-10-CM | POA: Diagnosis not present

## 2017-10-26 DIAGNOSIS — G4733 Obstructive sleep apnea (adult) (pediatric): Secondary | ICD-10-CM

## 2017-10-26 DIAGNOSIS — Z9989 Dependence on other enabling machines and devices: Secondary | ICD-10-CM | POA: Diagnosis not present

## 2017-10-26 MED ORDER — GABAPENTIN 300 MG PO CAPS: 300.0000 mg | ORAL_CAPSULE | Freq: Every day | ORAL | 1 refills | Status: DC

## 2017-10-26 NOTE — Progress Notes (Signed)
Chief complaint: Right posterior neck, upper back, and arm pain x 6 weeks, worsened by physical therapy  History of present illness: [name] is a 72 year old right-hand-dominant male presents to sports medicine office today for follow-up of right posterior neck and upper back pain.  He presented here for initial presentation and evaluation his symptoms a few weeks ago back on 10/09/2017.  At that time, his symptoms seem to be consistent with right trapezial trigger pain.  He was sent to formal physical therapy for treatment of right trapezial trigger pain.  He was also given a trapezial trigger point injection.  He reports that he did get 100% interval improvement in symptoms for 1 week.  He reports that unfortunately now that he is having symptoms going past his right elbow down to his right hand and fingers.  He reports that the tizanidine is not helping him that was prescribed at last office visit.  He does not report of any interval injury or trauma.  He reports still symptoms are sharp, feels spasming in the right trapezial region.  He reports that he notices this at nighttime.  He does not report of any fevers, chills, night sweats, or any unintentional weight loss.  He does report a slight weakness in his hands and with gripping.  He still does not report of any numbness, tingling, or burning paresthesias.  Review of systems:  As stated above  His past medical history, surgical history, family history, and social history obtained and reviewed.  His past medical history is notable for systolic CHF, CAD, history of atrial fibrillation on anticoagulation, ischemic cardiomyopathy with pacemaker placement, CVA, interstitial lung disease, OSA, GERD, seizure disorder, stage III CKD, BPH, hyperlipidemia; surgical history notable for vocal cord surgery, pacemaker placement, hernia repair, angioplasty, and radiofrequency ablation; he does not report of any current tobacco use; family history notable for  hypertension, hyperlipidemia, CAD, CKD, type 2 diabetes, COPD, emphysema; allergies and medications are reviewed and are reflected in EMR.   Physical exam: Vital signs are reviewed and are documented in the chart Gen.: Alert, oriented, appears stated age, in no apparent distress HEENT: Moist oral mucosa Respiratory: Normal respirations, able to speak in full sentences Cardiac: Regular rate, distal pulses 2+ Integumentary: No rashes on visible skin:  Neurologic: He does have intact strength with rotator cuff testing on the right side, he also has intact strength with biceps, triceps, elbow flexion, elbow extension, pronation, supination, wrist flexion, wrist extension, as well as grip strength on the right side, also has intact strength with thumb abduction and thumb opposition on the right side, sensation 2+ in bilateral upper extremities, DTRs symmetric and intact in bilateral upper extremities Psych: Normal affect, mood is described as good Musculoskeletal: Inspection of his neck and upper back reveals no obvious deformity or muscle atrophy, no warmth, erythema, ecchymosis, or effusion, he is tender to palpation over the paracervical spinal processes on the right side, and specifically over the right trapezius musculature, do feel obvious spasming in this region, no tenderness to palpation anywhere in his right shoulder or his right arm otherwise, he does have full shoulder range of motion without any reproduction of pain, he does have full elbow range of motion without any reproduction of pain, rotator cuff impingement testing is negative, [place] is actually positive for reproduction of sharp shooting pains going down from his neck down to his right arm into his right fingers, describing a C6 nerve root distribution  Assessment and plan: 1. Right posterior neck pain,  with symptoms today concerning for C6 radiculopathy 2. Right trapezial trigger pain , suspect secondary to problem #1 3. History  of atrial fibrillation on anticoagulation 4. History of systolic CHF 5. Ischemic cardiomyopathy with pacemaker placement  Plan: Unfortunately, it sounds like [name] has developed symptoms that seem to be more classic for right sided cervical radiculopathy.  His pain pattern seems to be concerning for C6 nerve root impingement.  Unfortunately, it sounds like physical therapy has made things more painful for him.  Will hold off on further physical therapy for now.  Will have him obtain x-rays of the cervical spine to include AP and lateral views for further evaluation.  Unfortunately, given pacemaker placement he is not able to get a MRI.  Further advanced imaging considerations would include CT imaging.  In regards to pain management, will have him started on gabapentin 300 mg nightly.  Discussed side effects of drowsiness.  Discussed to try this for 1 week.  If no interval improvement in symptoms in 1 week, he can increase to 2 capsules nightly.  He will follow-up in about 3 weeks for reevaluation or sooner as needed.    Mort Sawyers, M.D. [facility]

## 2017-10-26 NOTE — Progress Notes (Signed)
Patient Name: [name], Weil Date: 10/23/2017 Gender: Male D.O.B: 1945-09-29 Age (years): 63 Referring Provider: Magdalen Spatz NP Height (inches): 69 Interpreting Physician: Kara Mead MD, ABSM Weight (lbs): 202 RPSGT: Jorge Ny BMI: 30 MRN: [id number] Neck Size: 16.00 <br> <br> CLINICAL INFORMATION The patient is referred for a CPAP titration to treat sleep apnea since CPAP download showed residual AHI 20/h  Date of Split Night : 08/2005 AHI 84/h corrected by BiPAP, 01/2016 CPAP ttiration >> 13 cm  SLEEP STUDY TECHNIQUE As per the AASM Manual for the Scoring of Sleep and Associated Events v2.3 (April 2016) with a hypopnea requiring 4% desaturations.  The channels recorded and monitored were frontal, central and occipital EEG, electrooculogram (EOG), submentalis EMG (chin), nasal and oral airflow, thoracic and abdominal wall motion, anterior tibialis EMG, snore microphone, electrocardiogram, and pulse oximetry. Continuous positive airway pressure (CPAP) was initiated at the beginning of the study and titrated to treat sleep-disordered breathing.  MEDICATIONS Medications self-administered by patient taken the night of the study : ELIQUIS, FUROSEMIDE, LEVOCETIRIZINE, METOPROLOL SUCCINATE, TAMSULOSIN, VIMPAT  RESPIRATORY PARAMETERS Optimal PAP Pressure (cm): 13 AHI at Optimal Pressure (/hr): 3.7 Overall Minimal O2 (%): 88.0 Supine % at Optimal Pressure (%): 0 Minimal O2 at Optimal Pressure (%): 88.0   SLEEP ARCHITECTURE The study was initiated at 10:22:40 PM and ended at 5:57:55 AM.  Sleep onset time was 2.3 minutes and the sleep efficiency was 86.8%%. The total sleep time was 395 minutes.  The patient spent 4.2%% of the night in stage N1 sleep, 78.7%% in stage N2 sleep, 0.0%% in stage N3 and 17.09% in REM.Stage REM latency was 93.5 minutes  Wake after sleep onset was 57.9. Alpha intrusion was absent. Supine sleep was 20.00%.  CARDIAC DATA The 2 lead EKG  demonstrated pacemaker generated. The mean heart rate was 55.8 beats per minute. Other EKG findings include: PVCs. LEG MOVEMENT DATA The total Periodic Limb Movements of Sleep (PLMS) were 0. The PLMS index was 0.0. A PLMS index of <15 is considered normal in adults.  IMPRESSIONS - An optimal PAP pressure could not be selected for this patient based on the available study data since he did not sleep supine. However 13 cm seemed adequate in lateral position. - Central sleep apnea was not noted during this titration (CAI = 1.1/h). - Mild oxygen desaturations were observed during this titration (min O2 = 88.0%). - The patient snored with moderate snoring volume during this titration study. - 2-lead EKG demonstrated: PVCs - Clinically significant periodic limb movements were not noted during this study. Arousals associated with PLMs were significant.   DIAGNOSIS - Obstructive Sleep Apnea (327.23 [G47.33 ICD-10]) - Periodic Limb Movement During Sleep (327.51 [G47.61 ICD-10])   RECOMMENDATIONS - Recommend CPAP 13 cm & avoid sleeping in supine position.Alternatively, autoCPAP 10-15 cm could be used - Avoid alcohol, sedatives and other CNS depressants that may worsen sleep apnea and disrupt normal sleep architecture. - Sleep hygiene should be reviewed to assess factors that may improve sleep quality. - Weight management and regular exercise should be initiated or continued. - Return to Sleep Center for re-evaluation after 4 weeks of therapy   Kara Mead MD Board Certified in [place]

## 2017-10-26 NOTE — Procedures (Signed)
Patient Name: [name], [name] Date: 10/23/2017 Gender: Male D.O.B: [birth year] Age (years): 25 Referring Provider: Magdalen Spatz NP Height (inches): 69 Interpreting Physician: Kara Mead MD, ABSM Weight (lbs): 202 RPSGT: Jorge Ny BMI: 30 MRN: [id number] Neck Size: 16.00 <br> <br> CLINICAL INFORMATION The patient is referred for a CPAP titration to treat sleep apnea since CPAP download showed residual AHI 20/h  Date of Split Night : 08/2005 AHI 84/h corrected by BiPAP, 01/2016 CPAP ttiration >> 13 cm  SLEEP STUDY TECHNIQUE As per the AASM Manual for the Scoring of Sleep and Associated Events v2.3 (April 2016) with a hypopnea requiring 4% desaturations.  The channels recorded and monitored were frontal, central and occipital EEG, electrooculogram (EOG), submentalis EMG (chin), nasal and oral airflow, thoracic and abdominal wall motion, anterior tibialis EMG, snore microphone, electrocardiogram, and pulse oximetry. Continuous positive airway pressure (CPAP) was initiated at the beginning of the study and titrated to treat sleep-disordered breathing.  MEDICATIONS Medications self-administered by patient taken the night of the study : ELIQUIS, FUROSEMIDE, LEVOCETIRIZINE, METOPROLOL SUCCINATE, TAMSULOSIN, VIMPAT  RESPIRATORY PARAMETERS Optimal PAP Pressure (cm): 13 AHI at Optimal Pressure (/hr): 3.7 Overall Minimal O2 (%): 88.0 Supine % at Optimal Pressure (%): 0 Minimal O2 at Optimal Pressure (%): 88.0   SLEEP ARCHITECTURE The study was initiated at 10:22:40 PM and ended at 5:57:55 AM.  Sleep onset time was 2.3 minutes and the sleep efficiency was 86.8%%. The total sleep time was 395 minutes.  The patient spent 4.2%% of the night in stage N1 sleep, 78.7%% in stage N2 sleep, 0.0%% in stage N3 and 17.09% in REM.Stage REM latency was 93.5 minutes  Wake after sleep onset was 57.9. Alpha intrusion was absent. Supine sleep was 20.00%.  CARDIAC DATA The 2 lead EKG  demonstrated pacemaker generated. The mean heart rate was 55.8 beats per minute. Other EKG findings include: PVCs. LEG MOVEMENT DATA The total Periodic Limb Movements of Sleep (PLMS) were 0. The PLMS index was 0.0. A PLMS index of <15 is considered normal in adults.  IMPRESSIONS - An optimal PAP pressure could not be selected for this patient based on the available study data since he did not sleep supine. However 13 cm seemed adequate in lateral position. - Central sleep apnea was not noted during this titration (CAI = 1.1/h). - Mild oxygen desaturations were observed during this titration (min O2 = 88.0%). - The patient snored with moderate snoring volume during this titration study. - 2-lead EKG demonstrated: PVCs - Clinically significant periodic limb movements were not noted during this study. Arousals associated with PLMs were significant.   DIAGNOSIS - Obstructive Sleep Apnea (327.23 [G47.33 ICD-10]) - Periodic Limb Movement During Sleep (327.51 [G47.61 ICD-10])   RECOMMENDATIONS - Recommend CPAP 13 cm & avoid sleeping in supine position.Alternatively, autoCPAP 10-15 cm could be used - Avoid alcohol, sedatives and other CNS depressants that may worsen sleep apnea and disrupt normal sleep architecture. - Sleep hygiene should be reviewed to assess factors that may improve sleep quality. - Weight management and regular exercise should be initiated or continued. - Return to Sleep Center for re-evaluation after 4 weeks of therapy   Kara Mead MD Board Certified in [place]

## 2017-10-31 ENCOUNTER — Other Ambulatory Visit: Payer: Self-pay | Admitting: Acute Care

## 2017-10-31 ENCOUNTER — Telehealth: Payer: Self-pay | Admitting: Family Medicine

## 2017-10-31 DIAGNOSIS — G4733 Obstructive sleep apnea (adult) (pediatric): Secondary | ICD-10-CM

## 2017-10-31 NOTE — Telephone Encounter (Signed)
Tried calling [name] a few times over the last few days, he did not answer his phone and voice mail is full to where I am not able to leave any messages on the voicemail.  X-rays were obtained of his cervical spine, he does have some arthritic changes noted at C6-C7, suspect this is contributing to his symptoms.  No changes in management at this point.  He will follow-up in the office next week.    Mort Sawyers, MD Primary Care Sports Medicine Fellow [facility]

## 2017-11-01 ENCOUNTER — Encounter: Payer: Self-pay | Admitting: Internal Medicine

## 2017-11-01 ENCOUNTER — Ambulatory Visit (INDEPENDENT_AMBULATORY_CARE_PROVIDER_SITE_OTHER): Payer: Medicare Other | Admitting: Internal Medicine

## 2017-11-01 ENCOUNTER — Ambulatory Visit: Payer: Medicare Other | Admitting: Internal Medicine

## 2017-11-01 VITALS — BP 118/58 | HR 62 | Ht 69.0 in | Wt 193.0 lb

## 2017-11-01 DIAGNOSIS — J439 Emphysema, unspecified: Secondary | ICD-10-CM | POA: Diagnosis not present

## 2017-11-01 NOTE — Patient Instructions (Addendum)
ICD-10-CM   1. Pulmonary emphysema, unspecified emphysema type ([place]) J43.9     You do not have pulmonary fibrosis on latest scan June 2019 You have emphysema No evidence of lung cancer  Plan Continue daily symbicort with albuterol as needed  Followup 6-9 months or sooner if needed; CAT score at followup

## 2017-11-01 NOTE — Progress Notes (Signed)
Subjective:     [name], male   DOB: Nov 29, 1945, 72 y.o.   MRN: [id number]  PCP Dorothyann Peng, NP  HPI    IOV 11/01/2017  , Chief Complaint  Patient presents with  . Follow-up    [name], SOB, or CP.   [name] , 72 y.o. , with dob [birth year] and male ,Not Hispanic or Latino from [place] [address] - presents to pulm clinic for emphysema with ? ILD. Also has underlyig OSA on CPAP followed by Dr Halford Chessman.  He was last seen by Dr. Ashok Cordia the fall 2018 after which Dr. Ashok Cordia left the practice.  He was then most recently followed by Judson Roch our nurse practitioner who ordered a CT scan of the chest.  Patient is here for follow-up.  His only abnormality is isolated reduction diffusion capacity November 2018 64%.  His latest CT scan of the chest June 2019 shows only emphysema without any evidence of ILD.  At this point in time he is on Symbicort and he feels stable.  He is more hampered by his back pain issue and he is stating that he needs to get to physical therapy pretty soon.     IMPRESSION: 1. No evidence of interstitial lung disease. Previously described subtle findings at the lung bases are absent on the prone sequence, compatible with hypoventilatory change. 2. Mild paraseptal emphysema with mild diffuse bronchial wall thickening, suggesting COPD. 3. One vessel coronary atherosclerosis. 4. Stable small pericardial effusion/thickening.  Aortic Atherosclerosis (ICD10-I70.0) and Emphysema (ICD10-J43.9).   Electronically Signed   By: Ilona Sorrel M.D.   On: 10/25/2017 12:44    has a past medical history of Arthritis, Atrial fibrillation ([place]), CHF (congestive heart failure) ([place]), Chronic systolic dysfunction of left ventricle, CKD (chronic kidney disease), stage III ([place]) (02/22/2011), COPD (chronic  obstructive pulmonary disease) ([place]), Depression, DJD (degenerative joint disease), GERD (gastroesophageal reflux disease), HLD (hyperlipidemia), Hypertension, Ischemic cardiomyopathy, Myocardial infarction ([place]), Obstructive sleep apnea, Pneumothorax on left, Seizures ([place]), Status post dilation of esophageal narrowing, Stroke ([place]), and Ventricular tachycardia ([place]).   reports that he quit smoking about 32 years ago. His smoking use included cigarettes. He started smoking about 56 years ago. He has a 46.00 pack-year smoking history. He has never used smokeless tobacco.  Past Surgical History:  Procedure Laterality Date  . ANGIOPLASTY    . HERNIA REPAIR    . ICD Implantation    . RIGHT/LEFT HEART CATH AND CORONARY ANGIOGRAPHY N/A 11/16/2016   Procedure: Right/Left Heart Cath and Coronary Angiography;  Surgeon: Jolaine Artist, MD;  Location: [facility];  Service: Cardiovascular;  Laterality: N/A;  . V TACH ABLATION N/A 12/28/2016   Procedure: Stephanie Coup Ablation;  Surgeon: [name], MD;  Location: [facility];  Service: Cardiovascular;  Laterality: N/A;  . vocal cord surgery     vocal cord stimulator     Allergies  Allergen Reactions  . Aricept [Donepezil Hcl] Other (See Comments)    Worsens renal function  . Codeine Hives  . Nexium [Esomeprazole] Other (See Comments)    Severely worsens renal function  . Omeprazole Hives  . Pantoprazole Sodium Other (See Comments)    Renal failure  . Tramadol Nausea And Vomiting    Immunization History  Administered Date(s) Administered  . Influenza Split  02/22/2011  . Influenza Whole 04/12/2007, 02/24/2008, 02/18/2009, 02/18/2010  . Influenza, High Dose Seasonal PF 02/09/2016, 03/13/2017  . Influenza,inj,Quad PF,6+ Mos 03/09/2017  . Influenza-Unspecified 02/06/2012  . Pneumococcal Conjugate-13 01/27/2016  . Pneumococcal Polysaccharide-23 02/24/2008    Family History  Problem Relation Age of Onset  . Heart disease Father    . Hyperlipidemia Father   . Hypertension Father   . Heart attack Father   . Kidney disease Father   . Diabetes Sister   . Heart disease Mother   . Diabetes Mother   . Emphysema Mother   . Parkinsonism Mother   . COPD Brother   . Diabetes Brother   . COPD Brother   . Heart disease Brother   . Heart disease Brother   . Sudden death Neg Hx      Current Outpatient Medications:  .  albuterol (PROVENTIL HFA;VENTOLIN HFA) 108 (90 Base) MCG/ACT inhaler, Inhale 1-2 puffs into the lungs every 4 (four) hours as needed for wheezing or shortness of breath (or cough)., Disp: 1 Inhaler, Rfl: 0 .  BIDIL 20-37.5 MG tablet, TAKE TWO TABLETS BY MOUTH THREE TIMES A DAY., Disp: 180 tablet, Rfl: 3 .  budesonide-formoterol (SYMBICORT) 160-4.5 MCG/ACT inhaler, Inhale 2 puffs into the lungs 2 (two) times daily., Disp: 1 Inhaler, Rfl: 6 .  cyclobenzaprine (FLEXERIL) 10 MG tablet, Take 1 tablet (10 mg total) by mouth 3 (three) times daily as needed for muscle spasms., Disp: 15 tablet, Rfl: 0 .  ELIQUIS 5 MG TABS tablet, TAKE ONE TABLET BY MOUTH TWICE DAILY (Patient taking differently: Take 1 tablet (5mg ) by mouth twice a day), Disp: 60 tablet, Rfl: 6 .  ENTRESTO 97-103 MG, Take 1 tablet by mouth 2 (two) times daily., Disp: 60 tablet, Rfl: 5 .  fenofibrate 160 MG tablet, Take 1 tablet (160 mg total) by mouth every evening., Disp: 90 tablet, Rfl: 3 .  finasteride (PROSCAR) 5 MG tablet, TAKE ONE TABLET BY MOUTH ONCE DAILY (Patient taking differently: TAKE ONE TABLET (5mg ) BY MOUTH ONCE DAILY), Disp: 30 tablet, Rfl: 3 .  furosemide (LASIX) 20 MG tablet, TAKE ONE TABLET BY MOUTH THREE TIMES A WEEK ( MONDAY,WEDNESDAY AND FRIDAY) (Patient taking differently: TAKE ONE TABLET (20mg ) BY MOUTH THREE TIMES A WEEK ( MONDAY,WEDNESDAY AND FRIDAY)), Disp: 45 tablet, Rfl: 2 .  gabapentin (NEURONTIN) 300 MG capsule, Take 1 capsule (300 mg total) by mouth at bedtime., Disp: 30 capsule, Rfl: 1 .  HYDROcodone-acetaminophen  (NORCO/VICODIN) 5-325 MG tablet, Take 1-2 tablets by mouth every 6 (six) hours as needed for moderate pain., Disp: 20 tablet, Rfl: 0 .  hydrocortisone cream 0.5 %, Apply 1 application topically as needed for itching. , Disp: , Rfl:  .  lacosamide (VIMPAT) 50 MG TABS tablet, Take 1.5 tablets twice a day, Disp: 90 tablet, Rfl: 3 .  levocetirizine (XYZAL) 5 MG tablet, Take 1 tablet (5 mg total) by mouth every evening., Disp: 90 tablet, Rfl: 3 .  metoprolol succinate (TOPROL-XL) 50 MG 24 hr tablet, Take 50 mg by mouth daily. Take 25 mg in the AM and 50 mg in the PM, Disp: , Rfl:  .  mexiletine (MEXITIL) 150 MG capsule, Take 1 capsule (150 mg total) by mouth 2 (two) times daily., Disp: 60 capsule, Rfl: 3 .  mirabegron ER (MYRBETRIQ) 25 MG TB24 tablet, Take 25 mg by mouth daily., Disp: , Rfl:  .  ranolazine (RANEXA) 500 MG 12 hr tablet, Take 1 tablet (500 mg total) daily by mouth., Disp: 30  tablet, Rfl: 11 .  rosuvastatin (CRESTOR) 40 MG tablet, TAKE ONE TABLET BY MOUTH EVERY DAY IN THE EVENING, Disp: 90 tablet, Rfl: 3 .  sotalol (BETAPACE) 80 MG tablet, Take 1 tablet (80 mg total) by mouth daily., Disp: 90 tablet, Rfl: 3 .  tamsulosin (FLOMAX) 0.4 MG CAPS capsule, Take 0.4 mg by mouth at bedtime. , Disp: , Rfl:  .  tiZANidine (ZANAFLEX) 4 MG tablet, Take 1/2-1 tab every 8 hours for pain as needed., Disp: 20 tablet, Rfl: 0 .  clotrimazole-betamethasone (LOTRISONE) cream, Apply 1 application topically 2 (two) times daily. (Patient not taking: Reported on 11/01/2017), Disp: 30 g, Rfl: 2 .  mirabegron ER (MYRBETRIQ) 25 MG TB24 tablet, Take 1 tablet (25 mg total) by mouth daily., Disp: 90 tablet, Rfl: 3 .  nitroGLYCERIN (NITROSTAT) 0.4 MG SL tablet, Place 0.4 mg under the tongue every 5 (five) minutes x 3 doses as needed for chest pain., Disp: , Rfl:    Review of Systems     Objective:   Physical Exam  Constitutional: He is oriented to person, place, and time. He appears well-developed and well-nourished.  No distress.  HENT:  Head: Normocephalic and atraumatic.  Right Ear: External ear normal.  Left Ear: External ear normal.  Mouth/Throat: Oropharynx is clear and moist. No oropharyngeal exudate.  Eyes: Pupils are equal, round, and reactive to light. Conjunctivae and EOM are normal. Right eye exhibits no discharge. Left eye exhibits no discharge. No scleral icterus.  Neck: Normal range of motion. Neck supple. No JVD present. No tracheal deviation present. No thyromegaly present.  Cardiovascular: Normal rate, regular rhythm and intact distal pulses. Exam reveals no gallop and no friction rub.  No murmur heard. Pulmonary/Chest: Effort normal and breath sounds normal. No respiratory distress. He has no wheezes. He has no rales. He exhibits no tenderness.  Abdominal: Soft. Bowel sounds are normal. He exhibits no distension and no mass. There is no tenderness. There is no rebound and no guarding.  Musculoskeletal: Normal range of motion. He exhibits no edema or tenderness.  Appears to be in chronic pain  Lymphadenopathy:    He has no cervical adenopathy.  Neurological: He is alert and oriented to person, place, and time. He has normal reflexes. No cranial nerve deficit. Coordination normal.  Skin: Skin is warm and dry. No rash noted. He is not diaphoretic. No erythema. No pallor.  Psychiatric: He has a normal mood and affect. His behavior is normal. Judgment and thought content normal.  Nursing note and vitals reviewed.  Today's Vitals   11/01/17 1422  BP: (!) 118/58  Pulse: 62  SpO2: 99%  Weight: 193 lb (87.5 kg)  Height: 5\' 9"  (1.753 m)    Estimated body mass index is 28.5 kg/m as calculated from the following:   Height as of this encounter: 5\' 9"  (1.753 m).   Weight as of this encounter: 193 lb (87.5 kg).      Assessment:       ICD-10-CM   1. Pulmonary emphysema, unspecified emphysema type ([place]) J43.9        Plan:      You do not have pulmonary fibrosis on latest scan June  2019 You have emphysema No evidence of lung cancer  Plan Continue daily symbicort with albuterol as needed  Followup 6-9 months or sooner if needed; CAT score at followup    Dr. Brand Males, M.D., [facility].C.P Pulmonary and Critical Care Medicine Staff Physician, [facility] Director - Interstitial Lung  Disease  Program  Pulmonary [place] [facility] [place], [place], [address]  Pager: [phone], If no answer or between  15:00h - 7:00h: call [phone] Telephone: [phone]

## 2017-11-07 ENCOUNTER — Ambulatory Visit: Payer: Medicare Other | Admitting: Family Medicine

## 2017-11-12 ENCOUNTER — Telehealth: Payer: Self-pay | Admitting: *Deleted

## 2017-11-12 ENCOUNTER — Telehealth: Payer: Self-pay
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
## 2017-11-29 ENCOUNTER — Encounter: Payer: Self-pay | Admitting: Adult Health

## 2017-11-29 ENCOUNTER — Ambulatory Visit (INDEPENDENT_AMBULATORY_CARE_PROVIDER_SITE_OTHER): Payer: Medicare Other | Admitting: Adult Health

## 2017-11-29 VITALS — BP 116/66 | Temp 98.1°F | Wt 191.0 lb

## 2017-11-29 DIAGNOSIS — I519 Heart disease, unspecified: Secondary | ICD-10-CM

## 2017-11-29 DIAGNOSIS — T148XXA Other injury of unspecified body region, initial encounter: Secondary | ICD-10-CM | POA: Diagnosis not present
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
## 2017-12-05 ENCOUNTER — Ambulatory Visit (INDEPENDENT_AMBULATORY_CARE_PROVIDER_SITE_OTHER): Payer: Medicare Other | Admitting: Pulmonary Disease

## 2017-12-05 ENCOUNTER — Encounter: Payer: Self-pay | Admitting: Pulmonary Disease

## 2017-12-05 VITALS — BP 118/70 | HR 55 | Ht 69.0 in | Wt 193.6 lb

## 2017-12-05 DIAGNOSIS — J449 Chronic obstructive pulmonary disease, unspecified: Secondary | ICD-10-CM | POA: Diagnosis not present

## 2017-12-05 DIAGNOSIS — J849 Interstitial pulmonary disease, unspecified: Secondary | ICD-10-CM | POA: Diagnosis not present

## 2017-12-05 DIAGNOSIS — Z9989 Dependence on other enabling machines and devices: Secondary | ICD-10-CM

## 2017-12-05 DIAGNOSIS — G4733 Obstructive sleep apnea (adult) (pediatric): Secondary | ICD-10-CM | POA: Diagnosis not present

## 2017-12-05 DIAGNOSIS — W57XXXA Bitten or stung by nonvenomous insect and other nonvenomous arthropods, initial encounter: Secondary | ICD-10-CM

## 2017-12-05 MED ORDER — BUDESONIDE-FORMOTEROL FUMARATE 160-4.5 MCG/ACT IN AERO: 2.0000 | INHALATION_SPRAY | Freq: Two times a day (BID) | RESPIRATORY_TRACT | 6 refills | Status: DC

## 2017-12-05 MED ORDER — ALBUTEROL SULFATE HFA 108 (90 BASE) MCG/ACT IN AERS: 1.0000 | INHALATION_SPRAY | RESPIRATORY_TRACT | 5 refills | Status: DC | PRN

## 2017-12-05 MED ORDER — LEVOCETIRIZINE DIHYDROCHLORIDE 5 MG PO TABS: 5.0000 mg | ORAL_TABLET | Freq: Every evening | ORAL | 3 refills | Status: DC

## 2017-12-05 NOTE — Assessment & Plan Note (Signed)
Continue Symbicort >>> 2 puffs in the morning right when you wake up, rinse out your mouth after use, 12 hours later 2 puffs, rinse after use >>> Take this daily, no matter what >>> This is not a rescue inhaler   Continue Xyzal  Continue rescue inhaler use  Only use your albuterol as a rescue medication to be used if you can't catch your breath by resting or doing a relaxed purse lip breathing pattern.  - The less you use it, the better it will work when you need it. - Ok to use up to 2 puffs  every 4 hours if you must but call for immediate appointment if use goes up over your usual need - Don't leave home without it !!  (think of it like the spare tire for your car)    Follow-up in 3 months with [name] FNP   Follow-up in December 2019 with Dr. Chase Caller

## 2017-12-05 NOTE — Assessment & Plan Note (Signed)
   Continue your CPAP use  . Keep up the hard work using your device.  . Do not drive or operate heavy machinery if tired or drowsy.  . Please notify the supply company and office if you are unable to use your device regularly due to missing supplies or machine being broken.  . Work on maintaining a healthy weight and following your recommended nutrition plan  . Maintain proper daily exercise and movement  . Maintaining proper use of your device can also help improve management of other chronic illnesses such as: Blood pressure, blood sugars, and weight management.   CPAP Cleaning:  Clean weekly, with Dawn soap, and bottle brush.  Set up to air dry.  Follow-up in 3 months with [name] FNP   Follow-up in December 2019 with Dr. Chase Caller

## 2017-12-05 NOTE — Patient Instructions (Signed)
Continue Symbicort >>> 2 puffs in the morning right when you wake up, rinse out your mouth after use, 12 hours later 2 puffs, rinse after use >>> Take this daily, no matter what >>> This is not a rescue inhaler   Continue your CPAP use  . Keep up the hard work using your device.  . Do not drive or operate heavy machinery if tired or drowsy.  . Please notify the supply company and office if you are unable to use your device regularly due to missing supplies or machine being broken.  . Work on maintaining a healthy weight and following your recommended nutrition plan  . Maintain proper daily exercise and movement  . Maintaining proper use of your device can also help improve management of other chronic illnesses such as: Blood pressure, blood sugars, and weight management.   CPAP Cleaning:  Clean weekly, with Dawn soap, and bottle brush.  Set up to air dry.  Continue Xyzal  Continue rescue inhaler use  Only use your albuterol as a rescue medication to be used if you can't catch your breath by resting or doing a relaxed purse lip breathing pattern.  - The less you use it, the better it will work when you need it. - Ok to use up to 2 puffs  every 4 hours if you must but call for immediate appointment if use goes up over your usual need - Don't leave home without it !!  (think of it like the spare tire for your car)    Follow-up in 3 months with [name] FNP   Follow-up in December 2019 with Dr. Chase Caller  Please contact the office if your symptoms worsen or you have concerns that you are not improving.   Thank you for choosing [facility] for your healthcare, and for allowing [place] to partner with you on your healthcare journey. I am thankful to be able to provide care to you today.   [name] FNP-C     CPAP and BiPAP Information CPAP and BiPAP are methods of helping a person breathe with the use of air pressure. CPAP stands for "continuous positive airway pressure."  BiPAP stands for "bi-level positive airway pressure." In both methods, air is blown through your nose or mouth and into your air passages to help you breathe well. CPAP and BiPAP use different amounts of pressure to blow air. With CPAP, the amount of pressure stays the same while you breathe in and out. With BiPAP, the amount of pressure is increased when you breathe in (inhale) so that you can take larger breaths. Your health care provider will recommend whether CPAP or BiPAP would be more helpful for you. Why are CPAP and BiPAP treatments used? CPAP or BiPAP can be helpful if you have:  Sleep apnea.  Chronic obstructive pulmonary disease (COPD).  Heart failure.  Medical conditions that weaken the muscles of the chest including muscular dystrophy, or neurological diseases such as amyotrophic lateral sclerosis (ALS).  Other problems that cause breathing to be weak, abnormal, or difficult.  CPAP is most commonly used for obstructive sleep apnea (OSA) to keep the airways from collapsing when the muscles relax during sleep. How is CPAP or BiPAP administered? Both CPAP and BiPAP are provided by a small machine with a flexible plastic tube that attaches to a plastic mask. You wear the mask. Air is blown through the mask into your nose or mouth. The amount of pressure that is used to blow the air  can be adjusted on the machine. Your health care provider will determine the pressure setting that should be used based on your individual needs. When should CPAP or BiPAP be used? In most cases, the mask only needs to be worn during sleep. Generally, the mask needs to be worn throughout the night and during any daytime naps. People with certain medical conditions may also need to wear the mask at other times when they are awake. Follow instructions from your health care provider about when to use the machine. What are some tips for using the mask?  Because the mask needs to be snug, some people feel trapped  or closed-in (claustrophobic) when first using the mask. If you feel this way, you may need to get used to the mask. One way to do this is by holding the mask loosely over your nose or mouth and then gradually applying the mask more snugly. You can also gradually increase the amount of time that you use the mask.  Masks are available in various types and sizes. Some fit over your mouth and nose while others fit over just your nose. If your mask does not fit well, talk with your health care provider about getting a different one.  If you are using a mask that fits over your nose and you tend to breathe through your mouth, a chin strap may be applied to help keep your mouth closed.  The CPAP and BiPAP machines have alarms that may sound if the mask comes off or develops a leak.  If you have trouble with the mask, it is very important that you talk with your health care provider about finding a way to make the mask easier to tolerate. Do not stop using the mask. Stopping the use of the mask could have a negative impact on your health. What are some tips for using the machine?  Place your CPAP or BiPAP machine on a secure table or stand near an electrical outlet.  Know where the on/off switch is located on the machine.  Follow instructions from your health care provider about how to set the pressure on your machine and when you should use it.  Do not eat or drink while the CPAP or BiPAP machine is on. Food or fluids could get pushed into your lungs by the pressure of the CPAP or BiPAP.  Do not smoke. Tobacco smoke residue can damage the machine.  For home use, CPAP and BiPAP machines can be rented or purchased through home health care companies. Many different brands of machines are available. Renting a machine before purchasing may help you find out which particular machine works well for you.  Keep the CPAP or BiPAP machine and attachments clean. Ask your health care provider for specific  instructions. Get help right away if:  You have redness or open areas around your nose or mouth where the mask fits.  You have trouble using the CPAP or BiPAP machine.  You cannot tolerate wearing the CPAP or BiPAP mask.  You have pain, discomfort, and bloating in your abdomen. Summary  CPAP and BiPAP are methods of helping a person breathe with the use of air pressure.  Both CPAP and BiPAP are provided by a small machine with a flexible plastic tube that attaches to a plastic mask.  If you have trouble with the mask, it is very important that you talk with your health care provider about finding a way to make the mask easier to tolerate. This  information is not intended to replace advice given to you by your health care provider. Make sure you discuss any questions you have with your health care provider. Document Released: 02/04/2004 Document Revised: 03/27/2016 Document Reviewed: 03/27/2016 Elsevier Interactive Patient Education  2017 [employer].

## 2017-12-05 NOTE — Assessment & Plan Note (Signed)
No ILD seen on last CT.  Keep follow-up with Dr. Chase Caller in 5 to 8 months.

## 2017-12-17 ENCOUNTER — Other Ambulatory Visit: Payer: Self-pay

## 2017-12-17 MED ORDER — GABAPENTIN 300 MG PO CAPS: 300.0000 mg | ORAL_CAPSULE | Freq: Every day | ORAL | 1 refills | Status: DC

## 2017-12-19 ENCOUNTER — Ambulatory Visit (INDEPENDENT_AMBULATORY_CARE_PROVIDER_SITE_OTHER): Payer: Medicare Other | Admitting: Adult Health

## 2017-12-19 ENCOUNTER — Encounter: Payer: Self-pay | Admitting: Adult Health

## 2017-12-19 ENCOUNTER — Ambulatory Visit: Payer: Self-pay | Admitting: *Deleted

## 2017-12-19 VITALS — BP 146/66 | Temp 97.9°F | Wt 186.0 lb

## 2017-12-19 DIAGNOSIS — K529 Noninfective gastroenteritis and colitis, unspecified: Secondary | ICD-10-CM

## 2017-12-19 NOTE — Telephone Encounter (Signed)
Pt's wife, [name], called stating that the pt has been having nausea, vomitting, diarrhea, and chills which started 12/18/17 at 2100 after eating tamales; she states that he had a zofran last night but he threw that back up; she also states that she gave him imodium and 4 oz glass of pedialyte at 0845; she also states that the pt could not use his cpap last night due to vomiting; recommendations made per nurse triage protocol to include going to the ED; the pt wants to be seen in office by Dorothyann Peng; spoke with Sunday Spillers at [facility] and was told that they may be able to get ahead of severe dehydration if the pt is seen in office; pt offered and accepted appointment with Dorothyann Peng, [facility] today at 1000; will route to office for notification of this upcoming appointment.   Reason for Disposition . [1] MODERATE vomiting (e.g., 3 - 5 times/day) AND [2] age > 53  Answer Assessment - Initial Assessment Questions 1. VOMITING SEVERITY: "How many times have you vomited in the past 24 hours?"     - MILD:  1 - 2 times/day    - MODERATE: 3 - 5 times/day, decreased oral intake without significant weight loss or symptoms of dehydration    - SEVERE: 6 or more times/day, vomits everything or nearly everything, with significant weight loss, symptoms of dehydration      8 times 2. ONSET: "When did the vomiting begin?"      12/18/17 at 2100 3. FLUIDS: "What fluids or food have you vomited up today?" "Have you been able to keep any fluids down?"     no 4. ABDOMINAL PAIN: "Are your having any abdominal pain?" If yes : "How bad is it and what does it feel like?" (e.g., crampy, dull, intermittent, constant)      no 5. DIARRHEA: "Is there any diarrhea?" If so, ask: "How many times today?"      3 times this morning; "a lot" last night 6. CONTACTS: "Is there anyone else in the family with the same symptoms?"    no 7. CAUSE: "What do you think is causing your vomiting?"     ? food poisoning 8. HYDRATION  STATUS: "Any signs of dehydration?" (e.g., dry mouth [not only dry lips], too weak to stand) "When did you last urinate?"     Dry mouth; urinated 0900 this morning 9. OTHER SYMPTOMS: "Do you have any other symptoms?" (e.g., fever, headache, vertigo, vomiting blood or coffee grounds, recent head injury)     Chills, watery diarrhea 10. PREGNANCY: "Is there any chance you are pregnant?" "When was your last menstrual period?"       no  Protocols used: [facility]
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
## 2017-12-20 ENCOUNTER — Other Ambulatory Visit (HOSPITAL_COMMUNITY): Payer: Self-pay | Admitting: Internal Medicine

## 2017-12-21 ENCOUNTER — Inpatient Hospital Stay (HOSPITAL_COMMUNITY)
Admission: EM | Admit: 2017-12-21 | Discharge: 2017-12-29 | DRG: 339 | Disposition: A | Discharge status: Home / Self Care | Payer: Medicare Other | Attending: Family Medicine | Admitting: Family Medicine

## 2017-12-21 ENCOUNTER — Encounter (HOSPITAL_COMMUNITY): Payer: Self-pay

## 2017-12-21 ENCOUNTER — Ambulatory Visit (INDEPENDENT_AMBULATORY_CARE_PROVIDER_SITE_OTHER): Payer: Medicare Other | Admitting: Adult Health

## 2017-12-21 ENCOUNTER — Other Ambulatory Visit: Payer: Self-pay

## 2017-12-21 ENCOUNTER — Encounter: Payer: Self-pay | Admitting: Adult Health

## 2017-12-21 ENCOUNTER — Emergency Department (HOSPITAL_COMMUNITY): Payer: Medicare Other

## 2017-12-21 VITALS — BP 150/88 | HR 57 | Temp 98.4°F | Wt 187.0 lb

## 2017-12-21 DIAGNOSIS — I252 Old myocardial infarction: Secondary | ICD-10-CM

## 2017-12-21 DIAGNOSIS — R1031 Right lower quadrant pain: Secondary | ICD-10-CM

## 2017-12-21 DIAGNOSIS — I2510 Atherosclerotic heart disease of native coronary artery without angina pectoris: Secondary | ICD-10-CM | POA: Diagnosis present

## 2017-12-21 DIAGNOSIS — I5022 Chronic systolic (congestive) heart failure: Secondary | ICD-10-CM | POA: Diagnosis present

## 2017-12-21 DIAGNOSIS — Z87891 Personal history of nicotine dependence: Secondary | ICD-10-CM

## 2017-12-21 DIAGNOSIS — G40909 Epilepsy, unspecified, not intractable, without status epilepticus: Secondary | ICD-10-CM | POA: Diagnosis present

## 2017-12-21 DIAGNOSIS — N400 Enlarged prostate without lower urinary tract symptoms: Secondary | ICD-10-CM | POA: Diagnosis present

## 2017-12-21 DIAGNOSIS — F329 Major depressive disorder, single episode, unspecified: Secondary | ICD-10-CM | POA: Diagnosis present

## 2017-12-21 DIAGNOSIS — K3533 Acute appendicitis with perforation and localized peritonitis, with abscess: Principal | ICD-10-CM | POA: Diagnosis present

## 2017-12-21 DIAGNOSIS — R188 Other ascites: Secondary | ICD-10-CM | POA: Diagnosis present

## 2017-12-21 DIAGNOSIS — I472 Ventricular tachycardia: Secondary | ICD-10-CM | POA: Diagnosis not present

## 2017-12-21 DIAGNOSIS — K219 Gastro-esophageal reflux disease without esophagitis: Secondary | ICD-10-CM | POA: Diagnosis present

## 2017-12-21 DIAGNOSIS — K9189 Other postprocedural complications and disorders of digestive system: Secondary | ICD-10-CM | POA: Diagnosis not present

## 2017-12-21 DIAGNOSIS — J438 Other emphysema: Secondary | ICD-10-CM | POA: Diagnosis present

## 2017-12-21 DIAGNOSIS — I10 Essential (primary) hypertension: Secondary | ICD-10-CM | POA: Diagnosis present

## 2017-12-21 DIAGNOSIS — N183 Chronic kidney disease, stage 3 unspecified: Secondary | ICD-10-CM | POA: Diagnosis present

## 2017-12-21 DIAGNOSIS — K5641 Fecal impaction: Secondary | ICD-10-CM | POA: Diagnosis present

## 2017-12-21 DIAGNOSIS — I420 Dilated cardiomyopathy: Secondary | ICD-10-CM

## 2017-12-21 DIAGNOSIS — G4733 Obstructive sleep apnea (adult) (pediatric): Secondary | ICD-10-CM | POA: Diagnosis present

## 2017-12-21 DIAGNOSIS — K567 Ileus, unspecified: Secondary | ICD-10-CM | POA: Diagnosis not present

## 2017-12-21 DIAGNOSIS — K660 Peritoneal adhesions (postprocedural) (postinfection): Secondary | ICD-10-CM | POA: Diagnosis present

## 2017-12-21 DIAGNOSIS — M19019 Primary osteoarthritis, unspecified shoulder: Secondary | ICD-10-CM | POA: Diagnosis present

## 2017-12-21 DIAGNOSIS — Z7951 Long term (current) use of inhaled steroids: Secondary | ICD-10-CM

## 2017-12-21 DIAGNOSIS — Z7901 Long term (current) use of anticoagulants: Secondary | ICD-10-CM

## 2017-12-21 DIAGNOSIS — I480 Paroxysmal atrial fibrillation: Secondary | ICD-10-CM | POA: Diagnosis present

## 2017-12-21 DIAGNOSIS — Z888 Allergy status to other drugs, medicaments and biological substances status: Secondary | ICD-10-CM | POA: Diagnosis not present

## 2017-12-21 DIAGNOSIS — Z8679 Personal history of other diseases of the circulatory system: Secondary | ICD-10-CM

## 2017-12-21 DIAGNOSIS — Z841 Family history of disorders of kidney and ureter: Secondary | ICD-10-CM

## 2017-12-21 DIAGNOSIS — Z820 Family history of epilepsy and other diseases of the nervous system: Secondary | ICD-10-CM

## 2017-12-21 DIAGNOSIS — K3532 Acute appendicitis with perforation and localized peritonitis, without abscess: Secondary | ICD-10-CM | POA: Diagnosis not present

## 2017-12-21 DIAGNOSIS — H547 Unspecified visual loss: Secondary | ICD-10-CM | POA: Diagnosis present

## 2017-12-21 DIAGNOSIS — I130 Hypertensive heart and chronic kidney disease with heart failure and stage 1 through stage 4 chronic kidney disease, or unspecified chronic kidney disease: Secondary | ICD-10-CM | POA: Diagnosis present

## 2017-12-21 DIAGNOSIS — I5042 Chronic combined systolic (congestive) and diastolic (congestive) heart failure: Secondary | ICD-10-CM | POA: Diagnosis present

## 2017-12-21 DIAGNOSIS — Z79899 Other long term (current) drug therapy: Secondary | ICD-10-CM

## 2017-12-21 DIAGNOSIS — Z8249 Family history of ischemic heart disease and other diseases of the circulatory system: Secondary | ICD-10-CM

## 2017-12-21 DIAGNOSIS — I255 Ischemic cardiomyopathy: Secondary | ICD-10-CM | POA: Diagnosis present

## 2017-12-21 DIAGNOSIS — Z8349 Family history of other endocrine, nutritional and metabolic diseases: Secondary | ICD-10-CM

## 2017-12-21 DIAGNOSIS — K3580 Unspecified acute appendicitis: Secondary | ICD-10-CM | POA: Diagnosis not present

## 2017-12-21 DIAGNOSIS — E559 Vitamin D deficiency, unspecified: Secondary | ICD-10-CM | POA: Diagnosis present

## 2017-12-21 DIAGNOSIS — I4891 Unspecified atrial fibrillation: Secondary | ICD-10-CM | POA: Diagnosis present

## 2017-12-21 DIAGNOSIS — I482 Chronic atrial fibrillation: Secondary | ICD-10-CM | POA: Diagnosis not present

## 2017-12-21 DIAGNOSIS — Z8673 Personal history of transient ischemic attack (TIA), and cerebral infarction without residual deficits: Secondary | ICD-10-CM | POA: Diagnosis not present

## 2017-12-21 DIAGNOSIS — J449 Chronic obstructive pulmonary disease, unspecified: Secondary | ICD-10-CM | POA: Diagnosis present

## 2017-12-21 DIAGNOSIS — Z885 Allergy status to narcotic agent status: Secondary | ICD-10-CM | POA: Diagnosis not present

## 2017-12-21 DIAGNOSIS — Z833 Family history of diabetes mellitus: Secondary | ICD-10-CM

## 2017-12-21 DIAGNOSIS — E782 Mixed hyperlipidemia: Secondary | ICD-10-CM | POA: Diagnosis present

## 2017-12-21 DIAGNOSIS — H9190 Unspecified hearing loss, unspecified ear: Secondary | ICD-10-CM | POA: Diagnosis present

## 2017-12-21 DIAGNOSIS — Z825 Family history of asthma and other chronic lower respiratory diseases: Secondary | ICD-10-CM

## 2017-12-21 DIAGNOSIS — Z95810 Presence of automatic (implantable) cardiac defibrillator: Secondary | ICD-10-CM

## 2017-12-21 DIAGNOSIS — Z950 Presence of cardiac pacemaker: Secondary | ICD-10-CM | POA: Diagnosis present

## 2017-12-21 LAB — CBC
HCT: 43.3 % (ref 39.0–52.0)
Hemoglobin: 15 g/dL (ref 13.0–17.0)
MCH: 31.6 pg (ref 26.0–34.0)
MCHC: 34.6 g/dL (ref 30.0–36.0)
MCV: 91.4 fL (ref 78.0–100.0)
PLATELETS: 264 10*3/uL (ref 150–400)
RBC: 4.74 MIL/uL (ref 4.22–5.81)
RDW: 13.6 % (ref 11.5–15.5)
WBC: 11.6 10*3/uL — AB (ref 4.0–10.5)

## 2017-12-21 LAB — COMPREHENSIVE METABOLIC PANEL
ALK PHOS: 42 U/L (ref 38–126)
ALT: 34 U/L (ref 0–44)
AST: 24 U/L (ref 15–41)
Albumin: 4.1 g/dL (ref 3.5–5.0)
Anion gap: 8 (ref 5–15)
BILIRUBIN TOTAL: 0.7 mg/dL (ref 0.3–1.2)
BUN: 16 mg/dL (ref 8–23)
CALCIUM: 9 mg/dL (ref 8.9–10.3)
CO2: 28 mmol/L (ref 22–32)
CREATININE: 1.49 mg/dL — AB (ref 0.61–1.24)
Chloride: 103 mmol/L (ref 98–111)
GFR, EST AFRICAN AMERICAN: 53 mL/min — AB (ref >60)
GFR, EST NON AFRICAN AMERICAN: 45 mL/min — AB (ref >60)
Glucose, Bld: 131 mg/dL — ABNORMAL HIGH (ref 70–99)
Potassium: 3.8 mmol/L (ref 3.5–5.1)
Sodium: 139 mmol/L (ref 135–145)
TOTAL PROTEIN: 7.2 g/dL (ref 6.5–8.1)

## 2017-12-21 LAB — URINALYSIS, ROUTINE W REFLEX MICROSCOPIC
Bilirubin Urine: NEGATIVE
GLUCOSE, UA: NEGATIVE mg/dL
HGB URINE DIPSTICK: NEGATIVE
Ketones, ur: NEGATIVE mg/dL
Leukocytes, UA: NEGATIVE
Nitrite: NEGATIVE
PH: 5 (ref 5.0–8.0)
PROTEIN: NEGATIVE mg/dL
Specific Gravity, Urine: 1.041 — ABNORMAL HIGH (ref 1.005–1.030)

## 2017-12-21 LAB — PROTIME-INR
INR: 1.16
PROTHROMBIN TIME: 14.7 s (ref 11.4–15.2)

## 2017-12-21 LAB — TYPE AND SCREEN
ABO/RH(D): B POS
Antibody Screen: NEGATIVE

## 2017-12-21 LAB — LIPASE, BLOOD: Lipase: 57 U/L — ABNORMAL HIGH (ref 11–51)

## 2017-12-21 LAB — LACTIC ACID, PLASMA: Lactic Acid, Venous: 1.4 mmol/L (ref 0.5–1.9)

## 2017-12-21 MED ORDER — PIPERACILLIN-TAZOBACTAM 3.375 G IVPB 30 MIN
3.3750 g | Freq: Once | INTRAVENOUS | Status: AC
  Administered 2017-12-21: 3.375 g via INTRAVENOUS
  Filled 2017-12-21: qty 50

## 2017-12-21 MED ORDER — SODIUM CHLORIDE 0.9 % IV SOLN
INTRAVENOUS | Status: AC
  Administered 2017-12-21: 22:00:00 via INTRAVENOUS

## 2017-12-21 MED ORDER — SODIUM CHLORIDE 0.9 % IV BOLUS: 1000.0000 mL | Freq: Once | INTRAVENOUS | Status: DC

## 2017-12-21 MED ORDER — CHLORHEXIDINE GLUCONATE 0.12 % MT SOLN
15.0000 mL | Freq: Two times a day (BID) | OROMUCOSAL | Status: DC
  Administered 2017-12-21 – 2017-12-29 (×14): 15 mL via OROMUCOSAL
  Filled 2017-12-21 (×16): qty 15

## 2017-12-21 MED ORDER — MORPHINE SULFATE (PF) 4 MG/ML IV SOLN
4.0000 mg | Freq: Once | INTRAVENOUS | Status: AC
  Administered 2017-12-21: 4 mg via INTRAVENOUS
  Filled 2017-12-21: qty 1

## 2017-12-21 MED ORDER — ONDANSETRON HCL 4 MG/2ML IJ SOLN
4.0000 mg | Freq: Once | INTRAMUSCULAR | Status: AC
  Administered 2017-12-21: 4 mg via INTRAVENOUS
  Filled 2017-12-21: qty 2

## 2017-12-21 MED ORDER — IOPAMIDOL (ISOVUE-300) INJECTION 61%
100.0000 mL | Freq: Once | INTRAVENOUS | Status: AC | PRN
  Administered 2017-12-21: 100 mL via INTRAVENOUS

## 2017-12-21 MED ORDER — MORPHINE SULFATE (PF) 4 MG/ML IV SOLN
4.0000 mg | INTRAVENOUS | Status: DC | PRN
  Administered 2017-12-21 – 2017-12-23 (×8): 4 mg via INTRAVENOUS
  Filled 2017-12-21 (×8): qty 1

## 2017-12-21 MED ORDER — SODIUM CHLORIDE 0.9 % IV BOLUS
250.0000 mL | Freq: Once | INTRAVENOUS | Status: AC
  Administered 2017-12-21: 250 mL via INTRAVENOUS

## 2017-12-21 MED ORDER — ALBUTEROL SULFATE (2.5 MG/3ML) 0.083% IN NEBU: 3.0000 mL | INHALATION_SOLUTION | RESPIRATORY_TRACT | Status: DC | PRN

## 2017-12-21 MED ORDER — ORAL CARE MOUTH RINSE
15.0000 mL | Freq: Two times a day (BID) | OROMUCOSAL | Status: DC
  Administered 2017-12-23 – 2017-12-28 (×5): 15 mL via OROMUCOSAL

## 2017-12-21 MED ORDER — RANOLAZINE ER 500 MG PO TB12
500.0000 mg | ORAL_TABLET | Freq: Every day | ORAL | Status: DC
  Administered 2017-12-21 – 2017-12-28 (×8): 500 mg via ORAL
  Filled 2017-12-21 (×9): qty 1

## 2017-12-21 MED ORDER — SOTALOL HCL 80 MG PO TABS
80.0000 mg | ORAL_TABLET | Freq: Every day | ORAL | Status: DC
  Administered 2017-12-21 – 2017-12-28 (×8): 80 mg via ORAL
  Filled 2017-12-21 (×9): qty 1

## 2017-12-21 MED ORDER — PIPERACILLIN-TAZOBACTAM 3.375 G IVPB
3.3750 g | Freq: Three times a day (TID) | INTRAVENOUS | Status: DC
  Administered 2017-12-22 – 2017-12-28 (×19): 3.375 g via INTRAVENOUS
  Filled 2017-12-21 (×18): qty 50

## 2017-12-21 MED ORDER — IOPAMIDOL (ISOVUE-300) INJECTION 61%
INTRAVENOUS | Status: AC
  Filled 2017-12-21: qty 100

## 2017-12-21 MED ORDER — ONDANSETRON HCL 4 MG/2ML IJ SOLN
4.0000 mg | Freq: Four times a day (QID) | INTRAMUSCULAR | Status: DC | PRN
  Administered 2017-12-24 – 2017-12-26 (×4): 4 mg via INTRAVENOUS
  Filled 2017-12-21 (×4): qty 2

## 2017-12-21 MED ORDER — MOMETASONE FURO-FORMOTEROL FUM 100-5 MCG/ACT IN AERO: 2.0000 | INHALATION_SPRAY | Freq: Two times a day (BID) | RESPIRATORY_TRACT | Status: DC

## 2017-12-21 MED ORDER — ONDANSETRON HCL 4 MG PO TABS: 4.0000 mg | ORAL_TABLET | Freq: Four times a day (QID) | ORAL | Status: DC | PRN

## 2017-12-21 MED ORDER — METOPROLOL SUCCINATE ER 50 MG PO TB24
50.0000 mg | ORAL_TABLET | Freq: Every day | ORAL | Status: DC
  Administered 2017-12-22 – 2017-12-23 (×2): 50 mg via ORAL
  Filled 2017-12-21 (×3): qty 1

## 2017-12-21 NOTE — ED Notes (Signed)
Patient transported to CT 
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
## 2017-12-21 NOTE — ED Triage Notes (Signed)
Pt sent from PCP. He reports RLQ and N/V/D pain x3 days. He was seen 2 days ago for same and given zofran without relief. PCP sent him to rule out appendicitis. A&Ox4. Ambulatory.
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
## 2017-12-21 NOTE — Progress Notes (Signed)
Per pt's wife, [place], she would like to limit family member involvement in pt care. Pt's wife is POA. Per wife, she would like for any medical information and decision making to be relayed to her and pt only. Sign posted on pt's door for visitors to check in with front desk before entering.

## 2017-12-21 NOTE — Progress Notes (Signed)
Pharmacy Antibiotic Note  [name] is a 71 y.o. male admitted on 12/21/2017 with Intrabdominal infection.  Pharmacy has been consulted for Zosyn dosing.  Plan: Zosyn 3.375g IV q8h (4 hour infusion).    Temp (24hrs), Avg:98.5 F (36.9 C), Min:98.2 F (36.8 C), Max:99 F (37.2 C)  Recent Labs  Lab 12/21/17 1633  WBC 11.6*  CREATININE 1.49*    Estimated Creatinine Clearance: 45.5 mL/min (A) (by C-G formula based on SCr of 1.49 mg/dL (H)).    Allergies  Allergen Reactions  . Aricept [Donepezil Hcl] Other (See Comments)    Worsens renal function  . Codeine Hives  . Nexium [Esomeprazole] Other (See Comments)    Severely worsens renal function  . Omeprazole Hives  . Pantoprazole Sodium Other (See Comments)    Renal failure  . Tramadol Nausea And Vomiting   Antimicrobials this admission: 8/2 Zosyn >>   Microbiology results: None ordered on admission  Thank you for allowing pharmacy to be a part of this patient's care.  Minda Ditto PharmD Pager [phone] 12/21/2017, 8:29 PM

## 2017-12-22 LAB — BASIC METABOLIC PANEL
Anion gap: 8 (ref 5–15)
BUN: 14 mg/dL (ref 8–23)
CALCIUM: 8.8 mg/dL — AB (ref 8.9–10.3)
CHLORIDE: 105 mmol/L (ref 98–111)
CO2: 27 mmol/L (ref 22–32)
CREATININE: 1.38 mg/dL — AB (ref 0.61–1.24)
GFR calc non Af Amer: 50 mL/min — ABNORMAL LOW (ref >60)
GFR, EST AFRICAN AMERICAN: 58 mL/min — AB (ref >60)
Glucose, Bld: 141 mg/dL — ABNORMAL HIGH (ref 70–99)
Potassium: 3.5 mmol/L (ref 3.5–5.1)
SODIUM: 140 mmol/L (ref 135–145)

## 2017-12-22 LAB — CBC
HCT: 45.1 % (ref 39.0–52.0)
Hemoglobin: 15.4 g/dL (ref 13.0–17.0)
MCH: 31.2 pg (ref 26.0–34.0)
MCHC: 34.1 g/dL (ref 30.0–36.0)
MCV: 91.3 fL (ref 78.0–100.0)
PLATELETS: 241 10*3/uL (ref 150–400)
RBC: 4.94 MIL/uL (ref 4.22–5.81)
RDW: 13.5 % (ref 11.5–15.5)
WBC: 10.5 10*3/uL (ref 4.0–10.5)

## 2017-12-22 LAB — LACTIC ACID, PLASMA: LACTIC ACID, VENOUS: 1.5 mmol/L (ref 0.5–1.9)

## 2017-12-22 LAB — ABO/RH: ABO/RH(D): B POS

## 2017-12-22 MED ORDER — NON FORMULARY: 2.0000 | Freq: Two times a day (BID) | Status: DC

## 2017-12-22 MED ORDER — BUDESONIDE-FORMOTEROL FUMARATE 160-4.5 MCG/ACT IN AERO
2.0000 | INHALATION_SPRAY | Freq: Two times a day (BID) | RESPIRATORY_TRACT | Status: DC
  Administered 2017-12-22 – 2017-12-29 (×13): 2 via RESPIRATORY_TRACT

## 2017-12-22 MED ORDER — IBUPROFEN 200 MG PO TABS
600.0000 mg | ORAL_TABLET | Freq: Four times a day (QID) | ORAL | Status: DC | PRN
  Filled 2017-12-22: qty 3

## 2017-12-22 MED ORDER — POLYETHYLENE GLYCOL 3350 17 G PO PACK
17.0000 g | PACK | Freq: Every day | ORAL | Status: DC
  Administered 2017-12-22 – 2017-12-28 (×5): 17 g via ORAL
  Filled 2017-12-22 (×7): qty 1

## 2017-12-22 MED ORDER — SODIUM CHLORIDE 0.9 % IV SOLN
INTRAVENOUS | Status: DC | PRN
  Administered 2017-12-22: 1000 mL via INTRAVENOUS
  Administered 2017-12-24 (×2): 250 mL via INTRAVENOUS

## 2017-12-22 MED ORDER — ACETAMINOPHEN 325 MG PO TABS
650.0000 mg | ORAL_TABLET | ORAL | Status: DC | PRN
  Administered 2017-12-22 – 2017-12-23 (×2): 650 mg via ORAL
  Filled 2017-12-22 (×3): qty 2

## 2017-12-22 MED ORDER — SENNOSIDES-DOCUSATE SODIUM 8.6-50 MG PO TABS
1.0000 | ORAL_TABLET | Freq: Once | ORAL | Status: AC
  Administered 2017-12-22: 1 via ORAL
  Filled 2017-12-22: qty 1

## 2017-12-22 NOTE — Progress Notes (Signed)
Temperature 100.1 after 650 mg of Tylenol at 1514. MD notified.  Barbee Shropshire. Brigitte Pulse, RN

## 2017-12-22 NOTE — Progress Notes (Signed)
Assessment & Plan: HD#2 - perforated acute appendicitis on anticoagulation  WBC slightly improved on IV abx  Continue clear liquid diet today  Encourage OOB, ambulation  Hold Eliquis  Await cardiology evaluation  Will tentatively plan repeat CT scan on Monday 8/5.  Then make decision regarding IR drainage procedure versus operative intervention.  Will follow with you.        Earnstine Regal, MD, [facility]       Office: [phone]   Chief Complaint: Abdominal pain, acute appendicitis with perforation  Subjective: Patient in bed, seizure precautions noted.  Complains of RLQ pain.  Did sleep with morphine administration.  Objective: Vital signs in last 24 hours: Temp:  [98.2 F (36.8 C)-99.3 F (37.4 C)] 98.5 F (36.9 C) (08/03 0449) Pulse Rate:  [55-83] 59 (08/03 0449) Resp:  [15-20] 20 (08/03 0449) BP: (118-150)/(62-88) 118/62 (08/03 0449) SpO2:  [92 %-100 %] 100 % (08/03 0449) Weight:  [83.6 kg (184 lb 4.9 oz)-84.8 kg (187 lb)] 83.6 kg (184 lb 4.9 oz) (08/02 2110) Last BM Date: 12/20/17  Intake/Output from previous day: 08/02 0701 - 08/03 0700 In: 895.6 [I.V.:526; IV Piggyback:369.7] Out: 325 [Urine:325] Intake/Output this shift: Total I/O In: 150 [P.O.:150] Out: -   Physical Exam: HEENT - sclerae clear, mucous membranes moist Neck - soft Chest - clear bilaterally Cor - RRR Abdomen - firm, voluntary guarding; tender RLQ with guarding; BS present Ext - no edema, non-tender Neuro - alert & oriented  Lab Results:  Recent Labs    12/21/17 1633 12/22/17 0043  WBC 11.6* 10.5  HGB 15.0 15.4  HCT 43.3 45.1  PLT 264 241   BMET Recent Labs    12/21/17 1633 12/22/17 0043  NA 139 140  K 3.8 3.5  CL 103 105  CO2 28 27  GLUCOSE 131* 141*  BUN 16 14  CREATININE 1.49* 1.38*  CALCIUM 9.0 8.8*   PT/INR Recent Labs    12/21/17 1918  LABPROT 14.7  INR 1.16   Comprehensive Metabolic Panel:    Component Value  Date/Time   NA 140 12/22/2017 0043   NA 139 12/21/2017 1633   NA 139 08/13/2017 1019   NA 138 02/12/2017   K 3.5 12/22/2017 0043   K 3.8 12/21/2017 1633   CL 105 12/22/2017 0043   CL 103 12/21/2017 1633   CO2 27 12/22/2017 0043   CO2 28 12/21/2017 1633   BUN 14 12/22/2017 0043   BUN 16 12/21/2017 1633   BUN 23 08/13/2017 1019   BUN 15 02/12/2017   CREATININE 1.38 (H) 12/22/2017 0043   CREATININE 1.49 (H) 12/21/2017 1633   CREATININE 1.86 (H) 01/26/2016 1144   CREATININE 1.72 (H) 07/19/2015 1551   GLUCOSE 141 (H) 12/22/2017 0043   GLUCOSE 131 (H) 12/21/2017 1633   CALCIUM 8.8 (L) 12/22/2017 0043   CALCIUM 9.0 12/21/2017 1633   AST 24 12/21/2017 1633   AST 23 03/09/2017 1540   ALT 34 12/21/2017 1633   ALT 21 03/09/2017 1540   ALKPHOS 42 12/21/2017 1633   ALKPHOS 56 03/09/2017 1540   BILITOT 0.7 12/21/2017 1633   BILITOT 0.6 03/09/2017 1540   BILITOT 0.3 07/12/2016 1024   PROT 7.2 12/21/2017 1633   PROT 7.0 03/09/2017 1540   PROT 7.1 07/12/2016 1024   ALBUMIN 4.1 12/21/2017 1633   ALBUMIN 4.1 03/09/2017 1540   ALBUMIN 4.7 07/12/2016 1024    Studies/Results: Ct Abdomen Pelvis  W Contrast  Result Date: 12/21/2017 CLINICAL DATA:  RIGHT lower quadrant pain. Seen 2 days ago given Zofran. EXAM: CT ABDOMEN AND PELVIS WITH CONTRAST TECHNIQUE: Multidetector CT imaging of the abdomen and pelvis was performed using the standard protocol following bolus administration of intravenous contrast. CONTRAST:  165mL ISOVUE-300 IOPAMIDOL (ISOVUE-300) INJECTION 61% COMPARISON:  None. FINDINGS: Lower chest: Emphysematous change lung bases. Cardiomegaly with pacemaker. Hepatobiliary: Multiple simple hepatic cysts. No gallstones or biliary ductal dilatation. No solid hepatic mass. Pancreas: Normal. Spleen: Normal. Adrenals/Urinary Tract: Renal cystic disease. No hydronephrosis. Normal adrenal glands. Stomach/Bowel: Stomach is normal. Mild small bowel ileus. There is acute appendicitis with an enlarged  thick-walled appendix, and multiple appendicoliths. There is a localized perforation in the RIGHT lower quadrant with free fluid inferolaterally. No pneumoperitoneum. Vascular/Lymphatic: Aortic atherosclerosis. Reproductive: Prostate is unremarkable. Other: No abdominal wall hernia or abnormality. No abdominopelvic ascites. Musculoskeletal: No acute or significant osseous findings. IMPRESSION: Acute appendicitis, multiple appendicoliths, and localized perforation with free fluid; no pneumoperitoneum. Localized ileus. These results were called by telephone at the time of interpretation on 12/21/2017 at 6:40 pm to Dr. Nanda Quinton , who verbally acknowledged these results. Electronically Signed   By: Staci Righter M.D.   On: 12/21/2017 18:42      Kalayah Leske M 12/22/2017  Patient ID: [name], male   DOB: 09/17/1945, 72 y.o.   MRN: [id number]

## 2017-12-22 NOTE — Progress Notes (Signed)
PROGRESS NOTE  [name] BPZ:[id number] DOB: March 07, 1946 DOA: 12/21/2017 PCP: Dorothyann Peng, NP  HPI/Recap of past 24 hours:   HPI: [name] is a 72 y.o. male with medical history significant of A. fib on Eliquis, CHF, COPD, EF around 30%, pacemaker, seizure disorder comes in with over 2 days of nausea vomiting and diarrhea with right lower quadrant abdominal pain.  Patient was seen by nurse practitioner in the office was told if he did not improve in 48 hours to seek further attention in the ED.  He denies any fevers.  His symptoms have persisted and worsened.  Today on CAT scan patient found to have perforated appendix.  He is been unable to hold down hardly any of his home p.o. meds.  His last dose of Eliquis was yesterday morning.  Patient be referred for admission for acute appendicitis.  Subjective: Patient seen at bedside.  Complains of constipation also fever and chills.  He has been running temperatures in the 100s he was started on Tylenol    Assessment/Plan: Principal Problem:   Acute appendicitis with perforation and localized peritonitis, without abscess Active Problems:   Obstructive sleep apnea   Essential hypertension, benign   Chronic systolic CHF (congestive heart failure) ([name])   Atrial fibrillation ([name])   CKD (chronic kidney disease), stage III ([name])   Pacemaker   Seizure disorder ([name])   COPD (chronic obstructive pulmonary disease) ([name])  HD#2 - perforated acute appendicitis on anticoagulation             WBC slightly improved on IV abx             Continue clear liquid diet today             Encourage OOB, ambulation             Hold Eliquis             Await cardiology evaluation  Surgical consult and recommendation appreciated    DVT prophylaxis: SCDs Code Status: Full Family Communication: Wife  disposition Plan: Days Consults called: General surgery Admission status:  Inpatient  Procedures:  None  Antimicrobials:  Zosyn    Objective: Vitals:   12/21/17 2136 12/22/17 0449  BP: (!) 146/71 118/62  Pulse: (!) 55 (!) 59  Resp: 20 20  Temp: 99.3 F (37.4 C) 98.5 F (36.9 C)  SpO2: 96% 100%    Intake/Output Summary (Last 24 hours) at 12/22/2017 1338 Last data filed at 12/22/2017 0844 Gross per 24 hour  Intake 1045.62 ml  Output 325 ml  Net 720.62 ml   Filed Weights   12/21/17 2110  Weight: 83.6 kg (184 lb 4.9 oz)   Body mass index is 27.22 kg/m.  Exam:   Eyes: PERRL, lids and conjunctivae normal ENMT: Mucous membranes are moist. Posterior pharynx clear of any exudate or lesions.Normal dentition.  Neck: normal, supple, no masses, no thyromegaly Respiratory: clear to auscultation bilaterally, no wheezing, no crackles. Normal respiratory effort. No accessory muscle use.  Cardiovascular: Regular rate and rhythm, no murmurs / rubs / gallops. No extremity edema. 2+ pedal pulses. No carotid bruits.  Abdomen: Right lower quadrant tenderness no rebound or guarding, no masses palpated. No hepatosplenomegaly. Bowel sounds positive.  Musculoskeletal: no clubbing / cyanosis. No joint deformity upper and lower extremities. Good ROM, no contractures. Normal muscle tone.  Skin: no rashes, lesions, ulcers. No induration Neurologic: CN 2-12 grossly intact. Sensation intact, DTR normal. Strength 5/5 in all 4.  Psychiatric: Normal judgment and  insight. Alert and oriented x 3. Normal mood.      Data Reviewed: CBC: Recent Labs  Lab 12/21/17 1633 12/22/17 0043  WBC 11.6* 10.5  HGB 15.0 15.4  HCT 43.3 45.1  MCV 91.4 91.3  PLT 264 962   Basic Metabolic Panel: Recent Labs  Lab 12/21/17 1633 12/22/17 0043  NA 139 140  K 3.8 3.5  CL 103 105  CO2 28 27  GLUCOSE 131* 141*  BUN 16 14  CREATININE 1.49* 1.38*  CALCIUM 9.0 8.8*   GFR: Estimated Creatinine Clearance: 49.1 mL/min (A) (by C-G formula based on SCr of 1.38 mg/dL (H)). Liver  Function Tests: Recent Labs  Lab 12/21/17 1633  AST 24  ALT 34  ALKPHOS 42  BILITOT 0.7  PROT 7.2  ALBUMIN 4.1   Recent Labs  Lab 12/21/17 1633  LIPASE 57*   No results for input(s): AMMONIA in the last 168 hours. Coagulation Profile: Recent Labs  Lab 12/21/17 1918  INR 1.16   Cardiac Enzymes: No results for input(s): CKTOTAL, CKMB, CKMBINDEX, TROPONINI in the last 168 hours. BNP (last 3 results) No results for input(s): PROBNP in the last 8760 hours. HbA1C: No results for input(s): HGBA1C in the last 72 hours. CBG: No results for input(s): GLUCAP in the last 168 hours. Lipid Profile: No results for input(s): CHOL, HDL, LDLCALC, TRIG, CHOLHDL, LDLDIRECT in the last 72 hours. Thyroid Function Tests: No results for input(s): TSH, T4TOTAL, FREET4, T3FREE, THYROIDAB in the last 72 hours. Anemia Panel: No results for input(s): VITAMINB12, FOLATE, FERRITIN, TIBC, IRON, RETICCTPCT in the last 72 hours. Urine analysis:    Component Value Date/Time   COLORURINE YELLOW 12/21/2017 1918   APPEARANCEUR CLEAR 12/21/2017 1918   APPEARANCEUR Clear 09/06/2015 1053   LABSPEC 1.041 (H) 12/21/2017 1918   PHURINE 5.0 12/21/2017 1918   GLUCOSEU NEGATIVE 12/21/2017 1918   HGBUR NEGATIVE 12/21/2017 1918   BILIRUBINUR NEGATIVE 12/21/2017 1918   BILIRUBINUR n 01/03/2016 1014   BILIRUBINUR Negative 09/06/2015 1053   KETONESUR NEGATIVE 12/21/2017 1918   PROTEINUR NEGATIVE 12/21/2017 1918   UROBILINOGEN 0.2 01/03/2016 1014   UROBILINOGEN 2.0 (H) 02/24/2011 2051   NITRITE NEGATIVE 12/21/2017 1918   LEUKOCYTESUR NEGATIVE 12/21/2017 1918   LEUKOCYTESUR Negative 09/06/2015 1053   Sepsis Labs: @LABRCNTIP (procalcitonin:4,lacticidven:4)  )No results found for this or any previous visit (from the past 240 hour(s)).    Studies: Ct Abdomen Pelvis W Contrast  Result Date: 12/21/2017 CLINICAL DATA:  RIGHT lower quadrant pain. Seen 2 days ago given Zofran. EXAM: CT ABDOMEN AND PELVIS WITH  CONTRAST TECHNIQUE: Multidetector CT imaging of the abdomen and pelvis was performed using the standard protocol following bolus administration of intravenous contrast. CONTRAST:  144mL ISOVUE-300 IOPAMIDOL (ISOVUE-300) INJECTION 61% COMPARISON:  None. FINDINGS: Lower chest: Emphysematous change lung bases. Cardiomegaly with pacemaker. Hepatobiliary: Multiple simple hepatic cysts. No gallstones or biliary ductal dilatation. No solid hepatic mass. Pancreas: Normal. Spleen: Normal. Adrenals/Urinary Tract: Renal cystic disease. No hydronephrosis. Normal adrenal glands. Stomach/Bowel: Stomach is normal. Mild small bowel ileus. There is acute appendicitis with an enlarged thick-walled appendix, and multiple appendicoliths. There is a localized perforation in the RIGHT lower quadrant with free fluid inferolaterally. No pneumoperitoneum. Vascular/Lymphatic: Aortic atherosclerosis. Reproductive: Prostate is unremarkable. Other: No abdominal wall hernia or abnormality. No abdominopelvic ascites. Musculoskeletal: No acute or significant osseous findings. IMPRESSION: Acute appendicitis, multiple appendicoliths, and localized perforation with free fluid; no pneumoperitoneum. Localized ileus. These results were called by telephone at the time of interpretation on 12/21/2017 at  6:40 pm to Dr. Nanda Quinton , who verbally acknowledged these results. Electronically Signed   By: Staci Righter M.D.   On: 12/21/2017 18:42    Scheduled Meds: . budesonide-formoterol  2 puff Inhalation BID  . chlorhexidine  15 mL Mouth Rinse BID  . mouth rinse  15 mL Mouth Rinse q12n4p  . metoprolol succinate  50 mg Oral Daily  . ranolazine  500 mg Oral Daily  . sotalol  80 mg Oral Daily    Continuous Infusions: . piperacillin-tazobactam (ZOSYN)  IV 3.375 g (12/22/17 1147)     LOS: 1 day     Cristal Deer, MD [facility]  To reach me or the doctor on call, go to: [email or web address] Password [email or web address]  12/22/2017, 1:38 PM

## 2017-12-22 NOTE — Plan of Care (Signed)

## 2017-12-23 LAB — CBC WITH DIFFERENTIAL/PLATELET
Basophils Absolute: 0 10*3/uL (ref 0.0–0.1)
Basophils Relative: 0 %
EOS ABS: 0 10*3/uL (ref 0.0–0.7)
Eosinophils Relative: 0 %
HCT: 42.3 % (ref 39.0–52.0)
HEMOGLOBIN: 14.6 g/dL (ref 13.0–17.0)
Lymphocytes Relative: 13 %
Lymphs Abs: 2.1 10*3/uL (ref 0.7–4.0)
MCH: 31.2 pg (ref 26.0–34.0)
MCHC: 34.5 g/dL (ref 30.0–36.0)
MCV: 90.4 fL (ref 78.0–100.0)
Monocytes Absolute: 1.7 10*3/uL — ABNORMAL HIGH (ref 0.1–1.0)
Monocytes Relative: 11 %
NEUTROS PCT: 76 %
Neutro Abs: 12.4 10*3/uL — ABNORMAL HIGH (ref 1.7–7.7)
Platelets: 210 10*3/uL (ref 150–400)
RBC: 4.68 MIL/uL (ref 4.22–5.81)
RDW: 13.6 % (ref 11.5–15.5)
WBC: 16.2 10*3/uL — ABNORMAL HIGH (ref 4.0–10.5)

## 2017-12-23 LAB — BASIC METABOLIC PANEL
Anion gap: 10 (ref 5–15)
Anion gap: 10 (ref 5–15)
BUN: 12 mg/dL (ref 8–23)
BUN: 11 mg/dL (ref 8–23)
CHLORIDE: 97 mmol/L — AB (ref 98–111)
CO2: 27 mmol/L (ref 22–32)
CO2: 26 mmol/L (ref 22–32)
Calcium: 8.5 mg/dL — ABNORMAL LOW (ref 8.9–10.3)
Calcium: 8.4 mg/dL — ABNORMAL LOW (ref 8.9–10.3)
Chloride: 98 mmol/L (ref 98–111)
Creatinine, Ser: 1.58 mg/dL — ABNORMAL HIGH (ref 0.61–1.24)
Creatinine, Ser: 1.6 mg/dL — ABNORMAL HIGH (ref 0.61–1.24)
GFR calc Af Amer: 49 mL/min — ABNORMAL LOW (ref >60)
GFR calc Af Amer: 48 mL/min — ABNORMAL LOW (ref >60)
GFR calc non Af Amer: 42 mL/min — ABNORMAL LOW (ref >60)
GFR calc non Af Amer: 42 mL/min — ABNORMAL LOW (ref >60)
Glucose, Bld: 139 mg/dL — ABNORMAL HIGH (ref 70–99)
Glucose, Bld: 140 mg/dL — ABNORMAL HIGH (ref 70–99)
POTASSIUM: 3.9 mmol/L (ref 3.5–5.1)
POTASSIUM: 3.9 mmol/L (ref 3.5–5.1)
SODIUM: 135 mmol/L (ref 135–145)
Sodium: 133 mmol/L — ABNORMAL LOW (ref 135–145)

## 2017-12-23 LAB — MAGNESIUM: Magnesium: 1.7 mg/dL (ref 1.7–2.4)

## 2017-12-23 MED ORDER — LACOSAMIDE 50 MG PO TABS
25.0000 mg | ORAL_TABLET | Freq: Two times a day (BID) | ORAL | Status: DC
  Administered 2017-12-23 – 2017-12-25 (×4): 25 mg via ORAL
  Filled 2017-12-23 (×4): qty 1

## 2017-12-23 MED ORDER — TAMSULOSIN HCL 0.4 MG PO CAPS
0.4000 mg | ORAL_CAPSULE | Freq: Every day | ORAL | Status: DC
Start: 2017-12-23 — End: 2017-12-29
  Administered 2017-12-23 – 2017-12-28 (×6): 0.4 mg via ORAL
  Filled 2017-12-23 (×7): qty 1

## 2017-12-23 MED ORDER — METOPROLOL SUCCINATE ER 50 MG PO TB24
50.0000 mg | ORAL_TABLET | Freq: Every evening | ORAL | Status: DC
  Administered 2017-12-25 – 2017-12-28 (×4): 50 mg via ORAL
  Filled 2017-12-23 (×4): qty 1

## 2017-12-23 MED ORDER — GABAPENTIN 300 MG PO CAPS: 300.0000 mg | ORAL_CAPSULE | Freq: Every evening | ORAL | Status: DC | PRN

## 2017-12-23 MED ORDER — MEXILETINE HCL 150 MG PO CAPS
150.0000 mg | ORAL_CAPSULE | Freq: Two times a day (BID) | ORAL | Status: DC
  Administered 2017-12-23 – 2017-12-29 (×12): 150 mg via ORAL
  Filled 2017-12-23 (×12): qty 1

## 2017-12-23 MED ORDER — MORPHINE SULFATE (PF) 2 MG/ML IV SOLN
2.0000 mg | INTRAVENOUS | Status: DC | PRN
  Administered 2017-12-23: 2 mg via INTRAVENOUS
  Administered 2017-12-24: 4 mg via INTRAVENOUS
  Administered 2017-12-24 (×2): 2 mg via INTRAVENOUS
  Administered 2017-12-24: 4 mg via INTRAVENOUS
  Administered 2017-12-25 (×3): 2 mg via INTRAVENOUS
  Filled 2017-12-23 (×3): qty 1
  Filled 2017-12-23: qty 2
  Filled 2017-12-23 (×2): qty 1
  Filled 2017-12-23: qty 2
  Filled 2017-12-23: qty 1

## 2017-12-23 MED ORDER — ISOSORB DINITRATE-HYDRALAZINE 20-37.5 MG PO TABS
2.0000 | ORAL_TABLET | Freq: Two times a day (BID) | ORAL | Status: DC
  Administered 2017-12-24 (×2): 2 via ORAL
  Filled 2017-12-23 (×3): qty 2

## 2017-12-23 MED ORDER — MOMETASONE FURO-FORMOTEROL FUM 200-5 MCG/ACT IN AERO: 2.0000 | INHALATION_SPRAY | Freq: Two times a day (BID) | RESPIRATORY_TRACT | Status: DC

## 2017-12-23 MED ORDER — IOPAMIDOL (ISOVUE-300) INJECTION 61%
INTRAVENOUS | Status: AC
  Filled 2017-12-23: qty 30

## 2017-12-23 MED ORDER — METOPROLOL SUCCINATE ER 25 MG PO TB24
25.0000 mg | ORAL_TABLET | Freq: Every day | ORAL | Status: DC
  Administered 2017-12-24 – 2017-12-29 (×6): 25 mg via ORAL
  Filled 2017-12-23 (×6): qty 1

## 2017-12-23 MED ORDER — ISOSORB DINITRATE-HYDRALAZINE 20-37.5 MG PO TABS: 2.0000 | ORAL_TABLET | Freq: Two times a day (BID) | ORAL | Status: DC

## 2017-12-23 NOTE — Progress Notes (Signed)
[facility] Progress Note  Patient: [name] ZOX:[id number]   PCP: Dorothyann Peng, NP DOB: July 14, 1945   DOA: 12/21/2017   DOS: 12/23/2017   Date of Service: the patient was seen and examined on 12/23/2017  Subjective: Continues to have severe pain.  Also had low-grade fever.  Short 6 beat NSVT.  Asymptomatic.  No nausea no vomiting.  Passing gas.  Brief hospital course: Pt. with PMH of A. fib on Eliquis, chronic systolic CHF, EF 81%, ICD implant, COPD, recurrent VT S/P ablation, seizure disorder; admitted on 12/21/2017, presented with complaint of abdominal pain, was found to have acute appendicitis with mild perforation. Currently further plan is continue conservative management.  Assessment and Plan: 1.  Acute appendicitis. Mild perforation. On anti-Coblation. General surgery consulted who recommended medicine admission and conservative management. Cardiology consulted for preop clearance. Patient is on IV antibiotics, clear liquid diet, passing gas. Eliquis was on hold for possible procedure. Repeat CT scan ordered for tomorrow 12/24/2017 and surgery will decide further decisions based on the results. Appreciate their assistance. Continue pain management changing morphine to 2 to 4 mg every 2 hours.   2.  Chronic systolic CHF. Recurrent VT. NSVT. S/P AICD implant.  And chronic A. fib on chronic anticoagulation Cardiology consult for preop clearance. Follows up with heart failure in EP clinic here.  Patient's family is in process of getting a second opinion at [facility]. Continue Toprol-XL per family patient takes 25 and 50 mg. Continue BiDil as well. Holding [place]. Also holding Lasix. Aldactone is already on hold due to gynecomastia. Avoiding aggressive IV hydration since the patient's blood pressure is better as well as renal function stable. Holding anti-coagulation for now.  Cardiology consulted,?  Bridging with heparin but currently on hold until CT scan is done. Continue  sotalol, patient is on  mexiletine as well. Continue Ranexa. Rechecking BMP and mag. Per EDP note patient may require repeat ablation for recurrent VT.  3.  COPD. OSA. No evidence of ILD per pulmonary. Continue Symbicort and rescue inhalers.  4.  Chronic kidney disease stage III. Renal function stable at baseline.  Monitor daily BMP.  Diet: Clear liquid diet per surgery DVT Prophylaxis: subcutaneous Heparin  Advance goals of care discussion: full code  Family Communication: family was present at bedside, at the time of interview. The pt provided permission to discuss medical plan with the family. Opportunity was given to ask question and all questions were answered satisfactorily.   Disposition:  Discharge to home.  Consultants: General surgery, cardiology  Procedures: none  Antibiotics: Anti-infectives (From admission, onward)   Start     Dose/Rate Route Frequency Ordered Stop   12/22/17 0400  piperacillin-tazobactam (ZOSYN) IVPB 3.375 g     3.375 g 12.5 mL/hr over 240 Minutes Intravenous Every 8 hours 12/21/17 2035     12/21/17 1845  piperacillin-tazobactam (ZOSYN) IVPB 3.375 g     3.375 g 100 mL/hr over 30 Minutes Intravenous  Once 12/21/17 1841 12/21/17 1950       Objective: Physical Exam: Vitals:   12/23/17 1322 12/23/17 1432 12/23/17 1508 12/23/17 1720  BP: 122/65  125/62   Pulse: 75  73   Resp: (!) 32  (!) 26   Temp: (!) 100.8 F (38.2 C) 99.8 F (37.7 C)  99.6 F (37.6 C)  TempSrc: Oral Oral  Oral  SpO2: 93%     Weight:      Height:        Intake/Output Summary (Last 24 hours) at 12/23/2017  [place] filed at 12/23/2017 1600 Gross per 24 hour  Intake 1303.68 ml  Output 275 ml  Net 1028.68 ml   Filed Weights   12/21/17 2110  Weight: 83.6 kg (184 lb 4.9 oz)   General: Alert, Awake and Oriented to Time, Place and Person. Appear in marked distress, affect appropriate Eyes: PERRL, Conjunctiva normal ENT: Oral Mucosa clear moist. Neck: no JVD,  no Abnormal Mass Or lumps Cardiovascular: S1 and S2 Present, no Murmur, Peripheral Pulses Present Respiratory: normal respiratory effort, Bilateral Air entry equal and Decreased, no use of accessory muscle, Clear to Auscultation, no Crackles, no wheezes Abdomen: Bowel Sound present, Soft and moderate tenderness, no hernia Skin: no redness, no Rash, no induration Extremities: no Pedal edema, no calf tenderness Neurologic: Grossly no focal neuro deficit. Bilaterally Equal motor strength  Data Reviewed: CBC: Recent Labs  Lab 12/21/17 1633 12/22/17 0043 12/23/17 0442  WBC 11.6* 10.5 16.2*  NEUTROABS  --   --  12.4*  HGB 15.0 15.4 14.6  HCT 43.3 45.1 42.3  MCV 91.4 91.3 90.4  PLT 264 241 034   Basic Metabolic Panel: Recent Labs  Lab 12/21/17 1633 12/22/17 0043 12/23/17 0442  NA 139 140 135  K 3.8 3.5 3.9  CL 103 105 98  CO2 28 27 27   GLUCOSE 131* 141* 139*  BUN 16 14 12   CREATININE 1.49* 1.38* 1.58*  CALCIUM 9.0 8.8* 8.5*    Liver Function Tests: Recent Labs  Lab 12/21/17 1633  AST 24  ALT 34  ALKPHOS 42  BILITOT 0.7  PROT 7.2  ALBUMIN 4.1   Recent Labs  Lab 12/21/17 1633  LIPASE 57*   No results for input(s): AMMONIA in the last 168 hours. Coagulation Profile: Recent Labs  Lab 12/21/17 1918  INR 1.16   Cardiac Enzymes: No results for input(s): CKTOTAL, CKMB, CKMBINDEX, TROPONINI in the last 168 hours. BNP (last 3 results) No results for input(s): PROBNP in the last 8760 hours. CBG: No results for input(s): GLUCAP in the last 168 hours. Studies: No results found.  Scheduled Meds: . budesonide-formoterol  2 puff Inhalation BID  . chlorhexidine  15 mL Mouth Rinse BID  . [START ON 12/24/2017] isosorbide-hydrALAZINE  2 tablet Oral BID  . mouth rinse  15 mL Mouth Rinse q12n4p  . metoprolol succinate  50 mg Oral Daily  . polyethylene glycol  17 g Oral Daily  . ranolazine  500 mg Oral Daily  . sotalol  80 mg Oral Daily   Continuous Infusions: . sodium  chloride Stopped (12/23/17 1321)  . piperacillin-tazobactam (ZOSYN)  IV 12.5 mL/hr at 12/23/17 1600   PRN Meds: sodium chloride, acetaminophen, albuterol, morphine injection, ondansetron **OR** ondansetron (ZOFRAN) IV  Time spent: 35 minutes  Author: Berle Mull, MD [facility] Pager: [phone] 12/23/2017 5:39 PM  If 7PM-7AM, please contact night-coverage at [email or web address], password [email or web address]

## 2017-12-23 NOTE — Progress Notes (Signed)
Assessment & Plan: HD#3 - perforated acute appendicitis on anticoagulation             WBC worse on IV abx - 16K this AM             Continue clear liquid diet today - well tolerated             Encouraged OOB, ambulation             Continue to hold Eliquis             Continue to await cardiology evaluation - CHF, pacer/defibrillator  Ordered repeat CT scan for Monday 8/5.  Will make decision regarding IR drainage procedure versus operative intervention after reviewing CT results with Dr. Dalbert Batman on Monday.  Will follow with you.         Earnstine Regal, MD, [facility]       Office: [phone]   Chief Complaint: Perforated appendicitis  Subjective: "Sore".  About the same pain in RLQ.  Tolerating clear liquid diet.  Discussed with family.  Objective: Vital signs in last 24 hours: Temp:  [98.7 F (37.1 C)-100.6 F (38.1 C)] 98.7 F (37.1 C) (08/04 0412) Pulse Rate:  [59-75] 59 (08/04 0412) Resp:  [15-20] 18 (08/04 0412) BP: (122-133)/(54-71) 122/59 (08/04 0412) SpO2:  [92 %-97 %] 97 % (08/04 0412) Last BM Date: 12/20/17  Intake/Output from previous day: 08/03 0701 - 08/04 0700 In: 774.2 [P.O.:550; I.V.:26.8; IV Piggyback:197.4] Out: -  Intake/Output this shift: No intake/output data recorded.  Physical Exam: HEENT - sclerae clear, mucous membranes moist Neck - soft Chest - clear bilaterally Cor - RRR Abdomen - mild distension; BS present; tender RLQ with guarding Ext - no edema, non-tender Neuro - alert & oriented, no focal deficits  Lab Results:  Recent Labs    12/22/17 0043 12/23/17 0442  WBC 10.5 16.2*  HGB 15.4 14.6  HCT 45.1 42.3  PLT 241 210   BMET Recent Labs    12/22/17 0043 12/23/17 0442  NA 140 135  K 3.5 3.9  CL 105 98  CO2 27 27  GLUCOSE 141* 139*  BUN 14 12  CREATININE 1.38* 1.58*  CALCIUM 8.8* 8.5*   PT/INR Recent Labs    12/21/17 1918  LABPROT 14.7  INR 1.16   Comprehensive  Metabolic Panel:    Component Value Date/Time   NA 135 12/23/2017 0442   NA 140 12/22/2017 0043   NA 139 08/13/2017 1019   NA 138 02/12/2017   K 3.9 12/23/2017 0442   K 3.5 12/22/2017 0043   CL 98 12/23/2017 0442   CL 105 12/22/2017 0043   CO2 27 12/23/2017 0442   CO2 27 12/22/2017 0043   BUN 12 12/23/2017 0442   BUN 14 12/22/2017 0043   BUN 23 08/13/2017 1019   BUN 15 02/12/2017   CREATININE 1.58 (H) 12/23/2017 0442   CREATININE 1.38 (H) 12/22/2017 0043   CREATININE 1.86 (H) 01/26/2016 1144   CREATININE 1.72 (H) 07/19/2015 1551   GLUCOSE 139 (H) 12/23/2017 0442   GLUCOSE 141 (H) 12/22/2017 0043   CALCIUM 8.5 (L) 12/23/2017 0442   CALCIUM 8.8 (L) 12/22/2017 0043   AST 24 12/21/2017 1633   AST 23 03/09/2017 1540   ALT 34 12/21/2017 1633   ALT 21 03/09/2017 1540   ALKPHOS 42 12/21/2017 1633   ALKPHOS 56 03/09/2017 1540   BILITOT 0.7 12/21/2017 1633  BILITOT 0.6 03/09/2017 1540   BILITOT 0.3 07/12/2016 1024   PROT 7.2 12/21/2017 1633   PROT 7.0 03/09/2017 1540   PROT 7.1 07/12/2016 1024   ALBUMIN 4.1 12/21/2017 1633   ALBUMIN 4.1 03/09/2017 1540   ALBUMIN 4.7 07/12/2016 1024    Studies/Results: Ct Abdomen Pelvis W Contrast  Result Date: 12/21/2017 CLINICAL DATA:  RIGHT lower quadrant pain. Seen 2 days ago given Zofran. EXAM: CT ABDOMEN AND PELVIS WITH CONTRAST TECHNIQUE: Multidetector CT imaging of the abdomen and pelvis was performed using the standard protocol following bolus administration of intravenous contrast. CONTRAST:  160mL ISOVUE-300 IOPAMIDOL (ISOVUE-300) INJECTION 61% COMPARISON:  None. FINDINGS: Lower chest: Emphysematous change lung bases. Cardiomegaly with pacemaker. Hepatobiliary: Multiple simple hepatic cysts. No gallstones or biliary ductal dilatation. No solid hepatic mass. Pancreas: Normal. Spleen: Normal. Adrenals/Urinary Tract: Renal cystic disease. No hydronephrosis. Normal adrenal glands. Stomach/Bowel: Stomach is normal. Mild small bowel ileus.  There is acute appendicitis with an enlarged thick-walled appendix, and multiple appendicoliths. There is a localized perforation in the RIGHT lower quadrant with free fluid inferolaterally. No pneumoperitoneum. Vascular/Lymphatic: Aortic atherosclerosis. Reproductive: Prostate is unremarkable. Other: No abdominal wall hernia or abnormality. No abdominopelvic ascites. Musculoskeletal: No acute or significant osseous findings. IMPRESSION: Acute appendicitis, multiple appendicoliths, and localized perforation with free fluid; no pneumoperitoneum. Localized ileus. These results were called by telephone at the time of interpretation on 12/21/2017 at 6:40 pm to Dr. Nanda Quinton , who verbally acknowledged these results. Electronically Signed   By: Staci Righter [name].D.   On: 12/21/2017 18:42      [name] 12/23/2017  [name], male   DOB: Jan 26, 1946, 72 y.o.   MRN: [id number]

## 2017-12-23 NOTE — Progress Notes (Signed)
pt had 6 beats V-tach. asymptomatic. VS stable, BP 125-62, Temp - 99.8. MD notified.  Barbee Shropshire. Brigitte Pulse, RN

## 2017-12-23 NOTE — Plan of Care (Signed)

## 2017-12-23 NOTE — Progress Notes (Signed)
Pt has CPAP from home.  RT inspected for frayed cords and wires, none were found.  Pt's machine is working properly at this time.  Pt states that he will self administer when ready for bed.  RT to monitor and assess as needed.

## 2017-12-24 ENCOUNTER — Encounter (HOSPITAL_COMMUNITY): Payer: Self-pay | Admitting: Radiology

## 2017-12-24 ENCOUNTER — Encounter (HOSPITAL_COMMUNITY)
Admission: EM | Disposition: A | Discharge status: Home / Self Care | Payer: Self-pay | Source: Home / Self Care | Attending: Family Medicine

## 2017-12-24 ENCOUNTER — Inpatient Hospital Stay (HOSPITAL_COMMUNITY): Payer: Medicare Other | Admitting: Registered Nurse

## 2017-12-24 ENCOUNTER — Inpatient Hospital Stay (HOSPITAL_COMMUNITY): Payer: Medicare Other

## 2017-12-24 DIAGNOSIS — I5022 Chronic systolic (congestive) heart failure: Secondary | ICD-10-CM

## 2017-12-24 DIAGNOSIS — I10 Essential (primary) hypertension: Secondary | ICD-10-CM

## 2017-12-24 DIAGNOSIS — I420 Dilated cardiomyopathy: Secondary | ICD-10-CM

## 2017-12-24 DIAGNOSIS — I480 Paroxysmal atrial fibrillation: Secondary | ICD-10-CM

## 2017-12-24 DIAGNOSIS — I472 Ventricular tachycardia: Secondary | ICD-10-CM

## 2017-12-24 HISTORY — PX: LAPAROSCOPIC APPENDECTOMY: SHX408

## 2017-12-24 LAB — BASIC METABOLIC PANEL
Anion gap: 8 (ref 5–15)
BUN: 11 mg/dL (ref 8–23)
CALCIUM: 8.8 mg/dL — AB (ref 8.9–10.3)
CHLORIDE: 98 mmol/L (ref 98–111)
CO2: 29 mmol/L (ref 22–32)
Creatinine, Ser: 1.38 mg/dL — ABNORMAL HIGH (ref 0.61–1.24)
GFR calc Af Amer: 58 mL/min — ABNORMAL LOW (ref >60)
GFR calc non Af Amer: 50 mL/min — ABNORMAL LOW (ref >60)
GLUCOSE: 126 mg/dL — AB (ref 70–99)
Potassium: 4.3 mmol/L (ref 3.5–5.1)
Sodium: 135 mmol/L (ref 135–145)

## 2017-12-24 LAB — CBC
HEMATOCRIT: 44.9 % (ref 39.0–52.0)
HEMOGLOBIN: 15.5 g/dL (ref 13.0–17.0)
MCH: 31.6 pg (ref 26.0–34.0)
MCHC: 34.5 g/dL (ref 30.0–36.0)
MCV: 91.6 fL (ref 78.0–100.0)
Platelets: 212 10*3/uL (ref 150–400)
RBC: 4.9 MIL/uL (ref 4.22–5.81)
RDW: 13.5 % (ref 11.5–15.5)
WBC: 16.2 10*3/uL — ABNORMAL HIGH (ref 4.0–10.5)

## 2017-12-24 LAB — MRSA PCR SCREENING: MRSA BY PCR: NEGATIVE

## 2017-12-24 SURGERY — APPENDECTOMY, LAPAROSCOPIC
Anesthesia: General | Site: Abdomen

## 2017-12-24 MED ORDER — LACTATED RINGERS IR SOLN
Status: DC | PRN
  Administered 2017-12-24: 1000 mL

## 2017-12-24 MED ORDER — PROMETHAZINE HCL 25 MG/ML IJ SOLN: 6.2500 mg | INTRAMUSCULAR | Status: DC | PRN

## 2017-12-24 MED ORDER — SUGAMMADEX SODIUM 200 MG/2ML IV SOLN
INTRAVENOUS | Status: DC | PRN
  Administered 2017-12-24: 200 mg via INTRAVENOUS

## 2017-12-24 MED ORDER — BUPIVACAINE-EPINEPHRINE (PF) 0.5% -1:200000 IJ SOLN
INTRAMUSCULAR | Status: AC
  Filled 2017-12-24: qty 30

## 2017-12-24 MED ORDER — BUPIVACAINE-EPINEPHRINE 0.5% -1:200000 IJ SOLN
INTRAMUSCULAR | Status: DC | PRN
  Administered 2017-12-24: 13 mL

## 2017-12-24 MED ORDER — MEPERIDINE HCL 50 MG/ML IJ SOLN: 6.2500 mg | INTRAMUSCULAR | Status: DC | PRN

## 2017-12-24 MED ORDER — PROPOFOL 10 MG/ML IV BOLUS
INTRAVENOUS | Status: AC
  Filled 2017-12-24: qty 20

## 2017-12-24 MED ORDER — LIDOCAINE 2% (20 MG/ML) 5 ML SYRINGE
INTRAMUSCULAR | Status: AC
  Filled 2017-12-24: qty 5

## 2017-12-24 MED ORDER — 0.9 % SODIUM CHLORIDE (POUR BTL) OPTIME
TOPICAL | Status: DC | PRN
  Administered 2017-12-24: 1000 mL

## 2017-12-24 MED ORDER — ONDANSETRON HCL 4 MG/2ML IJ SOLN
INTRAMUSCULAR | Status: AC
  Filled 2017-12-24: qty 2

## 2017-12-24 MED ORDER — SUCCINYLCHOLINE CHLORIDE 20 MG/ML IJ SOLN
INTRAMUSCULAR | Status: DC | PRN
  Administered 2017-12-24: 120 mg via INTRAVENOUS

## 2017-12-24 MED ORDER — ONDANSETRON HCL 4 MG/2ML IJ SOLN
INTRAMUSCULAR | Status: DC | PRN
  Administered 2017-12-24: 4 mg via INTRAVENOUS

## 2017-12-24 MED ORDER — FENTANYL CITRATE (PF) 100 MCG/2ML IJ SOLN
INTRAMUSCULAR | Status: DC | PRN
  Administered 2017-12-24 (×2): 50 ug via INTRAVENOUS

## 2017-12-24 MED ORDER — ROCURONIUM BROMIDE 10 MG/ML (PF) SYRINGE
PREFILLED_SYRINGE | INTRAVENOUS | Status: AC
  Filled 2017-12-24: qty 10

## 2017-12-24 MED ORDER — FENTANYL CITRATE (PF) 100 MCG/2ML IJ SOLN
25.0000 ug | INTRAMUSCULAR | Status: DC | PRN
  Administered 2017-12-24 (×2): 25 ug via INTRAVENOUS

## 2017-12-24 MED ORDER — ROCURONIUM BROMIDE 10 MG/ML (PF) SYRINGE
PREFILLED_SYRINGE | INTRAVENOUS | Status: DC | PRN
  Administered 2017-12-24: 40 mg via INTRAVENOUS
  Administered 2017-12-24: 10 mg via INTRAVENOUS

## 2017-12-24 MED ORDER — ETOMIDATE 2 MG/ML IV SOLN
INTRAVENOUS | Status: DC | PRN
  Administered 2017-12-24: 20 mg via INTRAVENOUS

## 2017-12-24 MED ORDER — LACTATED RINGERS IV SOLN
INTRAVENOUS | Status: DC
  Administered 2017-12-24: 19:00:00 via INTRAVENOUS

## 2017-12-24 MED ORDER — LACTATED RINGERS IV SOLN
INTRAVENOUS | Status: DC
  Administered 2017-12-24: 14:00:00 via INTRAVENOUS

## 2017-12-24 MED ORDER — FENTANYL CITRATE (PF) 250 MCG/5ML IJ SOLN
INTRAMUSCULAR | Status: AC
  Filled 2017-12-24: qty 5

## 2017-12-24 MED ORDER — FENTANYL CITRATE (PF) 100 MCG/2ML IJ SOLN
INTRAMUSCULAR | Status: AC
  Administered 2017-12-24: 25 ug via INTRAVENOUS
  Filled 2017-12-24: qty 2

## 2017-12-24 SURGICAL SUPPLY — 52 items
ADH SKN CLS APL DERMABOND .7 (GAUZE/BANDAGES/DRESSINGS) ×1
APPLIER CLIP ROT 10 11.4 M/L (STAPLE)
APR CLP MED LRG 11.4X10 (STAPLE)
BAG SPEC RTRVL LRG 6X4 10 (ENDOMECHANICALS) ×1
CABLE HIGH FREQUENCY MONO STRZ (ELECTRODE) IMPLANT
CHLORAPREP W/TINT 26ML (MISCELLANEOUS) ×2 IMPLANT
CLIP APPLIE ROT 10 11.4 M/L (STAPLE) IMPLANT
COVER SURGICAL LIGHT HANDLE (MISCELLANEOUS) ×2 IMPLANT
CUTTER FLEX LINEAR 45M (STAPLE) ×1 IMPLANT
DECANTER SPIKE VIAL GLASS SM (MISCELLANEOUS) ×2 IMPLANT
DERMABOND ADVANCED (GAUZE/BANDAGES/DRESSINGS) ×1
DERMABOND ADVANCED .7 DNX12 (GAUZE/BANDAGES/DRESSINGS) ×1 IMPLANT
DEVICE TROCAR PUNCTURE CLOSURE (ENDOMECHANICALS) IMPLANT
DRAIN CHANNEL 19F RND (DRAIN) ×1 IMPLANT
DRAPE LAPAROSCOPIC ABDOMINAL (DRAPES) ×2 IMPLANT
ELECT REM PT RETURN 15FT ADLT (MISCELLANEOUS) ×2 IMPLANT
ENDOLOOP SUT PDS II  0 18 (SUTURE)
ENDOLOOP SUT PDS II 0 18 (SUTURE) IMPLANT
EVACUATOR SILICONE 100CC (DRAIN) ×1 IMPLANT
GAUZE SPONGE 2X2 8PLY STRL LF (GAUZE/BANDAGES/DRESSINGS) IMPLANT
GLOVE BIOGEL PI IND STRL 7.5 (GLOVE) IMPLANT
GLOVE BIOGEL PI IND STRL 8 (GLOVE) IMPLANT
GLOVE BIOGEL PI INDICATOR 7.5 (GLOVE) ×3
GLOVE BIOGEL PI INDICATOR 8 (GLOVE) ×1
GLOVE ECLIPSE 7.5 STRL STRAW (GLOVE) ×1 IMPLANT
GLOVE EUDERMIC 7 POWDERFREE (GLOVE) ×2 IMPLANT
GLOVE SURG SS PI 7.5 STRL IVOR (GLOVE) ×1 IMPLANT
GOWN STRL REUS W/ TWL XL LVL3 (GOWN DISPOSABLE) IMPLANT
GOWN STRL REUS W/TWL 2XL LVL3 (GOWN DISPOSABLE) ×1 IMPLANT
GOWN STRL REUS W/TWL XL LVL3 (GOWN DISPOSABLE) ×6 IMPLANT
HANDLE STAPLE EGIA 4 XL (STAPLE) IMPLANT
KIT BASIN OR (CUSTOM PROCEDURE TRAY) ×2 IMPLANT
POUCH SPECIMEN RETRIEVAL 10MM (ENDOMECHANICALS) ×2 IMPLANT
RELOAD 45 VASCULAR/THIN (ENDOMECHANICALS) IMPLANT
RELOAD STAPLE 45 2.5 WHT GRN (ENDOMECHANICALS) IMPLANT
RELOAD STAPLE 45 3.5 BLU ETS (ENDOMECHANICALS) IMPLANT
RELOAD STAPLE TA45 3.5 REG BLU (ENDOMECHANICALS) ×2 IMPLANT
SET IRRIG TUBING LAPAROSCOPIC (IRRIGATION / IRRIGATOR) ×2 IMPLANT
SHEARS HARMONIC ACE PLUS 36CM (ENDOMECHANICALS) ×2 IMPLANT
SPONGE GAUZE 2X2 STER 10/PKG (GAUZE/BANDAGES/DRESSINGS)
STRIP CLOSURE SKIN 1/2X4 (GAUZE/BANDAGES/DRESSINGS) IMPLANT
SUT ETHILON 3 0 PS 1 (SUTURE) ×1 IMPLANT
SUT MNCRL AB 4-0 PS2 18 (SUTURE) ×2 IMPLANT
SUT VICRYL 0 UR6 27IN ABS (SUTURE) IMPLANT
TOWEL OR 17X26 10 PK STRL BLUE (TOWEL DISPOSABLE) ×2 IMPLANT
TOWEL OR NON WOVEN STRL DISP B (DISPOSABLE) ×2 IMPLANT
TRAY FOLEY MTR SLVR 16FR STAT (SET/KITS/TRAYS/PACK) ×2 IMPLANT
TRAY LAPAROSCOPIC (CUSTOM PROCEDURE TRAY) ×2 IMPLANT
TROCAR BLADELESS OPT 5 100 (ENDOMECHANICALS) ×2 IMPLANT
TROCAR XCEL 12X100 BLDLESS (ENDOMECHANICALS) ×2 IMPLANT
TROCAR XCEL BLUNT TIP 100MML (ENDOMECHANICALS) ×2 IMPLANT
TUBING INSUF HEATED (TUBING) ×2 IMPLANT

## 2017-12-24 NOTE — Progress Notes (Signed)
Dr. Smith Robert stated that ICD would be taken care of in OR due to no orders in chart.

## 2017-12-24 NOTE — Progress Notes (Signed)
Patient back from surgery, alert and drowsy, denies any pain/distress. Surgical incision clean/dry/intact, vital WNL, Patient in bed resting family at the bedside. Will continue to assess patient.

## 2017-12-24 NOTE — Anesthesia Procedure Notes (Signed)
Procedure Name: Intubation Performed by: Gean Maidens, CRNA Pre-anesthesia Checklist: Patient identified, Emergency Drugs available, Suction available, Patient being monitored and Timeout performed Patient Re-evaluated:Patient Re-evaluated prior to induction Oxygen Delivery Method: Circle system utilized Preoxygenation: Pre-oxygenation with 100% oxygen Induction Type: IV induction and Rapid sequence Laryngoscope Size: Mac and 4 Grade View: Grade I Tube type: Oral Tube size: 7.5 mm Number of attempts: 1 Airway Equipment and Method: Stylet Placement Confirmation: ETT inserted through vocal cords under direct vision,  CO2 detector and breath sounds checked- equal and bilateral Secured at: 23 cm Tube secured with: Tape Dental Injury: Teeth and Oropharynx as per pre-operative assessment
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
## 2017-12-24 NOTE — Progress Notes (Signed)
[facility] Progress Note  Patient: [name] NGE:[id number]   PCP: Dorothyann Peng, NP DOB: 08/19/1945   DOA: 12/21/2017   DOS: 12/24/2017   Date of Service: the patient was seen and examined on 12/24/2017  Subjective: Pain is well controlled.  No nausea no vomiting.  No fever no chills.  Passing gas.  Brief hospital course: Pt. with PMH of A. fib on Eliquis, chronic systolic CHF, EF 40%, ICD implant, COPD, recurrent VT S/P ablation, seizure disorder; admitted on 12/21/2017, presented with complaint of abdominal pain, was found to have acute appendicitis with mild perforation. Currently further plan is continue conservative management.  Assessment and Plan: 1.  Acute appendicitis. Mild perforation. On anticoagulation. General surgery consulted who recommended medicine admission and conservative management. Cardiology consulted for preop clearance. Patient is on IV antibiotics, clear liquid diet, passing gas. Eliquis was on hold for possible procedure. Repeat CT scan performed today, patient taken to the OR for laparoscopic appendectomy at high risk surgery.  Will monitor postoperative recovery. Appreciate their assistance. Continue pain management morphine every 2 hours.   2.  Chronic systolic CHF. Recurrent VT. NSVT. S/P AICD implant.  And chronic A. fib on chronic anticoagulation Cardiology consult for preop clearance. Follows up with heart failure in EP clinic here.  Patient's family is in process of getting a second opinion at [facility]. Continue Toprol-XL per family patient takes 25 and 50 mg. Continue BiDil as well. Holding Drayton. Also holding Lasix. Aldactone is already on hold due to gynecomastia. Avoiding aggressive IV hydration since the patient's blood pressure is better as well as renal function stable. Holding anti-coagulation for now.  Cardiology consulted,?  Bridging with heparin but currently on hold until CT scan is done. Continue sotalol, patient is on   mexiletine as well. Continue Ranexa. Rechecking BMP and mag. Per EDP note patient may require repeat ablation for recurrent VT.  3.  COPD. OSA. No evidence of ILD per pulmonary. Continue Symbicort and rescue inhalers.  4.  Chronic kidney disease stage III. Renal function stable at baseline.  Monitor daily BMP.  Diet: diet per surgery DVT Prophylaxis: subcutaneous Heparin  Advance goals of care discussion: full code  Family Communication: family was present at bedside, at the time of interview. The pt provided permission to discuss medical plan with the family. Opportunity was given to ask question and all questions were answered satisfactorily.   Disposition:  Discharge to home.  Consultants: General surgery, cardiology  Procedures: none  Antibiotics: Anti-infectives (From admission, onward)   Start     Dose/Rate Route Frequency Ordered Stop   12/22/17 0400  [MAR Hold]  piperacillin-tazobactam (ZOSYN) IVPB 3.375 g     (MAR Hold since Mon 12/24/2017 at 1421. Reason: Transfer to a Procedural area.)   3.375 g 12.5 mL/hr over 240 Minutes Intravenous Every 8 hours 12/21/17 2035     12/21/17 1845  piperacillin-tazobactam (ZOSYN) IVPB 3.375 g     3.375 g 100 mL/hr over 30 Minutes Intravenous  Once 12/21/17 1841 12/21/17 1950       Objective: Physical Exam: Vitals:   12/24/17 1730 12/24/17 1745 12/24/17 1800 12/24/17 1815  BP: 112/77 (!) 147/69 125/67 124/71  Pulse: 77 79 70 76  Resp: 17 17 12 13   Temp:    97.8 F (36.6 C)  TempSrc:      SpO2: 100% 100% 98% 99%  Weight:      Height:        Intake/Output Summary (Last 24 hours) at 12/24/2017 1826  Last data filed at 12/24/2017 1729 Gross per 24 hour  Intake 237.57 ml  Output 1125 ml  Net -887.43 ml   Filed Weights   12/21/17 2110 12/24/17 1421  Weight: 83.6 kg (184 lb 4.9 oz) 83.5 kg (184 lb)   General: Alert, Awake and Oriented to Time, Place and Person. Appear in marked distress, affect appropriate Eyes: PERRL,  Conjunctiva normal ENT: Oral Mucosa clear moist. Neck: no JVD, no Abnormal Mass Or lumps Cardiovascular: S1 and S2 Present, no Murmur, Peripheral Pulses Present Respiratory: normal respiratory effort, Bilateral Air entry equal and Decreased, no use of accessory muscle, Clear to Auscultation, no Crackles, no wheezes Abdomen: Bowel Sound present, Soft and moderate tenderness, no hernia Skin: no redness, no Rash, no induration Extremities: no Pedal edema, no calf tenderness Neurologic: Grossly no focal neuro deficit. Bilaterally Equal motor strength  Data Reviewed: CBC: Recent Labs  Lab 12/21/17 1633 12/22/17 0043 12/23/17 0442 12/24/17 0709  WBC 11.6* 10.5 16.2* 16.2*  NEUTROABS  --   --  12.4*  --   HGB 15.0 15.4 14.6 15.5  HCT 43.3 45.1 42.3 44.9  MCV 91.4 91.3 90.4 91.6  PLT 264 241 210 259   Basic Metabolic Panel: Recent Labs  Lab 12/21/17 1633 12/22/17 0043 12/23/17 0442 12/23/17 1838 12/24/17 0709  NA 139 140 135 133* 135  K 3.8 3.5 3.9 3.9 4.3  CL 103 105 98 97* 98  CO2 28 27 27 26 29   GLUCOSE 131* 141* 139* 140* 126*  BUN 16 14 12 11 11   CREATININE 1.49* 1.38* 1.58* 1.60* 1.38*  CALCIUM 9.0 8.8* 8.5* 8.4* 8.8*  MG  --   --   --  1.7  --     Liver Function Tests: Recent Labs  Lab 12/21/17 1633  AST 24  ALT 34  ALKPHOS 42  BILITOT 0.7  PROT 7.2  ALBUMIN 4.1   Recent Labs  Lab 12/21/17 1633  LIPASE 57*   No results for input(s): AMMONIA in the last 168 hours. Coagulation Profile: Recent Labs  Lab 12/21/17 1918  INR 1.16   Cardiac Enzymes: No results for input(s): CKTOTAL, CKMB, CKMBINDEX, TROPONINI in the last 168 hours. BNP (last 3 results) No results for input(s): PROBNP in the last 8760 hours. CBG: No results for input(s): GLUCAP in the last 168 hours. Studies: Ct Abdomen Pelvis Wo Contrast  Result Date: 12/24/2017 CLINICAL DATA:  Right lower quadrant abdominal pain. Appendiceal abscess. EXAM: CT ABDOMEN AND PELVIS WITHOUT CONTRAST  TECHNIQUE: Multidetector CT imaging of the abdomen and pelvis was performed following the standard protocol without IV contrast. COMPARISON:  12/21/2017 FINDINGS: Lower chest: Increased dependent atelectasis in both lower lobes. Mild lingular atelectasis or scarring. Dual lead pacer. Small anterior pericardial effusion, increased from previous. Hepatobiliary: Stable hypodense hepatic lesions. Trace perihepatic ascites, increased from previous. Dependent density in the gallbladder, likely sludge. Pancreas: Unremarkable Spleen: Trace perisplenic ascites. Adrenals/Urinary Tract: Adrenal glands normal. Scarring in the right kidney upper pole, versus less likely angiomyolipoma. Right mid kidney fluid density lesion compatible with cyst. Potential 1 mm right kidney upper pole nonobstructive renal calculus on image 68/4. Stomach/Bowel: There is appendiceal wall thickening up to 1.2 cm, with appendicolith along the appendiceal tip which is indistinct and more distended than the rest of the appendix. Localized perforation is a distinct possibility, but no extraluminal gas is identified. There is an increased amount of fluid density tracking along tissue planes in the vicinity of the appendix along with mesenteric edema. There is  wall thickening in distal small bowel loops in the vicinity of the appendix. Scattered air-fluid levels in nondilated small bowel loops. Vascular/Lymphatic: Aortoiliac atherosclerotic vascular disease. Small likely reactive pelvic and retroperitoneal lymph nodes. Reproductive: Unremarkable Other: Increased amount of free pelvic fluid. A small amount of pelvic fluid extends into the direct left inguinal hernia. This hernia also contains adipose tissue. Musculoskeletal: Congenitally short pedicles in the lumbar spine with mild spondylosis and degenerative disc disease resulting in impingement at L3-4 and borderline impingement at L4-5. Small umbilical hernia contains adipose tissue. In addition to the  direct left inguinal hernia there is a small indirect right inguinal hernia containing adipose tissues. IMPRESSION: 1. Acute appendicitis with the mid appendix about 1.5 cm in diameter, and the distal appendix containing appendicolith slightly more dilated but with indistinct margins. No extraluminal gas. There is an increased amount of ascites, primarily pelvic but with a small amount of perisplenic and perihepatic ascites. No well-defined drainable abscess on today's noncontrast exam. 2. Mildly increased dependent atelectasis in both lower lobes. Small anterior pericardial effusion, increased from prior. 3. Stable hypodense hepatic lesions, likely cysts. 4. Potential 1 mm right kidney upper pole nonobstructive renal calculus. Scarring in the right kidney upper pole as well. 5.  Aortic Atherosclerosis (ICD10-I70.0). 6. Mild impingement at L3-4. 7. Small direct left in indirect right inguinal hernias containing adipose tissue and a small amount of fluid. Electronically Signed   By: Van Clines M.D.   On: 12/24/2017 09:14    Scheduled Meds: . [MAR Hold] budesonide-formoterol  2 puff Inhalation BID  . [MAR Hold] chlorhexidine  15 mL Mouth Rinse BID  . [MAR Hold] isosorbide-hydrALAZINE  2 tablet Oral BID  . [MAR Hold] lacosamide  25 mg Oral BID  . [MAR Hold] mouth rinse  15 mL Mouth Rinse q12n4p  . [MAR Hold] metoprolol succinate  25 mg Oral Daily  . [MAR Hold] metoprolol succinate  50 mg Oral QPM  . [MAR Hold] mexiletine  150 mg Oral BID  . [MAR Hold] polyethylene glycol  17 g Oral Daily  . [MAR Hold] ranolazine  500 mg Oral Daily  . [MAR Hold] sotalol  80 mg Oral Daily  . [MAR Hold] tamsulosin  0.4 mg Oral QHS   Continuous Infusions: . [MAR Hold] sodium chloride Stopped (12/24/17 0422)  . lactated ringers 50 mL/hr at 12/24/17 1422  . lactated ringers    . [MAR Hold] piperacillin-tazobactam (ZOSYN)  IV 3.375 g (12/24/17 1151)   PRN Meds: [MAR Hold] sodium chloride, [MAR Hold]  acetaminophen, [MAR Hold] albuterol, fentaNYL (SUBLIMAZE) injection, [MAR Hold] gabapentin, meperidine (DEMEROL) injection, [MAR Hold]  morphine injection, [MAR Hold] ondansetron **OR** [MAR Hold] ondansetron (ZOFRAN) IV, promethazine  Time spent: 35 minutes  Author: Berle Mull, MD [facility] Pager: [phone] 12/24/2017 6:26 PM  If 7PM-7AM, please contact night-coverage at [email or web address], password [email or web address]

## 2017-12-24 NOTE — Interval H&P Note (Signed)
History and Physical Interval Note:  12/24/2017 2:19 PM  [name]  has presented today for surgery, with the diagnosis of appendicitis  The various methods of treatment have been discussed with the patient and family. After consideration of risks, benefits and other options for treatment, the patient has consented to  Procedure(s): APPENDECTOMY LAPAROSCOPIC, POSSIBLE OPEN APPENDECTOMY (N/A) as a surgical intervention .  The patient's history has been reviewed, patient examined, no change in status, stable for surgery.  I have reviewed the patient's chart and labs.  Questions were answered to the patient's satisfaction.     Adin Hector

## 2017-12-24 NOTE — Transfer of Care (Signed)
Immediate Anesthesia Transfer of Care Note  Patient: [name]  Procedure(s) Performed: APPENDECTOMY LAPAROSCOPIC (N/A Abdomen)  Patient Location: PACU  Anesthesia Type:General  Level of Consciousness: sedated, patient cooperative and responds to stimulation  Airway & Oxygen Therapy: Patient Spontanous Breathing and Patient connected to face mask oxygen  Post-op Assessment: Report given to RN and Post -op Vital signs reviewed and stable  Post vital signs: Reviewed and stable  Last Vitals:  Vitals Value Taken Time  BP 119/62 12/24/2017  5:15 PM  Temp    Pulse 79 12/24/2017  5:17 PM  Resp 18 12/24/2017  5:17 PM  SpO2 98 % 12/24/2017  5:17 PM  Vitals shown include unvalidated device data.  Last Pain:  Vitals:   12/24/17 1421  TempSrc:   PainSc: 9       Patients Stated Pain Goal: 4 (36/46/80 3212)  Complications: No apparent anesthesia complications

## 2017-12-24 NOTE — Op Note (Signed)
Patient Name:           [name]   Date of Surgery:        12/24/2017  Pre op Diagnosis:      Perforated appendicitis  Post op Diagnosis:    Perforated appendicitis with abscess  Procedure:                 Laparoscopic appendectomy  Surgeon:                     Edsel Petrin. Dalbert Batman, M.D., FACS  Assistant:                      OR staff  Operative Indications:   This is a 72 year old man who was admitted on August 2 with a four-day history of abdominal pain localized to the right lower quadrant with nausea and vomiting.  He denied fever but did admit to chills and sweats.  Seen initially by his PCP thought to have food poisoning but he did not improve and when he went back to his primary care provider he was sent to the emergency department.  CT scan at that time showed acute appendicitis with evidence of localized perforation.  Tiny amount of free fluid, multiple appendicoliths but no sign of free air or diffuse peritonitis. He has multiple comorbidities  including atrial fibrillation on chronic anticoagulation, congestive heart failure with ejection fraction approximately 25%, history of stroke, history CKD, history of myocardial infarction, history of hypertension, history of seizures.  AICD in place.  Prior periumbilical hernia repair.     He was admitted.  Placed on IV antibiotics and bowel rest, Eliquis was discontinued.  Unfortunately, he continued to have pain and leukocytosis.  CT scan today showed some free fluid which was new and continued evidence of acute appendicitis.  There was no drainable abscess.  He was seen by cardiology who felt that he was at high risk for perioperative complications but there did not seem to be any alternative to proceeding with appendectomy and abdominal exploration.  He is brought to the operating room for surgery.  Patient is family are well aware of the very high risk here  Operative Findings:       The patient had acute appendicitis.  It had ruptured  and there was 2 fecaliths lateral to the appendix up against the abdominal wall.  These were retrieved.  There was a small purulent abscess.  He had some serosanguineous fluid throughout his abdomen which did not look infected.  This was irrigated and removed.  We were ultimately able to tease the tissues back and identify the junction of the appendix with the cecum and staple off the appendix at that location and then remove the appendix slowly.  There was a lot of inflammatory change around the distal 2 or 3 feet of small bowel which had to be teased away from the abdominal wall but there is no sign of any ischemia or perforation.  Following the induction of general endotracheal anesthesia a Foley catheter was placed.  The abdomen was prepped and draped in a sterile fashion.  Procedure in Detail:          The patient was already on broad-spectrum antibiotics.  Surgical timeout was performed.  0.5% Marcaine with epinephrine was used as a local infiltration anesthetic.  A vertical incision was made below the umbilicus.  The fascia was incised in the midline and the abdominal cavity entered under direct vision.  An 11 mm Hassan trocar was inserted and secured with a pursestring suture of 0 Vicryl.  Pneumoperitoneum was created and video camera was inserted.  A 12 mm trocar was placed in the left lower quadrant under direct vision.  A 5 mm trocar was placed in the right upper quadrant under direct vision.  We irrigated out all of the serosanguineous fluid.  We position the patient and we were able to slowly identified the cecum and the appendix which was adherent to the right lateral sidewall.  We teased a couple of loops of small bowel away that had some secondary inflammatory adhesions.  The to think the labs that were present were removed with a stone scooper.  We took the dissection to the base of the appendix where the appendix inserted into the cecum and we found that the appendix was very soft and not  inflamed in this area.  I created a window behind the appendix and placed an Endo GIA stapler with a 2.5 mm load across the base of the appendix at the cecum.  Stapler was fired and removed.  The staple line looked good and was inspected several times.  The appendix was then further dissected distally away from the abdominal wall and the appendiceal mesentery was divided with harmonic scalpel staying fairly close to the appendix.  The appendix was placed in a specimen bag and removed and sent to pathology.  I further irrigated out the operative field.  We removed all the fluid.  There did not appear to be any significant injury to the small bowel or cecum.  I placed a 66 [place] Blake drain in the right paracolic gutter and brought that out through the right upper quadrant trocar site, suturing it to the skin with nylon and connected to a suction bulb.  The pneumoperitoneum was released.  The trochars were removed.  The fascia at the umbilicus was closed with 0 Vicryl sutures.  The skin incisions were closed with skin staples and subcuticular 4-0 Monocryl.  Dry bandages were placed and the patient taken to PACU in stable condition.  EBL 25 cc or less.  Counts correct.  Complications none.     Edsel Petrin. Dalbert Batman, M.D., FACS General and Minimally Invasive Surgery Breast and Colorectal Surgery  12/24/2017 5:06 PM

## 2017-12-24 NOTE — Progress Notes (Signed)
Dr Smith Robert gave order to have device interrogated postop. [employer] paged and rep [employer] returning call. He said since magnet is off, device does not need to be interrogated. Dr Smith Robert made aware of what rep said.

## 2017-12-24 NOTE — Progress Notes (Signed)
[name], male   DOB: February 08, 1946, 72 y.o.   MRN: [id number]       Subjective: Patient just got some morphine.  Pain is no better than when he was admitted.  Drinking just liquids makes his pain worse.    Objective: Vital signs in last 24 hours: Temp:  [97.7 F (36.5 C)-100.8 F (38.2 C)] 97.7 F (36.5 C) (08/05 0621) Pulse Rate:  [63-85] 63 (08/05 0621) Resp:  [14-32] 14 (08/05 0621) BP: (122-127)/(62-76) 127/76 (08/05 0621) SpO2:  [86 %-95 %] 95 % (08/05 0621) Last BM Date: 12/20/17  Intake/Output from previous day: 08/04 0701 - 08/05 0700 In: 917.1 [P.O.:720; I.V.:60.8; IV Piggyback:136.2] Out: 975 [Urine:975] Intake/Output this shift: Total I/O In: -  Out: 400 [Urine:400]  PE: Heart: almost brady, seems regular Lungs: CTAB Abd: soft, diffusely tender, but greatest in RLQ with voluntary guarding, hypoactive BS, mild bloating  Lab Results:  Recent Labs    12/23/17 0442 12/24/17 0709  WBC 16.2* 16.2*  HGB 14.6 15.5  HCT 42.3 44.9  PLT 210 212   BMET Recent Labs    12/23/17 0442 12/23/17 1838  NA 135 133*  K 3.9 3.9  CL 98 97*  CO2 27 26  GLUCOSE 139* 140*  BUN 12 11  CREATININE 1.58* 1.60*  CALCIUM 8.5* 8.4*   PT/INR Recent Labs    12/21/17 1918  LABPROT 14.7  INR 1.16   CMP     Component Value Date/Time   NA 133 (L) 12/23/2017 1838   NA 139 08/13/2017 1019   K 3.9 12/23/2017 1838   CL 97 (L) 12/23/2017 1838   CO2 26 12/23/2017 1838   GLUCOSE 140 (H) 12/23/2017 1838   BUN 11 12/23/2017 1838   BUN 23 08/13/2017 1019   CREATININE 1.60 (H) 12/23/2017 1838   CREATININE 1.86 (H) 01/26/2016 1144   CALCIUM 8.4 (L) 12/23/2017 1838   PROT 7.2 12/21/2017 1633   PROT 7.1 07/12/2016 1024   ALBUMIN 4.1 12/21/2017 1633   ALBUMIN 4.7 07/12/2016 1024   AST 24 12/21/2017 1633   ALT 34 12/21/2017 1633   ALKPHOS 42 12/21/2017 1633   BILITOT 0.7 12/21/2017 1633   BILITOT 0.3 07/12/2016 1024   GFRNONAA 42 (L) 12/23/2017 1838   GFRAA  48 (L) 12/23/2017 1838   Lipase     Component Value Date/Time   LIPASE 57 (H) 12/21/2017 1633       Studies/Results: No results found.  Anti-infectives: Anti-infectives (From admission, onward)   Start     Dose/Rate Route Frequency Ordered Stop   12/22/17 0400  piperacillin-tazobactam (ZOSYN) IVPB 3.375 g     3.375 g 12.5 mL/hr over 240 Minutes Intravenous Every 8 hours 12/21/17 2035     12/21/17 1845  piperacillin-tazobactam (ZOSYN) IVPB 3.375 g     3.375 g 100 mL/hr over 30 Minutes Intravenous  Once 12/21/17 1841 12/21/17 1950       Assessment/Plan HD#4 - perforated acute appendicitis on anticoagulation -WBC still remains 16K.  Patient diffusely tender, greatest in RLQ. -repeat CT scan today -cards eval for preoperative clearance as he has a significant cardiac history -will likely require surgical intervention for his appendicitis, unless his CT scan shows a well-formed abscess that may be managed with a drain.  Afib on eliquis (on hold) CVA Seizure D/O OSA MI Ischemic cardiomyopathy  HTN COPD CHF AICD/Pacer in place     FEN - NPO VTE - SCDs/eliquis last taken on Friday ID - zosyn Dispo: await  cards eval, await CT scan, may likely need surgical intervention    LOS: 3 days    Henreitta Cea , [facility] 12/24/2017, 8:16 AM Pager: [phone]

## 2017-12-24 NOTE — H&P (View-Only) (Signed)
Surgery:  CT scan shows findings consistent with appendicitis and fecalith.  There is an increased amount of ascites of uncertain etiology but no well drainable abscess      because of continued pain and leukocytosis the patient will be taken to the operating room today for  Diagnostic laparoscopy, laparoscopic appendectomy, and indicated procedures Most likely he has appendicitis but there is a possibility of alternative diagnosis I have discussed the indications, details, techniques, and numerous risk of the surgery with him.  He is aware of the risk of bleeding, infection, Conversion to open laparotomy, alternative diagnosis causing his illness, injury to adjacent organs requiring major reconstructive surgery, wound healing problems such as hernia or dehiscence, cardiac pulmonary and thromboembolic problems.  He understands these issues well.  All of his questions were answered.  He agrees with this plan.   Edsel Petrin. Dalbert Batman, M.D., [facility] General and Minimally invasive Surgery Breast and Colorectal Surgery Office:   [phone] Pager:   [phone]

## 2017-12-24 NOTE — Care Management Important Message (Signed)
Important Message  Patient Details  Name: [name] MRN: [id number] Date of Birth: [birth year]   Medicare Important Message Given:  Yes    Kerin Salen 12/24/2017, 11:15 AMImportant Message  Patient Details  Name: [name] MRN: [id number] Date of Birth: 05-13-1946   Medicare Important Message Given:  Yes    Kerin Salen 12/24/2017, 11:14 AM

## 2017-12-24 NOTE — Anesthesia Preprocedure Evaluation (Addendum)
Anesthesia Evaluation  Patient identified by MRN, date of birth, ID band Patient awake    Reviewed: Allergy & Precautions, NPO status , Patient's Chart, lab work & pertinent test results  Airway Mallampati: II  TM Distance: >3 FB Neck ROM: Full    Dental  (+) Edentulous Upper, Edentulous Lower   Pulmonary sleep apnea , COPD,  COPD inhaler, former smoker,     + decreased breath sounds      Cardiovascular hypertension, Pt. on home beta blockers + CAD, + Past MI, + Peripheral Vascular Disease and +CHF  + dysrhythmias Atrial Fibrillation and Ventricular Tachycardia + Cardiac Defibrillator  Rhythm:Regular Rate:Normal     Neuro/Psych  Headaches, Seizures -,  Depression Vocal Cord paralysis  Neuromuscular disease CVA, Residual Symptoms    GI/Hepatic GERD  ,  Endo/Other    Renal/GU CRFRenal disease     Musculoskeletal  (+) Arthritis ,   Abdominal Normal abdominal exam  (+)   Peds  Hematology   Anesthesia Other Findings - HLD  Reproductive/Obstetrics                            Lab Results  Component Value Date   WBC 16.2 (H) 12/24/2017   HGB 15.5 12/24/2017   HCT 44.9 12/24/2017   MCV 91.6 12/24/2017   PLT 212 12/24/2017   Lab Results  Component Value Date   CREATININE 1.38 (H) 12/24/2017   BUN 11 12/24/2017   NA 135 12/24/2017   K 4.3 12/24/2017   CL 98 12/24/2017   CO2 29 12/24/2017   Lab Results  Component Value Date   INR 1.16 12/21/2017   INR 1.10 01/03/2017   INR 1.19 11/14/2016   Echo: Left ventricle: The cavity size was moderately dilated. Systolic   function was severely reduced. The estimated ejection fraction   was in the range of 20% to 25%. Diffuse hypokinesis. Doppler   parameters are consistent with abnormal left ventricular   relaxation (grade 1 diastolic dysfunction). Doppler parameters   are consistent with elevated ventricular end-diastolic filling    pressure. - Ventricular septum: Septal motion showed paradox. - Mitral valve: There was moderate regurgitation directed   posteriorly. - Left atrium: The atrium was mildly dilated. - Right ventricle: Systolic function was normal. - Tricuspid valve: There was trivial regurgitation. - Pulmonary arteries: Systolic pressure was within the normal   range. - Pericardium, extracardiac: There was no pericardial effusion.   Anesthesia Physical Anesthesia Plan  ASA: IV  Anesthesia Plan: General   Post-op Pain Management:    Induction: Intravenous and Rapid sequence  PONV Risk Score and Plan: 3 and Ondansetron, Midazolam and Treatment may vary due to age or medical condition  Airway Management Planned: Oral ETT  Additional Equipment: None  Intra-op Plan:   Post-operative Plan: Extubation in OR  Informed Consent: I have reviewed the patients History and Physical, chart, labs and discussed the procedure including the risks, benefits and alternatives for the proposed anesthesia with the patient or authorized representative who has indicated his/her understanding and acceptance.   Dental advisory given  Plan Discussed with: CRNA  Anesthesia Plan Comments: (Vocal cord re-injury discused. Extensive discussion regarding cardiac history and risks with anesthesia. )       Anesthesia Quick Evaluation

## 2017-12-24 NOTE — Progress Notes (Signed)
Surgery:  CT scan shows findings consistent with appendicitis and fecalith.  There is an increased amount of ascites of uncertain etiology but no well drainable abscess      because of continued pain and leukocytosis the patient will be taken to the operating room today for  Diagnostic laparoscopy, laparoscopic appendectomy, and indicated procedures Most likely he has appendicitis but there is a possibility of alternative diagnosis I have discussed the indications, details, techniques, and numerous risk of the surgery with him.  He is aware of the risk of bleeding, infection, Conversion to open laparotomy, alternative diagnosis causing his illness, injury to adjacent organs requiring major reconstructive surgery, wound healing problems such as hernia or dehiscence, cardiac pulmonary and thromboembolic problems.  He understands these issues well.  All of his questions were answered.  He agrees with this plan.   Edsel Petrin. Dalbert Batman, M.D., [facility] General and Minimally invasive Surgery Breast and Colorectal Surgery Office:   [phone] Pager:   [phone]

## 2017-12-24 NOTE — Anesthesia Postprocedure Evaluation (Signed)
Anesthesia Post Note  Patient: [name]  Procedure(s) Performed: APPENDECTOMY LAPAROSCOPIC (N/A Abdomen)     Patient location during evaluation: PACU Anesthesia Type: General Level of consciousness: awake and alert Pain management: pain level controlled Vital Signs Assessment: post-procedure vital signs reviewed and stable Respiratory status: spontaneous breathing, nonlabored ventilation, respiratory function stable and patient connected to nasal cannula oxygen Cardiovascular status: blood pressure returned to baseline and stable Postop Assessment: no apparent nausea or vomiting Anesthetic complications: no    Last Vitals:  Vitals:   12/24/17 1832 12/24/17 2017  BP: 122/78 (!) 118/54  Pulse: 74 80  Resp: 15 18  Temp: 36.9 C 37.6 C  SpO2: 99% 100%    Last Pain:  Vitals:   12/24/17 2017  TempSrc: Oral  PainSc:                  Effie Berkshire

## 2017-12-24 NOTE — Plan of Care (Signed)

## 2017-12-25 ENCOUNTER — Encounter (HOSPITAL_COMMUNITY): Payer: Self-pay | Admitting: General Surgery

## 2017-12-25 DIAGNOSIS — I2510 Atherosclerotic heart disease of native coronary artery without angina pectoris: Secondary | ICD-10-CM

## 2017-12-25 LAB — BASIC METABOLIC PANEL
ANION GAP: 11 (ref 5–15)
BUN: 21 mg/dL (ref 8–23)
CHLORIDE: 101 mmol/L (ref 98–111)
CO2: 27 mmol/L (ref 22–32)
Calcium: 8.7 mg/dL — ABNORMAL LOW (ref 8.9–10.3)
Creatinine, Ser: 1.8 mg/dL — ABNORMAL HIGH (ref 0.61–1.24)
GFR, EST AFRICAN AMERICAN: 42 mL/min — AB (ref >60)
GFR, EST NON AFRICAN AMERICAN: 36 mL/min — AB (ref >60)
Glucose, Bld: 155 mg/dL — ABNORMAL HIGH (ref 70–99)
POTASSIUM: 4.1 mmol/L (ref 3.5–5.1)
SODIUM: 139 mmol/L (ref 135–145)

## 2017-12-25 LAB — CBC
HCT: 41.2 % (ref 39.0–52.0)
Hemoglobin: 14.2 g/dL (ref 13.0–17.0)
MCH: 31.1 pg (ref 26.0–34.0)
MCHC: 34.5 g/dL (ref 30.0–36.0)
MCV: 90.2 fL (ref 78.0–100.0)
PLATELETS: 305 10*3/uL (ref 150–400)
RBC: 4.57 MIL/uL (ref 4.22–5.81)
RDW: 13.6 % (ref 11.5–15.5)
WBC: 13.4 10*3/uL — ABNORMAL HIGH (ref 4.0–10.5)

## 2017-12-25 MED ORDER — ISOSORB DINITRATE-HYDRALAZINE 20-37.5 MG PO TABS
1.0000 | ORAL_TABLET | Freq: Three times a day (TID) | ORAL | Status: DC
  Administered 2017-12-25 – 2017-12-26 (×4): 1 via ORAL
  Filled 2017-12-25 (×5): qty 1

## 2017-12-25 MED ORDER — APIXABAN 5 MG PO TABS
5.0000 mg | ORAL_TABLET | Freq: Two times a day (BID) | ORAL | Status: DC
Start: 2017-12-25 — End: 2017-12-29
  Administered 2017-12-25 – 2017-12-29 (×8): 5 mg via ORAL
  Filled 2017-12-25 (×9): qty 1

## 2017-12-25 MED ORDER — LACOSAMIDE 50 MG PO TABS
75.0000 mg | ORAL_TABLET | Freq: Two times a day (BID) | ORAL | Status: DC
  Administered 2017-12-25 – 2017-12-29 (×8): 75 mg via ORAL
  Filled 2017-12-25 (×9): qty 2

## 2017-12-25 NOTE — Addendum Note (Signed)
Addendum  created 12/25/17 7741 by Lollie Sails, CRNA   Charge Capture section accepted

## 2017-12-25 NOTE — Progress Notes (Addendum)
Progress Note  Patient Name: [name] Date of Encounter: 12/25/2017  Primary Cardiologist: Glori Bickers, MD   Subjective   [name] is resting in bed.  He still has some abdominal pain but better than surgery.  He has a JP drain to his abdomen.  He denies any chest pain or shortness of breath.  He did get up and walk this morning with his wife and had some shortness of breath with walking.  Inpatient Medications    Scheduled Meds: . budesonide-formoterol  2 puff Inhalation BID  . chlorhexidine  15 mL Mouth Rinse BID  . isosorbide-hydrALAZINE  2 tablet Oral BID  . lacosamide  25 mg Oral BID  . mouth rinse  15 mL Mouth Rinse q12n4p  . metoprolol succinate  25 mg Oral Daily  . metoprolol succinate  50 mg Oral QPM  . mexiletine  150 mg Oral BID  . polyethylene glycol  17 g Oral Daily  . ranolazine  500 mg Oral Daily  . sotalol  80 mg Oral Daily  . tamsulosin  0.4 mg Oral QHS   Continuous Infusions: . sodium chloride Stopped (12/25/17 0354)  . piperacillin-tazobactam (ZOSYN)  IV 12.5 mL/hr at 12/25/17 0600   PRN Meds: sodium chloride, acetaminophen, albuterol, gabapentin, morphine injection, ondansetron **OR** ondansetron (ZOFRAN) IV   Vital Signs    Vitals:   12/24/17 1815 12/24/17 1832 12/24/17 2017 12/25/17 0548  BP: 124/71 122/78 (!) 118/54 133/79  Pulse: 76 74 80 74  Resp: 13 15 18 18   Temp: 97.8 F (36.6 C) 98.5 F (36.9 C) 99.7 F (37.6 C) 98.9 F (37.2 C)  TempSrc:  Oral Oral Oral  SpO2: 99% 99% 100% 98%  Weight:      Height:        Intake/Output Summary (Last 24 hours) at 12/25/2017 0728 Last data filed at 12/25/2017 0600 Gross per 24 hour  Intake 286.2 ml  Output 285 ml  Net 1.2 ml   Filed Weights   12/21/17 2110 12/24/17 1421  Weight: 184 lb 4.9 oz (83.6 kg) 184 lb (83.5 kg)    Telemetry    Sinus rhythm with occ PVCs in the 80's - Personally Reviewed  ECG    No new tracings - Personally Reviewed  Physical Exam   GEN: No  acute distress.   Neck: No JVD Cardiac: RRR with occasional early beat, no murmurs, rubs, or gallops.  Respiratory: Clear to auscultation bilaterally. GI: Soft, tender, JP drain in place, positive bowel sounds MS: No edema; No deformity. Neuro:  Nonfocal  Psych: Normal affect   Labs    Chemistry Recent Labs  Lab 12/21/17 1633  12/23/17 1838 12/24/17 0709 12/25/17 0448  NA 139   < > 133* 135 139  K 3.8   < > 3.9 4.3 4.1  CL 103   < > 97* 98 101  CO2 28   < > 26 29 27   GLUCOSE 131*   < > 140* 126* 155*  BUN 16   < > 11 11 21   CREATININE 1.49*   < > 1.60* 1.38* 1.80*  CALCIUM 9.0   < > 8.4* 8.8* 8.7*  PROT 7.2  --   --   --   --   ALBUMIN 4.1  --   --   --   --   AST 24  --   --   --   --   ALT 34  --   --   --   --  ALKPHOS 42  --   --   --   --   BILITOT 0.7  --   --   --   --   GFRNONAA 45*   < > 42* 50* 36*  GFRAA 53*   < > 48* 58* 42*  ANIONGAP 8   < > 10 8 11    < > = values in this interval not displayed.     Hematology Recent Labs  Lab 12/23/17 0442 12/24/17 0709 12/25/17 0448  WBC 16.2* 16.2* 13.4*  RBC 4.68 4.90 4.57  HGB 14.6 15.5 14.2  HCT 42.3 44.9 41.2  MCV 90.4 91.6 90.2  MCH 31.2 31.6 31.1  MCHC 34.5 34.5 34.5  RDW 13.6 13.5 13.6  PLT 210 212 305    Cardiac EnzymesNo results for input(s): TROPONINI in the last 168 hours. No results for input(s): TROPIPOC in the last 168 hours.   BNPNo results for input(s): BNP, PROBNP in the last 168 hours.   DDimer No results for input(s): DDIMER in the last 168 hours.   Radiology    Ct Abdomen Pelvis Wo Contrast  Result Date: 12/24/2017 CLINICAL DATA:  Right lower quadrant abdominal pain. Appendiceal abscess. EXAM: CT ABDOMEN AND PELVIS WITHOUT CONTRAST TECHNIQUE: Multidetector CT imaging of the abdomen and pelvis was performed following the standard protocol without IV contrast. COMPARISON:  12/21/2017 FINDINGS: Lower chest: Increased dependent atelectasis in both lower lobes. Mild lingular atelectasis  or scarring. Dual lead pacer. Small anterior pericardial effusion, increased from previous. Hepatobiliary: Stable hypodense hepatic lesions. Trace perihepatic ascites, increased from previous. Dependent density in the gallbladder, likely sludge. Pancreas: Unremarkable Spleen: Trace perisplenic ascites. Adrenals/Urinary Tract: Adrenal glands normal. Scarring in the right kidney upper pole, versus less likely angiomyolipoma. Right mid kidney fluid density lesion compatible with cyst. Potential 1 mm right kidney upper pole nonobstructive renal calculus on image 68/4. Stomach/Bowel: There is appendiceal wall thickening up to 1.2 cm, with appendicolith along the appendiceal tip which is indistinct and more distended than the rest of the appendix. Localized perforation is a distinct possibility, but no extraluminal gas is identified. There is an increased amount of fluid density tracking along tissue planes in the vicinity of the appendix along with mesenteric edema. There is wall thickening in distal small bowel loops in the vicinity of the appendix. Scattered air-fluid levels in nondilated small bowel loops. Vascular/Lymphatic: Aortoiliac atherosclerotic vascular disease. Small likely reactive pelvic and retroperitoneal lymph nodes. Reproductive: Unremarkable Other: Increased amount of free pelvic fluid. A small amount of pelvic fluid extends into the direct left inguinal hernia. This hernia also contains adipose tissue. Musculoskeletal: Congenitally short pedicles in the lumbar spine with mild spondylosis and degenerative disc disease resulting in impingement at L3-4 and borderline impingement at L4-5. Small umbilical hernia contains adipose tissue. In addition to the direct left inguinal hernia there is a small indirect right inguinal hernia containing adipose tissues. IMPRESSION: 1. Acute appendicitis with the mid appendix about 1.5 cm in diameter, and the distal appendix containing appendicolith slightly more  dilated but with indistinct margins. No extraluminal gas. There is an increased amount of ascites, primarily pelvic but with a small amount of perisplenic and perihepatic ascites. No well-defined drainable abscess on today's noncontrast exam. 2. Mildly increased dependent atelectasis in both lower lobes. Small anterior pericardial effusion, increased from prior. 3. Stable hypodense hepatic lesions, likely cysts. 4. Potential 1 mm right kidney upper pole nonobstructive renal calculus. Scarring in the right kidney upper pole as well. 5.  Aortic  Atherosclerosis (ICD10-I70.0). 6. Mild impingement at L3-4. 7. Small direct left in indirect right inguinal hernias containing adipose tissue and a small amount of fluid. Electronically Signed   By: Van Clines M.D.   On: 12/24/2017 09:14    Cardiac Studies   Echocardiogram 10/25/2017 Study Conclusions - Left ventricle: The cavity size was moderately dilated. Systolic function was severely reduced. The estimated ejection fraction was in the range of 20% to 25%. Diffuse hypokinesis. Doppler parameters are consistent with abnormal left ventricular relaxation (grade 1 diastolic dysfunction). Doppler parameters are consistent with elevated ventricular end-diastolic filling pressure. - Ventricular septum: Septal motion showed paradox. - Mitral valve: There was moderate regurgitation directed posteriorly. - Left atrium: The atrium was mildly dilated. - Right ventricle: Systolic function was normal. - Tricuspid valve: There was trivial regurgitation. - Pulmonary arteries: Systolic pressure was within the normal range. - Pericardium, extracardiac: There was no pericardial effusion.  Impressions: - Since the last study on 11/14/2016 LVEF has decreased from 30% to 20-25% with diffuse hypokinesis and paradoxical septal motion. RVEF is normal.  Patient Profile     72 y.o. 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  Assessment & Plan    1. Acute appendicitis -Pt with hx of CAD, EF 20-25% with ICD, remote stroke and thus high risk for surgery, however, he did not respond adequately to conservative therapy and required laparoscopic appendectomy yesterday.  JP drain in place.  The patient has done well through the surgery and through the night. -Continues on antibiotics for 10 days. WBCs down from 16.2 to 13.4.   2. Paroxysmal atrial fibrillation -Maintaining sinus rhythm on continued sotalol. -Anticoagulation on hold for surgery. Resume when OK with surgeon.   3. Chronic combined systolic and diastolic CHF -He continues on his home meds of Toprol, Bidil. Entresto on hold surrounding surgery. SCr up to 1.80 this am. Continue to watch and hold Entresto until stable.  -Pt had lost 6 pounds in setting of nausea/vomiting with appendicitis. No wt today, will order daily wts and strict I&O. -Patient had only 550 mL of urine output overnight.   -Currently he has no shortness of breath, JVD or edema.  He did have mild shortness of breath with walking.   -Watch intake and output and symptoms. Lasix as needed -BP stable, 133/79 this am  4. Ventricular tachycardia -Has hx of Vtach and ICD in place. VT ablation last summer but continued to have VT, now on Mexiletine.   5. CAD -S/P POBA circ 1986, cath 10/2016 with stable non-obstructive CAD -No anginal symptoms -Continue BB. Resume statin when OK with surgery/primary team.   For questions or updates, please contact [place] Please consult [email or web address] for contact info under Cardiology/STEMI.      Signed, Daune Perch, NP  12/25/2017, 7:28 AM

## 2017-12-25 NOTE — Progress Notes (Signed)
PT Cancellation Note  Patient Details Name: [name] MRN: [id number] DOB: 11-Jan-1946   Cancelled Treatment:    Reason Eval/Treat Not Completed: Patient declined, no reason specified Pt adamantly refuses to mobilize.  Pt states reason, "I just don't want to.  I'll walk when I feel like it."  RN also into room and unable to encourage pt to participate.   Murielle Stang,KATHrine E 12/25/2017, 11:15 AM Carmelia Bake, PT, DPT 12/25/2017 Pager: [phone]

## 2017-12-25 NOTE — Progress Notes (Signed)
[facility] Progress Note  Patient: [name] NTZ:[id number]   PCP: Dorothyann Peng, NP DOB: 08/17/1945   DOA: 12/21/2017   DOS: 12/25/2017   Date of Service: the patient was seen and examined on 12/25/2017  Subjective: Pain is controlled, no nausea no vomiting.  Passing gas.  Walking to the bathroom.  Brief hospital course: Pt. with PMH of A. fib on Eliquis, chronic systolic CHF, EF 67%, ICD implant, COPD, recurrent VT S/P ablation, seizure disorder; admitted on 12/21/2017, presented with complaint of abdominal pain, was found to have acute appendicitis with mild perforation.  Underwent appendectomy 12/24/2017. Currently further plan is for postoperative recovery  Assessment and Plan: 1.  Acute appendicitis with perforation.  General surgery consulted who recommended medicine admission and conservative management. Cardiology consulted for preop clearance. Patient is on IV antibiotics, clear liquid diet, passing gas. Eliquis was on hold for possible procedure. Repeat CT scan performed 12/24/2017, patient taken to the OR for laparoscopic appendectomy as high risk surgery.  Continue pain management morphine every 2 hours. Per surgery patient will require 10 days of oral antibiotics  2.  Chronic systolic CHF. Recurrent VT. NSVT. S/P AICD implant.   chronic A. fib on chronic anticoagulation Cardiology consult for preop clearance. Follows up with heart failure and EP clinic here.   Patient's family is in process of getting a second opinion at [facility]. Continue Toprol-XL per family patient takes 25 and 50 mg. Continue BiDil as well. Holding [place]. Also holding Lasix. Aldactone is already on hold due to gynecomastia. Avoiding aggressive IV hydration since the patient's blood pressure is stable Resuming anti-coagulation today based on recommendation from General surgery  Continue sotalol, patient is on  mexiletine as well. Continue Ranexa. Rechecking BMP and mag. Per EP note patient  may require repeat ablation for recurrent VT.  3.  COPD. OSA. No evidence of ILD per pulmonary. Continue Symbicort and rescue inhalers.  4.  Chronic kidney disease stage III. Renal function stable at baseline.  Monitor daily BMP. Renal function mildly worsened as compared to admission although still better than his baseline. Monitor for now  Diet: diet per surgery DVT Prophylaxis: Apixaban  Advance goals of care discussion: full code  Family Communication: family was present at bedside, at the time of interview. The pt provided permission to discuss medical plan with the family. Opportunity was given to ask question and all questions were answered satisfactorily.   Disposition:  Discharge to home.  Consultants: General surgery, cardiology  Procedures: none  Antibiotics: Anti-infectives (From admission, onward)   Start     Dose/Rate Route Frequency Ordered Stop   12/22/17 0400  piperacillin-tazobactam (ZOSYN) IVPB 3.375 g     3.375 g 12.5 mL/hr over 240 Minutes Intravenous Every 8 hours 12/21/17 2035     12/21/17 1845  piperacillin-tazobactam (ZOSYN) IVPB 3.375 g     3.375 g 100 mL/hr over 30 Minutes Intravenous  Once 12/21/17 1841 12/21/17 1950       Objective: Physical Exam: Vitals:   12/25/17 0548 12/25/17 0840 12/25/17 1415 12/25/17 1700  BP: 133/79 131/72 (!) 153/81   Pulse: 74 83 78   Resp: 18  15   Temp: 98.9 F (37.2 C)  98.8 F (37.1 C)   TempSrc: Oral  Oral   SpO2: 98%  92%   Weight:    89.9 kg (198 lb 3.2 oz)  Height:        Intake/Output Summary (Last 24 hours) at 12/25/2017 1805 Last data filed at 12/25/2017 1700  Gross per 24 hour  Intake 890.14 ml  Output 1110 ml  Net -219.86 ml   Filed Weights   12/21/17 2110 12/24/17 1421 12/25/17 1700  Weight: 83.6 kg (184 lb 4.9 oz) 83.5 kg (184 lb) 89.9 kg (198 lb 3.2 oz)   General: Alert, Awake and Oriented to Time, Place and Person. Appear in marked distress, affect appropriate Eyes: PERRL, Conjunctiva  normal ENT: Oral Mucosa clear moist. Neck: no JVD, no Abnormal Mass Or lumps Cardiovascular: S1 and S2 Present, no Murmur, Peripheral Pulses Present Respiratory: normal respiratory effort, Bilateral Air entry equal and Decreased, no use of accessory muscle, Clear to Auscultation, no Crackles, no wheezes Abdomen: Bowel Sound present, Soft and moderate tenderness, no hernia Skin: no redness, no Rash, no induration Extremities: no Pedal edema, no calf tenderness Neurologic: Grossly no focal neuro deficit. Bilaterally Equal motor strength  Data Reviewed: CBC: Recent Labs  Lab 12/21/17 1633 12/22/17 0043 12/23/17 0442 12/24/17 0709 12/25/17 0448  WBC 11.6* 10.5 16.2* 16.2* 13.4*  NEUTROABS  --   --  12.4*  --   --   HGB 15.0 15.4 14.6 15.5 14.2  HCT 43.3 45.1 42.3 44.9 41.2  MCV 91.4 91.3 90.4 91.6 90.2  PLT 264 241 210 212 539   Basic Metabolic Panel: Recent Labs  Lab 12/22/17 0043 12/23/17 0442 12/23/17 1838 12/24/17 0709 12/25/17 0448  NA 140 135 133* 135 139  K 3.5 3.9 3.9 4.3 4.1  CL 105 98 97* 98 101  CO2 27 27 26 29 27   GLUCOSE 141* 139* 140* 126* 155*  BUN 14 12 11 11 21   CREATININE 1.38* 1.58* 1.60* 1.38* 1.80*  CALCIUM 8.8* 8.5* 8.4* 8.8* 8.7*  MG  --   --  1.7  --   --     Liver Function Tests: Recent Labs  Lab 12/21/17 1633  AST 24  ALT 34  ALKPHOS 42  BILITOT 0.7  PROT 7.2  ALBUMIN 4.1   Recent Labs  Lab 12/21/17 1633  LIPASE 57*   No results for input(s): AMMONIA in the last 168 hours. Coagulation Profile: Recent Labs  Lab 12/21/17 1918  INR 1.16   Cardiac Enzymes: No results for input(s): CKTOTAL, CKMB, CKMBINDEX, TROPONINI in the last 168 hours. BNP (last 3 results) No results for input(s): PROBNP in the last 8760 hours. CBG: No results for input(s): GLUCAP in the last 168 hours. Studies: No results found.  Scheduled Meds: . apixaban  5 mg Oral BID  . budesonide-formoterol  2 puff Inhalation BID  . chlorhexidine  15 mL Mouth  Rinse BID  . isosorbide-hydrALAZINE  1 tablet Oral TID  . lacosamide  75 mg Oral BID  . mouth rinse  15 mL Mouth Rinse q12n4p  . metoprolol succinate  25 mg Oral Daily  . metoprolol succinate  50 mg Oral QPM  . mexiletine  150 mg Oral BID  . polyethylene glycol  17 g Oral Daily  . ranolazine  500 mg Oral Daily  . sotalol  80 mg Oral Daily  . tamsulosin  0.4 mg Oral QHS   Continuous Infusions: . sodium chloride Stopped (12/25/17 0354)  . piperacillin-tazobactam (ZOSYN)  IV 3.375 g (12/25/17 1241)   PRN Meds: sodium chloride, acetaminophen, albuterol, gabapentin, morphine injection, ondansetron **OR** ondansetron (ZOFRAN) IV  Time spent: 35 minutes  Author: Berle Mull, MD [facility] Pager: [phone] 12/25/2017 6:05 PM  If 7PM-7AM, please contact night-coverage at [email or web address], password [facility]

## 2017-12-25 NOTE — Progress Notes (Signed)
1 Day Post-Op  Subjective: Awake and alert.  Denies nausea.  Says he passed some flatus. Still has abdominal pain but says it is a good deal better.  Afebrile.  Heart rate 74.  BP 133/79.  SPO2 98% Urine output reasonable Hemoglobin 14.4.  WBC 13.4.  Potassium 4.1.  Glucose 155.  Creatinine 1.8, slightly elevated over baseline  Objective: Vital signs in last 24 hours: Temp:  [97.7 F (36.5 C)-99.7 F (37.6 C)] 98.9 F (37.2 C) (08/06 0548) Pulse Rate:  [63-84] 74 (08/06 0548) Resp:  [12-19] 18 (08/06 0548) BP: (97-147)/(51-79) 133/79 (08/06 0548) SpO2:  [95 %-100 %] 98 % (08/06 0548) Weight:  [83.5 kg (184 lb)] 83.5 kg (184 lb) (08/05 1421) Last BM Date: 12/20/17  Intake/Output from previous day: 08/05 0701 - 08/06 0700 In: 241.1 [I.V.:111.1; IV Piggyback:99.9] Out: 555 [Urine:450; Drains:80; Blood:25] Intake/Output this shift: Total I/O In: 191.1 [I.V.:111.1; Other:30; IV Piggyback:49.9] Out: -   General appearance: Alert.  Appropriate.  Oriented.  Deconditioned. Resp: Clear anteriorly without wheeze or rhonchi GI: Soft.  Wounds look fine.  Drainage serosanguineous.  Mild appropriate tenderness.  Lab Results:  Results for orders placed or performed during the hospital encounter of 12/21/17 (from the past 24 hour(s))  CBC     Status: Abnormal   Collection Time: 12/24/17  7:09 AM  Result Value Ref Range   WBC 16.2 (H) 4.0 - 10.5 K/uL   RBC 4.90 4.22 - 5.81 MIL/uL   Hemoglobin 15.5 13.0 - 17.0 g/dL   HCT 44.9 39.0 - 52.0 %   MCV 91.6 78.0 - 100.0 fL   MCH 31.6 26.0 - 34.0 pg   MCHC 34.5 30.0 - 36.0 g/dL   RDW 13.5 11.5 - 15.5 %   Platelets 212 150 - 400 K/uL  Basic metabolic panel     Status: Abnormal   Collection Time: 12/24/17  7:09 AM  Result Value Ref Range   Sodium 135 135 - 145 mmol/L   Potassium 4.3 3.5 - 5.1 mmol/L   Chloride 98 98 - 111 mmol/L   CO2 29 22 - 32 mmol/L   Glucose, Bld 126 (H) 70 - 99 mg/dL   BUN 11 8 - 23 mg/dL   Creatinine, Ser 1.38 (H)  0.61 - 1.24 mg/dL   Calcium 8.8 (L) 8.9 - 10.3 mg/dL   GFR calc non Af Amer 50 (L) >60 mL/min   GFR calc Af Amer 58 (L) >60 mL/min   Anion gap 8 5 - 15  MRSA PCR Screening     Status: None   Collection Time: 12/24/17 11:12 AM  Result Value Ref Range   MRSA by PCR NEGATIVE NEGATIVE  CBC     Status: Abnormal   Collection Time: 12/25/17  4:48 AM  Result Value Ref Range   WBC 13.4 (H) 4.0 - 10.5 K/uL   RBC 4.57 4.22 - 5.81 MIL/uL   Hemoglobin 14.2 13.0 - 17.0 g/dL   HCT 41.2 39.0 - 52.0 %   MCV 90.2 78.0 - 100.0 fL   MCH 31.1 26.0 - 34.0 pg   MCHC 34.5 30.0 - 36.0 g/dL   RDW 13.6 11.5 - 15.5 %   Platelets 305 150 - 400 K/uL  Basic metabolic panel     Status: Abnormal   Collection Time: 12/25/17  4:48 AM  Result Value Ref Range   Sodium 139 135 - 145 mmol/L   Potassium 4.1 3.5 - 5.1 mmol/L   Chloride 101 98 - 111 mmol/L     CO2 27 22 - 32 mmol/L   Glucose, Bld 155 (H) 70 - 99 mg/dL   BUN 21 8 - 23 mg/dL   Creatinine, Ser 1.80 (H) 0.61 - 1.24 mg/dL   Calcium 8.7 (L) 8.9 - 10.3 mg/dL   GFR calc non Af Amer 36 (L) >60 mL/min   GFR calc Af Amer 42 (L) >60 mL/min   Anion gap 11 5 - 15     Studies/Results: Ct Abdomen Pelvis Wo Contrast  Result Date: 12/24/2017 CLINICAL DATA:  Right lower quadrant abdominal pain. Appendiceal abscess. EXAM: CT ABDOMEN AND PELVIS WITHOUT CONTRAST TECHNIQUE: Multidetector CT imaging of the abdomen and pelvis was performed following the standard protocol without IV contrast. COMPARISON:  12/21/2017 FINDINGS: Lower chest: Increased dependent atelectasis in both lower lobes. Mild lingular atelectasis or scarring. Dual lead pacer. Small anterior pericardial effusion, increased from previous. Hepatobiliary: Stable hypodense hepatic lesions. Trace perihepatic ascites, increased from previous. Dependent density in the gallbladder, likely sludge. Pancreas: Unremarkable Spleen: Trace perisplenic ascites. Adrenals/Urinary Tract: Adrenal glands normal. Scarring in the  right kidney upper pole, versus less likely angiomyolipoma. Right mid kidney fluid density lesion compatible with cyst. Potential 1 mm right kidney upper pole nonobstructive renal calculus on image 68/4. Stomach/Bowel: There is appendiceal wall thickening up to 1.2 cm, with appendicolith along the appendiceal tip which is indistinct and more distended than the rest of the appendix. Localized perforation is a distinct possibility, but no extraluminal gas is identified. There is an increased amount of fluid density tracking along tissue planes in the vicinity of the appendix along with mesenteric edema. There is wall thickening in distal small bowel loops in the vicinity of the appendix. Scattered air-fluid levels in nondilated small bowel loops. Vascular/Lymphatic: Aortoiliac atherosclerotic vascular disease. Small likely reactive pelvic and retroperitoneal lymph nodes. Reproductive: Unremarkable Other: Increased amount of free pelvic fluid. A small amount of pelvic fluid extends into the direct left inguinal hernia. This hernia also contains adipose tissue. Musculoskeletal: Congenitally short pedicles in the lumbar spine with mild spondylosis and degenerative disc disease resulting in impingement at L3-4 and borderline impingement at L4-5. Small umbilical hernia contains adipose tissue. In addition to the direct left inguinal hernia there is a small indirect right inguinal hernia containing adipose tissues. IMPRESSION: 1. Acute appendicitis with the mid appendix about 1.5 cm in diameter, and the distal appendix containing appendicolith slightly more dilated but with indistinct margins. No extraluminal gas. There is an increased amount of ascites, primarily pelvic but with a small amount of perisplenic and perihepatic ascites. No well-defined drainable abscess on today's noncontrast exam. 2. Mildly increased dependent atelectasis in both lower lobes. Small anterior pericardial effusion, increased from prior. 3.  Stable hypodense hepatic lesions, likely cysts. 4. Potential 1 mm right kidney upper pole nonobstructive renal calculus. Scarring in the right kidney upper pole as well. 5.  Aortic Atherosclerosis (ICD10-I70.0). 6. Mild impingement at L3-4. 7. Small direct left in indirect right inguinal hernias containing adipose tissue and a small amount of fluid. Electronically Signed   By: Van Clines M.D.   On: 12/24/2017 09:14    . budesonide-formoterol  2 puff Inhalation BID  . chlorhexidine  15 mL Mouth Rinse BID  . isosorbide-hydrALAZINE  2 tablet Oral BID  . lacosamide  25 mg Oral BID  . mouth rinse  15 mL Mouth Rinse q12n4p  . metoprolol succinate  25 mg Oral Daily  . metoprolol succinate  50 mg Oral QPM  . mexiletine  150 mg Oral  BID  . polyethylene glycol  17 g Oral Daily  . ranolazine  500 mg Oral Daily  . sotalol  80 mg Oral Daily  . tamsulosin  0.4 mg Oral QHS     Assessment/Plan: s/p Procedure(s): APPENDECTOMY LAPAROSCOPIC  Ruptured appendicitis with localized abscess POD #1 laparoscopic appendectomy Will get Foley out and mobilize. PT consult Clear liquid diet Needs antibiotics for 10 days Continue IV fluid support Check be met in creatinine tomorrow  Atrial fibrillation on chronic Eliquis-okay to restart Eliquis if desired by medicine and cardiology CHF with EF 25% History CVA history CKD History of myocardial infarction Hypertension AICD in place Prior periumbilical hernia repair History seizures  _0 @  LOS: 4 days    Renelda Loma M Hawley Michel 12/25/2017  . .prob

## 2017-12-25 NOTE — Progress Notes (Signed)
PT Cancellation Note  Patient Details Name: [name] MRN: [id number] DOB: Nov 06, 1945   Cancelled Treatment:    Reason Eval/Treat Not Completed: Pain limiting ability to participate Per daughter, pt just ambulated short distance in hallway and received pain meds due to increased pain.  Daughter requests PT check back.  Will check back as schedule permits.   Taseen Marasigan,KATHrine E 12/25/2017, 10:14 AM Carmelia Bake, PT, DPT 12/25/2017 Pager: [phone]

## 2017-12-25 NOTE — Progress Notes (Signed)
Attempted to ambulate patient and assist w/ oob to chair for lunch, adamantly refuses and educated.

## 2017-12-26 DIAGNOSIS — N183 Chronic kidney disease, stage 3 (moderate): Secondary | ICD-10-CM

## 2017-12-26 LAB — COMPREHENSIVE METABOLIC PANEL
ALT: 16 U/L (ref 0–44)
AST: 18 U/L (ref 15–41)
Albumin: 3 g/dL — ABNORMAL LOW (ref 3.5–5.0)
Alkaline Phosphatase: 30 U/L — ABNORMAL LOW (ref 38–126)
Anion gap: 12 (ref 5–15)
BILIRUBIN TOTAL: 0.8 mg/dL (ref 0.3–1.2)
BUN: 26 mg/dL — ABNORMAL HIGH (ref 8–23)
CALCIUM: 9.2 mg/dL (ref 8.9–10.3)
CO2: 29 mmol/L (ref 22–32)
CREATININE: 1.63 mg/dL — AB (ref 0.61–1.24)
Chloride: 98 mmol/L (ref 98–111)
GFR, EST AFRICAN AMERICAN: 47 mL/min — AB (ref >60)
GFR, EST NON AFRICAN AMERICAN: 41 mL/min — AB (ref >60)
Glucose, Bld: 197 mg/dL — ABNORMAL HIGH (ref 70–99)
Potassium: 4 mmol/L (ref 3.5–5.1)
Sodium: 139 mmol/L (ref 135–145)
TOTAL PROTEIN: 6.9 g/dL (ref 6.5–8.1)

## 2017-12-26 LAB — CBC WITH DIFFERENTIAL/PLATELET
BASOS ABS: 0 10*3/uL (ref 0.0–0.1)
BASOS PCT: 0 %
EOS ABS: 0 10*3/uL (ref 0.0–0.7)
EOS PCT: 0 %
HCT: 42.2 % (ref 39.0–52.0)
Hemoglobin: 14.9 g/dL (ref 13.0–17.0)
LYMPHS ABS: 1.3 10*3/uL (ref 0.7–4.0)
Lymphocytes Relative: 10 %
MCH: 31.2 pg (ref 26.0–34.0)
MCHC: 35.3 g/dL (ref 30.0–36.0)
MCV: 88.5 fL (ref 78.0–100.0)
Monocytes Absolute: 1.4 10*3/uL — ABNORMAL HIGH (ref 0.1–1.0)
Monocytes Relative: 11 %
Neutro Abs: 10.1 10*3/uL — ABNORMAL HIGH (ref 1.7–7.7)
Neutrophils Relative %: 79 %
PLATELETS: 351 10*3/uL (ref 150–400)
RBC: 4.77 MIL/uL (ref 4.22–5.81)
RDW: 13.5 % (ref 11.5–15.5)
WBC: 12.8 10*3/uL — AB (ref 4.0–10.5)

## 2017-12-26 LAB — MAGNESIUM: MAGNESIUM: 2.4 mg/dL (ref 1.7–2.4)

## 2017-12-26 MED ORDER — ACETAMINOPHEN 80 MG PO CHEW
80.0000 mg | CHEWABLE_TABLET | Freq: Four times a day (QID) | ORAL | Status: DC | PRN
  Filled 2017-12-26: qty 1

## 2017-12-26 MED ORDER — ISOSORB DINITRATE-HYDRALAZINE 20-37.5 MG PO TABS
2.0000 | ORAL_TABLET | Freq: Three times a day (TID) | ORAL | Status: DC
  Administered 2017-12-26 – 2017-12-29 (×9): 2 via ORAL
  Filled 2017-12-26 (×10): qty 2

## 2017-12-26 MED ORDER — ONDANSETRON HCL 4 MG/2ML IJ SOLN
4.0000 mg | Freq: Once | INTRAMUSCULAR | Status: AC
  Administered 2017-12-26: 4 mg via INTRAVENOUS
  Filled 2017-12-26: qty 2

## 2017-12-26 MED ORDER — SODIUM CHLORIDE 0.9 % IV SOLN: INTRAVENOUS | Status: DC | PRN

## 2017-12-26 MED ORDER — FAMOTIDINE 20 MG PO TABS
20.0000 mg | ORAL_TABLET | Freq: Every day | ORAL | Status: DC
  Administered 2017-12-26 – 2017-12-29 (×4): 20 mg via ORAL
  Filled 2017-12-26 (×4): qty 1

## 2017-12-26 MED ORDER — SACUBITRIL-VALSARTAN 97-103 MG PO TABS
1.0000 | ORAL_TABLET | Freq: Two times a day (BID) | ORAL | Status: DC
  Administered 2017-12-26 – 2017-12-29 (×7): 1 via ORAL
  Filled 2017-12-26 (×7): qty 1

## 2017-12-26 MED ORDER — METOCLOPRAMIDE HCL 5 MG/ML IJ SOLN
10.0000 mg | Freq: Three times a day (TID) | INTRAMUSCULAR | Status: DC | PRN
  Administered 2017-12-26: 10 mg via INTRAVENOUS
  Filled 2017-12-26: qty 2

## 2017-12-26 MED ORDER — FUROSEMIDE 20 MG PO TABS
20.0000 mg | ORAL_TABLET | ORAL | Status: DC
  Administered 2017-12-26 – 2017-12-28 (×2): 20 mg via ORAL
  Filled 2017-12-26 (×2): qty 1

## 2017-12-26 MED ORDER — ALUM & MAG HYDROXIDE-SIMETH 200-200-20 MG/5ML PO SUSP
30.0000 mL | ORAL | Status: DC | PRN
  Administered 2017-12-26: 30 mL via ORAL
  Filled 2017-12-26: qty 30

## 2017-12-26 NOTE — Evaluation (Signed)
Physical Therapy Evaluation Patient Details Name: [name] MRN: [id number] DOB: 08-04-1945 Today's Date: 12/26/2017   History of Present Illness  y.o. male with a past medical history significant for PAF on eliquis, chronic systolic CHF EF 29%. ICD implant, COPD, Recurrent VT s/p ablation 2018, seizure disorder, CKD, and hypertension and admitted for Acute Appendicitis with perforation s/p laparoscopic appendectomy 8/5  Clinical Impression  Pt admitted with above diagnosis. Pt currently with functional limitations due to the deficits listed below (see PT Problem List).  Pt will benefit from skilled PT to increase their independence and safety with mobility to allow discharge to the venue listed below.  Pt with little verbalization today however agreeable to ambulate with spouses encouragement.  Spouse plans for pt to d/c home.  Pt appears to require some family encouragement for mobility (RN reports pt has been refusing to ambulate today).  Spouse attempting to encourage pt as she is able (granddaughter just born today, so she has been busy running between hospitals).       Follow Up Recommendations Home health PT    Equipment Recommendations  Rolling walker with 5" wheels    Recommendations for Other Services       Precautions / Restrictions Precautions Precautions: Fall Precaution Comments: L drain      Mobility  Bed Mobility Overal bed mobility: Needs Assistance Bed Mobility: Supine to Sit     Supine to sit: Min assist     General bed mobility comments: assist for trunk upright  Transfers Overall transfer level: Needs assistance Equipment used: Rolling walker (2 wheeled) Transfers: Sit to/from Stand Sit to Stand: Min assist         General transfer comment: assist to rise and steady, verbal cues for safe technique  Ambulation/Gait Ambulation/Gait assistance: Min guard Gait Distance (Feet): 160 Feet Assistive device: Rolling walker (2 wheeled) Gait  Pattern/deviations: Step-through pattern;Decreased stride length;Trunk flexed     General Gait Details: pt steady with RW, slow pace, encouraged trunk extension as pain allows  Stairs            Wheelchair Mobility    Modified Rankin (Stroke Patients Only)       Balance Overall balance assessment: Needs assistance         Standing balance support: Bilateral upper extremity supported Standing balance-Leahy Scale: Poor                               Pertinent Vitals/Pain Pain Assessment: No/denies pain    Home Living Family/patient expects to be discharged to:: Private residence Living Arrangements: Spouse/significant other Available Help at Discharge: Family Type of Home: House       Home Layout: One level Home Equipment: None      Prior Function Level of Independence: Independent               Hand Dominance        Extremity/Trunk Assessment        Lower Extremity Assessment Lower Extremity Assessment: Generalized weakness       Communication   Communication: No difficulties  Cognition Arousal/Alertness: Awake/alert Behavior During Therapy: Flat affect Overall Cognitive Status: Within Functional Limits for tasks assessed                                 General Comments: has been refusing to mobilize per RN however spouse arrived as  PT arrived and she was able to encourage him      General Comments      Exercises     Assessment/Plan    PT Assessment Patient needs continued PT services  PT Problem List Decreased strength;Decreased range of motion;Decreased mobility;Decreased activity tolerance;Decreased knowledge of use of DME;Decreased balance       PT Treatment Interventions DME instruction;Therapeutic activities;Therapeutic exercise;Patient/family education;Gait training;Functional mobility training;Balance training;Stair training    PT Goals (Current goals can be found in the Care Plan section)   Acute Rehab PT Goals Patient Stated Goal: spouse wishes for pt to d/c home - granddaughter born today 12/26/17!! PT Goal Formulation: With patient/family Time For Goal Achievement: 01/09/18 Potential to Achieve Goals: Good    Frequency Min 3X/week   Barriers to discharge        Co-evaluation               AM-PAC PT "6 Clicks" Daily Activity  Outcome Measure Difficulty turning over in bed (including adjusting bedclothes, sheets and blankets)?: A Lot Difficulty moving from lying on back to sitting on the side of the bed? : Unable Difficulty sitting down on and standing up from a chair with arms (e.g., wheelchair, bedside commode, etc,.)?: Unable Help needed moving to and from a bed to chair (including a wheelchair)?: A Little Help needed walking in hospital room?: A Little Help needed climbing 3-5 steps with a railing? : A Lot 6 Click Score: 12    End of Session   Activity Tolerance: Patient tolerated treatment well Patient left: in chair;with family/visitor present;with nursing/sitter in room;with call bell/phone within reach Nurse Communication: Mobility status PT Visit Diagnosis: Difficulty in walking, not elsewhere classified (R26.2)    Time: 8325-4982 PT Time Calculation (min) (ACUTE ONLY): 16 min   Charges:   PT Evaluation $PT Eval Low Complexity: 1 Low        Carmelia Bake, PT, DPT 12/26/2017 Pager: [phone]  York Ram E 12/26/2017, 3:54 PM

## 2017-12-26 NOTE — Progress Notes (Signed)
2 Days Post-Op  Subjective: One episode low volume emesis this morning.  Nauseated Passing flatus but no stool Says he has no pain or respiratory problems Afebrile.  Heart rate 70.  BP 143/78.  SPO2 96% Potassium 4.0.  BUN 26.  This is up a bit.  Creatinine 1.63.  Glucose 197 WBC 12,800.  Hemoglobin 14.9.   Objective: Vital signs in last 24 hours: Temp:  [98.6 F (37 C)-99.3 F (37.4 C)] 98.6 F (37 C) (08/07 0603) Pulse Rate:  [70-85] 70 (08/07 0603) Resp:  [15-20] 16 (08/07 0603) BP: (131-153)/(72-81) 143/78 (08/07 0603) SpO2:  [92 %-96 %] 96 % (08/07 0603) Weight:  [89.9 kg (198 lb 3.2 oz)] 89.9 kg (198 lb 3.2 oz) (08/06 1700) Last BM Date: 12/20/17  Intake/Output from previous day: 08/06 0701 - 08/07 0700 In: 677.4 [P.O.:530; I.V.:73.5; IV Piggyback:73.9] Out: 1275 [Urine:700; Drains:575] Intake/Output this shift: Total I/O In: 23.5 [I.V.:23.5] Out: 425 [Urine:200; Drains:225]   EXAM: General appearance: Alert.  Appropriate.  Oriented.  Deconditioned. Resp: Clear anteriorly without wheeze or rhonchi GI: Soft.    I can hear bowel sounds.  Borderline distended.  Wounds look fine.  Drainage serosanguineous.  Mild appropriate tenderness. Drainage serosanguineous, clear.  Nonenteric.     Lab Results:  Results for orders placed or performed during the hospital encounter of 12/21/17 (from the past 24 hour(s))  Comprehensive metabolic panel     Status: Abnormal   Collection Time: 12/26/17  4:43 AM  Result Value Ref Range   Sodium 139 135 - 145 mmol/L   Potassium 4.0 3.5 - 5.1 mmol/L   Chloride 98 98 - 111 mmol/L   CO2 29 22 - 32 mmol/L   Glucose, Bld 197 (H) 70 - 99 mg/dL   BUN 26 (H) 8 - 23 mg/dL   Creatinine, Ser 1.63 (H) 0.61 - 1.24 mg/dL   Calcium 9.2 8.9 - 10.3 mg/dL   Total Protein 6.9 6.5 - 8.1 g/dL   Albumin 3.0 (L) 3.5 - 5.0 g/dL   AST 18 15 - 41 U/L   ALT 16 0 - 44 U/L   Alkaline Phosphatase 30 (L) 38 - 126 U/L   Total Bilirubin 0.8 0.3 - 1.2 mg/dL   GFR calc non Af Amer 41 (L) >60 mL/min   GFR calc Af Amer 47 (L) >60 mL/min   Anion gap 12 5 - 15  CBC with Differential/Platelet     Status: Abnormal   Collection Time: 12/26/17  4:43 AM  Result Value Ref Range   WBC 12.8 (H) 4.0 - 10.5 K/uL   RBC 4.77 4.22 - 5.81 MIL/uL   Hemoglobin 14.9 13.0 - 17.0 g/dL   HCT 42.2 39.0 - 52.0 %   MCV 88.5 78.0 - 100.0 fL   MCH 31.2 26.0 - 34.0 pg   MCHC 35.3 30.0 - 36.0 g/dL   RDW 13.5 11.5 - 15.5 %   Platelets 351 150 - 400 K/uL   Neutrophils Relative % 79 %   Neutro Abs 10.1 (H) 1.7 - 7.7 K/uL   Lymphocytes Relative 10 %   Lymphs Abs 1.3 0.7 - 4.0 K/uL   Monocytes Relative 11 %   Monocytes Absolute 1.4 (H) 0.1 - 1.0 K/uL   Eosinophils Relative 0 %   Eosinophils Absolute 0.0 0.0 - 0.7 K/uL   Basophils Relative 0 %   Basophils Absolute 0.0 0.0 - 0.1 K/uL  Magnesium     Status: None   Collection Time: 12/26/17  4:43 AM  Result Value Ref Range   Magnesium 2.4 1.7 - 2.4 mg/dL     Studies/Results: No results found.  Marland Kitchen apixaban  5 mg Oral BID  . budesonide-formoterol  2 puff Inhalation BID  . chlorhexidine  15 mL Mouth Rinse BID  . isosorbide-hydrALAZINE  1 tablet Oral TID  . lacosamide  75 mg Oral BID  . mouth rinse  15 mL Mouth Rinse q12n4p  . metoprolol succinate  25 mg Oral Daily  . metoprolol succinate  50 mg Oral QPM  . mexiletine  150 mg Oral BID  . ondansetron (ZOFRAN) IV  4 mg Intravenous Once  . polyethylene glycol  17 g Oral Daily  . ranolazine  500 mg Oral Daily  . sotalol  80 mg Oral Daily  . tamsulosin  0.4 mg Oral QHS     Assessment/Plan: s/p Procedure(s): APPENDECTOMY LAPAROSCOPIC  Ruptured appendicitis with localized abscess POD #2 - laparoscopic appendectomy  mobilize. PT consult Clear liquid diet Needs antibiotics for 10 days Continue IV fluid support Check pathology  Nausea and vomiting.  Probably still a component of ileus.  Will not advance diet beyond clear liquids today.  Discontinue narcotic.   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 .prob

## 2017-12-26 NOTE — Plan of Care (Signed)
VSS, patient medicated for nausea x 2 on 7 a to 7 p shift with improvement.  Denies abdominal pain.  Patient noted to be belching frequently and states he is passing gas out his rectum.  No BM, no vomiting this shift.

## 2017-12-26 NOTE — Progress Notes (Signed)
Pharmacy Antibiotic Note  [name] is a 72 y.o. male admitted on 12/21/2017 with ruptured appendicitis with localized abscess. Pharmacy has been consulted for piperacillin/tazobactam dosing.   Patient is POD#2 laparoscopic appendectomy. This is day #5 of IV antibiotics. Planning for 10 day course per MD notes.   Today, 12/26/17  WBC 12.8 trending down  SCr 1.6 trending down but is close to baseline  Afebrile  Plan:  Continue piperacillin/tazobactam 3.375 g IV q8h EI  Pharmacy to sign off as no dose adjustment necessary  Height: 5\' 9"  (175.3 cm) Weight: 182 lb 11.2 oz (82.9 kg) IBW/kg (Calculated) : 70.7  Temp (24hrs), Avg:98.9 F (37.2 C), Min:98.6 F (37 C), Max:99.3 F (37.4 C)  Recent Labs  Lab 12/21/17 2153 12/22/17 0043 12/23/17 0442 12/23/17 1838 12/24/17 0709 12/25/17 0448 12/26/17 0443  WBC  --  10.5 16.2*  --  16.2* 13.4* 12.8*  CREATININE  --  1.38* 1.58* 1.60* 1.38* 1.80* 1.63*  LATICACIDVEN 1.4 1.5  --   --   --   --   --     Estimated Creatinine Clearance: 41.6 mL/min (A) (by C-G formula based on SCr of 1.63 mg/dL (H)).    Allergies  Allergen Reactions  . Aricept [Donepezil Hcl] Other (See Comments)    Worsens renal function  . Codeine Hives  . Nexium [Esomeprazole] Other (See Comments)    Severely worsens renal function  . Omeprazole Hives  . Pantoprazole Sodium Other (See Comments)    Renal failure  . Tramadol Nausea And Vomiting   Antimicrobials this admission: 8/2 piperacillin/tazobactam >>   Microbiology results: 8/5 MRSA PCR: Negative  Thank you for allowing pharmacy to be a part of this patient's care.  Lenis Noon, PharmD 12/26/17 8:44 AM

## 2017-12-26 NOTE — Discharge Instructions (Signed)

## 2017-12-26 NOTE — Progress Notes (Signed)
This RN has encouraged patient multiple times to walk in the halls as this is the best thing for the motility of his stomach but patient has repeatedly refused.  Denies pain in abdomen.

## 2017-12-26 NOTE — Progress Notes (Addendum)
Progress Note  Patient Name: [name] Date of Encounter: 12/26/2017  Primary Cardiologist: Glori Bickers, MD  Subjective   Overall feeling okay today, just no energy. Denies any abd pain. No CP, edema, SOB.  Inpatient Medications    Scheduled Meds: . apixaban  5 mg Oral BID  . budesonide-formoterol  2 puff Inhalation BID  . chlorhexidine  15 mL Mouth Rinse BID  . isosorbide-hydrALAZINE  1 tablet Oral TID  . lacosamide  75 mg Oral BID  . mouth rinse  15 mL Mouth Rinse q12n4p  . metoprolol succinate  25 mg Oral Daily  . metoprolol succinate  50 mg Oral QPM  . mexiletine  150 mg Oral BID  . polyethylene glycol  17 g Oral Daily  . ranolazine  500 mg Oral Daily  . sotalol  80 mg Oral Daily  . tamsulosin  0.4 mg Oral QHS   Continuous Infusions: . sodium chloride    . piperacillin-tazobactam (ZOSYN)  IV 3.375 g (12/26/17 0344)   PRN Meds: sodium chloride, acetaminophen, acetaminophen, albuterol, gabapentin, ondansetron **OR** ondansetron (ZOFRAN) IV   Vital Signs    Vitals:   12/25/17 2206 12/26/17 0500 12/26/17 0603 12/26/17 0920  BP: (!) 141/78  (!) 143/78   Pulse: 85  70   Resp: 20  16   Temp: 99.3 F (37.4 C)  98.6 F (37 C)   TempSrc: Oral  Oral   SpO2: 96%  96% 96%  Weight:  182 lb 11.2 oz (82.9 kg)    Height:        Intake/Output Summary (Last 24 hours) at 12/26/2017 0952 Last data filed at 12/26/2017 0646 Gross per 24 hour  Intake 367.39 ml  Output 1330 ml  Net -962.61 ml   Filed Weights   12/24/17 1421 12/25/17 1700 12/26/17 0500  Weight: 184 lb (83.5 kg) 198 lb 3.2 oz (89.9 kg) 182 lb 11.2 oz (82.9 kg)    Telemetry    NSR but QT appears prolonged - Personally Reviewed  Physical Exam   GEN: No acute distress.  HEENT: Normocephalic, atraumatic, sclera non-icteric. Neck: No JVD or bruits. Cardiac: RRR no murmurs, rubs, or gallops.  Radials/DP/PT 1+ and equal bilaterally.  Respiratory: Clear to auscultation bilaterally but requires  quite a bit of effort to sit forward. Breathing is unlabored. GI: Rounded, positive BS +x 4. MS: no deformity. Extremities: No clubbing or cyanosis. No edema. Distal pedal pulses are 2+ and equal bilaterally. Neuro:  AAOx3. Follows commands. Psych:  Responds to questions appropriately with a normal affect.  Labs    Chemistry Recent Labs  Lab 12/21/17 1633  12/24/17 0709 12/25/17 0448 12/26/17 0443  NA 139   < > 135 139 139  K 3.8   < > 4.3 4.1 4.0  CL 103   < > 98 101 98  CO2 28   < > 29 27 29   GLUCOSE 131*   < > 126* 155* 197*  BUN 16   < > 11 21 26*  CREATININE 1.49*   < > 1.38* 1.80* 1.63*  CALCIUM 9.0   < > 8.8* 8.7* 9.2  PROT 7.2  --   --   --  6.9  ALBUMIN 4.1  --   --   --  3.0*  AST 24  --   --   --  18  ALT 34  --   --   --  16  ALKPHOS 42  --   --   --  30*  BILITOT 0.7  --   --   --  0.8  GFRNONAA 45*   < > 50* 36* 41*  GFRAA 53*   < > 58* 42* 47*  ANIONGAP 8   < > 8 11 12    < > = values in this interval not displayed.     Hematology Recent Labs  Lab 12/24/17 0709 12/25/17 0448 12/26/17 0443  WBC 16.2* 13.4* 12.8*  RBC 4.90 4.57 4.77  HGB 15.5 14.2 14.9  HCT 44.9 41.2 42.2  MCV 91.6 90.2 88.5  MCH 31.6 31.1 31.2  MCHC 34.5 34.5 35.3  RDW 13.5 13.6 13.5  PLT 212 305 351    Cardiac EnzymesNo results for input(s): TROPONINI in the last 168 hours. No results for input(s): TROPIPOC in the last 168 hours.   BNPNo results for input(s): BNP, PROBNP in the last 168 hours.   DDimer No results for input(s): DDIMER in the last 168 hours.   Radiology    No results found.  Patient Profile     72 y.o. 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  Assessment & Plan    1. Acute appendicitis - being managed by primary teams/surgery.  2. Paroxysmal atrial fibrillation - Maintaining sinus rhythm on continued sotalol. Eliquis resumed.  3. Chronic combined systolic and diastolic CHF - Bidil resumed at 1 tablet TID. Entresto and Lasix on hold. Cr appears to be downtrending. Weights a little inconsistent in Epic. I will review resumption of Entresto/Lasix with MD - has the blood pressure to do so if desired. CKD noted.  4. Paroxysmal ventricular tachycardia - per notes, had ICD shock 11/12/17. He had been seen in the office 5/29 by Dr. Rayann Heman for interim VT treated with ATP and repeat ablation was recommended. However, Dr. Jackalyn Lombard addendum to that note states " Prior to leaving the office, the patient's wife called on the phone.   She was not at the visit today. Upon discussion with the patient, she states "Dr Haroldine Laws says that he does not need an ablation.  These doctors need to get together before we do anything."  The patient subsequently decided to cancel his ablation." I do not specifically see that Dr. Haroldine Laws ever said this was the case as his last OV 10/08/17 stated "wife convinced that shoulder pain is related to mexilitene and I discussed with her the fact that I doubt this is a SE from Elsie but rather likely has a cervical disk.  Continue current regimen for now. Discussed with Dr. Rayann Heman previously who feels as if he has had very good response to VT ablation in setting of large LV scar. Per Dr. Rayann Heman, if VT becomes more frequent can consider repeat ablation." Had short run in hospital but otherwise quiescent. QT appears to be prolonged on tele this AM. Lytes are fine. Will get EKG and discuss recs with Dr. Radford Pax. Addendum: tele is red herring. QTc stable at 478ms by EKG otherwise appears similar to prior.  5. CAD - cath 10/2016 stable. No anginal symptoms. Continue BB. Resume statin when OK with  surgery/primary team.   6. CKD III - baseline Cr appears variable from 1.5-1.9, values similar to his admission. This appears to be downtrending.  For  questions or updates, please contact [place] Please consult [email or web address] for contact info under Cardiology/STEMI.  Signed, Charlie Pitter, PA-C 12/26/2017, 9:52 AM

## 2017-12-26 NOTE — Progress Notes (Signed)
PROGRESS NOTE  [name]  JSH:[id number] DOB: 09/10/1945 DOA: 12/21/2017 PCP: Caleb Peng, NP   Brief Narrative:  [name] a 73 y.o. male with a past medical history significant for PAF on eliquis, chronic systolic CHF EF 85%. ICD implant, COPD, Recurrent VT s/p ablation 2018, seizure disorder, CKD, and hypertension. He presented to the emergency department on 8/2 with a 2 day history of nausea, vomiting, and diffuse abdominal pain that localized to the RLQ. CT in the ED showed perforated appendix. WBC 11.6. He was placed on IV Zosyn, eliquis held, and admitted to the hospital with diagnosis of acute appendicitis. General surgery was consulted.   Subjective: Patient had one episode of emesis this morning. He is complaining of nausea since this morning. No bowel movement yet, passing gas. He reports that he is tolerating his liquid diet well. He walked around the halls this morning. He denies an CP, SOB, palpitations, edema, dizziness or lightheadedness.   Assessment & Plan:  Acute Appendicitis with perforation s/p laparoscopic appendectomy 8/5 - Following with general surgery - originally conservative management. Cardiology was consulted for preoperative clearance - high risk patient. Repeat CT 8/5 showed appendicitis w/fecalith. Laparoscopic appendectomy 8/5. - Gen Surg discontinued narcotics. He has a JP drain in place w/serosanguinous output. Awaiting bowel function secondary to Ileus component.  - Tylenol prn for pain. Zofran not controlling nausea well - will add pepcid and reglan. - Continue IV Zosyn. Patient will need a total of 10 days of antibiotics - today is day #5 - Advance diet per general surgery. - He appears quite weak - PT evaluation  Cardiac Chronic systolic CHF with EF 02% Atrial Fibrillation on Eliquis  Recurrent VT s/p ablation 2018 AICD implant CAD Hypertension  - Followed with cardiology who was consulted for preoperative clearance - No VT during hospital  stay. Increased BiDil back to home dose and restarted lasix today. Cardiology signed off.  - Eliquis was restarted on 8/6. He has remained in NSR.   CKD Stage III - Baseline appears to be 1.5-1.9, which he has maintained throughout his admission. 1.63 today. Monitor with daily BMP.  Chronic COPD  - Appears stable. Continue symbicort and albuterol prn  DVT prophylaxis: Eliquis  Code Status: FULL Family Communication: No family at bedside Disposition Plan: Likely 1-2 days pending general surgery recommendation.   Consultants:   Cardiology   General Surgery   Physical Therapy    Procedures:   Laparoscopic Appendectomy 8/5   Antimicrobials:   Zosyn 8/2 -->  Objective: Vitals:   12/25/17 2206 12/26/17 0500 12/26/17 0603 12/26/17 0920  BP: (!) 141/78  (!) 143/78   Pulse: 85  70   Resp: 20  16   Temp: 99.3 F (37.4 C)  98.6 F (37 C)   TempSrc: Oral  Oral   SpO2: 96%  96% 96%  Weight:  82.9 kg (182 lb 11.2 oz)    Height:        Intake/Output Summary (Last 24 hours) at 12/26/2017 1027 Last data filed at 12/26/2017 0646 Gross per 24 hour  Intake 367.39 ml  Output 1330 ml  Net -962.61 ml   Filed Weights   12/24/17 1421 12/25/17 1700 12/26/17 0500  Weight: 83.5 kg (184 lb) 89.9 kg (198 lb 3.2 oz) 82.9 kg (182 lb 11.2 oz)    Examination: General appearance: adult male sitting in chair, awake and alert. NAD.   HEENT: Anicteric, conjunctiva pink, mucous membranes moist..   Cardiac: RRR, nl S1-S2, no murmurs appreciated.  No LE edema. No cyanosis. Distal pulses are intact bilaterally. Respiratory: Normal respiratory rate and rhythm. CTAB without wheezes, crackles or rales. Abdomen: Abdomen soft and mildly distended. No focal tenderness. Bowel sounds throughout. JP drain intact without erythema, warmth or discharge - serosanguinous output. Neuro: AOx3. Moves all extremities. Speech fluent.     Data Reviewed: I have personally reviewed following labs and imaging  studies:  CBC: Recent Labs  Lab 12/22/17 0043 12/23/17 0442 12/24/17 0709 12/25/17 0448 12/26/17 0443  WBC 10.5 16.2* 16.2* 13.4* 12.8*  NEUTROABS  --  12.4*  --   --  10.1*  HGB 15.4 14.6 15.5 14.2 14.9  HCT 45.1 42.3 44.9 41.2 42.2  MCV 91.3 90.4 91.6 90.2 88.5  PLT 241 210 212 305 762   Basic Metabolic Panel: Recent Labs  Lab 12/23/17 0442 12/23/17 1838 12/24/17 0709 12/25/17 0448 12/26/17 0443  NA 135 133* 135 139 139  K 3.9 3.9 4.3 4.1 4.0  CL 98 97* 98 101 98  CO2 27 26 29 27 29   GLUCOSE 139* 140* 126* 155* 197*  BUN 12 11 11 21  26*  CREATININE 1.58* 1.60* 1.38* 1.80* 1.63*  CALCIUM 8.5* 8.4* 8.8* 8.7* 9.2  MG  --  1.7  --   --  2.4   GFR: Estimated Creatinine Clearance: 41.6 mL/min (A) (by C-G formula based on SCr of 1.63 mg/dL (H)). Liver Function Tests: Recent Labs  Lab 12/21/17 1633 12/26/17 0443  AST 24 18  ALT 34 16  ALKPHOS 42 30*  BILITOT 0.7 0.8  PROT 7.2 6.9  ALBUMIN 4.1 3.0*   Recent Labs  Lab 12/21/17 1633  LIPASE 57*   Coagulation Profile: Recent Labs  Lab 12/21/17 1918  INR 1.16   Urine analysis:    Component Value Date/Time   COLORURINE YELLOW 12/21/2017 1918   APPEARANCEUR CLEAR 12/21/2017 1918   APPEARANCEUR Clear 09/06/2015 1053   LABSPEC 1.041 (H) 12/21/2017 1918   PHURINE 5.0 12/21/2017 1918   GLUCOSEU NEGATIVE 12/21/2017 1918   HGBUR NEGATIVE 12/21/2017 1918   BILIRUBINUR NEGATIVE 12/21/2017 1918   BILIRUBINUR n 01/03/2016 1014   BILIRUBINUR Negative 09/06/2015 1053   KETONESUR NEGATIVE 12/21/2017 1918   PROTEINUR NEGATIVE 12/21/2017 1918   UROBILINOGEN 0.2 01/03/2016 1014   UROBILINOGEN 2.0 (H) 02/24/2011 2051   NITRITE NEGATIVE 12/21/2017 1918   LEUKOCYTESUR NEGATIVE 12/21/2017 1918   LEUKOCYTESUR Negative 09/06/2015 1053   Recent Results (from the past 240 hour(s))  MRSA PCR Screening     Status: None   Collection Time: 12/24/17 11:12 AM  Result Value Ref Range Status   MRSA by PCR NEGATIVE NEGATIVE  Final    Comment:        The GeneXpert MRSA Assay (FDA approved for NASAL specimens only), is one component of a comprehensive MRSA colonization surveillance program. It is not intended to diagnose MRSA infection nor to guide or monitor treatment for MRSA infections. Performed at [facility], [place] [address], [place], [place] [address]     Radiology Studies: No results found.  Scheduled Meds: . apixaban  5 mg Oral BID  . budesonide-formoterol  2 puff Inhalation BID  . chlorhexidine  15 mL Mouth Rinse BID  . isosorbide-hydrALAZINE  1 tablet Oral TID  . lacosamide  75 mg Oral BID  . mouth rinse  15 mL Mouth Rinse q12n4p  . metoprolol succinate  25 mg Oral Daily  . metoprolol succinate  50 mg Oral QPM  . mexiletine  150 mg  Oral BID  . polyethylene glycol  17 g Oral Daily  . ranolazine  500 mg Oral Daily  . sotalol  80 mg Oral Daily  . tamsulosin  0.4 mg Oral QHS   Continuous Infusions: . sodium chloride    . piperacillin-tazobactam (ZOSYN)  IV 3.375 g (12/26/17 0344)    LOS: 5 days   \Rudene Poulsen, PA-S [facility] 12/26/2017, 10:27 AM   [email or web address] Password [email or web address] If 7PM-7AM, please contact night-coverage

## 2017-12-27 LAB — CBC WITH DIFFERENTIAL/PLATELET
BASOS ABS: 0 10*3/uL (ref 0.0–0.1)
Basophils Relative: 0 %
Eosinophils Absolute: 0.1 10*3/uL (ref 0.0–0.7)
Eosinophils Relative: 1 %
HEMATOCRIT: 40.8 % (ref 39.0–52.0)
HEMOGLOBIN: 14.1 g/dL (ref 13.0–17.0)
Lymphocytes Relative: 13 %
Lymphs Abs: 1.5 10*3/uL (ref 0.7–4.0)
MCH: 30.8 pg (ref 26.0–34.0)
MCHC: 34.6 g/dL (ref 30.0–36.0)
MCV: 89.1 fL (ref 78.0–100.0)
Monocytes Absolute: 1.3 10*3/uL — ABNORMAL HIGH (ref 0.1–1.0)
Monocytes Relative: 12 %
NEUTROS ABS: 8.4 10*3/uL — AB (ref 1.7–7.7)
NEUTROS PCT: 74 %
Platelets: 417 10*3/uL — ABNORMAL HIGH (ref 150–400)
RBC: 4.58 MIL/uL (ref 4.22–5.81)
RDW: 13.5 % (ref 11.5–15.5)
WBC: 11.3 10*3/uL — AB (ref 4.0–10.5)

## 2017-12-27 LAB — BASIC METABOLIC PANEL
ANION GAP: 11 (ref 5–15)
BUN: 27 mg/dL — ABNORMAL HIGH (ref 8–23)
CO2: 29 mmol/L (ref 22–32)
Calcium: 8.8 mg/dL — ABNORMAL LOW (ref 8.9–10.3)
Chloride: 99 mmol/L (ref 98–111)
Creatinine, Ser: 1.52 mg/dL — ABNORMAL HIGH (ref 0.61–1.24)
GFR calc Af Amer: 51 mL/min — ABNORMAL LOW (ref >60)
GFR, EST NON AFRICAN AMERICAN: 44 mL/min — AB (ref >60)
Glucose, Bld: 195 mg/dL — ABNORMAL HIGH (ref 70–99)
Potassium: 3.9 mmol/L (ref 3.5–5.1)
SODIUM: 139 mmol/L (ref 135–145)

## 2017-12-27 LAB — MAGNESIUM: MAGNESIUM: 2.6 mg/dL — AB (ref 1.7–2.4)

## 2017-12-27 MED ORDER — BISACODYL 10 MG RE SUPP
10.0000 mg | Freq: Two times a day (BID) | RECTAL | Status: AC
  Administered 2017-12-27: 10 mg via RECTAL
  Filled 2017-12-27 (×3): qty 1

## 2017-12-27 NOTE — Progress Notes (Signed)
3 Days Post-Op  Subjective: Feeling a good deal better.  Nausea and vomiting have resolved.  Tolerating clear liquids.  Passing flatus.  No stool.  Still slightly distended. JP drain thin, serosanguineous but high volume Pathology shows acute appendicitis, no malignancy  WBC down to 11,300.  Hemoglobin stable at 14.1.  Potassium 3.9.  Glucose 195.  Creatinine 1.52 all basically at baseline.  Objective: Vital signs in last 24 hours: Temp:  [98 F (36.7 C)-99.2 F (37.3 C)] 99.2 F (37.3 C) (08/08 0416) Pulse Rate:  [59-71] 65 (08/08 0416) Resp:  [16] 16 (08/08 0416) BP: (121-135)/(62-87) 135/87 (08/08 0416) SpO2:  [94 %-97 %] 97 % (08/08 0416) Weight:  [83 kg] 83 kg (08/08 0416) Last BM Date: 12/20/17  Intake/Output from previous day: 08/07 0701 - 08/08 0700 In: 1213.6 [P.O.:120; I.V.:863.3; IV Piggyback:230.3] Out: 1050 [Urine:450; Drains:600] Intake/Output this shift: Total I/O In: 1126.9 [P.O.:120; I.V.:863.3; IV Piggyback:143.6] Out: 920 [Urine:450; Drains:470]  EXAM: General appearance:Alert. Appropriate. Looks better.  Oriented. Deconditioned. Resp:Clear anteriorly without wheeze or rhonchi IO:EVOJ.   I can hear bowel sounds.  Borderline distended.  Wounds look fine. Drainage serosanguineous. Mild appropriate tenderness. Drainage serosanguineous, clear.  Nonenteric.    Lab Results:  Results for orders placed or performed during the hospital encounter of 12/21/17 (from the past 24 hour(s))  Basic metabolic panel     Status: Abnormal   Collection Time: 12/27/17  4:30 AM  Result Value Ref Range   Sodium 139 135 - 145 mmol/L   Potassium 3.9 3.5 - 5.1 mmol/L   Chloride 99 98 - 111 mmol/L   CO2 29 22 - 32 mmol/L   Glucose, Bld 195 (H) 70 - 99 mg/dL   BUN 27 (H) 8 - 23 mg/dL   Creatinine, Ser 1.52 (H) 0.61 - 1.24 mg/dL   Calcium 8.8 (L) 8.9 - 10.3 mg/dL   GFR calc non Af Amer 44 (L) >60 mL/min   GFR calc Af Amer 51 (L) >60 mL/min   Anion gap 11 5 - 15   Magnesium     Status: Abnormal   Collection Time: 12/27/17  4:30 AM  Result Value Ref Range   Magnesium 2.6 (H) 1.7 - 2.4 mg/dL  CBC with Differential/Platelet     Status: Abnormal   Collection Time: 12/27/17  4:30 AM  Result Value Ref Range   WBC 11.3 (H) 4.0 - 10.5 K/uL   RBC 4.58 4.22 - 5.81 MIL/uL   Hemoglobin 14.1 13.0 - 17.0 g/dL   HCT 40.8 39.0 - 52.0 %   MCV 89.1 78.0 - 100.0 fL   MCH 30.8 26.0 - 34.0 pg   MCHC 34.6 30.0 - 36.0 g/dL   RDW 13.5 11.5 - 15.5 %   Platelets 417 (H) 150 - 400 K/uL   Neutrophils Relative % 74 %   Neutro Abs 8.4 (H) 1.7 - 7.7 K/uL   Lymphocytes Relative 13 %   Lymphs Abs 1.5 0.7 - 4.0 K/uL   Monocytes Relative 12 %   Monocytes Absolute 1.3 (H) 0.1 - 1.0 K/uL   Eosinophils Relative 1 %   Eosinophils Absolute 0.1 0.0 - 0.7 K/uL   Basophils Relative 0 %   Basophils Absolute 0.0 0.0 - 0.1 K/uL     Studies/Results: No results found.  Marland Kitchen apixaban  5 mg Oral BID  . bisacodyl  10 mg Rectal BID  . budesonide-formoterol  2 puff Inhalation BID  . chlorhexidine  15 mL Mouth Rinse BID  . famotidine  20 mg Oral Daily  . furosemide  20 mg Oral Once per day on Mon Wed Fri  . isosorbide-hydrALAZINE  2 tablet Oral TID  . lacosamide  75 mg Oral BID  . mouth rinse  15 mL Mouth Rinse q12n4p  . metoprolol succinate  25 mg Oral Daily  . metoprolol succinate  50 mg Oral QPM  . mexiletine  150 mg Oral BID  . polyethylene glycol  17 g Oral Daily  . ranolazine  500 mg Oral Daily  . sacubitril-valsartan  1 tablet Oral BID  . sotalol  80 mg Oral Daily  . tamsulosin  0.4 mg Oral QHS     Assessment/Plan: s/p Procedure(s): APPENDECTOMY LAPAROSCOPIC  Ruptured appendicitis with localized abscess POD #3 -laparoscopic appendectomy  mobilize. PT consult Advance to full liquid diet Dulcolax suppository Needs antibiotics for 10 days-end 01/02/2018 Continue IV fluid support Pathology benign, acute appendicitis  Nausea and vomiting.    Resolved.  Ileus  slowly resolving.  On Reglan and Dulcolax.  Full liquid diet today  Atrial fibrillation on chronic Eliquis-Eliquis restarted 8/7 Combined chronic systolic and diastolic CHF with EF 67% History CVA history CKD History of myocardial infarction Hypertension AICD in place CAD History ventricular tachycardia last summer Prior periumbilical hernia repair History seizures   @PROBHOSP @  LOS: 6 days    [name] M Elleni Mozingo 12/27/2017  . .prob

## 2017-12-27 NOTE — Progress Notes (Signed)
Physical Therapy Treatment Patient Details Name: [name] MRN: [id number] DOB: [birth year] Today's Date: 12/27/2017    History of Present Illness y.o. male with a past medical history significant for PAF on eliquis, chronic systolic CHF EF 40%. ICD implant, COPD, Recurrent VT s/p ablation 2018, seizure disorder, CKD, and hypertension and admitted for Acute Appendicitis with perforation s/p laparoscopic appendectomy 8/5    PT Comments    Pt appears in better spirits today and agreeable to ambulate.  Pt progressed to using walking stick from home and had better upright posture today.  Pt hopeful for d/c home by this weekend.  Follow Up Recommendations  Home health PT     Equipment Recommendations  None recommended by PT    Recommendations for Other Services       Precautions / Restrictions Precautions Precautions: Fall Precaution Comments: L drain    Mobility  Bed Mobility Overal bed mobility: Needs Assistance Bed Mobility: Supine to Sit;Sit to Supine     Supine to sit: Supervision Sit to supine: Supervision   General bed mobility comments: increased time and effort  Transfers Overall transfer level: Needs assistance Equipment used: None Transfers: Sit to/from Stand Sit to Stand: Min guard         General transfer comment: min/guard for safety  Ambulation/Gait Ambulation/Gait assistance: Min guard Gait Distance (Feet): 400 Feet Assistive device: Straight cane Gait Pattern/deviations: Step-through pattern;Decreased stride length     General Gait Details: steady with walking stick today, able to perform upright posture today   Stairs             Wheelchair Mobility    Modified Rankin (Stroke Patients Only)       Balance                                            Cognition Arousal/Alertness: Awake/alert Behavior During Therapy: WFL for tasks assessed/performed Overall Cognitive Status: Within Functional Limits for  tasks assessed                                        Exercises      General Comments        Pertinent Vitals/Pain Pain Assessment: No/denies pain    Home Living                      Prior Function            PT Goals (current goals can now be found in the care plan section) Progress towards PT goals: Progressing toward goals    Frequency    Min 3X/week      PT Plan Current plan remains appropriate;Equipment recommendations need to be updated    Co-evaluation              AM-PAC PT "6 Clicks" Daily Activity  Outcome Measure  Difficulty turning over in bed (including adjusting bedclothes, sheets and blankets)?: A Lot Difficulty moving from lying on back to sitting on the side of the bed? : A Lot Difficulty sitting down on and standing up from a chair with arms (e.g., wheelchair, bedside commode, etc,.)?: A Lot Help needed moving to and from a bed to chair (including a wheelchair)?: A Little Help needed walking in hospital room?: A Little Help needed  climbing 3-5 steps with a railing? : A Lot 6 Click Score: 14    End of Session   Activity Tolerance: Patient tolerated treatment well Patient left: with family/visitor present;with call bell/phone within reach;in bed   PT Visit Diagnosis: Difficulty in walking, not elsewhere classified (R26.2)     Time: 4035-2481 PT Time Calculation (min) (ACUTE ONLY): 11 min  Charges:  $Gait Training: 8-22 mins                    Carmelia Bake, PT, DPT 12/27/2017 Pager: [phone]  York Ram E 12/27/2017, 3:13 PM

## 2017-12-27 NOTE — Care Management Important Message (Signed)
Important Message  Patient Details  Name: Tywan Siever MRN: [id number] Date of Birth: [birth year]   Medicare Important Message Given:  Yes    Kerin Salen 12/27/2017, 12:19 [place] Message  Patient Details  Name: Vikram Tillett MRN: [id number] Date of Birth: [birth year]   Medicare Important Message Given:  Yes    Kerin Salen 12/27/2017, 12:19 PM

## 2017-12-27 NOTE — Progress Notes (Addendum)
PROGRESS NOTE  [name]  GEX:[id number] DOB: 06-19-1945 DOA: 12/21/2017 PCP: Dorothyann Peng, NP   Brief Narrative:  [name] a 72 y.o. male with a past medical history significant for PAF on eliquis, chronic systolic CHF EF 01%. ICD implant, COPD, Recurrent VT s/p ablation 2018, seizure disorder, CKD, and hypertension. He presented to the emergency department on 8/2 with a 2 day history of nausea, vomiting, and diffuse abdominal pain that localized to the RLQ. CT in the ED showed perforated appendix. WBC 11.6. He was placed on IV Zosyn, eliquis held, and admitted to the hospital with diagnosis of acute appendicitis. General surgery was consulted.   Subjective: Patient is doing better this morning. His nausea has resolved and he has had no further episodes of vomiting. He is still complaining of persistent belching. Still no BM, but is passing gas. No CP, SOB, palpitations, fevers/chills.  Assessment & Plan:  Acute Appendicitis with perforation s/p laparoscopic appendectomy 8/5 - Following with general surgery - originally conservative management. Cardiology was consulted for preoperative clearance - high risk patient. Repeat CT 8/5 showed appendicitis w/fecalith. Laparoscopic appendectomy 8/5. - He has a JP drain in place still with significant serosanguinous output. Awaiting bowel function secondary to Ileus component.  - Tylenol prn for pain. Continue Zofran, pepcid and reglan for nausea. - Continue IV Zosyn. Patient will need a total of 10 days of antibiotics - today is day #6 - Advance diet per general surgery - full liquids today. - Working with PT.   Cardiac Chronic systolic CHF with EF 02% Atrial Fibrillation on Eliquis  Recurrent VT s/p ablation 2018 AICD implant CAD Hypertension  - Followed with cardiology who was consulted for preoperative clearance - No VT during hospital stay. Cardiology signed off. Patient is back on his home regimen of cardiac medications. -  Eliquis was restarted on 8/6. He has remained in NSR.  -  Monitor for signs of fluid overload.  CKD Stage III - Baseline appears to be 1.5-1.9, which he has maintained throughout his admission. 1.52 today. Monitor with daily BMP.  Chronic COPD  - Appears stable. Continue symbicort and albuterol prn  DVT prophylaxis: Eliquis Code Status: FULL Family Communication: No family at bedside Disposition Plan: Home in likely 1-2 days pending general surgery recommendation.   Consultants:   Cardiology   General Surgery   Physical Therapy     Procedures:   Laparoscopic Appendectomy 8/5   Antimicrobials:   Zosyn 8/2 -->  Objective: Vitals:   12/26/17 1301 12/26/17 1758 12/26/17 2119 12/27/17 0416  BP: 121/69 130/69 126/62 135/87  Pulse: 71 70 (!) 59 65  Resp: 16  16 16   Temp: 99.1 F (37.3 C)  98 F (36.7 C) 99.2 F (37.3 C)  TempSrc: Oral  Oral Oral  SpO2: 94%  94% 97%  Weight:    83 kg  Height:        Intake/Output Summary (Last 24 hours) at 12/27/2017 0852 Last data filed at 12/27/2017 0618 Gross per 24 hour  Intake 1213.62 ml  Output 1050 ml  Net 163.62 ml   Filed Weights   12/25/17 1700 12/26/17 0500 12/27/17 0416  Weight: 89.9 kg 82.9 kg 83 kg    Examination: General appearance: adult male sitting in chair, awake and alert. NAD.   HEENT: Anicteric, conjunctiva pink, mucous membranes moist..   Cardiac: RRR, nl S1-S2, no murmurs appreciated. No LE edema. No cyanosis. Distal pulses are intact bilaterally. Respiratory: Normal respiratory rate and rhythm. CTAB without wheezes,  crackles or rales. Abdomen: Abdomen soft and mildly distended. No focal tenderness. Bowel sounds throughout. JP drain intact without erythema, warmth or discharge - serosanguinous output. Neuro: AOx3. Moves all extremities. Speech fluent.     Data Reviewed: I have personally reviewed following labs and imaging studies:  CBC: Recent Labs  Lab 12/23/17 0442 12/24/17 0709 12/25/17 0448  12/26/17 0443 12/27/17 0430  WBC 16.2* 16.2* 13.4* 12.8* 11.3*  NEUTROABS 12.4*  --   --  10.1* 8.4*  HGB 14.6 15.5 14.2 14.9 14.1  HCT 42.3 44.9 41.2 42.2 40.8  MCV 90.4 91.6 90.2 88.5 89.1  PLT 210 212 305 351 073*   Basic Metabolic Panel: Recent Labs  Lab 12/23/17 1838 12/24/17 0709 12/25/17 0448 12/26/17 0443 12/27/17 0430  NA 133* 135 139 139 139  K 3.9 4.3 4.1 4.0 3.9  CL 97* 98 101 98 99  CO2 26 29 27 29 29   GLUCOSE 140* 126* 155* 197* 195*  BUN 11 11 21  26* 27*  CREATININE 1.60* 1.38* 1.80* 1.63* 1.52*  CALCIUM 8.4* 8.8* 8.7* 9.2 8.8*  MG 1.7  --   --  2.4 2.6*   GFR: Estimated Creatinine Clearance: 44.6 mL/min (A) (by C-G formula based on SCr of 1.52 mg/dL (H)). Liver Function Tests: Recent Labs  Lab 12/21/17 1633 12/26/17 0443  AST 24 18  ALT 34 16  ALKPHOS 42 30*  BILITOT 0.7 0.8  PROT 7.2 6.9  ALBUMIN 4.1 3.0*   Recent Labs  Lab 12/21/17 1633  LIPASE 57*   Coagulation Profile: Recent Labs  Lab 12/21/17 1918  INR 1.16   Urine analysis:    Component Value Date/Time   COLORURINE YELLOW 12/21/2017 1918   APPEARANCEUR CLEAR 12/21/2017 1918   APPEARANCEUR Clear 09/06/2015 1053   LABSPEC 1.041 (H) 12/21/2017 1918   PHURINE 5.0 12/21/2017 1918   GLUCOSEU NEGATIVE 12/21/2017 1918   HGBUR NEGATIVE 12/21/2017 1918   BILIRUBINUR NEGATIVE 12/21/2017 1918   BILIRUBINUR n 01/03/2016 1014   BILIRUBINUR Negative 09/06/2015 1053   KETONESUR NEGATIVE 12/21/2017 1918   PROTEINUR NEGATIVE 12/21/2017 1918   UROBILINOGEN 0.2 01/03/2016 1014   UROBILINOGEN 2.0 (H) 02/24/2011 2051   NITRITE NEGATIVE 12/21/2017 1918   LEUKOCYTESUR NEGATIVE 12/21/2017 1918   LEUKOCYTESUR Negative 09/06/2015 1053   Recent Results (from the past 240 hour(s))  MRSA PCR Screening     Status: None   Collection Time: 12/24/17 11:12 AM  Result Value Ref Range Status   MRSA by PCR NEGATIVE NEGATIVE Final    Comment:        The GeneXpert MRSA Assay (FDA approved for NASAL  specimens only), is one component of a comprehensive MRSA colonization surveillance program. It is not intended to diagnose MRSA infection nor to guide or monitor treatment for MRSA infections. Performed at [facility], [place] [address] [place], [place] [address]      Radiology Studies: No results found.  Scheduled Meds: . apixaban  5 mg Oral BID  . bisacodyl  10 mg Rectal BID  . budesonide-formoterol  2 puff Inhalation BID  . chlorhexidine  15 mL Mouth Rinse BID  . famotidine  20 mg Oral Daily  . furosemide  20 mg Oral Once per day on Mon Wed Fri  . isosorbide-hydrALAZINE  2 tablet Oral TID  . lacosamide  75 mg Oral BID  . mouth rinse  15 mL Mouth Rinse q12n4p  . metoprolol succinate  25 mg Oral Daily  . metoprolol succinate  50 mg  Oral QPM  . mexiletine  150 mg Oral BID  . polyethylene glycol  17 g Oral Daily  . ranolazine  500 mg Oral Daily  . sacubitril-valsartan  1 tablet Oral BID  . sotalol  80 mg Oral Daily  . tamsulosin  0.4 mg Oral QHS   Continuous Infusions: . sodium chloride 50 mL/hr at 12/26/17 1302  . piperacillin-tazobactam (ZOSYN)  IV 3.375 g (12/27/17 0343)    LOS: 6 days   Jonus Coble, PA-S [facility] 12/27/2017, 8:52 AM   [email or web address] Password [email or web address] If 7PM-7AM, please contact night-coverage

## 2017-12-28 DIAGNOSIS — K3532 Acute appendicitis with perforation and localized peritonitis, without abscess: Secondary | ICD-10-CM

## 2017-12-28 LAB — BASIC METABOLIC PANEL
ANION GAP: 10 (ref 5–15)
BUN: 29 mg/dL — ABNORMAL HIGH (ref 8–23)
CO2: 30 mmol/L (ref 22–32)
Calcium: 9.1 mg/dL (ref 8.9–10.3)
Chloride: 98 mmol/L (ref 98–111)
Creatinine, Ser: 1.67 mg/dL — ABNORMAL HIGH (ref 0.61–1.24)
GFR calc Af Amer: 46 mL/min — ABNORMAL LOW (ref >60)
GFR, EST NON AFRICAN AMERICAN: 40 mL/min — AB (ref >60)
Glucose, Bld: 239 mg/dL — ABNORMAL HIGH (ref 70–99)
POTASSIUM: 4.1 mmol/L (ref 3.5–5.1)
Sodium: 138 mmol/L (ref 135–145)

## 2017-12-28 LAB — MAGNESIUM: MAGNESIUM: 2.4 mg/dL (ref 1.7–2.4)

## 2017-12-28 MED ORDER — AMOXICILLIN-POT CLAVULANATE 875-125 MG PO TABS: 1.0000 | ORAL_TABLET | Freq: Two times a day (BID) | ORAL | 0 refills | Status: DC

## 2017-12-28 MED ORDER — HYDROCODONE-ACETAMINOPHEN 5-325 MG PO TABS: 1.0000 | ORAL_TABLET | Freq: Four times a day (QID) | ORAL | 0 refills | Status: DC | PRN

## 2017-12-28 MED ORDER — HYDROCODONE-ACETAMINOPHEN 5-325 MG PO TABS: 1.0000 | ORAL_TABLET | Freq: Four times a day (QID) | ORAL | Status: DC | PRN

## 2017-12-28 MED ORDER — AMOXICILLIN-POT CLAVULANATE 875-125 MG PO TABS
1.0000 | ORAL_TABLET | Freq: Two times a day (BID) | ORAL | Status: DC
  Administered 2017-12-28 – 2017-12-29 (×3): 1 via ORAL
  Filled 2017-12-28 (×3): qty 1

## 2017-12-28 MED ORDER — AMOXICILLIN-POT CLAVULANATE 875-125 MG PO TABS: 1.0000 | ORAL_TABLET | Freq: Two times a day (BID) | ORAL | 0 refills | Status: AC

## 2017-12-28 NOTE — Progress Notes (Signed)
PROGRESS NOTE  [name]  FAO:[id number] DOB: 12/16/1945 DOA: 12/21/2017 PCP: Dorothyann Peng, NP   Brief Narrative:  [name] 72 y.o.malewith a past medical history significant for PAF on eliquis, chronic systolic CHF EF 69%. ICD implant, COPD, Recurrent VT s/p ablation 2018, seizure disorder, CKD, and hypertension. Hepresented to the emergency department on 8/2 with a 2 day history of nausea, vomiting, and diffuse abdominal pain that localized to the RLQ. CT in the ED showed perforated appendix. WBC 11.6. He was placed on IV Zosyn, eliquis held, and admitted to the hospital with diagnosis of acute appendicitis. General surgery was consulted.  Subjective: Patient is much improved this morning and in better spirits. He has had 2 formed bowel movements. His nausea and belching have completely resolved. He denies abdominal pain, nausea, CP, SOB, palpitations, or fevers/chills.   Assessment & Plan:  Acute Appendicitis with perforation s/p laparoscopic appendectomy 8/5 - Following with general surgery - originally conservative management. Cardiology was consulted for preoperative clearance - high risk patient. Repeat CT 8/5 showed appendicitis w/fecalith. Taken to OR on 8/5. - He has a JP drain in place still with serosanguinous output. Ileus slowly resolving - he has now had BMs. - Tylenol prn for pain. Continue Zofran, pepcid and reglan for nausea. - Continue IV Zosyn. Patient will need a total of 10 days of antibiotics through 8/14. Plan to transition to PO augmentin tomorrow and remove drain tomorrow morning.  - Diet advanced to soft today. Tolerating well.  - Working with PT - recommend homehealth.  Cardiac Chronic systolic CHF with EF 62% Atrial Fibrillation on Eliquis  Recurrent VT s/p ablation 2018 AICD implant CAD Hypertension - Followed with cardiology who was consulted for preoperative clearance - No VT during hospital stay. Cardiology signed off. Patient is back on  his home regimen of cardiac medications. - Eliquis was restarted on 8/6. He has remained in NSR.  -  Monitor for signs of fluid overload.  CKD Stage III - Baseline appears to be 1.5-1.9, which he has maintained throughout his admission. 1.67 today. Monitor with daily BMP.  Chronic COPD  - Appears stable. Continue symbicortand albuterol prn  DVT prophylaxis: Eliquis Code Status: FULL Family Communication: No family at bedside  Disposition Plan: Plan to discharge home tomorrow after JP drain removed and transitioned to PO antibiotics.   Consultants:   General Surgery   Cardiology   Physical Thearpy   Procedures:   Laparoscopic Appendectomy 8/5  Antimicrobials:   IV Zosyn 8/2 -->   Objective: Vitals:   12/27/17 2008 12/27/17 2057 12/28/17 0353 12/28/17 0816  BP:  (!) 98/52 (!) 133/52   Pulse:  66 (!) 55   Resp:  18 18   Temp:  98.2 F (36.8 C) 98.4 F (36.9 C)   TempSrc:  Oral Oral   SpO2: 94% 91% 96% 98%  Weight:   81.8 kg   Height:        Intake/Output Summary (Last 24 hours) at 12/28/2017 0932 Last data filed at 12/28/2017 0600 Gross per 24 hour  Intake 654.41 ml  Output 275 ml  Net 379.41 ml   Filed Weights   12/26/17 0500 12/27/17 0416 12/28/17 0353  Weight: 82.9 kg 83 kg 81.8 kg    Examination: General appearance:Caleb malesitting in bed, awake and alert. NAD.  HEENT:Anicteric, conjunctiva pink, mucous membranes moist..  Cardiac:RRR, nl S1-S2, no murmurs appreciated. No LE edema. No cyanosis. Distal pulses are intact bilaterally. Respiratory:Normal respiratory rate and rhythm. CTAB without wheezes, crackles  or rales. Abdomen:Abdomen softand mildly distended (improved from yesterday).No focal tenderness. Bowel sounds throughout.JP drain intact without erythema, warmth or discharge - serosanguinous output. Neuro:AOx3. Moves all extremities. Speech fluent.   Data Reviewed: I have personally reviewed following labs and imaging  studies:  CBC: Recent Labs  Lab 12/23/17 0442 12/24/17 0709 12/25/17 0448 12/26/17 0443 12/27/17 0430  WBC 16.2* 16.2* 13.4* 12.8* 11.3*  NEUTROABS 12.4*  --   --  10.1* 8.4*  HGB 14.6 15.5 14.2 14.9 14.1  HCT 42.3 44.9 41.2 42.2 40.8  MCV 90.4 91.6 90.2 88.5 89.1  PLT 210 212 305 351 017*   Basic Metabolic Panel: Recent Labs  Lab 12/23/17 1838 12/24/17 0709 12/25/17 0448 12/26/17 0443 12/27/17 0430 12/28/17 0441  NA 133* 135 139 139 139 138  K 3.9 4.3 4.1 4.0 3.9 4.1  CL 97* 98 101 98 99 98  CO2 26 29 27 29 29 30   GLUCOSE 140* 126* 155* 197* 195* 239*  BUN 11 11 21  26* 27* 29*  CREATININE 1.60* 1.38* 1.80* 1.63* 1.52* 1.67*  CALCIUM 8.4* 8.8* 8.7* 9.2 8.8* 9.1  MG 1.7  --   --  2.4 2.6* 2.4   GFR: Estimated Creatinine Clearance: 40.6 mL/min (A) (by C-G formula based on SCr of 1.67 mg/dL (H)). Liver Function Tests: Recent Labs  Lab 12/21/17 1633 12/26/17 0443  AST 24 18  ALT 34 16  ALKPHOS 42 30*  BILITOT 0.7 0.8  PROT 7.2 6.9  ALBUMIN 4.1 3.0*   Recent Labs  Lab 12/21/17 1633  LIPASE 57*   Coagulation Profile: Recent Labs  Lab 12/21/17 1918  INR 1.16   Urine analysis:    Component Value Date/Time   COLORURINE YELLOW 12/21/2017 1918   APPEARANCEUR CLEAR 12/21/2017 1918   APPEARANCEUR Clear 09/06/2015 1053   LABSPEC 1.041 (H) 12/21/2017 1918   PHURINE 5.0 12/21/2017 1918   GLUCOSEU NEGATIVE 12/21/2017 1918   HGBUR NEGATIVE 12/21/2017 1918   BILIRUBINUR NEGATIVE 12/21/2017 1918   BILIRUBINUR n 01/03/2016 1014   BILIRUBINUR Negative 09/06/2015 1053   KETONESUR NEGATIVE 12/21/2017 1918   PROTEINUR NEGATIVE 12/21/2017 1918   UROBILINOGEN 0.2 01/03/2016 1014   UROBILINOGEN 2.0 (H) 02/24/2011 2051   NITRITE NEGATIVE 12/21/2017 1918   LEUKOCYTESUR NEGATIVE 12/21/2017 1918   LEUKOCYTESUR Negative 09/06/2015 1053   Recent Results (from the past 240 hour(s))  MRSA PCR Screening     Status: None   Collection Time: 12/24/17 11:12 AM  Result  Value Ref Range Status   MRSA by PCR NEGATIVE NEGATIVE Final    Comment:        The GeneXpert MRSA Assay (FDA approved for NASAL specimens only), is one component of a comprehensive MRSA colonization surveillance program. It is not intended to diagnose MRSA infection nor to guide or monitor treatment for MRSA infections. Performed at [facility], [place] [address] [place], [place] [address]    Scheduled Meds: . amoxicillin-clavulanate  1 tablet Oral Q12H  . apixaban  5 mg Oral BID  . budesonide-formoterol  2 puff Inhalation BID  . chlorhexidine  15 mL Mouth Rinse BID  . famotidine  20 mg Oral Daily  . furosemide  20 mg Oral Once per day on Mon Wed Fri  . isosorbide-hydrALAZINE  2 tablet Oral TID  . lacosamide  75 mg Oral BID  . mouth rinse  15 mL Mouth Rinse q12n4p  . metoprolol succinate  25 mg Oral Daily  . metoprolol succinate  50 mg Oral QPM  .  mexiletine  150 mg Oral BID  . polyethylene glycol  17 g Oral Daily  . ranolazine  500 mg Oral Daily  . sacubitril-valsartan  1 tablet Oral BID  . sotalol  80 mg Oral Daily  . tamsulosin  0.4 mg Oral QHS   Continuous Infusions: . sodium chloride 50 mL/hr at 12/26/17 1302    LOS: 7 days   Sumner Kirchman, PA-S [facility] 12/28/2017, 9:32 AM   [email or web address] Password [email or web address] If 7PM-7AM, please contact night-coverage

## 2017-12-28 NOTE — Progress Notes (Addendum)
[name], male   DOB: Jun 15, 1945, 72 y.o.   MRN: [id number]    4 Days Post-Op  Subjective: Patient feels well today.  Passing flatus and having BMs.  Tolerated fulls yesterday.  Eager to go home.  Objective: Vital signs in last 24 hours: Temp:  [98.2 F (36.8 C)-98.9 F (37.2 C)] 98.4 F (36.9 C) (08/09 0353) Pulse Rate:  [55-66] 55 (08/09 0353) Resp:  [16-18] 18 (08/09 0353) BP: (98-133)/(52-69) 133/52 (08/09 0353) SpO2:  [91 %-96 %] 96 % (08/09 0353) Weight:  [81.8 kg] 81.8 kg (08/09 0353) Last BM Date: 12/27/17  Intake/Output from previous day: 08/08 0701 - 08/09 0700 In: 654.4 [P.O.:220; I.V.:200; IV Piggyback:234.4] Out: 275 [Drains:275] Intake/Output this shift: No intake/output data recorded.  PE: Heart: regular Lungs: CTAB Abd: soft, appropriately tender, incisions covered, but clean.  JP with decreasing output around 275cc and serosang.  Lab Results:  Recent Labs    12/26/17 0443 12/27/17 0430  WBC 12.8* 11.3*  HGB 14.9 14.1  HCT 42.2 40.8  PLT 351 417*   BMET Recent Labs    12/27/17 0430 12/28/17 0441  NA 139 138  K 3.9 4.1  CL 99 98  CO2 29 30  GLUCOSE 195* 239*  BUN 27* 29*  CREATININE 1.52* 1.67*  CALCIUM 8.8* 9.1   PT/INR No results for input(s): LABPROT, INR in the last 72 hours. CMP     Component Value Date/Time   NA 138 12/28/2017 0441   NA 139 08/13/2017 1019   K 4.1 12/28/2017 0441   CL 98 12/28/2017 0441   CO2 30 12/28/2017 0441   GLUCOSE 239 (H) 12/28/2017 0441   BUN 29 (H) 12/28/2017 0441   BUN 23 08/13/2017 1019   CREATININE 1.67 (H) 12/28/2017 0441   CREATININE 1.86 (H) 01/26/2016 1144   CALCIUM 9.1 12/28/2017 0441   PROT 6.9 12/26/2017 0443   PROT 7.1 07/12/2016 1024   ALBUMIN 3.0 (L) 12/26/2017 0443   ALBUMIN 4.7 07/12/2016 1024   AST 18 12/26/2017 0443   ALT 16 12/26/2017 0443   ALKPHOS 30 (L) 12/26/2017 0443   BILITOT 0.8 12/26/2017 0443   BILITOT 0.3 07/12/2016 1024   GFRNONAA 40 (L)  12/28/2017 0441   GFRAA 46 (L) 12/28/2017 0441   Lipase     Component Value Date/Time   LIPASE 57 (H) 12/21/2017 1633       Studies/Results: No results found.  Anti-infectives: Anti-infectives (From admission, onward)   Start     Dose/Rate Route Frequency Ordered Stop   12/22/17 0400  piperacillin-tazobactam (ZOSYN) IVPB 3.375 g     3.375 g 12.5 mL/hr over 240 Minutes Intravenous Every 8 hours 12/21/17 2035     12/21/17 1845  piperacillin-tazobactam (ZOSYN) IVPB 3.375 g     3.375 g 100 mL/hr over 30 Minutes Intravenous  Once 12/21/17 1841 12/21/17 1950       Assessment/Plan Ruptured appendicitis with localized abscess POD #4 -laparoscopic appendectomy mobilize. PT consult Advance to soft diet Needs antibiotics for 10 days-end 01/02/2018, but if able to DC tomorrow can complete at home Pathology benign, acute appendicitis -follow up with [facility] in 2 weeks. -plan to DC JP drain tomorrow prior to DC home even if still with higher output, but if looks benign  Nausea and vomiting.  Resolved.  Ileus slowly resolving.  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

## 2017-12-28 NOTE — Progress Notes (Signed)
Physical Therapy Treatment Patient Details Name: [name] MRN: [id number] DOB: [birth year] Today's Date: 12/28/2017    History of Present Illness y.o. male with a past medical history significant for PAF on eliquis, chronic systolic CHF EF 63%. ICD implant, COPD, Recurrent VT s/p ablation 2018, seizure disorder, CKD, and hypertension and admitted for Acute Appendicitis with perforation s/p laparoscopic appendectomy 8/5    PT Comments    Pt ambulated in hallway improved distance of 500 feet!  Pt anticipates d/c home tomorrow.   Follow Up Recommendations  Home health PT     Equipment Recommendations  None recommended by PT    Recommendations for Other Services       Precautions / Restrictions Precautions Precautions: Fall Precaution Comments: L drain    Mobility  Bed Mobility Overal bed mobility: Needs Assistance Bed Mobility: Supine to Sit;Sit to Supine     Supine to sit: Supervision Sit to supine: Supervision   General bed mobility comments: increased time and effort  Transfers Overall transfer level: Needs assistance Equipment used: None Transfers: Sit to/from Stand Sit to Stand: Min guard         General transfer comment: min/guard for safety  Ambulation/Gait Ambulation/Gait assistance: Min guard Gait Distance (Feet): 500 Feet Assistive device: Straight cane Gait Pattern/deviations: Step-through pattern;Decreased stride length     General Gait Details: good pace and steady with his walking stick   Stairs             Wheelchair Mobility    Modified Rankin (Stroke Patients Only)       Balance                                            Cognition Arousal/Alertness: Awake/alert Behavior During Therapy: WFL for tasks assessed/performed Overall Cognitive Status: Within Functional Limits for tasks assessed                                        Exercises      General Comments        Pertinent  Vitals/Pain Pain Assessment: No/denies pain    Home Living                      Prior Function            PT Goals (current goals can now be found in the care plan section) Progress towards PT goals: Progressing toward goals    Frequency    Min 3X/week      PT Plan Current plan remains appropriate    Co-evaluation              AM-PAC PT "6 Clicks" Daily Activity  Outcome Measure  Difficulty turning over in bed (including adjusting bedclothes, sheets and blankets)?: None Difficulty moving from lying on back to sitting on the side of the bed? : A Little Difficulty sitting down on and standing up from a chair with arms (e.g., wheelchair, bedside commode, etc,.)?: A Little Help needed moving to and from a bed to chair (including a wheelchair)?: A Little Help needed walking in hospital room?: A Little Help needed climbing 3-5 steps with a railing? : A Little 6 Click Score: 19    End of Session   Activity Tolerance: Patient tolerated treatment well  Patient left: with family/visitor present;with call bell/phone within reach;in bed   PT Visit Diagnosis: Difficulty in walking, not elsewhere classified (R26.2)     Time: 5072-2575 PT Time Calculation (min) (ACUTE ONLY): 14 min  Charges:  $Gait Training: 8-22 mins                    Carmelia Bake, PT, DPT 12/28/2017 Pager: [phone]  York Ram E 12/28/2017, 3:47 PM

## 2017-12-29 MED ORDER — POLYETHYLENE GLYCOL 3350 17 G PO PACK: 17.0000 g | PACK | Freq: Every day | ORAL | 0 refills | Status: DC

## 2017-12-29 MED ORDER — ACETAMINOPHEN 500 MG PO TABS: 1000.0000 mg | ORAL_TABLET | Freq: Four times a day (QID) | ORAL | Status: DC | PRN

## 2017-12-29 MED ORDER — FAMOTIDINE 20 MG PO TABS: 20.0000 mg | ORAL_TABLET | Freq: Every day | ORAL | Status: DC

## 2017-12-29 MED ORDER — GABAPENTIN 300 MG PO CAPS: 300.0000 mg | ORAL_CAPSULE | Freq: Every evening | ORAL | Status: DC | PRN

## 2017-12-29 NOTE — Care Management Note (Addendum)
Case Management Note  Patient Details  Name: Kaylor Simenson MRN: [id number] Date of Birth: [birth year]  Subjective/Objective:  S/p lap appendectomy                   Action/Plan: NCM spoke to pt and offered choice for [facility]. Pt and spouse declines HH at this time. He is going to outpt PT on [facility]. He has RW, cane, lift chair, CPAP and bedside commode.    Expected Discharge Date:  12/29/17               Expected Discharge Plan:  Acute to Acute Transfer  In-House Referral:     Discharge planning Services  CM Consult  Post Acute Care Choice:  Home Health Choice offered to:  Spouse, Patient  DME Arranged:  N/A DME Agency:  NA  HH Arranged:  Patient Refused [place] Agency:  NA  Status of Service:  Completed, signed off  If discussed at [place] of Stay Meetings, dates discussed:    Additional Comments:  Erenest Rasher, RN 12/29/2017, 1:46 PM

## 2017-12-29 NOTE — Discharge Summary (Signed)
Physician Discharge Summary  [name]  XNT:[id number]  DOB: December 18, 1945  DOA: 12/21/2017 PCP: Dorothyann Peng, NP  Admit date: 12/21/2017 Discharge date: 12/29/2017  Admitted From: Home  Disposition: Home   Recommendations for Outpatient Follow-up:  1. Follow up with PCP in 1-2 weeks 2. Follow up with surgical team on 01/10/18 @ 2:00 Pm  3. Please obtain BMP/CBC in one week to monitor Hgb and renal function   Home Health: PT/OT   Discharge Condition: Stable  CODE STATUS: Full Code  Diet recommendation: Heart Healthy    Brief/Interim Summary: For full details see H&P/Progress note, but in brief, [name] is a  72 year old male with past medical history significant for PAF on Eliquis, chronic systolic CHF, ICD, COPD, seizure disorder, CKD, hypertension Recurrent V. tach status post ablation. Patient presented to the emergency department on 8/2 with complaining of nausea, vomiting and diffuse abdominal pain. Upon ED evaluation CT of the abdomen showed perforated appendix with elevated WBC. Patient was placed on IV antibiotics and general surgery was consulted which initially recommended conservative management given comorbidities. Patient was admitted with working diagnosis of acute appendicitis.  Subjective: Patient seen and examined, have no complaints today, denies abdominal pain, N/V and weakness. Passing flatus, having BM's and tolerating diet well. Remains afebrile.   Discharge Diagnoses/Hospital Course:  Principal Problem:   Acute appendicitis with perforation and localized peritonitis, without abscess Active Problems:   Obstructive sleep apnea   Essential hypertension, benign   Chronic systolic CHF (congestive heart failure) ([name])   Atrial fibrillation ([name])   CKD (chronic kidney disease), stage III ([name])   Pacemaker   Seizure disorder ([name])   COPD (chronic obstructive pulmonary disease) ([name])   Ventricular tachycardia ([name])   DCM (dilated cardiomyopathy)  ([facility])   Coronary artery disease involving native coronary artery of native heart without angina pectoris   Appendicitis with perforation  Acute perforated appendicitis Initially attempted to treat conservatively due to comorbidities, however patient worsened and now is Status post lap appendectomy 8/5. JP drain was removed 8/10.  Patient was treated with IV Zosyn for 8 days, will need total 10 days of antibiotic therapy. Will discharge on Augmentin for 2 more days. He developed ileus post op, which has clinically resolved with ducolax and reglan. Will continue ducolax for now. Continue soft diet for few days the advance as tolerated. Follow up with surgery in 2 weeks.   Chronic systolic CHF A. fib on Eliquis/recurrent VT status post ablation 2018 CAD and hypertension. Remains stable  Prior to surgery cardiology was consulted for preoperative clearance. Rhythm has remained stable and well controlled during hospital stay. Patient has been placed back on his home medications. Eliquis restarted on 8/6. No signs of fluid overload and heart rate remains in NSR.  CKD stage III CR at baseline   Chronic COPD Appears to be stable, no wheezing, continue Symbicort and albuterol  All other chronic medical condition were stable during the hospitalization.  Patient was seen by physical therapy, recommending home health PT  On the day of the discharge the patient's vitals were stable, and no other acute medical condition were reported by patient. the patient was felt safe to be discharge to home   Discharge Instructions  You were cared for by a hospitalist during your hospital stay. If you have any questions about your discharge medications or the care you received while you were in the hospital after you are discharged, you can call the unit and asked to speak with the  hospitalist on call if the hospitalist that took care of you is not available. Once you are discharged, your primary care physician  will handle any further medical issues. Please note that NO REFILLS for any discharge medications will be authorized once you are discharged, as it is imperative that you return to your primary care physician (or establish a relationship with a primary care physician if you do not have one) for your aftercare needs so that they can reassess your need for medications and monitor your lab values.  Discharge Instructions    (HEART FAILURE PATIENTS) Call MD:  Anytime you have any of the following symptoms: 1) 3 pound weight gain in 24 hours or 5 pounds in 1 week 2) shortness of breath, with or without a dry hacking cough 3) swelling in the hands, feet or stomach 4) if you have to sleep on extra pillows at night in order to breathe.   Complete by:  As directed    Call MD for:  difficulty breathing, headache or visual disturbances   Complete by:  As directed    Call MD for:  extreme fatigue   Complete by:  As directed    Call MD for:  hives   Complete by:  As directed    Call MD for:  persistant dizziness or light-headedness   Complete by:  As directed    Call MD for:  persistant nausea and vomiting   Complete by:  As directed    Call MD for:  redness, tenderness, or signs of infection (pain, swelling, redness, odor or green/yellow discharge around incision site)   Complete by:  As directed    Call MD for:  severe uncontrolled pain   Complete by:  As directed    Call MD for:  temperature >100.4   Complete by:  As directed    Diet - low sodium heart healthy   Complete by:  As directed    Increase activity slowly   Complete by:  As directed    Increase activity slowly   Complete by:  As directed      Allergies as of 12/29/2017      Reactions   Aricept [donepezil Hcl] Other (See Comments)   Worsens renal function   Codeine Hives   Nexium [esomeprazole] Other (See Comments)   Severely worsens renal function   Omeprazole Hives   Pantoprazole Sodium Other (See Comments)   Renal failure    Tramadol Nausea And Vomiting      Medication List    STOP taking these medications   clotrimazole-betamethasone cream Commonly known as:  LOTRISONE   IMODIUM A-D PO   mirabegron ER 25 MG Tb24 tablet Commonly known as:  MYRBETRIQ     TAKE these medications   acetaminophen 500 MG tablet Commonly known as:  TYLENOL Take 2 tablets (1,000 mg total) by mouth every 6 (six) hours as needed for fever.   albuterol 108 (90 Base) MCG/ACT inhaler Commonly known as:  PROVENTIL HFA;VENTOLIN HFA Inhale 1-2 puffs into the lungs every 4 (four) hours as needed for wheezing or shortness of breath (or cough).   amoxicillin-clavulanate 875-125 MG tablet Commonly known as:  AUGMENTIN Take 1 tablet by mouth every 12 (twelve) hours for 5 days.   BIDIL 20-37.5 MG tablet Generic drug:  isosorbide-hydrALAZINE TAKE TWO TABLETS BY MOUTH THREE TIMES A DAY.   bismuth subsalicylate 008 QP/61PJ suspension Commonly known as:  PEPTO BISMOL Take 30 mLs by mouth every 4 (four) hours as needed  for diarrhea or loose stools.   budesonide-formoterol 160-4.5 MCG/ACT inhaler Commonly known as:  SYMBICORT Inhale 2 puffs into the lungs every 12 (twelve) hours.   ELIQUIS 5 MG Tabs tablet Generic drug:  apixaban TAKE ONE TABLET BY MOUTH TWICE DAILY What changed:    how much to take  how to take this  when to take this   ENTRESTO 97-103 MG Generic drug:  sacubitril-valsartan TAKE 1 TABLET BY MOUTH 2 (TWO) TIMES DAILY.   famotidine 20 MG tablet Commonly known as:  PEPCID Take 1 tablet (20 mg total) by mouth daily. Start taking on:  12/30/2017   fenofibrate 160 MG tablet Take 1 tablet (160 mg total) by mouth every evening.   finasteride 5 MG tablet Commonly known as:  PROSCAR TAKE ONE TABLET BY MOUTH ONCE DAILY What changed:    how much to take  how to take this  when to take this   furosemide 20 MG tablet Commonly known as:  LASIX TAKE ONE TABLET BY MOUTH THREE TIMES A WEEK (  [place]) What changed:  See the new instructions.   gabapentin 300 MG capsule Commonly known as:  NEURONTIN Take 1 capsule (300 mg total) by mouth at bedtime as needed.   HYDROcodone-acetaminophen 5-325 MG tablet Commonly known as:  NORCO/VICODIN Take 1 tablet by mouth every 6 (six) hours as needed for moderate pain (if tylenol is ineffective).   lacosamide 50 MG Tabs tablet Commonly known as:  VIMPAT Take 1.5 tablets twice a day What changed:    how much to take  how to take this  when to take this   levocetirizine 5 MG tablet Commonly known as:  XYZAL Take 1 tablet (5 mg total) by mouth every evening.   metoprolol succinate 50 MG 24 hr tablet Commonly known as:  TOPROL-XL Take 50 mg by mouth every evening. Take 25 mg in the AM and 50 mg in the PM   metoprolol succinate 25 MG 24 hr tablet Commonly known as:  TOPROL-XL Take 25 mg by mouth every morning.   mexiletine 150 MG capsule Commonly known as:  MEXITIL Take 1 capsule (150 mg total) by mouth 2 (two) times daily.   nitroGLYCERIN 0.4 MG SL tablet Commonly known as:  NITROSTAT Place 0.4 mg under the tongue every 5 (five) minutes x 3 doses as needed for chest pain.   polyethylene glycol packet Commonly known as:  MIRALAX / GLYCOLAX Take 17 g by mouth daily. Start taking on:  12/30/2017   ranolazine 500 MG 12 hr tablet Commonly known as:  RANEXA Take 1 tablet (500 mg total) daily by mouth. What changed:  when to take this   rosuvastatin 40 MG tablet Commonly known as:  CRESTOR TAKE ONE TABLET BY MOUTH EVERY DAY IN THE EVENING   sotalol 80 MG tablet Commonly known as:  BETAPACE Take 1 tablet (80 mg total) by mouth daily. What changed:  when to take this   tamsulosin 0.4 MG Caps capsule Commonly known as:  FLOMAX Take 0.4 mg by mouth at bedtime.      Follow-up Information    [facility] Follow up on 01/10/2018.   Specialty:  General Surgery Why:  arrive by 1:30pm, for  a 2:00pm appointment time for paperwork and check in Contact information: [address] [place] [address] [phone]        Dorothyann Peng, NP. Schedule an appointment as soon as possible for a visit in 1  week(s).   Specialty:  Family Medicine Why:  Hospital follow up  Contact information: [address]  [place] [address] [phone]          Allergies  Allergen Reactions  . Aricept [Donepezil Hcl] Other (See Comments)    Worsens renal function  . Codeine Hives  . Nexium [Esomeprazole] Other (See Comments)    Severely worsens renal function  . Omeprazole Hives  . Pantoprazole Sodium Other (See Comments)    Renal failure  . Tramadol Nausea And Vomiting    Consultations: General surgery   Procedures/Studies: Ct Abdomen Pelvis Wo Contrast  Result Date: 12/24/2017 CLINICAL DATA:  Right lower quadrant abdominal pain. Appendiceal abscess. EXAM: CT ABDOMEN AND PELVIS WITHOUT CONTRAST TECHNIQUE: Multidetector CT imaging of the abdomen and pelvis was performed following the standard protocol without IV contrast. COMPARISON:  12/21/2017 FINDINGS: Lower chest: Increased dependent atelectasis in both lower lobes. Mild lingular atelectasis or scarring. Dual lead pacer. Small anterior pericardial effusion, increased from previous. Hepatobiliary: Stable hypodense hepatic lesions. Trace perihepatic ascites, increased from previous. Dependent density in the gallbladder, likely sludge. Pancreas: Unremarkable Spleen: Trace perisplenic ascites. Adrenals/Urinary Tract: Adrenal glands normal. Scarring in the right kidney upper pole, versus less likely angiomyolipoma. Right mid kidney fluid density lesion compatible with cyst. Potential 1 mm right kidney upper pole nonobstructive renal calculus on image 68/4. Stomach/Bowel: There is appendiceal wall thickening up to 1.2 cm, with appendicolith along the appendiceal tip which is indistinct and more distended than the rest  of the appendix. Localized perforation is a distinct possibility, but no extraluminal gas is identified. There is an increased amount of fluid density tracking along tissue planes in the vicinity of the appendix along with mesenteric edema. There is wall thickening in distal small bowel loops in the vicinity of the appendix. Scattered air-fluid levels in nondilated small bowel loops. Vascular/Lymphatic: Aortoiliac atherosclerotic vascular disease. Small likely reactive pelvic and retroperitoneal lymph nodes. Reproductive: Unremarkable Other: Increased amount of free pelvic fluid. A small amount of pelvic fluid extends into the direct left inguinal hernia. This hernia also contains adipose tissue. Musculoskeletal: Congenitally short pedicles in the lumbar spine with mild spondylosis and degenerative disc disease resulting in impingement at L3-4 and borderline impingement at L4-5. Small umbilical hernia contains adipose tissue. In addition to the direct left inguinal hernia there is a small indirect right inguinal hernia containing adipose tissues. IMPRESSION: 1. Acute appendicitis with the mid appendix about 1.5 cm in diameter, and the distal appendix containing appendicolith slightly more dilated but with indistinct margins. No extraluminal gas. There is an increased amount of ascites, primarily pelvic but with a small amount of perisplenic and perihepatic ascites. No well-defined drainable abscess on today's noncontrast exam. 2. Mildly increased dependent atelectasis in both lower lobes. Small anterior pericardial effusion, increased from prior. 3. Stable hypodense hepatic lesions, likely cysts. 4. Potential 1 mm right kidney upper pole nonobstructive renal calculus. Scarring in the right kidney upper pole as well. 5.  Aortic Atherosclerosis (ICD10-I70.0). 6. Mild impingement at L3-4. 7. Small direct left in indirect right inguinal hernias containing adipose tissue and a small amount of fluid. Electronically Signed    By: Van Clines M.D.   On: 12/24/2017 09:14   Ct Abdomen Pelvis W Contrast  Result Date: 12/21/2017 CLINICAL DATA:  RIGHT lower quadrant pain. Seen 2 days ago given Zofran. EXAM: CT ABDOMEN AND PELVIS WITH CONTRAST TECHNIQUE: Multidetector CT imaging of the abdomen and pelvis was performed using the standard protocol following bolus  administration of intravenous contrast. CONTRAST:  114mL ISOVUE-300 IOPAMIDOL (ISOVUE-300) INJECTION 61% COMPARISON:  None. FINDINGS: Lower chest: Emphysematous change lung bases. Cardiomegaly with pacemaker. Hepatobiliary: Multiple simple hepatic cysts. No gallstones or biliary ductal dilatation. No solid hepatic mass. Pancreas: Normal. Spleen: Normal. Adrenals/Urinary Tract: Renal cystic disease. No hydronephrosis. Normal adrenal glands. Stomach/Bowel: Stomach is normal. Mild small bowel ileus. There is acute appendicitis with an enlarged thick-walled appendix, and multiple appendicoliths. There is a localized perforation in the RIGHT lower quadrant with free fluid inferolaterally. No pneumoperitoneum. Vascular/Lymphatic: Aortic atherosclerosis. Reproductive: Prostate is unremarkable. Other: No abdominal wall hernia or abnormality. No abdominopelvic ascites. Musculoskeletal: No acute or significant osseous findings. IMPRESSION: Acute appendicitis, multiple appendicoliths, and localized perforation with free fluid; no pneumoperitoneum. Localized ileus. These results were called by telephone at the time of interpretation on 12/21/2017 at 6:40 pm to Dr. Nanda Quinton , who verbally acknowledged these results. Electronically Signed   By: Staci Righter M.D.   On: 12/21/2017 18:42    Discharge Exam: Vitals:   12/29/17 0523 12/29/17 0945  BP: 116/74   Pulse: 62 67  Resp: 12   Temp: 97.7 F (36.5 C)   SpO2: 99%    Vitals:   12/28/17 1951 12/28/17 2134 12/29/17 0523 12/29/17 0945  BP:  133/80 116/74   Pulse:  64 62 67  Resp:  16 12   Temp:  98.2 F (36.8 C) 97.7 F  (36.5 C)   TempSrc:  Oral Oral   SpO2: 95% 94% 99%   Weight:   81.6 kg   Height:        General: Pt is alert, awake, not in acute distress Cardiovascular: RRR, S1/S2 + Respiratory: CTA bilaterally Abdominal: Soft, NT, ND, dressing intact, clean and dry  Extremities: no edema   The results of significant diagnostics from this hospitalization (including imaging, microbiology, ancillary and laboratory) are listed below for reference.     Microbiology: Recent Results (from the past 240 hour(s))  MRSA PCR Screening     Status: None   Collection Time: 12/24/17 11:12 AM  Result Value Ref Range Status   MRSA by PCR NEGATIVE NEGATIVE Final    Comment:        The GeneXpert MRSA Assay (FDA approved for NASAL specimens only), is one component of a comprehensive MRSA colonization surveillance program. It is not intended to diagnose MRSA infection nor to guide or monitor treatment for MRSA infections. Performed at [facility], [place] [address], [place], [place] [address]      Labs: BNP (last 3 results) No results for input(s): BNP in the last 8760 hours. Basic Metabolic Panel: Recent Labs  Lab 12/23/17 1838 12/24/17 0709 12/25/17 0448 12/26/17 0443 12/27/17 0430 12/28/17 0441  NA 133* 135 139 139 139 138  K 3.9 4.3 4.1 4.0 3.9 4.1  CL 97* 98 101 98 99 98  CO2 26 29 27 29 29 30   GLUCOSE 140* 126* 155* 197* 195* 239*  BUN 11 11 21  26* 27* 29*  CREATININE 1.60* 1.38* 1.80* 1.63* 1.52* 1.67*  CALCIUM 8.4* 8.8* 8.7* 9.2 8.8* 9.1  MG 1.7  --   --  2.4 2.6* 2.4   Liver Function Tests: Recent Labs  Lab 12/26/17 0443  AST 18  ALT 16  ALKPHOS 30*  BILITOT 0.8  PROT 6.9  ALBUMIN 3.0*   No results for input(s): LIPASE, AMYLASE in the last 168 hours. No results for input(s): AMMONIA in the last 168 hours. CBC: Recent Labs  Lab 12/23/17 0442 12/24/17 0709 12/25/17 0448 12/26/17 0443 12/27/17 0430  WBC 16.2* 16.2* 13.4* 12.8* 11.3*  NEUTROABS  12.4*  --   --  10.1* 8.4*  HGB 14.6 15.5 14.2 14.9 14.1  HCT 42.3 44.9 41.2 42.2 40.8  MCV 90.4 91.6 90.2 88.5 89.1  PLT 210 212 305 351 417*   Cardiac Enzymes: No results for input(s): CKTOTAL, CKMB, CKMBINDEX, TROPONINI in the last 168 hours. BNP: Invalid input(s): POCBNP CBG: No results for input(s): GLUCAP in the last 168 hours. D-Dimer No results for input(s): DDIMER in the last 72 hours. Hgb A1c No results for input(s): HGBA1C in the last 72 hours. Lipid Profile No results for input(s): CHOL, HDL, LDLCALC, TRIG, CHOLHDL, LDLDIRECT in the last 72 hours. Thyroid function studies No results for input(s): TSH, T4TOTAL, T3FREE, THYROIDAB in the last 72 hours.  Invalid input(s): FREET3 Anemia work up No results for input(s): VITAMINB12, FOLATE, FERRITIN, TIBC, IRON, RETICCTPCT in the last 72 hours. Urinalysis    Component Value Date/Time   COLORURINE YELLOW 12/21/2017 1918   APPEARANCEUR CLEAR 12/21/2017 1918   APPEARANCEUR Clear 09/06/2015 1053   LABSPEC 1.041 (H) 12/21/2017 1918   PHURINE 5.0 12/21/2017 1918   GLUCOSEU NEGATIVE 12/21/2017 1918   HGBUR NEGATIVE 12/21/2017 1918   BILIRUBINUR NEGATIVE 12/21/2017 1918   BILIRUBINUR n 01/03/2016 1014   BILIRUBINUR Negative 09/06/2015 1053   KETONESUR NEGATIVE 12/21/2017 1918   PROTEINUR NEGATIVE 12/21/2017 1918   UROBILINOGEN 0.2 01/03/2016 1014   UROBILINOGEN 2.0 (H) 02/24/2011 2051   NITRITE NEGATIVE 12/21/2017 1918   LEUKOCYTESUR NEGATIVE 12/21/2017 1918   LEUKOCYTESUR Negative 09/06/2015 1053   Sepsis Labs Invalid input(s): PROCALCITONIN,  WBC,  LACTICIDVEN Microbiology Recent Results (from the past 240 hour(s))  MRSA PCR Screening     Status: None   Collection Time: 12/24/17 11:12 AM  Result Value Ref Range Status   MRSA by PCR NEGATIVE NEGATIVE Final    Comment:        The GeneXpert MRSA Assay (FDA approved for NASAL specimens only), is one component of a comprehensive MRSA colonization surveillance  program. It is not intended to diagnose MRSA infection nor to guide or monitor treatment for MRSA infections. Performed at [facility], [place] [address], [place], [place] [address]     Time coordinating discharge: 32 minutes  SIGNED:  Chipper Oman, MD  [facility] 12/29/2017, 12:21 PM  Pager please text page via  [email or web address]  Note - This record has been created using [employer]. Chart creation errors have been sought, but may not always have been located. Such creation errors do not reflect on the standard of medical care.

## 2017-12-29 NOTE — Progress Notes (Signed)
Reviewed discharge information with patient and caregiver. Answered all questions. Patient/caregiver able to teach back medications and reasons to contact MD/911. Patient verbalizes importance of PCP follow up appointment.  Shariah Assad M. Quintara Bost, RN  

## 2017-12-29 NOTE — Progress Notes (Signed)
[name], male   DOB: 1945-10-14, 72 y.o.   MRN: [id number] 5 Days Post-Op   Subjective: No complaints this morning.  Tolerating diet.  Denies pain or nausea.  Objective: Vital signs in last 24 hours: Temp:  [97.7 F (36.5 C)-98.2 F (36.8 C)] 97.7 F (36.5 C) (08/10 0523) Pulse Rate:  [62-66] 62 (08/10 0523) Resp:  [12-16] 12 (08/10 0523) BP: (111-133)/(59-80) 116/74 (08/10 0523) SpO2:  [94 %-99 %] 99 % (08/10 0523) Weight:  [81.6 kg] 81.6 kg (08/10 0523) Last BM Date: 12/27/17  Intake/Output from previous day: 08/09 0701 - 08/10 0700 In: 1447.5 [P.O.:1260; I.V.:187.5] Out: 155 [Drains:155] Intake/Output this shift: No intake/output data recorded.  General appearance: alert, cooperative and no distress GI: normal findings: soft, non-tender and Nondistended Incision/Wound: Clean and dry.  JP drainage serosanguineous and minimal  Lab Results:  Recent Labs    12/27/17 0430  WBC 11.3*  HGB 14.1  HCT 40.8  PLT 417*   BMET Recent Labs    12/27/17 0430 12/28/17 0441  NA 139 138  K 3.9 4.1  CL 99 98  CO2 29 30  GLUCOSE 195* 239*  BUN 27* 29*  CREATININE 1.52* 1.67*  CALCIUM 8.8* 9.1     Studies/Results: No results found.  Anti-infectives: Anti-infectives (From admission, onward)   Start     Dose/Rate Route Frequency Ordered Stop   12/28/17 1000  amoxicillin-clavulanate (AUGMENTIN) 875-125 MG per tablet 1 tablet     1 tablet Oral Every 12 hours 12/28/17 0807 01/03/18 0959   12/28/17 0000  amoxicillin-clavulanate (AUGMENTIN) 875-125 MG tablet  Status:  Discontinued     1 tablet Oral Every 12 hours 12/28/17 0809 12/28/17    12/28/17 0000  amoxicillin-clavulanate (AUGMENTIN) 875-125 MG tablet     1 tablet Oral Every 12 hours 12/28/17 0820 01/02/18 2359   12/22/17 0400  piperacillin-tazobactam (ZOSYN) IVPB 3.375 g  Status:  Discontinued     3.375 g 12.5 mL/hr over 240 Minutes Intravenous Every 8 hours 12/21/17 2035 12/28/17 0807   12/21/17  1845  piperacillin-tazobactam (ZOSYN) IVPB 3.375 g     3.375 g 100 mL/hr over 30 Minutes Intravenous  Once 12/21/17 1841 12/21/17 1950      Assessment/Plan: s/p Procedure(s): APPENDECTOMY LAPAROSCOPIC Doing well postoperatively without apparent complication.  Remove drain today and okay for discharge.   LOS: 8 days    Edward Jolly 12/29/2017

## 2017-12-31 ENCOUNTER — Telehealth: Payer: Self-pay | Admitting: Family Medicine

## 2017-12-31 NOTE — Telephone Encounter (Signed)
Unable to reach patient at time of TCM Call. Left message for patient to return call when available.  

## 2018-01-01 NOTE — Telephone Encounter (Signed)
Transition Care Management Follow-up Telephone Call  [name]  IWP:[id number]  DOB: 08-Oct-1945  DOA: 12/21/2017 PCP: Dorothyann Peng, NP  Admit date: 12/21/2017 Discharge date: 12/29/2017  Admitted From: Home  Disposition: Home   Recommendations for Outpatient Follow-up:  1. Follow up with PCP in 1-2 weeks 2. Follow up with surgical team on 01/10/18 @ 2:00 Pm  3. Please obtain BMP/CBC in one week to monitor Hgb and renal function   Home Health: PT/OT   Discharge Condition: Stable  CODE STATUS: Full Code  Diet recommendation: Heart Healthy    Brief/Interim Summary: For full details see H&P/Progress note, but in brief, [name] is a 72 year old male with past medical history significant for PAF on Eliquis, chronic systolic CHF, ICD, COPD, seizure disorder, CKD, hypertension Recurrent V. tach status post ablation. Patient presented to the emergency department on 8/2 with complaining of nausea, vomiting and diffuse abdominal pain. Upon ED evaluation CT of the abdomen showed perforated appendix with elevated WBC. Patient was placed on IV antibiotics and general surgery was consulted which initially recommended conservative management given comorbidities. Patient was admitted with working diagnosis of acute appendicitis.    How have you been since you were released from the hospital? "a little sore but better"   Do you understand why you were in the hospital? yes   Do you understand the discharge instructions? yes   Where were you discharged to? Home   Items Reviewed:  Medications reviewed: yes  Allergies reviewed: yes  Dietary changes reviewed: yes  Referrals reviewed: yes   Functional Questionnaire:   Activities of Daily Living (ADLs):   He states they are independent in the following: ambulation, bathing and hygiene, feeding, continence, grooming, toileting and dressing States they require assistance with the following: none   Any  transportation issues/concerns?: no   Any patient concerns? no   Confirmed importance and date/time of follow-up visits scheduled yes  Provider Appointment booked with Dr. Elease Hashimoto on 01/04/2018 Friday at 1:15 pm, Tommi Rumps is not available  Confirmed with patient if condition begins to worsen call PCP or go to the ER.  Patient was given the office number and encouraged to call back with question or concerns.  : yes

## 2018-01-03 ENCOUNTER — Ambulatory Visit (INDEPENDENT_AMBULATORY_CARE_PROVIDER_SITE_OTHER): Payer: Medicare Other | Admitting: Family Medicine

## 2018-01-03 ENCOUNTER — Encounter: Payer: Self-pay | Admitting: Family Medicine

## 2018-01-03 VITALS — BP 122/64 | HR 74 | Temp 98.0°F | Wt 197.6 lb

## 2018-01-03 DIAGNOSIS — M79671 Pain in right foot: Secondary | ICD-10-CM | POA: Diagnosis not present

## 2018-01-03 DIAGNOSIS — M79672 Pain in left foot: Secondary | ICD-10-CM | POA: Diagnosis not present

## 2018-01-03 NOTE — Patient Instructions (Signed)
Trial of extra strength tylenol for foot pain. Keep feet elevated when resting. OK to ice feet to help with any inflammation.
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
## 2018-01-04 ENCOUNTER — Ambulatory Visit (INDEPENDENT_AMBULATORY_CARE_PROVIDER_SITE_OTHER): Payer: Medicare Other | Admitting: Family Medicine

## 2018-01-04 VITALS — BP 118/64 | HR 66 | Temp 98.1°F | Wt 182.6 lb

## 2018-01-04 DIAGNOSIS — N183 Chronic kidney disease, stage 3 unspecified: Secondary | ICD-10-CM

## 2018-01-04 DIAGNOSIS — K3532 Acute appendicitis with perforation and localized peritonitis, without abscess: Secondary | ICD-10-CM | POA: Diagnosis not present

## 2018-01-04 DIAGNOSIS — I5022 Chronic systolic (congestive) heart failure: Secondary | ICD-10-CM

## 2018-01-04 DIAGNOSIS — E118 Type 2 diabetes mellitus with unspecified complications: Secondary | ICD-10-CM | POA: Diagnosis not present

## 2018-01-04 DIAGNOSIS — E1165 Type 2 diabetes mellitus with hyperglycemia: Secondary | ICD-10-CM

## 2018-01-04 LAB — POCT GLYCOSYLATED HEMOGLOBIN (HGB A1C): HbA1c POC (<> result, manual entry): 7.8 % (ref 4.0–5.6)

## 2018-01-04 NOTE — Patient Instructions (Signed)
Set up 2 week follow up with [facility].  Bring in log of blood sugars at that visit.  Consider checking fasting sugars and record.
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
## 2018-01-05 DIAGNOSIS — E1165 Type 2 diabetes mellitus with hyperglycemia: Secondary | ICD-10-CM | POA: Insufficient documentation

## 2018-01-05 DIAGNOSIS — E1122 Type 2 diabetes mellitus with diabetic chronic kidney disease: Secondary | ICD-10-CM | POA: Insufficient documentation

## 2018-01-07 ENCOUNTER — Telehealth: Payer: Self-pay | Admitting: Adult Health

## 2018-01-07 DIAGNOSIS — M79673 Pain in unspecified foot: Secondary | ICD-10-CM

## 2018-01-07 NOTE — Telephone Encounter (Signed)
We did not discuss staying on hydrocodone for his foot pain.  At visit, his pain had resolved.  If foot pain persists, I would suggest follow up with his primary.  Other option would be to go ahead with podiatry referral.

## 2018-01-07 NOTE — Telephone Encounter (Signed)
Copied from [place] [id number]. Topic: General - Other >> Jan 07, 2018  7:54 AM Lennox Solders wrote: Reason for CRM: Pt saw dr Elease Hashimoto on Friday 01-04-18. The foot pain has returned on Saturday . Pt wife stated dr Elease Hashimoto said he would prescribe some hydrocodone. [facility]. Pt wife has been taking his sugar Friday afternoon 127 Saturday morning 143 before breakfast and sun 01-06-18 morning 143 before breakfast

## 2018-01-07 NOTE — Telephone Encounter (Signed)
Patient's wife calling and states that they would like a podiatry referral to [facility]. Please advise. CB#: [phone]

## 2018-01-07 NOTE — Telephone Encounter (Signed)
Dr. Elease Hashimoto, please advise of the hydrocodone for the pt for the foot pain that has returned on satruday  Thank you.

## 2018-01-08 NOTE — Telephone Encounter (Signed)
The referral has been placed for the pt to get set up with podiatry.  Pt does have a pending follow up appt with Tommi Rumps on 8/30.  Nothing further is needed.

## 2018-01-09 ENCOUNTER — Other Ambulatory Visit: Payer: Self-pay | Admitting: Adult Health

## 2018-01-09 ENCOUNTER — Ambulatory Visit (INDEPENDENT_AMBULATORY_CARE_PROVIDER_SITE_OTHER): Payer: Medicare Other | Admitting: *Deleted

## 2018-01-09 ENCOUNTER — Other Ambulatory Visit: Payer: Self-pay | Admitting: Internal Medicine

## 2018-01-09 DIAGNOSIS — I472 Ventricular tachycardia, unspecified: Secondary | ICD-10-CM

## 2018-01-09 DIAGNOSIS — I255 Ischemic cardiomyopathy: Secondary | ICD-10-CM

## 2018-01-09 NOTE — Telephone Encounter (Signed)
2. VT Continues to have episodes of VT He did not tolerate mexiletine

## 2018-01-09 NOTE — Telephone Encounter (Signed)
Copied from [place] [id number]. Topic: Quick Communication - Rx Refill/Question >> Jan 09, 2018 12:52 PM Bea Graff, NT wrote: Medication: Accu Check Guide Lancet and needles  Has the patient contacted their pharmacy? Yes.   (Agent: If no, request that the patient contact the pharmacy for the refill.) (Agent: If yes, when and what did the pharmacy advise?)  Preferred Pharmacy (with phone number or street name): [facility], [place] [phone] (Phone) [phone] (Fax)    Agent: Please be advised that RX refills may take up to 3 business days. We ask that you follow-up with your pharmacy.

## 2018-01-09 NOTE — Telephone Encounter (Signed)
Pt called regarding his request for Accu Check Guide lancet and needles. These are not listed on his med list.  LOV  01/04/18 NOV 01/18/18 PCP Dorothyann Peng, [place]

## 2018-01-09 NOTE — Progress Notes (Signed)
Remote ICD transmission.   

## 2018-01-10 ENCOUNTER — Encounter: Payer: Self-pay | Admitting: Podiatry

## 2018-01-10 ENCOUNTER — Encounter

## 2018-01-10 ENCOUNTER — Ambulatory Visit (INDEPENDENT_AMBULATORY_CARE_PROVIDER_SITE_OTHER): Payer: Medicare Other | Admitting: Podiatry

## 2018-01-10 DIAGNOSIS — M7751 Other enthesopathy of right foot: Secondary | ICD-10-CM | POA: Diagnosis not present

## 2018-01-10 DIAGNOSIS — G629 Polyneuropathy, unspecified: Secondary | ICD-10-CM | POA: Diagnosis not present

## 2018-01-10 DIAGNOSIS — M7752 Other enthesopathy of left foot: Secondary | ICD-10-CM | POA: Diagnosis not present

## 2018-01-10 DIAGNOSIS — M779 Enthesopathy, unspecified: Secondary | ICD-10-CM

## 2018-01-10 MED ORDER — ACCU-CHEK FASTCLIX LANCETS MISC: 1 refills | Status: DC

## 2018-01-10 MED ORDER — TRIAMCINOLONE ACETONIDE 10 MG/ML IJ SUSP
10.0000 mg | Freq: Once | INTRAMUSCULAR | Status: AC
Start: 2018-01-10 — End: 2018-01-10
  Administered 2018-01-10: 10 mg

## 2018-01-10 MED ORDER — GLUCOSE BLOOD VI STRP: 1.0000 | ORAL_STRIP | Freq: Three times a day (TID) | 1 refills | Status: DC

## 2018-01-10 MED ORDER — HYDROCODONE-ACETAMINOPHEN 10-325 MG PO TABS: 1.0000 | ORAL_TABLET | Freq: Three times a day (TID) | ORAL | 0 refills | Status: DC | PRN

## 2018-01-10 NOTE — Telephone Encounter (Signed)
Ok to give 90 day supply. Check TID

## 2018-01-10 NOTE — Telephone Encounter (Signed)
Cory please advise if you are ok with the refills of the lancet and needles that the pt is requesting.  I did not see these listed on his medication list.  Thanks

## 2018-01-10 NOTE — Telephone Encounter (Signed)
Refill has been sent to the pharmacy for the pt.  Nothing further is needed.

## 2018-01-10 NOTE — Progress Notes (Signed)
Subjective:   [name], male   DOB: 72 y.o.   MRN: [id number]   HPI Patient presents stating that the patient was in the hospital for several weeks and got out 2 weeks ago and has had exquisite discomfort in both feet with walking and not sure what may have happened.  Patient also is having a lot of pain at night having trouble sleeping and was given tentative diagnosis of neuropathy   ROS      Objective:  Physical Exam  Neurovascular status unchanged with patient found to have a lot of discomfort around the second metatarsal phalangeal joint bilateral with inflammation fluid of the joint with relatively good circulation and diminished sharp dull vibratory bilateral     Assessment:  Appears to be more of an acute inflammatory reaction than anything else even though I cannot rule out this may not be related to neuropathy     Plan:  H&P in both conditions reviewed.  Today I went ahead and I injected the capsule of the second MPJ bilateral 3 mg Kenalog 5 mg Xylocaine and I wrote for Vicodin just to be taken at night.  I discussed neuropathy and patient is hopefully can start medicine to help that condition and is working with family physician

## 2018-01-10 NOTE — Telephone Encounter (Signed)
pts wife called back and stated that she used the last test strip and lancet this morning and is worried that these will not be refilled so she will have more to test tomorrow.  She has been keeping a log of BS.  Please advise. Thanks

## 2018-01-11 ENCOUNTER — Telehealth: Payer: Self-pay

## 2018-01-11 ENCOUNTER — Encounter: Payer: Self-pay | Admitting: Cardiology

## 2018-01-11 ENCOUNTER — Encounter: Payer: Self-pay | Admitting: Family Medicine

## 2018-01-11 ENCOUNTER — Ambulatory Visit: Payer: Self-pay | Admitting: Adult Health

## 2018-01-11 ENCOUNTER — Ambulatory Visit (INDEPENDENT_AMBULATORY_CARE_PROVIDER_SITE_OTHER): Payer: Medicare Other | Admitting: Family Medicine

## 2018-01-11 VITALS — BP 118/48 | HR 75 | Temp 98.0°F | Wt 181.9 lb

## 2018-01-11 DIAGNOSIS — E1165 Type 2 diabetes mellitus with hyperglycemia: Secondary | ICD-10-CM

## 2018-01-11 DIAGNOSIS — E118 Type 2 diabetes mellitus with unspecified complications: Secondary | ICD-10-CM | POA: Diagnosis not present

## 2018-01-11 LAB — CBC WITH DIFFERENTIAL/PLATELET
BASOS PCT: 0.6 % (ref 0.0–3.0)
Basophils Absolute: 0.1 10*3/uL (ref 0.0–0.1)
EOS PCT: 0.4 % (ref 0.0–5.0)
Eosinophils Absolute: 0 10*3/uL (ref 0.0–0.7)
HCT: 37.2 % — ABNORMAL LOW (ref 39.0–52.0)
HEMOGLOBIN: 12.6 g/dL — AB (ref 13.0–17.0)
LYMPHS ABS: 1.9 10*3/uL (ref 0.7–4.0)
Lymphocytes Relative: 20 % (ref 12.0–46.0)
MCHC: 33.9 g/dL (ref 30.0–36.0)
MCV: 91.5 fl (ref 78.0–100.0)
MONOS PCT: 7.2 % (ref 3.0–12.0)
Monocytes Absolute: 0.7 10*3/uL (ref 0.1–1.0)
NEUTROS PCT: 71.8 % (ref 43.0–77.0)
Neutro Abs: 6.7 10*3/uL (ref 1.4–7.7)
Platelets: 377 10*3/uL (ref 150.0–400.0)
RBC: 4.07 Mil/uL — ABNORMAL LOW (ref 4.22–5.81)
RDW: 13.8 % (ref 11.5–15.5)
WBC: 9.3 10*3/uL (ref 4.0–10.5)

## 2018-01-11 LAB — BASIC METABOLIC PANEL
BUN: 26 mg/dL — ABNORMAL HIGH (ref 6–23)
CALCIUM: 10.2 mg/dL (ref 8.4–10.5)
CO2: 32 mEq/L (ref 19–32)
CREATININE: 1.74 mg/dL — AB (ref 0.40–1.50)
Chloride: 102 mEq/L (ref 96–112)
GFR: 49.94 mL/min — ABNORMAL LOW (ref >60.00)
GLUCOSE: 164 mg/dL — AB (ref 70–99)
Potassium: 4.8 mEq/L (ref 3.5–5.1)
Sodium: 139 mEq/L (ref 135–145)

## 2018-01-11 LAB — GLUCOSE, POCT (MANUAL RESULT ENTRY): POC Glucose: 172 mg/dl — AB (ref 70–99)

## 2018-01-11 MED ORDER — INSULIN GLARGINE 100 UNIT/ML SOLOSTAR PEN: 10.0000 [IU] | PEN_INJECTOR | Freq: Every day | SUBCUTANEOUS | 5 refills | Status: DC

## 2018-01-11 NOTE — Progress Notes (Signed)
[name] DOB: [birth year] Encounter date: 01/11/2018  This is a 72 y.o. male who presents with Chief Complaint  Patient presents with  . Hyperglycemia    BG was 283 at 8:40am this morning, pt has not eaten, pt had black coffee, pt would like a referral for nutritionist    History of present illness: Foot pain has nearly resolved since seeing podiatry and getting steroid injections.  Phone call this morning from caregiver stating sugar was elevated. Patient did have milk shake and "indulged" yesterday. Diagnosis of diabetes is new for him. Had some slightly elevated sugars in hospital. A1C found recently to be 7.8 and has been monitoring sugars at home. Most in the 140's range with lowest at 127 (fasting) and highest at 283. He feels fine; better than he did at previous visit.   Has a strong family history of diabetes. Understands he may need treatment but is hesitant about injections. Also understands that options for medications are more limited due to renal status.     Allergies  Allergen Reactions  . Aricept [Donepezil Hcl] Other (See Comments)    Worsens renal function  . Codeine Hives  . Nexium [Esomeprazole] Other (See Comments)    Severely worsens renal function  . Omeprazole Hives  . Pantoprazole Sodium Other (See Comments)    Renal failure  . Tramadol Nausea And Vomiting   Current Meds  Medication Sig  . ACCU-CHEK FASTCLIX LANCETS MISC Test blood sugar three times daily  . albuterol (PROVENTIL HFA;VENTOLIN HFA) 108 (90 Base) MCG/ACT inhaler Inhale 1-2 puffs into the lungs every 4 (four) hours as needed for wheezing or shortness of breath (or cough).  Marland Kitchen BIDIL 20-37.5 MG tablet TAKE TWO TABLETS BY MOUTH THREE TIMES A DAY.  . budesonide-formoterol (SYMBICORT) 160-4.5 MCG/ACT inhaler Inhale 2 puffs into the lungs every 12 (twelve) hours.  Marland Kitchen ELIQUIS 5 MG TABS tablet TAKE ONE TABLET BY MOUTH TWICE DAILY (Patient taking differently: Take 1 tablet (5mg ) by mouth twice a  day)  . ENTRESTO 97-103 MG TAKE 1 TABLET BY MOUTH 2 (TWO) TIMES DAILY.  . fenofibrate 160 MG tablet Take 1 tablet (160 mg total) by mouth every evening.  . finasteride (PROSCAR) 5 MG tablet TAKE ONE TABLET BY MOUTH ONCE DAILY (Patient taking differently: TAKE ONE TABLET (5mg ) BY MOUTH ONCE DAILY)  . furosemide (LASIX) 20 MG tablet TAKE ONE TABLET BY MOUTH THREE TIMES A WEEK ( [place]) (Patient taking differently: TAKE ONE TABLET (20mg ) BY MOUTH THREE TIMES A WEEK ( [place]))  . glucose blood (ACCU-CHEK GUIDE) test strip 1 each by Other route 3 (three) times daily. Use as instructed  . HYDROcodone-acetaminophen (NORCO) 10-325 MG tablet Take 1 tablet by mouth every 8 (eight) hours as needed.  . lacosamide (VIMPAT) 50 MG TABS tablet Take 1.5 tablets twice a day (Patient taking differently: Take 25 mg by mouth 2 (two) times daily. Take 1.5 tablets twice a day)  . levocetirizine (XYZAL) 5 MG tablet Take 1 tablet (5 mg total) by mouth every evening.  . metoprolol succinate (TOPROL-XL) 25 MG 24 hr tablet Take 25 mg by mouth every morning.  . metoprolol succinate (TOPROL-XL) 50 MG 24 hr tablet Take 50 mg by mouth every evening. Take 25 mg in the AM and 50 mg in the PM   . mexiletine (MEXITIL) 150 MG capsule TAKE 1 CAPSULE (150 MG TOTAL) BY MOUTH 2 (TWO) TIMES DAILY.  . nitroGLYCERIN (NITROSTAT) 0.4 MG SL tablet Place 0.4 mg under  the tongue every 5 (five) minutes x 3 doses as needed for chest pain.  . ranolazine (RANEXA) 500 MG 12 hr tablet Take 1 tablet (500 mg total) daily by mouth. (Patient taking differently: Take 500 mg by mouth at bedtime. )  . rosuvastatin (CRESTOR) 40 MG tablet TAKE ONE TABLET BY MOUTH EVERY DAY IN THE EVENING  . sotalol (BETAPACE) 80 MG tablet Take 1 tablet (80 mg total) by mouth daily. (Patient taking differently: Take 80 mg by mouth at bedtime. )  . tamsulosin (FLOMAX) 0.4 MG CAPS capsule Take 0.4 mg by mouth at bedtime.   . [DISCONTINUED]  gabapentin (NEURONTIN) 300 MG capsule Take 1 capsule (300 mg total) by mouth at bedtime as needed.    Review of Systems  Constitutional: Negative for activity change, appetite change and fatigue.  Respiratory: Negative for chest tightness, shortness of breath and wheezing.   Cardiovascular: Negative for chest pain.  Gastrointestinal: Negative for abdominal pain.  Musculoskeletal: Negative for arthralgias.       Foot pain has resolved    Objective:  BP (!) 118/48 (BP Location: Left Arm, Patient Position: Sitting, Cuff Size: Normal)   Pulse 75   Temp 98 F (36.7 C) (Oral)   Wt 181 lb 14.4 oz (82.5 kg)   SpO2 96%   BMI 26.86 kg/m   Weight: 181 lb 14.4 oz (82.5 kg)   BP Readings from Last 3 Encounters:  01/11/18 (!) 118/48  01/04/18 118/64  01/03/18 122/64   Wt Readings from Last 3 Encounters:  01/11/18 181 lb 14.4 oz (82.5 kg)  01/04/18 182 lb 9 oz (82.8 kg)  01/03/18 197 lb 9.6 oz (89.6 kg)    Physical Exam  Constitutional: He appears well-developed and well-nourished. No distress.  Cardiovascular: Normal rate and regular rhythm.  Pulmonary/Chest: Effort normal and breath sounds normal.  Psychiatric: He has a normal mood and affect. His speech is normal. He exhibits abnormal recent memory.    Assessment/Plan  1. Poorly controlled type 2 diabetes mellitus with complication ([place]) I do suspect that once patient/caregivers meet with dietician/nutrition they will have much better control of blood sugars. In meanwhile, will give low dose basal insulin at bedtime.  Advised to monitor fasting sugar in the morning as well as a 2-hour postprandial at a later point during the day.  He does not need to monitor more than twice a day unless he is symptomatic.  We reviewed symptoms of low blood sugar.  He has been asymptomatic with elevated blood sugars.  We reviewed the plate method for eating including non-starchy vegetables and limiting carbohydrate intake.  We discussed some of his  favorite foods and how he can still enjoy these while keeping carbohydrates at a reasonable level. He has a followup already schedule with Tommi Rumps in about a week so I have asked them to bring in sugar log at that time.   He will also complete bloodwork today. (recheck bmp and cbc)  - POCT glucose (manual entry) - Amb Referral to Nutrition and Diabetic E - Insulin Glargine (LANTUS SOLOSTAR) 100 UNIT/ML Solostar Pen; Inject 10 Units into the skin at bedtime.  Dispense: 1 pen; Refill: 5      Micheline Rough, MD

## 2018-01-11 NOTE — Telephone Encounter (Signed)
Wife is calling with concern if elevated glucose reading- husband has new onset diagnosis diabetes and she was told to call if the glucose levels were over 200. Patient is not on medication at this time and she is concerned about his levels. They have education and possible nutrition classes  Reason for Disposition . New onset diabetes suspected (e.g., frequent urination, weakness, weight loss)  Answer Assessment - Initial Assessment Questions 1. BLOOD GLUCOSE: "What is your blood glucose level?"      283 2. ONSET: "When did you check the blood glucose?"     8:40 3. USUAL RANGE: "What is your glucose level usually?" (e.g., usual fasting morning value, usual evening value)     8/16-127 after meal  All fasting   8/17- 143                     8/18- 148                     8/19- 142                     8/20- 127                     8/21- 162 ( took late after lunch- still fasting)                     8/22-144                     8/23-283- patient has steroid shots in feet yesterday, patient also had fruit and a milk shake yesterday during day 4. KETONES: "Do you check for ketones (urine or blood test strips)?" If yes, ask: "What does the test show now?"      n/a 5. TYPE 1 or 2:  "Do you know what type of diabetes you have?"  (e.g., Type 1, Type 2, Gestational; doesn't know)      Not declared as diabetic at this point 6. INSULIN: "Do you take insulin?" "What type of insulin(s) do you use? What is the mode of delivery? (syringe, pen (e.g., injection or  pump)?"      no 7. DIABETES PILLS: "Do you take any pills for your diabetes?" If yes, ask: "Have you missed taking any pills recently?"     no 8. OTHER SYMPTOMS: "Do you have any symptoms?" (e.g., fever, frequent urination, difficulty breathing, dizziness, weakness, vomiting)     No symptoms- patient takes lasix 9. PREGNANCY: "Is there any chance you are pregnant?" "When was your last menstrual period?"     n/a  Protocols used: DIABETES -  HIGH BLOOD SUGAR-A-AH

## 2018-01-11 NOTE — Patient Instructions (Addendum)
Start lantus 10 units at bedtime.   Check fasting morning sugars and record these on daily basis. Let us know if running lower than 90 in the morning or getting low blood sugars. If feeling shaky, light headed check blood sugar. Check one other 2 hour post prandial sugar daily- you can change time of day that this is done.

## 2018-01-11 NOTE — Telephone Encounter (Signed)
Copied from [place] [id number]. Topic: Quick Communication - See Telephone Encounter >> Jan 11, 2018  3:17 PM Antonieta Iba C wrote: CRM for notification. See Telephone encounter for: 01/11/18.   Pt was seen today, provider sent in Rx for insulin but pt is stating that they are needing a Rx for the needles. Pt doesn't have any.   Pharmacy: [place], [place] - [address] [phone] (Phone) [phone] (Fax)

## 2018-01-14 ENCOUNTER — Ambulatory Visit: Payer: Medicare Other | Admitting: Podiatry

## 2018-01-14 MED ORDER — INSULIN PEN NEEDLE 31G X 5 MM MISC: 2 refills | Status: DC

## 2018-01-14 NOTE — Telephone Encounter (Signed)
Rx has been sent in. Patient notified. 

## 2018-01-17 ENCOUNTER — Encounter: Payer: Self-pay | Admitting: Registered"

## 2018-01-17 ENCOUNTER — Encounter: Payer: Medicare Other | Attending: Adult Health | Admitting: Registered"

## 2018-01-17 DIAGNOSIS — E118 Type 2 diabetes mellitus with unspecified complications: Secondary | ICD-10-CM | POA: Diagnosis not present

## 2018-01-17 DIAGNOSIS — Z713 Dietary counseling and surveillance: Secondary | ICD-10-CM | POA: Insufficient documentation

## 2018-01-17 DIAGNOSIS — E1165 Type 2 diabetes mellitus with hyperglycemia: Secondary | ICD-10-CM | POA: Insufficient documentation

## 2018-01-17 DIAGNOSIS — E119 Type 2 diabetes mellitus without complications: Secondary | ICD-10-CM | POA: Insufficient documentation

## 2018-01-17 NOTE — Patient Instructions (Addendum)
Gatorade Zero is fine, sweetening with Sugar in the Raw is okay too. When having carbs make sure you have protein with it A boost or ensure is a good way to get nutrition in the afternoon when you don't feel like lunch. Look for glucose control and for about a week getting the higher protein may also be a good idea Continue to have balanced meals and snacks You may need to have more carbs to help increase your energy. As you start increasing your energy consider getting back into more physical activity. Next time you go to [facility], ask about his history of Vitamin D deficiency, if there is any problem taking it.

## 2018-01-17 NOTE — Progress Notes (Signed)
Diabetes Self-Management Education  Visit Type: First/Initial  Appt. Start Time: 0810 Appt. End Time: 0915  01/17/2018  [name], identified by name and date of birth, is a 72 y.o. male with a diagnosis of Diabetes: Type 2.   ASSESSMENT This patient is accompanied in the office by his caregiver (family member).  End of July Pt states he was in hospital to have appendix surgery and 1 week follow-up with PCP discovered history of elevated FBG and after getting an A1c lab,  diagnosed T2DM.  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 Since surgery pt's caregiver states he does not want to move much. Pt states his stomach is still sore from surgery and has low energy. Pt's caregiver states she has changed his diet quite a bit after diagnosis and may have restricted carbs too much affected his energy level. Also they report that he often skips lunch and he has lost ~20 lbs since surgery. Pt's caregiver states Ensure in hospital seemed to be a good supplement when pt was not hungry and she is going to see if his insurance will cover.  Pt's caregiver was very excited to learn about local farmer's markets that accept SNAP benefits.  RD noted Vit d deficiency included in problem list. Because that can contribute to fatigue, RD inquired and caregiver said pt's heart MD told him he did not need to take Vit D anymore.   Diabetes Self-Management Education - 01/17/18 0807      Visit Information   Visit Type  First/Initial      Initial Visit   Diabetes Type  Type 2    Are you currently following a meal plan?  Yes   caregiver is restricting carbs   Are you taking your medications as prescribed?  Yes   lantus   Date Diagnosed  2019      Psychosocial Assessment   Other persons present  Other (comment)   caregiver   What is the last grade level you completed in school?  8th      Complications   Last HgB A1C per patient/outside source  7.8 %    How  often do you check your blood sugar?  1-2 times/day    Fasting Blood glucose range (mg/dL)  70-129    Postprandial Blood glucose range (mg/dL)  130-179   one was 268   Number of hypoglycemic episodes per month  0    Number of hyperglycemic episodes per week  1    Can you tell when your blood sugar is high?  No    Have you had a dilated eye exam in the past 12 months?  Yes    Have you had a dental exam in the past 12 months?  No    Are you checking your feet?  Yes    How many days per week are you checking your feet?  7      Dietary Intake   Breakfast  egg[place] best, sometimes toast, coffee with sugar in the raw OR cereal (cheerios or oatmeal), milk    Snack (morning)  none    Lunch  none OR PB sandwich    Snack (afternoon)  none    Dinner  baked ribs, butternut squash, spinach noodles    Snack (evening)  none OR popcorn OR granola bar    Beverage(s)  coffee, flavored water      Exercise   Exercise Type  Light (walking / raking  leaves)   yard work   How many days per week to you exercise?  2    How many minutes per day do you exercise?  30    Total minutes per week of exercise  60      Patient Education   Previous Diabetes Education  No    Nutrition management   Role of diet in the treatment of diabetes and the relationship between the three main macronutrients and blood glucose level    Physical activity and exercise   Role of exercise on diabetes management, blood pressure control and cardiac health.    Monitoring  Identified appropriate SMBG and/or A1C goals.    Psychosocial adjustment  Role of stress on diabetes      Individualized Goals (developed by patient)   Nutrition  General guidelines for healthy choices and portions discussed    Medications  take my medication as prescribed    Monitoring   test my blood glucose as discussed      Outcomes   Expected Outcomes  Demonstrated interest in learning. Expect positive outcomes    Future DMSE  4-6 wks    Program Status   Completed     Individualized Plan for Diabetes Self-Management Training:   Learning Objective:  Patient will have a greater understanding of diabetes self-management. Patient education plan is to attend individual and/or group sessions per assessed needs and concerns.   Patient Instructions  Gatorade Zero is fine, sweetening with Sugar in the Raw is okay too. When having carbs make sure you have protein with it A boost or ensure is a good way to get nutrition in the afternoon when you don't feel like lunch. Look for glucose control and for about a week getting the higher protein may also be a good idea Continue to have balanced meals and snacks You may need to have more carbs to help increase your energy. As you start increasing your energy consider getting back into more physical activity. Next time you go to [facility], ask about his history of Vitamin D deficiency, if there is any problem taking it.  Expected Outcomes:  Demonstrated interest in learning. Expect positive outcomes  Education material provided: My Plate, Snack sheet and Carbohydrate counting sheet, Chief Operating Officer, Teacher, English as a foreign language schedule, Chief Technology Officer.  If problems or questions, patient to contact team via:  Phone  Future DSME appointment: 4-6 wks

## 2018-01-18 ENCOUNTER — Encounter: Payer: Self-pay | Admitting: Internal Medicine

## 2018-01-18 ENCOUNTER — Encounter: Payer: Self-pay | Admitting: Adult Health

## 2018-01-18 ENCOUNTER — Ambulatory Visit (INDEPENDENT_AMBULATORY_CARE_PROVIDER_SITE_OTHER): Payer: Medicare Other | Admitting: Adult Health

## 2018-01-18 ENCOUNTER — Encounter: Payer: Self-pay | Admitting: Family Medicine

## 2018-01-18 ENCOUNTER — Ambulatory Visit (INDEPENDENT_AMBULATORY_CARE_PROVIDER_SITE_OTHER): Payer: Medicare Other | Admitting: Internal Medicine

## 2018-01-18 VITALS — BP 112/64 | Temp 98.1°F | Wt 181.0 lb

## 2018-01-18 VITALS — BP 104/72 | HR 60 | Ht 69.0 in | Wt 180.6 lb

## 2018-01-18 DIAGNOSIS — I472 Ventricular tachycardia, unspecified: Secondary | ICD-10-CM

## 2018-01-18 DIAGNOSIS — I255 Ischemic cardiomyopathy: Secondary | ICD-10-CM

## 2018-01-18 DIAGNOSIS — E118 Type 2 diabetes mellitus with unspecified complications: Secondary | ICD-10-CM | POA: Diagnosis not present

## 2018-01-18 DIAGNOSIS — Z95810 Presence of automatic (implantable) cardiac defibrillator: Secondary | ICD-10-CM | POA: Diagnosis not present

## 2018-01-18 DIAGNOSIS — I5022 Chronic systolic (congestive) heart failure: Secondary | ICD-10-CM | POA: Diagnosis not present

## 2018-01-18 DIAGNOSIS — E1165 Type 2 diabetes mellitus with hyperglycemia: Secondary | ICD-10-CM

## 2018-01-18 DIAGNOSIS — R2681 Unsteadiness on feet: Secondary | ICD-10-CM | POA: Diagnosis not present
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
## 2018-01-18 NOTE — Patient Instructions (Signed)
It was great seeing you today   Follow up in one month   Your blood sugars are improving. If you see that your blood sugars are falling into the 60-70's cut back the insulin to 5 units nightly. I am hopeful that we can take you off insulin in the near future

## 2018-01-18 NOTE — Patient Instructions (Signed)
Medication Instructions:  Your physician recommends that you continue on your current medications as directed. Please refer to the Current Medication list given to you today.  Labwork: None ordered.  Testing/Procedures: None ordered.  Follow-Up: Your physician wants you to follow-up in: 6 months with Dr Rayann Heman. You will receive a reminder letter in the mail two months in advance. If you don't receive a letter, please call our office to schedule the follow-up appointment.  Remote monitoring is used to monitor your Pacemaker of ICD from home. This monitoring reduces the number of office visits required to check your device to one time per year. It allows [place] to keep an eye on the functioning of your device to ensure it is working properly. You are scheduled for a device check from home on 04/10/2018. You may send your transmission at any time that day. If you have a wireless device, the transmission will be sent automatically. After your physician reviews your transmission, you will receive a postcard with your next transmission date.    Any Other Special Instructions Will Be Listed Below (If Applicable).     If you need a refill on your cardiac medications before your next appointment, please call your pharmacy.

## 2018-01-21 LAB — CUP PACEART INCLINIC DEVICE CHECK
Brady Statistic RA Percent Paced: 23 %
Date Time Interrogation Session: 20190830040000
HIGH POWER IMPEDANCE MEASURED VALUE: 45 Ohm
HighPow Impedance: 56 Ohm
Implantable Lead Implant Date: 20050922
Implantable Lead Location: 753859
Implantable Lead Model: 158
Implantable Lead Model: 5076
Implantable Pulse Generator Implant Date: 20100817
Lead Channel Impedance Value: 473 Ohm
Lead Channel Impedance Value: 526 Ohm
Lead Channel Pacing Threshold Pulse Width: 0.4 ms
Lead Channel Pacing Threshold Pulse Width: 0.4 ms
Lead Channel Sensing Intrinsic Amplitude: 2.2 mV
Lead Channel Setting Pacing Amplitude: 2 V
MDC IDC LEAD IMPLANT DT: 20050922
MDC IDC LEAD LOCATION: 753860
MDC IDC LEAD SERIAL: 156891
MDC IDC MSMT LEADCHNL RA PACING THRESHOLD AMPLITUDE: 0.4 V
MDC IDC MSMT LEADCHNL RV PACING THRESHOLD AMPLITUDE: 1 V
MDC IDC MSMT LEADCHNL RV SENSING INTR AMPL: 25 mV — AB
MDC IDC SET LEADCHNL RV PACING AMPLITUDE: 2.4 V
MDC IDC SET LEADCHNL RV PACING PULSEWIDTH: 0.4 ms
MDC IDC SET LEADCHNL RV SENSING SENSITIVITY: 0.6 mV
MDC IDC STAT BRADY RV PERCENT PACED: 1 % — AB
Pulse Gen Serial Number: 141895

## 2018-01-24 DIAGNOSIS — Z95810 Presence of automatic (implantable) cardiac defibrillator: Secondary | ICD-10-CM | POA: Insufficient documentation

## 2018-01-30 ENCOUNTER — Ambulatory Visit: Payer: Medicare Other | Admitting: Podiatry

## 2018-01-30 LAB — BASIC METABOLIC PANEL
BUN: 18 (ref 4–21)
Creatinine: 1.7 — AB (ref 0.6–1.3)
GLUCOSE: 114
Potassium: 4.4 (ref 3.4–5.3)
SODIUM: 142 (ref 137–147)

## 2018-01-30 LAB — CBC AND DIFFERENTIAL: Hemoglobin: 12.7 — AB (ref 13.5–17.5)

## 2018-02-02 ENCOUNTER — Other Ambulatory Visit (HOSPITAL_COMMUNITY): Payer: Self-pay | Admitting: Internal Medicine

## 2018-02-06 ENCOUNTER — Encounter: Payer: Self-pay | Admitting: Family Medicine

## 2018-02-08 ENCOUNTER — Ambulatory Visit (INDEPENDENT_AMBULATORY_CARE_PROVIDER_SITE_OTHER): Payer: Medicare Other | Admitting: *Deleted

## 2018-02-08 DIAGNOSIS — Z23 Encounter for immunization: Secondary | ICD-10-CM

## 2018-02-11 ENCOUNTER — Encounter (HOSPITAL_COMMUNITY): Payer: Medicare Other | Admitting: Internal Medicine

## 2018-02-12 LAB — CUP PACEART REMOTE DEVICE CHECK
Implantable Lead Implant Date: 20050922
Implantable Lead Model: 158
Implantable Lead Serial Number: 156891
Implantable Pulse Generator Implant Date: 20100817
MDC IDC LEAD IMPLANT DT: 20050922
MDC IDC LEAD LOCATION: 753859
MDC IDC LEAD LOCATION: 753860
MDC IDC PG SERIAL: 141895
MDC IDC SESS DTM: 20190924115847

## 2018-02-14 ENCOUNTER — Telehealth: Payer: Self-pay | Admitting: Adult Health

## 2018-02-14 NOTE — Telephone Encounter (Signed)
Copied from [place] [id number]. Topic: General - Other >> Feb 14, 2018  1:29 PM Yvette Rack wrote: Reason for CRM: Pt wife [name] states she pt blood pressure was 110/60 and then 159/60 and he told her that he was feeling dizzy. [name] requests that Cory's nurse call pt. Cb# [phone]

## 2018-02-15 ENCOUNTER — Ambulatory Visit (INDEPENDENT_AMBULATORY_CARE_PROVIDER_SITE_OTHER): Payer: Medicare Other | Admitting: Adult Health

## 2018-02-15 ENCOUNTER — Telehealth: Payer: Self-pay | Admitting: Adult Health

## 2018-02-15 ENCOUNTER — Encounter: Payer: Self-pay | Admitting: Adult Health

## 2018-02-15 VITALS — BP 114/62 | HR 58 | Temp 97.9°F | Wt 174.9 lb

## 2018-02-15 DIAGNOSIS — E118 Type 2 diabetes mellitus with unspecified complications: Secondary | ICD-10-CM

## 2018-02-15 DIAGNOSIS — E1165 Type 2 diabetes mellitus with hyperglycemia: Secondary | ICD-10-CM

## 2018-02-15 LAB — POCT GLYCOSYLATED HEMOGLOBIN (HGB A1C): HEMOGLOBIN A1C: 6.5 % — AB (ref 4.0–5.6)

## 2018-02-15 NOTE — Telephone Encounter (Signed)
Copied from [place] [id number]. Topic: General - Other >> Feb 15, 2018 11:56 AM Yvette Rack wrote: Reason for CRM: Pt wife [name] returned call to office. She requests call back. Cb# [phone]

## 2018-02-15 NOTE — Telephone Encounter (Signed)
Left message on machine for daughter to returning her call CRM
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
## 2018-02-22 NOTE — Telephone Encounter (Signed)
Patient seen in office 02/15/18.

## 2018-02-28 ENCOUNTER — Ambulatory Visit: Payer: Medicare Other | Admitting: Registered"

## 2018-03-05 ENCOUNTER — Other Ambulatory Visit (HOSPITAL_COMMUNITY): Payer: Self-pay | Admitting: Internal Medicine

## 2018-03-05 DIAGNOSIS — I482 Chronic atrial fibrillation, unspecified: Secondary | ICD-10-CM

## 2018-03-07 ENCOUNTER — Ambulatory Visit: Payer: Medicare Other | Admitting: Pulmonary Disease

## 2018-03-07 ENCOUNTER — Encounter: Payer: Self-pay | Admitting: Pulmonary Disease

## 2018-03-07 ENCOUNTER — Ambulatory Visit (INDEPENDENT_AMBULATORY_CARE_PROVIDER_SITE_OTHER): Payer: Medicare Other | Admitting: Pulmonary Disease

## 2018-03-07 VITALS — BP 124/78 | HR 89 | Ht 69.0 in | Wt 183.0 lb

## 2018-03-07 DIAGNOSIS — Z9989 Dependence on other enabling machines and devices: Secondary | ICD-10-CM | POA: Diagnosis not present

## 2018-03-07 DIAGNOSIS — J439 Emphysema, unspecified: Secondary | ICD-10-CM | POA: Diagnosis not present

## 2018-03-07 DIAGNOSIS — G4733 Obstructive sleep apnea (adult) (pediatric): Secondary | ICD-10-CM

## 2018-03-07 DIAGNOSIS — R059 Cough, unspecified: Secondary | ICD-10-CM

## 2018-03-07 DIAGNOSIS — R05 Cough: Secondary | ICD-10-CM
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
## 2018-03-07 NOTE — Assessment & Plan Note (Signed)
APAP setting 10-20 Chin strap for mask   We recommend that you continue using your CPAP daily >>>Keep up the hard work using your device >>> Goal should be wearing this for the entire night that you are sleeping, at least 4 to 6 hours  Remember:  . Do not drive or operate heavy machinery if tired or drowsy.  . Please notify the supply company and office if you are unable to use your device regularly due to missing supplies or machine being broken.  . Work on maintaining a healthy weight and following your recommended nutrition plan  . Maintain proper daily exercise and movement  . Maintaining proper use of your device can also help improve management of other chronic illnesses such as: Blood pressure, blood sugars, and weight management.   BiPAP/ CPAP Cleaning:  >>>Clean weekly, with Dawn soap, and bottle brush.  Set up to air dry.  1 month download   2-3 month follow up with [facility]

## 2018-03-07 NOTE — Patient Instructions (Addendum)
APAP setting 10-20 Chin strap for mask   We recommend that you continue using your CPAP daily >>>Keep up the hard work using your device >>> Goal should be wearing this for the entire night that you are sleeping, at least 4 to 6 hours  Remember:  . Do not drive or operate heavy machinery if tired or drowsy.  . Please notify the supply company and office if you are unable to use your device regularly due to missing supplies or machine being broken.  . Work on maintaining a healthy weight and following your recommended nutrition plan  . Maintain proper daily exercise and movement  . Maintaining proper use of your device can also help improve management of other chronic illnesses such as: Blood pressure, blood sugars, and weight management.   BiPAP/ CPAP Cleaning:  >>>Clean weekly, with Dawn soap, and bottle brush.  Set up to air dry.  1 month download   2-3 month follow up with [facility]    November/2019 we will be moving! We will no longer be at our [place] location.  Be on the look out for a post card/mailer to let you know we have officially moved.  Our new address and phone number will be:  [address] [place], [place] [address] Telephone number: [phone]  It is flu season:   >>>Remember to be washing your hands regularly, using hand sanitizer, be careful to use around herself with has contact with people who are sick will increase her chances of getting sick yourself. >>> Best ways to protect herself from the flu: Receive the yearly flu vaccine, practice good hand hygiene washing with soap and also using hand sanitizer when available, eat a nutritious meals, get adequate rest, hydrate appropriately   Please contact the office if your symptoms worsen or you have concerns that you are not improving.   Thank you for choosing [facility] for your healthcare, and for allowing [place] to partner with you on your healthcare journey. I am thankful to be able  to provide care to you today.   [name] FNP-C   CPAP and BiPAP Information CPAP and BiPAP are methods of helping a person breathe with the use of air pressure. CPAP stands for "continuous positive airway pressure." BiPAP stands for "bi-level positive airway pressure." In both methods, air is blown through your nose or mouth and into your air passages to help you breathe well. CPAP and BiPAP use different amounts of pressure to blow air. With CPAP, the amount of pressure stays the same while you breathe in and out. With BiPAP, the amount of pressure is increased when you breathe in (inhale) so that you can take larger breaths. Your health care provider will recommend whether CPAP or BiPAP would be more helpful for you. Why are CPAP and BiPAP treatments used? CPAP or BiPAP can be helpful if you have:  Sleep apnea.  Chronic obstructive pulmonary disease (COPD).  Heart failure.  Medical conditions that weaken the muscles of the chest including muscular dystrophy, or neurological diseases such as amyotrophic lateral sclerosis (ALS).  Other problems that cause breathing to be weak, abnormal, or difficult.  CPAP is most commonly used for obstructive sleep apnea (OSA) to keep the airways from collapsing when the muscles relax during sleep. How is CPAP or BiPAP administered? Both CPAP and BiPAP are provided by a small machine with a flexible plastic tube that attaches to a plastic mask. You wear the mask. Air is blown  through the mask into your nose or mouth. The amount of pressure that is used to blow the air can be adjusted on the machine. Your health care provider will determine the pressure setting that should be used based on your individual needs. When should CPAP or BiPAP be used? In most cases, the mask only needs to be worn during sleep. Generally, the mask needs to be worn throughout the night and during any daytime naps. People with certain medical conditions may also need to wear the  mask at other times when they are awake. Follow instructions from your health care provider about when to use the machine. What are some tips for using the mask?  Because the mask needs to be snug, some people feel trapped or closed-in (claustrophobic) when first using the mask. If you feel this way, you may need to get used to the mask. One way to do this is by holding the mask loosely over your nose or mouth and then gradually applying the mask more snugly. You can also gradually increase the amount of time that you use the mask.  Masks are available in various types and sizes. Some fit over your mouth and nose while others fit over just your nose. If your mask does not fit well, talk with your health care provider about getting a different one.  If you are using a mask that fits over your nose and you tend to breathe through your mouth, a chin strap may be applied to help keep your mouth closed.  The CPAP and BiPAP machines have alarms that may sound if the mask comes off or develops a leak.  If you have trouble with the mask, it is very important that you talk with your health care provider about finding a way to make the mask easier to tolerate. Do not stop using the mask. Stopping the use of the mask could have a negative impact on your health. What are some tips for using the machine?  Place your CPAP or BiPAP machine on a secure table or stand near an electrical outlet.  Know where the on/off switch is located on the machine.  Follow instructions from your health care provider about how to set the pressure on your machine and when you should use it.  Do not eat or drink while the CPAP or BiPAP machine is on. Food or fluids could get pushed into your lungs by the pressure of the CPAP or BiPAP.  Do not smoke. Tobacco smoke residue can damage the machine.  For home use, CPAP and BiPAP machines can be rented or purchased through home health care companies. Many different brands of  machines are available. Renting a machine before purchasing may help you find out which particular machine works well for you.  Keep the CPAP or BiPAP machine and attachments clean. Ask your health care provider for specific instructions. Get help right away if:  You have redness or open areas around your nose or mouth where the mask fits.  You have trouble using the CPAP or BiPAP machine.  You cannot tolerate wearing the CPAP or BiPAP mask.  You have pain, discomfort, and bloating in your abdomen. Summary  CPAP and BiPAP are methods of helping a person breathe with the use of air pressure.  Both CPAP and BiPAP are provided by a small machine with a flexible plastic tube that attaches to a plastic mask.  If you have trouble with the mask, it is very important that  you talk with your health care provider about finding a way to make the mask easier to tolerate. This information is not intended to replace advice given to you by your health care provider. Make sure you discuss any questions you have with your health care provider. Document Released: 02/04/2004 Document Revised: 03/27/2016 Document Reviewed: 03/27/2016 Elsevier Interactive Patient Education  2017 [employer].

## 2018-03-07 NOTE — Assessment & Plan Note (Addendum)
Improved when off beta-blocker  Patient follow-up with cardiology next week, spouse reports that he will discuss beta-blocker use at that office visit and to see if patient could come off of it

## 2018-03-07 NOTE — Assessment & Plan Note (Signed)
Continue Symbicort 160 >>> 2 puffs in the morning right when you wake up, rinse out your mouth after use, 12 hours later 2 puffs, rinse after use >>> Take this daily, no matter what >>> This is not a rescue inhaler  

## 2018-03-11 ENCOUNTER — Ambulatory Visit: Payer: Medicare Other | Admitting: Registered"

## 2018-03-13 ENCOUNTER — Ambulatory Visit (HOSPITAL_COMMUNITY)
Admission: RE | Admit: 2018-03-13 | Discharge: 2018-03-13 | Disposition: A | Discharge status: Home / Self Care | Payer: Medicare Other | Source: Ambulatory Visit | Attending: Internal Medicine | Admitting: Internal Medicine

## 2018-03-13 ENCOUNTER — Encounter (HOSPITAL_COMMUNITY): Payer: Self-pay | Admitting: Internal Medicine

## 2018-03-13 ENCOUNTER — Other Ambulatory Visit: Payer: Self-pay

## 2018-03-13 VITALS — BP 129/62 | HR 50 | Wt 187.4 lb

## 2018-03-13 DIAGNOSIS — Z888 Allergy status to other drugs, medicaments and biological substances status: Secondary | ICD-10-CM | POA: Insufficient documentation

## 2018-03-13 DIAGNOSIS — I480 Paroxysmal atrial fibrillation: Secondary | ICD-10-CM | POA: Diagnosis not present

## 2018-03-13 DIAGNOSIS — Z79899 Other long term (current) drug therapy: Secondary | ICD-10-CM | POA: Insufficient documentation

## 2018-03-13 DIAGNOSIS — I472 Ventricular tachycardia, unspecified: Secondary | ICD-10-CM

## 2018-03-13 DIAGNOSIS — Z8249 Family history of ischemic heart disease and other diseases of the circulatory system: Secondary | ICD-10-CM | POA: Insufficient documentation

## 2018-03-13 DIAGNOSIS — I5022 Chronic systolic (congestive) heart failure: Secondary | ICD-10-CM | POA: Diagnosis not present

## 2018-03-13 DIAGNOSIS — Z885 Allergy status to narcotic agent status: Secondary | ICD-10-CM | POA: Diagnosis not present

## 2018-03-13 DIAGNOSIS — Z841 Family history of disorders of kidney and ureter: Secondary | ICD-10-CM | POA: Insufficient documentation

## 2018-03-13 DIAGNOSIS — I252 Old myocardial infarction: Secondary | ICD-10-CM | POA: Diagnosis not present

## 2018-03-13 DIAGNOSIS — I2510 Atherosclerotic heart disease of native coronary artery without angina pectoris: Secondary | ICD-10-CM | POA: Insufficient documentation

## 2018-03-13 DIAGNOSIS — Z9889 Other specified postprocedural states: Secondary | ICD-10-CM | POA: Insufficient documentation

## 2018-03-13 DIAGNOSIS — J449 Chronic obstructive pulmonary disease, unspecified: Secondary | ICD-10-CM | POA: Insufficient documentation

## 2018-03-13 DIAGNOSIS — Z87891 Personal history of nicotine dependence: Secondary | ICD-10-CM | POA: Diagnosis not present

## 2018-03-13 DIAGNOSIS — G4733 Obstructive sleep apnea (adult) (pediatric): Secondary | ICD-10-CM | POA: Insufficient documentation

## 2018-03-13 DIAGNOSIS — Z836 Family history of other diseases of the respiratory system: Secondary | ICD-10-CM | POA: Insufficient documentation

## 2018-03-13 DIAGNOSIS — N183 Chronic kidney disease, stage 3 (moderate): Secondary | ICD-10-CM | POA: Diagnosis not present

## 2018-03-13 DIAGNOSIS — Z8673 Personal history of transient ischemic attack (TIA), and cerebral infarction without residual deficits: Secondary | ICD-10-CM | POA: Diagnosis not present

## 2018-03-13 DIAGNOSIS — Z7901 Long term (current) use of anticoagulants: Secondary | ICD-10-CM | POA: Insufficient documentation

## 2018-03-13 DIAGNOSIS — Z794 Long term (current) use of insulin: Secondary | ICD-10-CM | POA: Insufficient documentation

## 2018-03-13 DIAGNOSIS — Z833 Family history of diabetes mellitus: Secondary | ICD-10-CM | POA: Diagnosis not present

## 2018-03-13 DIAGNOSIS — Z95810 Presence of automatic (implantable) cardiac defibrillator: Secondary | ICD-10-CM | POA: Diagnosis not present

## 2018-03-13 DIAGNOSIS — M1990 Unspecified osteoarthritis, unspecified site: Secondary | ICD-10-CM | POA: Diagnosis not present

## 2018-03-13 DIAGNOSIS — E785 Hyperlipidemia, unspecified: Secondary | ICD-10-CM | POA: Insufficient documentation

## 2018-03-13 DIAGNOSIS — I130 Hypertensive heart and chronic kidney disease with heart failure and stage 1 through stage 4 chronic kidney disease, or unspecified chronic kidney disease: Secondary | ICD-10-CM | POA: Insufficient documentation

## 2018-03-13 MED ORDER — SOTALOL HCL 80 MG PO TABS: 80.0000 mg | ORAL_TABLET | Freq: Every day | ORAL | 6 refills | Status: DC

## 2018-03-13 NOTE — Patient Instructions (Signed)
Resume Sotalol 80mg  (1 tab) daily  Your physician recommends that you schedule a follow-up appointment in: 4 months
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
## 2018-03-20 ENCOUNTER — Encounter: Payer: Self-pay | Admitting: Podiatry

## 2018-03-20 ENCOUNTER — Ambulatory Visit (INDEPENDENT_AMBULATORY_CARE_PROVIDER_SITE_OTHER): Payer: Medicare Other | Admitting: Podiatry

## 2018-03-20 DIAGNOSIS — B351 Tinea unguium: Secondary | ICD-10-CM | POA: Diagnosis not present

## 2018-03-20 DIAGNOSIS — M79676 Pain in unspecified toe(s): Secondary | ICD-10-CM | POA: Diagnosis not present

## 2018-03-20 DIAGNOSIS — E0843 Diabetes mellitus due to underlying condition with diabetic autonomic (poly)neuropathy: Secondary | ICD-10-CM | POA: Diagnosis not present

## 2018-03-22 ENCOUNTER — Other Ambulatory Visit (HOSPITAL_COMMUNITY): Payer: Self-pay | Admitting: Internal Medicine
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
## 2018-04-02 ENCOUNTER — Encounter (HOSPITAL_COMMUNITY): Payer: Medicare Other | Admitting: Internal Medicine

## 2018-04-04 ENCOUNTER — Other Ambulatory Visit: Payer: Self-pay | Admitting: Internal Medicine

## 2018-04-05 ENCOUNTER — Other Ambulatory Visit (HOSPITAL_COMMUNITY): Payer: Self-pay

## 2018-04-05 MED ORDER — NITROGLYCERIN 0.4 MG SL SUBL: 0.4000 mg | SUBLINGUAL_TABLET | SUBLINGUAL | 3 refills | Status: DC | PRN

## 2018-04-09 ENCOUNTER — Telehealth: Payer: Self-pay | Admitting: *Deleted

## 2018-04-09 NOTE — Telephone Encounter (Signed)
[email or web address].sss  ICD shock on 04/08/18 @22 :23.

## 2018-04-10 ENCOUNTER — Ambulatory Visit (INDEPENDENT_AMBULATORY_CARE_PROVIDER_SITE_OTHER): Payer: Medicare Other

## 2018-04-10 DIAGNOSIS — I255 Ischemic cardiomyopathy: Secondary | ICD-10-CM | POA: Diagnosis not present
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
## 2018-04-10 NOTE — Telephone Encounter (Signed)
Spoke with pts wife she agreed to bring pt to apt on 04/24/18 at 8:30am with Dr. Rayann Heman. Informed pts wife that pt needs to take the Sotalol 80mg  at one time otherwise the drug was ineffective. Pts wife stated that she would tell the pt.

## 2018-04-11 NOTE — Progress Notes (Signed)
Remote ICD transmission.   

## 2018-04-12 ENCOUNTER — Other Ambulatory Visit: Payer: Self-pay

## 2018-04-12 MED ORDER — LACOSAMIDE 50 MG PO TABS: 75.0000 mg | ORAL_TABLET | Freq: Two times a day (BID) | ORAL | 0 refills | Status: DC

## 2018-04-12 MED ORDER — LACOSAMIDE 50 MG PO TABS: 50.0000 mg | ORAL_TABLET | Freq: Two times a day (BID) | ORAL | 3 refills | Status: DC

## 2018-04-12 NOTE — Telephone Encounter (Signed)
Spoke with pt's wife advising that Rx has been sent to Dr. Delice Lesch for approval.  Wife states that pharmacy is refusing to fill current Rx due to being "too early".  Wife states that there was an increase, so pt is almost out of medication.  I asked if pt had enough on hand to last over the weekend un case they run into issues at the pharmacy.  Wife states that pt only has 1 tablet on hand.  Asked wife if she could make it into our office before 4:30PM to pick up samples.  Wife states that she can.    (3) boxes of VIMPAT 50mg  #14 placed at front desk for pick up.  (this will get pt through the end of the month and well into December)

## 2018-04-12 NOTE — Telephone Encounter (Addendum)
Received call from pt's wife stating that pt needs refill of Vimpat.    Vimpat 50mg  Tab #90 with no refills Sig = take 1.5 Tab BID  Forwarded to Dr. Delice Lesch for approval.    Pt has appointment on 05/02/18 at which time further refills will be sent.

## 2018-04-15 ENCOUNTER — Telehealth: Payer: Self-pay | Admitting: Cardiology

## 2018-04-15 NOTE — Telephone Encounter (Signed)
Patient wife called and wanted to know if patient received any therapies from his ICD over the weekend. She stated that he felt bad over the weekend. He keep telling his wife that he was tired. She stated that when she spoke w/ Device Tech RN last week she was asked if he had missed any medications and she told the nurse no. But she went back and looked at his medicines and he had missed 2 doses around that shock. Instructed pt wife to send a remote transmission w his home monitor b/c we have not received any alerts from his device and that she can send a manual transmission w/ his home monitor and a Device Tech RN will review and call back with the results. Pt wife verbalized understanding.

## 2018-04-15 NOTE — Telephone Encounter (Signed)
Remote transmission reviewed. Presenting rhythm: ApVs. Last treated episode was on 04/08/18.   Called to inform patient's spouse that he did not have any treated episodes over the weekend. Wife verbalized understanding and stated that she spoke with patient's neurologist bc she believes he may have been having seizures. Wife states that neurologist increased patient's Vimpat and scheduled him a ROV for 05/02/18. I encouraged wife to call back if she needs anything further. Again, wife verbalized understanding.

## 2018-04-17 ENCOUNTER — Encounter: Payer: Self-pay | Admitting: Adult Health

## 2018-04-17 ENCOUNTER — Encounter: Payer: Self-pay | Admitting: Family Medicine

## 2018-04-17 ENCOUNTER — Ambulatory Visit (INDEPENDENT_AMBULATORY_CARE_PROVIDER_SITE_OTHER): Payer: Medicare Other | Admitting: Adult Health

## 2018-04-17 VITALS — BP 126/66 | Temp 98.0°F | Wt 191.0 lb

## 2018-04-17 DIAGNOSIS — E1165 Type 2 diabetes mellitus with hyperglycemia: Secondary | ICD-10-CM | POA: Diagnosis not present

## 2018-04-17 DIAGNOSIS — I255 Ischemic cardiomyopathy: Secondary | ICD-10-CM | POA: Diagnosis not present

## 2018-04-17 DIAGNOSIS — E118 Type 2 diabetes mellitus with unspecified complications: Secondary | ICD-10-CM | POA: Diagnosis not present

## 2018-04-17 LAB — BASIC METABOLIC PANEL
BUN: 17 mg/dL (ref 6–23)
CALCIUM: 9.6 mg/dL (ref 8.4–10.5)
CHLORIDE: 105 meq/L (ref 96–112)
CO2: 28 mEq/L (ref 19–32)
CREATININE: 1.51 mg/dL — AB (ref 0.40–1.50)
GFR: 58.77 mL/min — ABNORMAL LOW (ref >60.00)
Glucose, Bld: 152 mg/dL — ABNORMAL HIGH (ref 70–99)
Potassium: 4.5 mEq/L (ref 3.5–5.1)
Sodium: 139 mEq/L (ref 135–145)

## 2018-04-17 LAB — HEMOGLOBIN A1C: HEMOGLOBIN A1C: 6.8 % — AB (ref 4.6–6.5)

## 2018-04-17 NOTE — Patient Instructions (Signed)
I will follow up with you regarding your blood work   Make your physical for the next appointment in 3 months
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
## 2018-04-24 ENCOUNTER — Ambulatory Visit (INDEPENDENT_AMBULATORY_CARE_PROVIDER_SITE_OTHER): Payer: Medicare Other | Admitting: Internal Medicine

## 2018-04-24 ENCOUNTER — Encounter: Payer: Self-pay | Admitting: Internal Medicine

## 2018-04-24 VITALS — BP 134/82 | HR 55 | Ht 69.0 in | Wt 185.2 lb

## 2018-04-24 DIAGNOSIS — I255 Ischemic cardiomyopathy: Secondary | ICD-10-CM

## 2018-04-24 DIAGNOSIS — I472 Ventricular tachycardia, unspecified: Secondary | ICD-10-CM

## 2018-04-24 DIAGNOSIS — Z95810 Presence of automatic (implantable) cardiac defibrillator: Secondary | ICD-10-CM | POA: Diagnosis not present

## 2018-04-24 DIAGNOSIS — I480 Paroxysmal atrial fibrillation: Secondary | ICD-10-CM | POA: Diagnosis not present

## 2018-04-24 NOTE — Patient Instructions (Signed)
Medication Instructions:  Your physician recommends that you continue on your current medications as directed. Please refer to the Current Medication list given to you today.  Labwork: Your physician recommends that you return for lab work the week of January 6th for pre procedural labs  Testing/Procedures: Your physician has recommended that you have an ablation. Catheter ablation is a medical procedure used to treat some cardiac arrhythmias (irregular heartbeats). During catheter ablation, a long, thin, flexible tube is put into a blood vessel in your groin (upper thigh), or neck. This tube is called an ablation catheter. It is then guided to your heart through the blood vessel. Radio frequency waves destroy small areas of heart tissue where abnormal heartbeats may cause an arrhythmia to start. Please see the instruction sheet given to you today.   Follow-Up: Your physician recommends that you schedule a follow-up appointment in:   4 weeks after 1/14 with Dr Rayann Heman for a post procedural follow up.   Any Other Special Instructions Will Be Listed Below (If Applicable).     If you need a refill on your cardiac medications before your next appointment, please call your pharmacy.
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
## 2018-05-01 ENCOUNTER — Other Ambulatory Visit: Payer: Self-pay | Admitting: Internal Medicine

## 2018-05-02 ENCOUNTER — Other Ambulatory Visit: Payer: Self-pay

## 2018-05-02 ENCOUNTER — Encounter: Payer: Self-pay | Admitting: Neurology

## 2018-05-02 ENCOUNTER — Ambulatory Visit (INDEPENDENT_AMBULATORY_CARE_PROVIDER_SITE_OTHER): Payer: Medicare Other | Admitting: Neurology

## 2018-05-02 VITALS — BP 138/70 | HR 42 | Ht 69.0 in | Wt 187.0 lb

## 2018-05-02 DIAGNOSIS — I255 Ischemic cardiomyopathy: Secondary | ICD-10-CM | POA: Diagnosis not present

## 2018-05-02 DIAGNOSIS — R413 Other amnesia: Secondary | ICD-10-CM | POA: Diagnosis not present

## 2018-05-02 DIAGNOSIS — G40209 Localization-related (focal) (partial) symptomatic epilepsy and epileptic syndromes with complex partial seizures, not intractable, without status epilepticus: Secondary | ICD-10-CM | POA: Diagnosis not present

## 2018-05-02 MED ORDER — LACOSAMIDE 50 MG PO TABS: ORAL_TABLET | ORAL | 5 refills | Status: DC
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
## 2018-05-02 NOTE — Patient Instructions (Signed)
1. Increase Vimpat 50mg : Take 1.5 tabs in AM, 2 tabs in PM  2. Depression can also cause memory issues. Recommend seeing psychiatrist as discussed by your other doctors  3. As per [place] driving laws, no driving until 6 months seizure-free  4. Follow-up in 6 months, call for any changes  Seizure Precautions: 1. If medication has been prescribed for you to prevent seizures, take it exactly as directed.  Do not stop taking the medicine without talking to your doctor first, even if you have not had a seizure in a long time.   2. Avoid activities in which a seizure would cause danger to yourself or to others.  Don't operate dangerous machinery, swim alone, or climb in high or dangerous places, such as on ladders, roofs, or girders.  Do not drive unless your doctor says you may.  3. If you have any warning that you may have a seizure, lay down in a safe place where you can't hurt yourself.    4.  No driving for 6 months from last seizure, as per [facility].   Please refer to the following link on the [facility] website for more information: http://[email or web address]/[email or web address]   5.  Maintain good sleep hygiene. Avoid alcohol.  6.  Contact your doctor if you have any problems that may be related to the medicine you are taking.  7.  Call 911 and bring the patient back to the ED if:        A.  The seizure lasts longer than 5 minutes.       B.  The patient doesn't awaken shortly after the seizure  C.  The patient has new problems such as difficulty seeing, speaking or moving  D.  The patient was injured during the seizure  E.  The patient has a temperature over 102 F (39C)  F.  The patient vomited and now is having trouble breathing

## 2018-05-03 ENCOUNTER — Other Ambulatory Visit (HOSPITAL_COMMUNITY): Payer: Self-pay | Admitting: Internal Medicine

## 2018-05-03 NOTE — Progress Notes (Signed)
Prior Authorization initiated via [email or web address] for pt's   VIMPAT 50mg  Tab

## 2018-05-06 ENCOUNTER — Telehealth: Payer: Self-pay | Admitting: Cardiology

## 2018-05-06 NOTE — Progress Notes (Signed)
Received notice that pt's VIMPAT 50MG  Tab has been   Approved thru 05/22/2019 [place] Reference # [id number]

## 2018-05-06 NOTE — Telephone Encounter (Signed)
Remote transmission reviewed. Presenting rhythm: ApVs w/ PVCs. (21) Ventricular arrhythmias recorded since 04/24/18 - (3) VT-1 episodes treated w/ATP X 1(each), (1) "VF" episode @ 198bpm, treated with ATP x1, 41J ICD shock. All treated VT episodes were recorded on 05/04/18. No AHR episodes. 37%Ap/0%Vp. Stable lead measurements. Normal device function.   Episode list:  V-872 May 05, 2018 23:38 NonSustV Nonsustained 00:00:10 Z-563 May 04, 2018 17:40 NonSustV Nonsustained 00:00:10 O-756 May 04, 2018 14:30 VF ATPx1, 41J 00:00:52 (ICD SHOCK) V-869 May 04, 2018 14:07 NonSustV Nonsustained 00:00:28 V-868 May 04, 2018 13:35 NonSustV Nonsustained 00:00:09 V-867 May 04, 2018 11:51 VT-1 ATPx1 00:00:47 V-866 May 04, 2018 08:18 VT-1 ATPx1 00:00:48 V-865 May 04, 2018 06:17 NonSustV Nonsustained 00:00:16 E-332 May 04, 2018 06:04 NonSustV Nonsustained 00:00:07 R-518 May 04, 2018 05:48 VT-1 ATPx1 00:00:48 A-416 May 03, 2018 01:22 NonSustV Nonsustained 00:00:28 S-063 May 02, 2018 23:18 NonSustV Nonsustained 00:00:09 V-860 May 02, 2018 18:38 NonSustV Nonsustained 00:00:08 K-160 May 02, 2018 18:02 NonSustV Nonsustained 00:00:07 F-093 Apr 28, 2018 06:58 NonSustV Nonsustained 00:00:27 A-355 Apr 25, 2018 20:28 NonSustV Nonsustained 00:00:19 D-322 Apr 25, 2018 19:54 NonSustV Nonsustained 00:01:21 G-254 Apr 25, 2018 19:37 NonSustV Nonsustained 00:00:10 Y-706 Apr 25, 2018 19:30 NonSustV Nonsustained 00:00:07  Will forward information to Dr.Allred.  VT ablation scheduled for 06/04/2018.

## 2018-05-06 NOTE — Telephone Encounter (Signed)
Patient wife called and stated that pt received therapy from his ICD one time on Saturday 05-04-2018. Pt wife stated that patient is feeling fine today. Patient has not missed any medicines. Informed her that a nurse will call back when she is able to. Pt wife verbalized understanding.

## 2018-05-07 NOTE — Telephone Encounter (Signed)
Automatic transmission reviewed. Presenting rhythm As/Vs @ 60bpm. 1 VT-1 episode terminated with ATP on 05/06/18.  Episode list as of 05/07/18 at 0421: V-877 May 06, 2018 05:38 NonSustV Nonsustained 00:00:11 P-100 May 06, 2018 05:37 NonSustV Nonsustained 00:00:20  P-496 May 06, 2018 05:26 NonSustV Nonsustained 00:00:11  L-164 May 06, 2018 04:55 NonSustV Nonsustained 00:00:11  H-539 May 06, 2018 04:39 VT-1 ATPx1 00:00:47

## 2018-05-07 NOTE — Telephone Encounter (Signed)
Patient wife called back b/c no one called her on Monday 05-06-18. Informed pt wife that we were waiting for MD recommendations. She stated that patient is still falling down. Instructed her that patient would need to go the the ER. Pt wife verbalized understanding.

## 2018-05-07 NOTE — Telephone Encounter (Signed)
lpmtcb 12/17, reminding him to go to the ED.

## 2018-05-08 NOTE — Telephone Encounter (Signed)
Patient wife calling back. Pt wife stated that pt is stumbling due to increase in his medicine due to seizures. Informed her that a nurse is going to talk to MD and will call her back as soon as she can. Pt wife verbalized understanding.

## 2018-05-08 NOTE — Telephone Encounter (Signed)
Spoke with patient's wife. Advised her that Dr. Rayann Heman reviewed data and recommended no changes at this time as patient is scheduled for an ablation on 06/04/18. Patient's wife reports she contacted his neurologist and was told that the stumbling is likely due to a recent increase in his Vimpat. She is aware to call the [facility] if patient has any recurrent cardiac issues or shocks. She is appreciative of call and denies any questions or concerns at this time.

## 2018-05-13 ENCOUNTER — Other Ambulatory Visit: Payer: Self-pay | Admitting: *Deleted

## 2018-05-13 MED ORDER — RANOLAZINE ER 500 MG PO TB12: 500.0000 mg | ORAL_TABLET | Freq: Every day | ORAL | 11 refills | Status: DC

## 2018-05-27 ENCOUNTER — Other Ambulatory Visit: Payer: Self-pay | Admitting: Internal Medicine

## 2018-05-27 ENCOUNTER — Other Ambulatory Visit: Payer: Medicare Other | Admitting: *Deleted

## 2018-05-27 DIAGNOSIS — I480 Paroxysmal atrial fibrillation: Secondary | ICD-10-CM

## 2018-05-27 DIAGNOSIS — Z95810 Presence of automatic (implantable) cardiac defibrillator: Secondary | ICD-10-CM

## 2018-05-27 DIAGNOSIS — I472 Ventricular tachycardia, unspecified: Secondary | ICD-10-CM

## 2018-05-28 LAB — BASIC METABOLIC PANEL
BUN/Creatinine Ratio: 10 (ref 10–24)
BUN: 17 mg/dL (ref 8–27)
CO2: 23 mmol/L (ref 20–29)
Calcium: 9.4 mg/dL (ref 8.6–10.2)
Chloride: 104 mmol/L (ref 96–106)
Creatinine, Ser: 1.7 mg/dL — ABNORMAL HIGH (ref 0.76–1.27)
GFR calc non Af Amer: 40 mL/min/{1.73_m2} — ABNORMAL LOW (ref >59)
GFR, EST AFRICAN AMERICAN: 46 mL/min/{1.73_m2} — AB (ref >59)
Glucose: 176 mg/dL — ABNORMAL HIGH (ref 65–99)
Potassium: 4.4 mmol/L (ref 3.5–5.2)
Sodium: 141 mmol/L (ref 134–144)

## 2018-05-28 LAB — CBC
Hematocrit: 42.1 % (ref 37.5–51.0)
Hemoglobin: 14.2 g/dL (ref 13.0–17.7)
MCH: 30.3 pg (ref 26.6–33.0)
MCHC: 33.7 g/dL (ref 31.5–35.7)
MCV: 90 fL (ref 79–97)
Platelets: 277 10*3/uL (ref 150–450)
RBC: 4.69 x10E6/uL (ref 4.14–5.80)
RDW: 13 % (ref 11.6–15.4)
WBC: 6.8 10*3/uL (ref 3.4–10.8)

## 2018-05-29 ENCOUNTER — Telehealth: Payer: Self-pay | Admitting: Internal Medicine

## 2018-05-29 NOTE — Telephone Encounter (Signed)
New message   Per patient's wife states that the patient is scheduled to have ablation surgery on 06/04/2018. She wants to know if he can stay overnight in hospital for this surgery?

## 2018-05-29 NOTE — Telephone Encounter (Signed)
Returned call.  Advised Pt would probably stay the night.  Advised ok to bring cpap machine.  No further questions.

## 2018-05-31 ENCOUNTER — Ambulatory Visit: Payer: Self-pay

## 2018-05-31 ENCOUNTER — Telehealth: Payer: Self-pay | Admitting: Internal Medicine

## 2018-05-31 ENCOUNTER — Telehealth: Payer: Self-pay | Admitting: Neurology

## 2018-05-31 NOTE — Telephone Encounter (Signed)
New Message    Patient wanted to let you know that her dad's blood sugar has been elevated currently its 182.  PCP told her to have him drink water, and if it's an issue still they will take him to the hospital.  Patient has appt for ablation on 06/04/18 and wanted to make sure the appt is canceled if the patient happens to be admitted and can't have the surgery so he doesn't get charged for it.

## 2018-05-31 NOTE — Telephone Encounter (Signed)
Left message for patient to call back  

## 2018-05-31 NOTE — Telephone Encounter (Signed)
Patient's wife called and says that his blood sugars have been running in the 200's and normally in the 100's. She says he's been having blurry vision and dizzy today. She checked it right before calling and it was 182. She says that he is in the kitchen eating a sandwich right now. She gave him the phone and I asked is he still dizzy, he denies at this time. I asked about his vision, he says that his eyes are tired, a little blurry. I asked about other symptoms and the wife says that he's been having frequent urination and feeling weak and tired lately. According to protocol, see PCP within 24 hours, no availability with PCP, she asks to be scheduled at the Saturday clinic at [facility] tomorrow and the first available with [facility]. Appointment scheduled for tomorrow, 06/01/18 at 0900 with Dr. Alain Marion at [facility] and on Wednesday, 06/05/18 at 0800 with Dorothyann Peng, NP. Care advice given, wife verbalized understanding.   Reason for Disposition . [1] Symptoms of high blood sugar (e.g., frequent urination, weak, weight loss) AND [2] not able to test blood glucose  Answer Assessment - Initial Assessment Questions 1. BLOOD GLUCOSE: "What is your blood glucose level?"      182 2. ONSET: "When did you check the blood glucose?"     Now 3. USUAL RANGE: "What is your glucose level usually?" (e.g., usual fasting morning value, usual evening value)     Fasting morning 101; usual night 125 4. KETONES: "Do you check for ketones (urine or blood test strips)?" If yes, ask: "What does the test show now?"      No 5. TYPE 1 or 2:  "Do you know what type of diabetes you have?"  (e.g., Type 1, Type 2, Gestational; doesn't know)      Type 2 6. INSULIN: "Do you take insulin?" "What type of insulin(s) do you use? What is the mode of delivery? (syringe, pen (e.g., injection or  pump)?"      Yes 7. DIABETES PILLS: "Do you take any pills for your diabetes?" If yes, ask: "Have you missed taking any pills recently?"     No 8. OTHER  SYMPTOMS: "Do you have any symptoms?" (e.g., fever, frequent urination, difficulty breathing, dizziness, weakness, vomiting)     Dizzy, blurred vision, frequent urination, weakness 9. PREGNANCY: "Is there any chance you are pregnant?" "When was your last menstrual period?"     N/A  Protocols used: DIABETES - HIGH BLOOD SUGAR-A-AH

## 2018-05-31 NOTE — Telephone Encounter (Signed)
Patient's wife? Left vm about medication questions. Please call her back at [phone]. Thanks!

## 2018-06-01 ENCOUNTER — Encounter: Payer: Self-pay | Admitting: Internal Medicine

## 2018-06-01 ENCOUNTER — Ambulatory Visit (INDEPENDENT_AMBULATORY_CARE_PROVIDER_SITE_OTHER): Payer: Medicare Other | Admitting: Internal Medicine

## 2018-06-01 VITALS — BP 124/80 | HR 58 | Temp 98.3°F | Ht 69.0 in | Wt 189.0 lb

## 2018-06-01 DIAGNOSIS — E118 Type 2 diabetes mellitus with unspecified complications: Secondary | ICD-10-CM

## 2018-06-01 DIAGNOSIS — I480 Paroxysmal atrial fibrillation: Secondary | ICD-10-CM | POA: Diagnosis not present

## 2018-06-01 DIAGNOSIS — E1165 Type 2 diabetes mellitus with hyperglycemia: Secondary | ICD-10-CM | POA: Diagnosis not present

## 2018-06-01 DIAGNOSIS — I5022 Chronic systolic (congestive) heart failure: Secondary | ICD-10-CM

## 2018-06-01 DIAGNOSIS — R413 Other amnesia: Secondary | ICD-10-CM | POA: Insufficient documentation

## 2018-06-01 LAB — POCT GLUCOSE (DEVICE FOR HOME USE): POC GLUCOSE: 177 mg/dL — AB (ref 70–99)

## 2018-06-01 NOTE — Assessment & Plan Note (Signed)
C/o elevated sugar last night 226. He missed one day of insulin due to memory issues and due to CVA. He lives alone in [place]. Just moved in to live w/dtr in [place]. Dtr will be giving shots. They want to stick w/Lantus - increase to 10 u/d. Titrate up by 1 unit a day for goal sugars of 110-140 in am.

## 2018-06-01 NOTE — Assessment & Plan Note (Signed)
C/o memory issues and due to CVA. He lives alone in [place]. Just moved in to live w/dtr in [place] 1/20.  Please keep appointment w/Cory Carlisle Cater, NP

## 2018-06-01 NOTE — Assessment & Plan Note (Signed)
Ablation is planned on Tue

## 2018-06-01 NOTE — Patient Instructions (Addendum)
Lantus - increase to 10 units/day. Titrate up by 1 unit a day for goal sugars of 110-140 in am.   Please keep appointment w/Cory Carlisle Cater, NP

## 2018-06-01 NOTE — Progress Notes (Signed)
Subjective:  [name], male    DOB: 1945-08-17  Age: 73 y.o. [name]  CC: No chief complaint on file.   HPI [name] presents for elevated sugar last night 226. He missed one day of insulin due to memory issues and due to CVA. He lives alone in [place]. Just moved in to live w/dtr in [place].Dtr will be giving shots. They want to stick w/Lantus.  Outpatient Medications Prior to Visit  Medication Sig Dispense Refill  . ACCU-CHEK FASTCLIX LANCETS MISC Test blood sugar three times daily 270 each 1  . albuterol (PROVENTIL HFA;VENTOLIN HFA) 108 (90 Base) MCG/ACT inhaler Inhale 1-2 puffs into the lungs every 4 (four) hours as needed for wheezing or shortness of breath (or cough). 1 Inhaler 5  . budesonide-formoterol (SYMBICORT) 160-4.5 MCG/ACT inhaler Inhale 2 puffs into the lungs every 12 (twelve) hours. 1 Inhaler 6  . ELIQUIS 5 MG TABS tablet TAKE ONE TABLET BY MOUTH TWICE DAILY (Patient taking differently: Take 5 mg by mouth 2 (two) times daily. ) 60 tablet 6  . ENTRESTO 97-103 MG TAKE 1 TABLET BY MOUTH 2 (TWO) TIMES DAILY. (Patient taking differently: Take 1 tablet by mouth 2 (two) times daily. ) 60 tablet 5  . fenofibrate 160 MG tablet Take 1 tablet (160 mg total) by mouth every evening. (Patient taking differently: Take 160 mg by mouth at bedtime. ) 90 tablet 3  . finasteride (PROSCAR) 5 MG tablet TAKE ONE TABLET BY MOUTH ONCE DAILY (Patient taking differently: Take 5 mg by mouth daily. ) 30 tablet 3  . furosemide (LASIX) 20 MG tablet TAKE ONE TABLET BY MOUTH THREE TIMES A WEEK ( [place]) (Patient taking differently: Take 20 mg by mouth every Monday, Wednesday, and Friday. ) 45 tablet 2  . glucose blood (ACCU-CHEK GUIDE) test strip 1 each by Other route 3 (three) times daily. Use as instructed 270 each 1  . insulin glargine (LANTUS) 100 unit/mL SOPN Inject 7 Units into the skin at bedtime.    . Insulin Pen Needle 31G X 5 MM MISC Use daily at  bedtime. 1 each 2  . isosorbide-hydrALAZINE (BIDIL) 20-37.5 MG tablet Take 2 tablets by mouth 2 (two) times daily.    Marland Kitchen lacosamide (VIMPAT) 50 MG TABS tablet Take 1.5 tablets in AM, 2 tablets in PM (Patient taking differently: Take 75-100 mg by mouth See admin instructions. Take 75 mg by mouth in the morning and take 100 mg by mouth in the evening) 105 tablet 5  . levocetirizine (XYZAL) 5 MG tablet Take 1 tablet (5 mg total) by mouth every evening. 90 tablet 3  . metoprolol succinate (TOPROL-XL) 50 MG 24 hr tablet Take 25-50 mg by mouth See admin instructions. Take 25 mg in the AM and 50 mg in the PM     . mirabegron ER (MYRBETRIQ) 25 MG TB24 tablet Take 25 mg by mouth daily.     . nitroGLYCERIN (NITROSTAT) 0.4 MG SL tablet Place 1 tablet (0.4 mg total) under the tongue every 5 (five) minutes x 3 doses as needed for chest pain. 25 tablet 3  . Polyethyl Glycol-Propyl Glycol (SYSTANE) 0.4-0.3 % SOLN Place 1 drop into both eyes daily as needed (for dry eyes).    . ranolazine (RANEXA) 500 MG 12 hr tablet Take 1 tablet (500 mg total) by mouth daily. (Patient taking differently: Take 500 mg by mouth at bedtime. ) 30 tablet 11  . rosuvastatin (CRESTOR) 40 MG tablet TAKE ONE  TABLET BY MOUTH EVERY DAY IN THE EVENING (Patient taking differently: Take 40 mg by mouth every evening. ) 90 tablet 3  . sotalol (BETAPACE) 80 MG tablet Take 1 tablet (80 mg total) by mouth daily. (Patient taking differently: Take 80 mg by mouth at bedtime. ) 30 tablet 6  . tamsulosin (FLOMAX) 0.4 MG CAPS capsule Take 0.4 mg by mouth at bedtime.      No facility-administered medications prior to visit.     ROS: Review of Systems  Constitutional: Positive for fatigue. Negative for appetite change and unexpected weight change.  HENT: Negative for congestion, nosebleeds, sneezing, sore throat and trouble swallowing.   Eyes: Negative for itching and visual disturbance.  Respiratory: Negative for cough.   Cardiovascular: Negative for  chest pain, palpitations and leg swelling.  Gastrointestinal: Negative for abdominal distention, blood in stool, diarrhea and nausea.  Genitourinary: Negative for frequency and hematuria.  Musculoskeletal: Negative for back pain, gait problem, joint swelling and neck pain.  Skin: Negative for rash.  Neurological: Negative for dizziness, tremors, speech difficulty and weakness.  Psychiatric/Behavioral: Positive for decreased concentration. Negative for agitation, dysphoric mood and sleep disturbance. The patient is not nervous/anxious.     Objective:  BP 124/80   Pulse (!) 58   Temp 98.3 F (36.8 C) (Oral)   Ht 5\' 9"  (1.753 m)   Wt 189 lb (85.7 kg)   SpO2 95%   BMI 27.91 kg/m   BP Readings from Last 3 Encounters:  06/01/18 124/80  05/02/18 138/70  04/24/18 134/82    Wt Readings from Last 3 Encounters:  06/01/18 189 lb (85.7 kg)  05/02/18 187 lb (84.8 kg)  04/24/18 185 lb 3.2 oz (84 kg)    Physical Exam Constitutional:      General: He is not in acute distress.    Appearance: He is well-developed.     Comments: NAD  Eyes:     Conjunctiva/sclera: Conjunctivae normal.     Pupils: Pupils are equal, round, and reactive to light.  Neck:     Musculoskeletal: Normal range of motion.     Thyroid: No thyromegaly.     Vascular: No JVD.  Cardiovascular:     Rate and Rhythm: Normal rate. Rhythm irregular.     Heart sounds: Normal heart sounds. No murmur. No friction rub. No gallop.   Pulmonary:     Effort: Pulmonary effort is normal. No respiratory distress.     Breath sounds: Normal breath sounds. No wheezing or rales.  Chest:     Chest wall: No tenderness.  Abdominal:     General: Bowel sounds are normal. There is no distension.     Palpations: Abdomen is soft. There is no mass.     Tenderness: There is no abdominal tenderness. There is no guarding or rebound.  Musculoskeletal: Normal range of motion.        General: No tenderness.  Lymphadenopathy:     Cervical: No  cervical adenopathy.  Skin:    General: Skin is warm and dry.     Findings: No rash.  Neurological:     Mental Status: He is alert and oriented to person, place, and time.     Cranial Nerves: No cranial nerve deficit.     Motor: No abnormal muscle tone.     Coordination: Coordination normal.     Gait: Gait normal.     Deep Tendon Reflexes: Reflexes are normal and symmetric.  Psychiatric:        Behavior: Behavior  normal.        Thought Content: Thought content normal.        Judgment: Judgment normal.     Lab Results  Component Value Date   WBC 6.8 05/27/2018   HGB 14.2 05/27/2018   HCT 42.1 05/27/2018   PLT 277 05/27/2018   GLUCOSE 176 (H) 05/27/2018   CHOL 113 03/01/2016   TRIG 95 03/01/2016   HDL 24 (L) 03/01/2016   LDLCALC 70 03/01/2016   ALT 16 12/26/2017   AST 18 12/26/2017   NA 141 05/27/2018   K 4.4 05/27/2018   CL 104 05/27/2018   CREATININE 1.70 (H) 05/27/2018   BUN 17 05/27/2018   CO2 23 05/27/2018   TSH 2.92 02/09/2016   PSA 0.35 01/03/2016   INR 1.16 12/21/2017   HGBA1C 6.8 (H) 04/17/2018    No results found.  Assessment & Plan:   Diagnoses and all orders for this visit:  Poorly controlled type 2 diabetes mellitus with complication ([place]) -     POCT Glucose (Device for Home Use)     No orders of the defined types were placed in this encounter.    Follow-up: No follow-ups on file.  Walker Kehr, MD

## 2018-06-01 NOTE — Assessment & Plan Note (Signed)
Compensated 

## 2018-06-03 NOTE — Telephone Encounter (Signed)
Returned call to family.  Ok for procedure tomorrow.

## 2018-06-03 NOTE — Telephone Encounter (Signed)
Follow up    Patients wife is returning call. She states that they did take him to the PCP and the patients blood sugar had lowered and the PCP says he can have the surgery. Please call to discuss.

## 2018-06-04 ENCOUNTER — Other Ambulatory Visit: Payer: Self-pay

## 2018-06-04 ENCOUNTER — Ambulatory Visit (HOSPITAL_COMMUNITY): Payer: Medicare Other | Admitting: Anesthesiology

## 2018-06-04 ENCOUNTER — Ambulatory Visit (HOSPITAL_COMMUNITY)
Admission: RE | Admit: 2018-06-04 | Discharge: 2018-06-05 | Disposition: A | Discharge status: Home / Self Care | Payer: Medicare Other | Attending: Internal Medicine | Admitting: Internal Medicine

## 2018-06-04 ENCOUNTER — Encounter (HOSPITAL_COMMUNITY): Payer: Self-pay | Admitting: Certified Registered"

## 2018-06-04 ENCOUNTER — Encounter (HOSPITAL_COMMUNITY)
Admission: RE | Disposition: A | Discharge status: Home / Self Care | Payer: Self-pay | Source: Home / Self Care | Attending: Internal Medicine

## 2018-06-04 DIAGNOSIS — M1990 Unspecified osteoarthritis, unspecified site: Secondary | ICD-10-CM | POA: Diagnosis not present

## 2018-06-04 DIAGNOSIS — I509 Heart failure, unspecified: Secondary | ICD-10-CM | POA: Insufficient documentation

## 2018-06-04 DIAGNOSIS — Z885 Allergy status to narcotic agent status: Secondary | ICD-10-CM | POA: Diagnosis not present

## 2018-06-04 DIAGNOSIS — N183 Chronic kidney disease, stage 3 (moderate): Secondary | ICD-10-CM | POA: Insufficient documentation

## 2018-06-04 DIAGNOSIS — I130 Hypertensive heart and chronic kidney disease with heart failure and stage 1 through stage 4 chronic kidney disease, or unspecified chronic kidney disease: Secondary | ICD-10-CM | POA: Diagnosis not present

## 2018-06-04 DIAGNOSIS — G4733 Obstructive sleep apnea (adult) (pediatric): Secondary | ICD-10-CM | POA: Diagnosis not present

## 2018-06-04 DIAGNOSIS — Z7901 Long term (current) use of anticoagulants: Secondary | ICD-10-CM | POA: Insufficient documentation

## 2018-06-04 DIAGNOSIS — I255 Ischemic cardiomyopathy: Secondary | ICD-10-CM | POA: Diagnosis not present

## 2018-06-04 DIAGNOSIS — Z79899 Other long term (current) drug therapy: Secondary | ICD-10-CM | POA: Insufficient documentation

## 2018-06-04 DIAGNOSIS — K219 Gastro-esophageal reflux disease without esophagitis: Secondary | ICD-10-CM | POA: Insufficient documentation

## 2018-06-04 DIAGNOSIS — Z8673 Personal history of transient ischemic attack (TIA), and cerebral infarction without residual deficits: Secondary | ICD-10-CM | POA: Diagnosis not present

## 2018-06-04 DIAGNOSIS — R569 Unspecified convulsions: Secondary | ICD-10-CM | POA: Diagnosis not present

## 2018-06-04 DIAGNOSIS — Z7951 Long term (current) use of inhaled steroids: Secondary | ICD-10-CM | POA: Diagnosis not present

## 2018-06-04 DIAGNOSIS — Z888 Allergy status to other drugs, medicaments and biological substances status: Secondary | ICD-10-CM | POA: Insufficient documentation

## 2018-06-04 DIAGNOSIS — I472 Ventricular tachycardia: Secondary | ICD-10-CM

## 2018-06-04 DIAGNOSIS — J449 Chronic obstructive pulmonary disease, unspecified: Secondary | ICD-10-CM | POA: Insufficient documentation

## 2018-06-04 DIAGNOSIS — I2510 Atherosclerotic heart disease of native coronary artery without angina pectoris: Secondary | ICD-10-CM | POA: Diagnosis not present

## 2018-06-04 DIAGNOSIS — I252 Old myocardial infarction: Secondary | ICD-10-CM | POA: Diagnosis not present

## 2018-06-04 DIAGNOSIS — Z87828 Personal history of other (healed) physical injury and trauma: Secondary | ICD-10-CM | POA: Diagnosis not present

## 2018-06-04 DIAGNOSIS — Z794 Long term (current) use of insulin: Secondary | ICD-10-CM | POA: Insufficient documentation

## 2018-06-04 DIAGNOSIS — I480 Paroxysmal atrial fibrillation: Secondary | ICD-10-CM | POA: Insufficient documentation

## 2018-06-04 DIAGNOSIS — E785 Hyperlipidemia, unspecified: Secondary | ICD-10-CM | POA: Insufficient documentation

## 2018-06-04 HISTORY — PX: V TACH ABLATION: EP1227

## 2018-06-04 HISTORY — DX: Obstructive sleep apnea (adult) (pediatric): G47.33

## 2018-06-04 HISTORY — DX: Pneumonia, unspecified organism: J18.9

## 2018-06-04 HISTORY — DX: Dependence on other enabling machines and devices: Z99.89

## 2018-06-04 HISTORY — DX: Presence of automatic (implantable) cardiac defibrillator: Z95.810

## 2018-06-04 LAB — GLUCOSE, CAPILLARY: GLUCOSE-CAPILLARY: 234 mg/dL — AB (ref 70–99)

## 2018-06-04 LAB — POCT ACTIVATED CLOTTING TIME
Activated Clotting Time: 191 seconds
Activated Clotting Time: 241 seconds
Activated Clotting Time: 301 seconds
Activated Clotting Time: 224 seconds
Activated Clotting Time: 186 seconds

## 2018-06-04 SURGERY — V TACH ABLATION
Anesthesia: General

## 2018-06-04 MED ORDER — HEPARIN SODIUM (PORCINE) 1000 UNIT/ML IJ SOLN
INTRAMUSCULAR | Status: DC | PRN
  Administered 2018-06-04 (×3): 5000 [IU] via INTRAVENOUS

## 2018-06-04 MED ORDER — SODIUM CHLORIDE 0.9 % IV SOLN: 250.0000 mL | INTRAVENOUS | Status: DC | PRN

## 2018-06-04 MED ORDER — SOTALOL HCL 80 MG PO TABS
80.0000 mg | ORAL_TABLET | Freq: Every day | ORAL | Status: DC
  Administered 2018-06-05: 80 mg via ORAL
  Filled 2018-06-04: qty 1

## 2018-06-04 MED ORDER — HEPARIN SODIUM (PORCINE) 1000 UNIT/ML IJ SOLN
INTRAMUSCULAR | Status: DC | PRN
  Administered 2018-06-04: 1000 [IU] via INTRAVENOUS

## 2018-06-04 MED ORDER — LEVOCETIRIZINE DIHYDROCHLORIDE 5 MG PO TABS: 5.0000 mg | ORAL_TABLET | Freq: Every evening | ORAL | Status: DC

## 2018-06-04 MED ORDER — SODIUM CHLORIDE 0.9% FLUSH
3.0000 mL | Freq: Two times a day (BID) | INTRAVENOUS | Status: DC
  Administered 2018-06-04: 3 mL via INTRAVENOUS

## 2018-06-04 MED ORDER — LACOSAMIDE 50 MG PO TABS
75.0000 mg | ORAL_TABLET | Freq: Every day | ORAL | Status: DC
  Administered 2018-06-05: 11:00:00 75 mg via ORAL
  Filled 2018-06-04: qty 2

## 2018-06-04 MED ORDER — HEPARIN SODIUM (PORCINE) 1000 UNIT/ML IJ SOLN
INTRAMUSCULAR | Status: AC
  Filled 2018-06-04: qty 1

## 2018-06-04 MED ORDER — ONDANSETRON HCL 4 MG/2ML IJ SOLN: 4.0000 mg | Freq: Four times a day (QID) | INTRAMUSCULAR | Status: DC | PRN

## 2018-06-04 MED ORDER — ROCURONIUM BROMIDE 10 MG/ML (PF) SYRINGE
PREFILLED_SYRINGE | INTRAVENOUS | Status: DC | PRN
  Administered 2018-06-04: 50 mg via INTRAVENOUS

## 2018-06-04 MED ORDER — SODIUM CHLORIDE 0.9% FLUSH: 3.0000 mL | INTRAVENOUS | Status: DC | PRN

## 2018-06-04 MED ORDER — ISOSORB DINITRATE-HYDRALAZINE 20-37.5 MG PO TABS
2.0000 | ORAL_TABLET | Freq: Two times a day (BID) | ORAL | Status: DC
  Administered 2018-06-05: 2 via ORAL
  Filled 2018-06-04: qty 2

## 2018-06-04 MED ORDER — APIXABAN 5 MG PO TABS
5.0000 mg | ORAL_TABLET | Freq: Two times a day (BID) | ORAL | Status: DC
  Administered 2018-06-04 – 2018-06-05 (×2): 5 mg via ORAL
  Filled 2018-06-04 (×2): qty 1

## 2018-06-04 MED ORDER — PROPOFOL 10 MG/ML IV BOLUS
INTRAVENOUS | Status: DC | PRN
  Administered 2018-06-04: 60 mg via INTRAVENOUS
  Administered 2018-06-04: 30 mg via INTRAVENOUS

## 2018-06-04 MED ORDER — ACETAMINOPHEN 325 MG PO TABS: 650.0000 mg | ORAL_TABLET | ORAL | Status: DC | PRN

## 2018-06-04 MED ORDER — DEXAMETHASONE SODIUM PHOSPHATE 10 MG/ML IJ SOLN
INTRAMUSCULAR | Status: DC | PRN
  Administered 2018-06-04: 4 mg via INTRAVENOUS

## 2018-06-04 MED ORDER — ALBUTEROL SULFATE (2.5 MG/3ML) 0.083% IN NEBU: 3.0000 mL | INHALATION_SOLUTION | RESPIRATORY_TRACT | Status: DC | PRN

## 2018-06-04 MED ORDER — LACOSAMIDE 50 MG PO TABS
100.0000 mg | ORAL_TABLET | Freq: Every day | ORAL | Status: DC
  Administered 2018-06-04: 22:00:00 100 mg via ORAL
  Filled 2018-06-04: qty 2

## 2018-06-04 MED ORDER — TAMSULOSIN HCL 0.4 MG PO CAPS
0.4000 mg | ORAL_CAPSULE | Freq: Every day | ORAL | Status: DC
  Administered 2018-06-04: 22:00:00 0.4 mg via ORAL
  Filled 2018-06-04: qty 1

## 2018-06-04 MED ORDER — HEPARIN (PORCINE) IN NACL 1000-0.9 UT/500ML-% IV SOLN
INTRAVENOUS | Status: AC
  Filled 2018-06-04: qty 500

## 2018-06-04 MED ORDER — PROTAMINE SULFATE 10 MG/ML IV SOLN
INTRAVENOUS | Status: DC | PRN
  Administered 2018-06-04: 40 mg via INTRAVENOUS

## 2018-06-04 MED ORDER — METOPROLOL SUCCINATE ER 25 MG PO TB24
50.0000 mg | ORAL_TABLET | Freq: Every day | ORAL | Status: DC
  Administered 2018-06-04: 22:00:00 50 mg via ORAL
  Filled 2018-06-04: qty 2

## 2018-06-04 MED ORDER — BUPIVACAINE HCL (PF) 0.25 % IJ SOLN
INTRAMUSCULAR | Status: AC
  Filled 2018-06-04: qty 30

## 2018-06-04 MED ORDER — SACUBITRIL-VALSARTAN 97-103 MG PO TABS
1.0000 | ORAL_TABLET | Freq: Two times a day (BID) | ORAL | Status: DC
  Administered 2018-06-05: 1 via ORAL
  Filled 2018-06-04: qty 1

## 2018-06-04 MED ORDER — RANOLAZINE ER 500 MG PO TB12
500.0000 mg | ORAL_TABLET | Freq: Every day | ORAL | Status: DC
  Administered 2018-06-04: 22:00:00 500 mg via ORAL
  Filled 2018-06-04: qty 1

## 2018-06-04 MED ORDER — SODIUM CHLORIDE 0.9 % IV SOLN
INTRAVENOUS | Status: DC
  Administered 2018-06-04: 08:00:00 via INTRAVENOUS

## 2018-06-04 MED ORDER — MIDAZOLAM HCL 5 MG/5ML IJ SOLN
INTRAMUSCULAR | Status: DC | PRN
  Administered 2018-06-04: 1 mg via INTRAVENOUS

## 2018-06-04 MED ORDER — SODIUM CHLORIDE 0.9 % IV SOLN
INTRAVENOUS | Status: DC | PRN
  Administered 2018-06-04: 40 ug/min via INTRAVENOUS

## 2018-06-04 MED ORDER — METOPROLOL SUCCINATE ER 25 MG PO TB24
25.0000 mg | ORAL_TABLET | Freq: Every day | ORAL | Status: DC
  Administered 2018-06-05: 25 mg via ORAL
  Filled 2018-06-04: qty 1

## 2018-06-04 MED ORDER — INSULIN GLARGINE 100 UNIT/ML ~~LOC~~ SOLN
7.0000 [IU] | Freq: Every day | SUBCUTANEOUS | Status: DC
  Administered 2018-06-04: 7 [IU] via SUBCUTANEOUS
  Filled 2018-06-04: qty 0.07

## 2018-06-04 MED ORDER — OFF THE BEAT BOOK
Freq: Once | Status: AC
  Administered 2018-06-04: 22:00:00
  Filled 2018-06-04: qty 1

## 2018-06-04 MED ORDER — BUPIVACAINE HCL (PF) 0.25 % IJ SOLN
INTRAMUSCULAR | Status: DC | PRN
  Administered 2018-06-04: 30 mL

## 2018-06-04 MED ORDER — PHENYLEPHRINE 40 MCG/ML (10ML) SYRINGE FOR IV PUSH (FOR BLOOD PRESSURE SUPPORT)
PREFILLED_SYRINGE | INTRAVENOUS | Status: DC | PRN
  Administered 2018-06-04: 80 ug via INTRAVENOUS
  Administered 2018-06-04 (×3): 40 ug via INTRAVENOUS

## 2018-06-04 MED ORDER — LIDOCAINE 2% (20 MG/ML) 5 ML SYRINGE
INTRAMUSCULAR | Status: DC | PRN
  Administered 2018-06-04: 40 mg via INTRAVENOUS

## 2018-06-04 MED ORDER — FENTANYL CITRATE (PF) 100 MCG/2ML IJ SOLN
INTRAMUSCULAR | Status: DC | PRN
  Administered 2018-06-04 (×3): 50 ug via INTRAVENOUS

## 2018-06-04 MED ORDER — MIRABEGRON ER 25 MG PO TB24
25.0000 mg | ORAL_TABLET | Freq: Every day | ORAL | Status: DC
  Administered 2018-06-05: 25 mg via ORAL
  Filled 2018-06-04: qty 1

## 2018-06-04 MED ORDER — ONDANSETRON HCL 4 MG/2ML IJ SOLN
INTRAMUSCULAR | Status: DC | PRN
  Administered 2018-06-04: 4 mg via INTRAVENOUS

## 2018-06-04 SURGICAL SUPPLY — 18 items
CATH JOSEPH QUAD ALLRED 6F REP (CATHETERS) ×1 IMPLANT
CATH MAPPNG PENTARAY F 2-6-2MM (CATHETERS) IMPLANT
CATH SMTCH THERMOCOOL SF DF (CATHETERS) ×1 IMPLANT
CATH SOUNDSTAR ECO 8FR (CATHETERS) ×1 IMPLANT
CATH WEBSTER BI DIR CS D-F CRV (CATHETERS) ×1 IMPLANT
PACK EP LATEX FREE (CUSTOM PROCEDURE TRAY) ×2
PACK EP LF (CUSTOM PROCEDURE TRAY) ×1 IMPLANT
PAD PRO RADIOLUCENT 2001M-C (PAD) ×2 IMPLANT
PATCH CARTO3 (PAD) ×1 IMPLANT
PENTARAY F 2-6-2MM (CATHETERS) ×2
SHEATH AGILIS NXT 8.5F 71CM (SHEATH) ×1 IMPLANT
SHEATH AVANTI 11F 11CM (SHEATH) ×1 IMPLANT
SHEATH BAYLIS TRANSSEPTAL 98CM (NEEDLE) ×1 IMPLANT
SHEATH PINNACLE 7F 10CM (SHEATH) ×2 IMPLANT
SHEATH PINNACLE 8F 10CM (SHEATH) ×1 IMPLANT
SHEATH PINNACLE 9F 10CM (SHEATH) ×1 IMPLANT
SHIELD RADPAD SCOOP 12X17 (MISCELLANEOUS) ×2 IMPLANT
TUBING SMART ABLATE COOLFLOW (TUBING) ×1 IMPLANT

## 2018-06-04 NOTE — Progress Notes (Signed)
On admission to unit right groin site dressing saturated. [name] held pressure for 20 minutes and reapplied gauze dressing secured with tegaderm. No further bleeding noted with frequent checks remainder of shift. Bedrest started at 1730.
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
## 2018-06-04 NOTE — Anesthesia Procedure Notes (Signed)
Procedure Name: Intubation Date/Time: 06/04/2018 11:04 AM Performed by: Moshe Salisbury, CRNA Pre-anesthesia Checklist: Patient identified, Emergency Drugs available, Suction available and Patient being monitored Patient Re-evaluated:Patient Re-evaluated prior to induction Oxygen Delivery Method: Circle System Utilized Preoxygenation: Pre-oxygenation with 100% oxygen Induction Type: IV induction Ventilation: Mask ventilation without difficulty Laryngoscope Size: Mac and 4 Grade View: Grade II Tube type: Oral Tube size: 8.0 mm Number of attempts: 1 Airway Equipment and Method: Stylet Placement Confirmation: ETT inserted through vocal cords under direct vision,  positive ETCO2 and breath sounds checked- equal and bilateral Secured at: 22 cm Tube secured with: Tape Dental Injury: Teeth and Oropharynx as per pre-operative assessment

## 2018-06-04 NOTE — Transfer of Care (Signed)
Immediate Anesthesia Transfer of Care Note  Patient: [name]  Procedure(s) Performed: Stephanie Coup ABLATION (N/A )  Patient Location: Cath Lab  Anesthesia Type:General  Level of Consciousness: drowsy and patient cooperative  Airway & Oxygen Therapy: Patient Spontanous Breathing and Patient connected to nasal cannula oxygen  Post-op Assessment: Report given to RN, Post -op Vital signs reviewed and stable and Patient moving all extremities  Post vital signs: Reviewed and stable  Last Vitals:  Vitals Value Taken Time  BP    Temp    Pulse    Resp    SpO2      Last Pain:  Vitals:   06/04/18 0823  TempSrc:   PainSc: 0-No pain      Patients Stated Pain Goal: 2 (98/72/15 8727)  Complications: No apparent anesthesia complications

## 2018-06-04 NOTE — Anesthesia Preprocedure Evaluation (Signed)
Anesthesia Evaluation  Patient identified by MRN, date of birth, ID band Patient awake    Reviewed: Allergy & Precautions, NPO status , Patient's Chart, lab work & pertinent test results  History of Anesthesia Complications (+) history of anesthetic complications  Airway Mallampati: II  TM Distance: >3 FB Neck ROM: Full    Dental  (+) Edentulous Upper, Edentulous Lower   Pulmonary sleep apnea , COPD,  COPD inhaler, former smoker,     + decreased breath sounds      Cardiovascular hypertension, Pt. on home beta blockers + CAD, + Past MI, + Peripheral Vascular Disease and +CHF  + dysrhythmias Atrial Fibrillation and Ventricular Tachycardia + pacemaker + Cardiac Defibrillator  Rhythm:Regular Rate:Normal     Neuro/Psych  Headaches, Seizures -,  PSYCHIATRIC DISORDERS Depression Vocal Cord paralysis  Neuromuscular disease CVA, Residual Symptoms    GI/Hepatic GERD  ,  Endo/Other  diabetes, Insulin Dependent  Renal/GU CRFRenal disease     Musculoskeletal  (+) Arthritis ,   Abdominal Normal abdominal exam  (+)   Peds  Hematology   Anesthesia Other Findings  - Left ventricle: The cavity size was moderately dilated. Systolic   function was severely reduced. The estimated ejection fraction   was in the range of 20% to 25%. Diffuse hypokinesis. Doppler   parameters are consistent with abnormal left ventricular   relaxation (grade 1 diastolic dysfunction). Doppler parameters   are consistent with elevated ventricular end-diastolic filling   pressure. - Ventricular septum: Septal motion showed paradox. - Mitral valve: There was moderate regurgitation directed   posteriorly. - Left atrium: The atrium was mildly dilated. - Right ventricle: Systolic function was normal. - Tricuspid valve: There was trivial regurgitation. - Pulmonary arteries: Systolic pressure was within the normal   range. - Pericardium, extracardiac: There  was no pericardial effusion.  Reproductive/Obstetrics                             Anesthesia Physical Anesthesia Plan  ASA: III  Anesthesia Plan: General   Post-op Pain Management:    Induction: Intravenous  PONV Risk Score and Plan: 2 and Ondansetron and Dexamethasone  Airway Management Planned: Oral ETT and LMA  Additional Equipment: None  Intra-op Plan:   Post-operative Plan: Extubation in OR  Informed Consent: I have reviewed the patients History and Physical, chart, labs and discussed the procedure including the risks, benefits and alternatives for the proposed anesthesia with the patient or authorized representative who has indicated his/her understanding and acceptance.     Dental advisory given  Plan Discussed with: CRNA and Surgeon  Anesthesia Plan Comments:         Anesthesia Quick Evaluation

## 2018-06-04 NOTE — CV Procedure (Signed)
    7, 8, and 11 Fr sheats were removed manually from the R FV, and the pressure was held for 25 min. The R groin is soft and non tender. Procedure was tolerated well, and bed rest instructions were give to the patient. HR  - 62 SR BP - 139/74 sPO2 - 97 on R/A

## 2018-06-05 ENCOUNTER — Ambulatory Visit: Payer: Medicare Other | Admitting: Adult Health

## 2018-06-05 ENCOUNTER — Encounter (HOSPITAL_COMMUNITY): Payer: Self-pay | Admitting: Internal Medicine

## 2018-06-05 DIAGNOSIS — I130 Hypertensive heart and chronic kidney disease with heart failure and stage 1 through stage 4 chronic kidney disease, or unspecified chronic kidney disease: Secondary | ICD-10-CM | POA: Diagnosis not present

## 2018-06-05 DIAGNOSIS — I509 Heart failure, unspecified: Secondary | ICD-10-CM | POA: Diagnosis not present

## 2018-06-05 DIAGNOSIS — I472 Ventricular tachycardia: Secondary | ICD-10-CM

## 2018-06-05 DIAGNOSIS — I480 Paroxysmal atrial fibrillation: Secondary | ICD-10-CM | POA: Diagnosis not present

## 2018-06-05 LAB — BASIC METABOLIC PANEL
Anion gap: 10 (ref 5–15)
BUN: 18 mg/dL (ref 8–23)
CHLORIDE: 105 mmol/L (ref 98–111)
CO2: 25 mmol/L (ref 22–32)
Calcium: 8.9 mg/dL (ref 8.9–10.3)
Creatinine, Ser: 1.5 mg/dL — ABNORMAL HIGH (ref 0.61–1.24)
GFR calc Af Amer: 54 mL/min — ABNORMAL LOW (ref >60)
GFR calc non Af Amer: 46 mL/min — ABNORMAL LOW (ref >60)
Glucose, Bld: 127 mg/dL — ABNORMAL HIGH (ref 70–99)
Potassium: 4.2 mmol/L (ref 3.5–5.1)
Sodium: 140 mmol/L (ref 135–145)

## 2018-06-05 LAB — GLUCOSE, CAPILLARY: Glucose-Capillary: 112 mg/dL — ABNORMAL HIGH (ref 70–99)

## 2018-06-05 LAB — MAGNESIUM: Magnesium: 1.9 mg/dL (ref 1.7–2.4)

## 2018-06-05 NOTE — Progress Notes (Signed)
Wasted Vimpat 25 mg po in [employer], witnessed by Marton Redwood.  Patient discharged and removed from pyxis and unable to chart through pyxis

## 2018-06-05 NOTE — Discharge Instructions (Addendum)
Post procedure care instuctions (The patient no longer drives) No lifting over 5 lbs for 1 week. No vigorous or sexual activity for 1 week. You may return to work on 06/12/2018. Keep procedure site clean & dry. If you notice increased pain, swelling, bleeding or pus, call/return!  You may shower, but no soaking baths/hot tubs/pools for 1 week.

## 2018-06-05 NOTE — Discharge Summary (Addendum)
ELECTROPHYSIOLOGY PROCEDURE DISCHARGE SUMMARY    [name],  MRN: [id number], DOB/AGE: 1946-01-25 73 y.o.  Admit date: 06/04/2018 Discharge date: 06/05/2018  Primary Care Physician: Dorothyann Peng, NP  Primary Cardiologist/AHF: Dr. Haroldine Laws Electrophysiologist: Dr. Rayann Heman  Primary Discharge Diagnosis:  1. VT  Secondary Discharge Diagnosis:  1. ICM w/ICD (BSCi) 2. CAD 3. 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 Paroxysmal AFib     CHA2DS2Vasc is 5, on Eliquis, appropriately dosed 5. CKD (III)  Allergies  Allergen Reactions  . Aricept [Donepezil Hcl] Other (See Comments)    Worsens renal function  . Codeine Hives  . Nexium [Esomeprazole] Other (See Comments)    Severely worsens renal function  . Omeprazole Hives  . Pantoprazole Sodium Other (See Comments)    Renal failure  . Tramadol Nausea And Vomiting     Procedures This Admission: 1.  Electrophysiology study and radiofrequency catheter ablation on 06/04/2018 by Dr Rayann Heman.   This study demonstrated  CONCLUSIONS: 1. Sinus rhythm upon presentation   2. Multiple VT morphologies observed today, arising from the inferolateral left ventricle along the area of scar demonstrated with voltage mapping. 3. Extensive substrate modification with radiofrequency ablation performed along the inferior and inferolateral portions of the LV scar. 4. No early apparent complications.  Brief HPI: [name] is a 73 y.o. male with a past medical history as outlined above.  He has had prior VT ablation with recurrent VT episodes, shocks.   Risks, benefits, and alternatives to ablation were reviewed with the patient who wished to proceed.   Hospital Course:  The patient was admitted and underwent EPS/RFCA with details as outlined above. He was monitored on telemetry overnight which demonstrated SR, intermittent A pacing, infrequent PVCs.  R groin, procedure site is without complication.  The patient feels well this morning, no site pain, no  CP or SOB, he was examined by Dr Rayann Heman who considered him stable for discharge to home.  Follow up is in place.  Wound care and restrictions were reviewed with the patient prior to discharge.   Physical Exam: Vitals:   06/04/18 1728 06/04/18 2005 06/05/18 0409 06/05/18 0700  BP: (!) 142/79 115/74 120/66 (!) 105/48  Pulse: 64 70 63 66  Resp: 16 16 15  (!) 22  Temp: 98.5 F (36.9 C) 98.2 F (36.8 C) 97.6 F (36.4 C) 98.2 F (36.8 C)  TempSrc: Oral Oral Oral Oral  SpO2: 98% 100% 98% 96%  Weight:   87.8 kg   Height:        GEN- The patient is well appearing, alert and oriented x 3 today.   HEENT: normocephalic, atraumatic; sclera clear, conjunctiva pink; hearing intact; oropharynx clear; neck supple, no JVP Lymph- no cervical lymphadenopathy Lungs- CTA b/l normal work of breathing.  No wheezes, rales, rhonchi Heart- RRR, no murmurs, rubs or gallops, PMI not laterally displaced GI- soft, non-tender, non-distended Extremities- no clubbing, cyanosis, or edema; R DP/PT 2+ bilaterally, R groin without hematoma/bruit/bleeding MS- no significant deformity or atrophy Skin- warm and dry, no rash or lesion Psych- euthymic mood, full affect Neuro- strength and sensation are intact   Labs:   Lab Results  Component Value Date   WBC 6.8 05/27/2018   HGB 14.2 05/27/2018   HCT 42.1 05/27/2018   MCV 90 05/27/2018   PLT 277 05/27/2018    Recent Labs  Lab 06/05/18 0342  NA 140  K 4.2  CL 105  CO2 25  BUN 18  CREATININE 1.50*  CALCIUM 8.9  GLUCOSE 127*    Discharge Medications:  Allergies as of 06/05/2018      Reactions   Aricept [donepezil Hcl] Other (See Comments)   Worsens renal function   Codeine Hives   Nexium [esomeprazole] Other (See Comments)   Severely worsens renal function   Omeprazole Hives   Pantoprazole Sodium Other (See Comments)   Renal failure   Tramadol Nausea And Vomiting      Medication List    TAKE these medications   ACCU-CHEK FASTCLIX LANCETS  Misc Test blood sugar three times daily   albuterol 108 (90 Base) MCG/ACT inhaler Commonly known as:  PROVENTIL HFA;VENTOLIN HFA Inhale 1-2 puffs into the lungs every 4 (four) hours as needed for wheezing or shortness of breath (or cough).   budesonide-formoterol 160-4.5 MCG/ACT inhaler Commonly known as:  SYMBICORT Inhale 2 puffs into the lungs every 12 (twelve) hours.   ELIQUIS 5 MG Tabs tablet Generic drug:  apixaban TAKE ONE TABLET BY MOUTH TWICE DAILY What changed:  how much to take   ENTRESTO 97-103 MG Generic drug:  sacubitril-valsartan TAKE 1 TABLET BY MOUTH 2 (TWO) TIMES DAILY. What changed:  See the new instructions.   fenofibrate 160 MG tablet Take 1 tablet (160 mg total) by mouth every evening. What changed:  when to take this   finasteride 5 MG tablet Commonly known as:  PROSCAR TAKE ONE TABLET BY MOUTH ONCE DAILY   furosemide 20 MG tablet Commonly known as:  LASIX TAKE ONE TABLET BY MOUTH THREE TIMES A WEEK ( [place]) What changed:  See the new instructions.   glucose blood test strip Commonly known as:  ACCU-CHEK GUIDE 1 each by Other route 3 (three) times daily. Use as instructed   insulin glargine 100 unit/mL Sopn Commonly known as:  LANTUS Inject 7 Units into the skin at bedtime.   Insulin Pen Needle 31G X 5 MM Misc Use daily at bedtime.   isosorbide-hydrALAZINE 20-37.5 MG tablet Commonly known as:  BIDIL Take 2 tablets by mouth 2 (two) times daily.   lacosamide 50 MG Tabs tablet Commonly known as:  VIMPAT Take 1.5 tablets in AM, 2 tablets in PM What changed:    how much to take  how to take this  when to take this  additional instructions   levocetirizine 5 MG tablet Commonly known as:  XYZAL Take 1 tablet (5 mg total) by mouth every evening.   metoprolol succinate 50 MG 24 hr tablet Commonly known as:  TOPROL-XL Take 25-50 mg by mouth See admin instructions. Take 25 mg in the AM and 50 mg in the PM    mirabegron ER 25 MG Tb24 tablet Commonly known as:  MYRBETRIQ Take 25 mg by mouth daily.   nitroGLYCERIN 0.4 MG SL tablet Commonly known as:  NITROSTAT Place 1 tablet (0.4 mg total) under the tongue every 5 (five) minutes x 3 doses as needed for chest pain.   ranolazine 500 MG 12 hr tablet Commonly known as:  RANEXA Take 1 tablet (500 mg total) by mouth daily. What changed:  when to take this   rosuvastatin 40 MG tablet Commonly known as:  CRESTOR TAKE ONE TABLET BY MOUTH EVERY DAY IN THE EVENING   sotalol 80 MG tablet Commonly known as:  BETAPACE Take 1 tablet (80 mg total) by mouth daily. What changed:  when to take this   SYSTANE 0.4-0.3 % Soln Generic drug:  Polyethyl Glycol-Propyl Glycol Place 1 drop into both eyes daily as needed (  for dry eyes).   tamsulosin 0.4 MG Caps capsule Commonly known as:  FLOMAX Take 0.4 mg by mouth at bedtime.       Disposition: Home  Discharge Instructions    Diet - low sodium heart healthy   Complete by:  As directed    Increase activity slowly   Complete by:  As directed      Follow-up Information    [name], MD Follow up.   Specialty:  Cardiology Why:  07/03/2018 @ 10:00AM Contact information: [place] [address] [phone]           Duration of Discharge Encounter: Greater than 30 minutes including physician time.  Signed, Tommye Standard, PA-C 06/05/2018 9:25 AM    I have seen, examined the patient, and reviewed the above assessment and plan.  Changes to above are made where necessary.  On exam, RRR.  Doing well post ablation.  Resume home medicines.  Routine wound care and follow-up.  Co Sign: [name], MD 06/05/2018 8:34 PM

## 2018-06-06 NOTE — Anesthesia Postprocedure Evaluation (Signed)
Anesthesia Post Note  Patient: [name]  Procedure(s) Performed: Stephanie Coup ABLATION (N/A )     Patient location during evaluation: Cath Lab Anesthesia Type: General Level of consciousness: awake and alert Pain management: pain level controlled Vital Signs Assessment: post-procedure vital signs reviewed and stable Respiratory status: spontaneous breathing, nonlabored ventilation, respiratory function stable and patient connected to nasal cannula oxygen Cardiovascular status: blood pressure returned to baseline and stable Postop Assessment: no apparent nausea or vomiting Anesthetic complications: no    Last Vitals:  Vitals:   06/05/18 0409 06/05/18 0700  BP: 120/66 (!) 105/48  Pulse: 63 66  Resp: 15 (!) 22  Temp: 36.4 C 36.8 C  SpO2: 98% 96%    Last Pain:  Vitals:   06/05/18 0820  TempSrc:   PainSc: 0-No pain                 Eiza Canniff

## 2018-06-07 ENCOUNTER — Ambulatory Visit: Payer: Medicare Other | Admitting: Pulmonary Disease

## 2018-06-07 LAB — CUP PACEART REMOTE DEVICE CHECK
Battery Remaining Longevity: 30 mo
Battery Remaining Percentage: 36 %
Brady Statistic RA Percent Paced: 19 %
Brady Statistic RV Percent Paced: 0 %
Date Time Interrogation Session: 20191120111800
HighPow Impedance: 51 Ohm
Implantable Lead Implant Date: 20050922
Implantable Lead Implant Date: 20050922
Implantable Lead Location: 753859
Implantable Lead Location: 753860
Implantable Lead Model: 5076
Implantable Lead Serial Number: 156891
Implantable Pulse Generator Implant Date: 20100817
Lead Channel Impedance Value: 442 Ohm
Lead Channel Impedance Value: 490 Ohm
Lead Channel Pacing Threshold Amplitude: 0.4 V
Lead Channel Pacing Threshold Amplitude: 1.1 V
Lead Channel Pacing Threshold Pulse Width: 0.4 ms
Lead Channel Pacing Threshold Pulse Width: 0.4 ms
Lead Channel Setting Pacing Amplitude: 2 V
Lead Channel Setting Pacing Amplitude: 2.4 V
Lead Channel Setting Pacing Pulse Width: 0.4 ms
Lead Channel Setting Sensing Sensitivity: 0.6 mV
Pulse Gen Serial Number: 141895

## 2018-06-12 LAB — CUP PACEART INCLINIC DEVICE CHECK
Brady Statistic RA Percent Paced: 20 %
Brady Statistic RV Percent Paced: 1 % — CL
Date Time Interrogation Session: 20200122132729
HighPow Impedance: 51 Ohm
Implantable Lead Implant Date: 20050922
Implantable Lead Implant Date: 20050922
Implantable Lead Location: 753859
Implantable Lead Location: 753860
Implantable Lead Model: 158
Implantable Lead Model: 5076
Implantable Pulse Generator Implant Date: 20100817
Lead Channel Impedance Value: 447 Ohm
Lead Channel Impedance Value: 489 Ohm
Lead Channel Pacing Threshold Amplitude: 0.5 V
Lead Channel Pacing Threshold Amplitude: 1.2 V
Lead Channel Pacing Threshold Pulse Width: 0.4 ms
Lead Channel Pacing Threshold Pulse Width: 0.4 ms
Lead Channel Sensing Intrinsic Amplitude: 21.5 mV
Lead Channel Sensing Intrinsic Amplitude: 3.5 mV
Lead Channel Setting Pacing Amplitude: 2 V
Lead Channel Setting Pacing Amplitude: 2.4 V
Lead Channel Setting Pacing Pulse Width: 0.4 ms
Lead Channel Setting Sensing Sensitivity: 0.6 mV
MDC IDC LEAD SERIAL: 156891
Pulse Gen Serial Number: 141895

## 2018-06-13 ENCOUNTER — Other Ambulatory Visit (HOSPITAL_COMMUNITY): Payer: Self-pay | Admitting: Internal Medicine

## 2018-06-13 ENCOUNTER — Encounter: Payer: Self-pay | Admitting: Adult Health

## 2018-06-13 ENCOUNTER — Telehealth: Payer: Self-pay | Admitting: Adult Health

## 2018-06-13 ENCOUNTER — Ambulatory Visit (INDEPENDENT_AMBULATORY_CARE_PROVIDER_SITE_OTHER): Payer: Medicare Other | Admitting: Adult Health

## 2018-06-13 VITALS — BP 120/62 | HR 63 | Temp 98.1°F | Wt 192.2 lb

## 2018-06-13 DIAGNOSIS — E118 Type 2 diabetes mellitus with unspecified complications: Secondary | ICD-10-CM | POA: Diagnosis not present

## 2018-06-13 DIAGNOSIS — E1165 Type 2 diabetes mellitus with hyperglycemia: Secondary | ICD-10-CM | POA: Diagnosis not present

## 2018-06-13 LAB — POCT GLYCOSYLATED HEMOGLOBIN (HGB A1C): Hemoglobin A1C: 6.5 % — AB (ref 4.0–5.6)

## 2018-06-13 MED ORDER — FREESTYLE LIBRE 14 DAY SENSOR MISC: 1.0000 | Freq: Three times a day (TID) | 3 refills | Status: DC

## 2018-06-13 MED ORDER — FREESTYLE LIBRE 14 DAY READER DEVI: 1.0000 | Freq: Once | 0 refills | Status: DC

## 2018-06-13 NOTE — Telephone Encounter (Addendum)
Copied from [place] [id number]. Topic: Quick Communication - See Telephone Encounter >> Jun 13, 2018  1:22 PM Ivar Drape wrote: CRM for notification. See Telephone encounter for: 06/13/18. Eustace Pen w/[employer] Member Svcs Dept [phone] stated that the [facility] equipment that the patient wanted to check blood sugars, is not covered by [facility].  He suggests calling the provider line/prior authorization dept [phone] to have it covered for the member, or supply an alternative machine that is covered by the plan.

## 2018-06-13 NOTE — Telephone Encounter (Signed)
Copied from [place] [phone]. Topic: Quick Communication - See Telephone Encounter >> Jun 13, 2018  1:39 PM Bea Graff, NT wrote: CRM for notification. See Telephone encounter for: 06/13/18. Pts wife states the medicine Continuous Blood Gluc Sensor (FREESTYLE LIBRE 14 DAY SENSOR) MISC requires PA. She would like to see if he should use the insulin that he has been using until this is approved?
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
## 2018-06-14 NOTE — Telephone Encounter (Signed)
I called Mrs Gauthreaux and informed her of the message below.  Message sent to [place] for PA.

## 2018-06-14 NOTE — Telephone Encounter (Signed)
Yes, continue with current treatment

## 2018-06-18 NOTE — Telephone Encounter (Signed)
Caleb Rumps, do you feel it safe to go smaller on the needle.  Currently using 31 X 5.  Please advise.

## 2018-06-18 NOTE — Telephone Encounter (Signed)
[name] needs to be notified to contact the insurance company to find out which diabetic machine and supplies are covered.  The Freestyle [name] is not covered under the insurance.  [facility] agent may tell her/pt to contact the insurance company.
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
## 2018-06-18 NOTE — Telephone Encounter (Signed)
PA has been sent to Cover My Meds.  Key: [id number] - PA Case ID: [id number]

## 2018-06-18 NOTE — Telephone Encounter (Signed)
Pt's wife called back and said when she gets home she is going to see if he has one more refill on the lantus. She also would like to know could he have a smaller needle for the lantus. She will call back around 2:30-3 today

## 2018-06-19 ENCOUNTER — Ambulatory Visit (INDEPENDENT_AMBULATORY_CARE_PROVIDER_SITE_OTHER): Payer: Medicare Other | Admitting: Pulmonary Disease

## 2018-06-19 ENCOUNTER — Ambulatory Visit (INDEPENDENT_AMBULATORY_CARE_PROVIDER_SITE_OTHER): Payer: Medicare Other | Admitting: Internal Medicine

## 2018-06-19 ENCOUNTER — Encounter: Payer: Self-pay | Admitting: Pulmonary Disease

## 2018-06-19 VITALS — BP 118/56 | HR 71 | Ht 69.0 in | Wt 189.8 lb

## 2018-06-19 DIAGNOSIS — J438 Other emphysema: Secondary | ICD-10-CM

## 2018-06-19 DIAGNOSIS — J849 Interstitial pulmonary disease, unspecified: Secondary | ICD-10-CM

## 2018-06-19 DIAGNOSIS — G4733 Obstructive sleep apnea (adult) (pediatric): Secondary | ICD-10-CM | POA: Diagnosis not present

## 2018-06-19 LAB — PULMONARY FUNCTION TEST
DL/VA % pred: 85 %
DL/VA: 3.9 ml/min/mmHg/L
DLCO COR: 22 ml/min/mmHg
DLCO cor % pred: 70 %
DLCO unc % pred: 70 %
DLCO unc: 21.75 ml/min/mmHg
FEF 25-75 Post: 2.11 L/sec
FEF 25-75 Pre: 2.36 L/sec
FEF2575-%Change-Post: -10 %
FEF2575-%Pred-Post: 89 %
FEF2575-%Pred-Pre: 100 %
FEV1-%Change-Post: -3 %
FEV1-%Pred-Post: 95 %
FEV1-%Pred-Pre: 99 %
FEV1-POST: 2.64 L
FEV1-Pre: 2.75 L
FEV1FVC-%Change-Post: -4 %
FEV1FVC-%Pred-Pre: 98 %
FEV6-%Change-Post: 3 %
FEV6-%PRED-PRE: 101 %
FEV6-%Pred-Post: 105 %
FEV6-POST: 3.68 L
FEV6-Pre: 3.54 L
FEV6FVC-%PRED-POST: 105 %
FEV6FVC-%Pred-Pre: 105 %
FVC-%Change-Post: 0 %
FVC-%PRED-POST: 100 %
FVC-%Pred-Pre: 99 %
FVC-Post: 3.68 L
FVC-Pre: 3.66 L
Post FEV1/FVC ratio: 72 %
Post FEV6/FVC ratio: 100 %
Pre FEV1/FVC ratio: 75 %
Pre FEV6/FVC Ratio: 100 %

## 2018-06-19 NOTE — Telephone Encounter (Signed)
Spoke to [name] and advised that [name] does not want to go smaller on insulin pen needle.  She has not called the insurance company to find out what diabetic machine and supplies will be covered.  She will call back.  Nothing further needed at this time.

## 2018-06-19 NOTE — Patient Instructions (Signed)
Continue Symbicort 160 >>> 2 puffs in the morning right when you wake up, rinse out your mouth after use, 12 hours later 2 puffs, rinse after use >>> Take this daily, no matter what >>> This is not a rescue inhaler   Only use your albuterol as a rescue medication to be used if you can't catch your breath by resting or doing a relaxed purse lip breathing pattern.  - The less you use it, the better it will work when you need it. - Ok to use up to 2 puffs  every 4 hours if you must but call for immediate appointment if use goes up over your usual need - Don't leave home without it !!  (think of it like the spare tire for your car)    Continue Xyzal daily   We recommend that you continue using your CPAP daily >>>Keep up the hard work using your device >>> Goal should be wearing this for the entire night that you are sleeping, at least 4 to 6 hours  Remember:  . Do not drive or operate heavy machinery if tired or drowsy.  . Please notify the supply company and office if you are unable to use your device regularly due to missing supplies or machine being broken.  . Work on maintaining a healthy weight and following your recommended nutrition plan  . Maintain proper daily exercise and movement  . Maintaining proper use of your device can also help improve management of other chronic illnesses such as: Blood pressure, blood sugars, and weight management.   BiPAP/ CPAP Cleaning:  >>>Clean weekly, with Dawn soap, and bottle brush.  Set up to air dry.     Follow-up with Dr. Chase Caller in 3 to 6 months in ILD clinic   It is flu season:   >>>Remember to be washing your hands regularly, using hand sanitizer, be careful to use around herself with has contact with people who are sick will increase her chances of getting sick yourself. >>> Best ways to protect herself from the flu: Receive the yearly flu vaccine, practice good hand hygiene washing with soap and also using hand sanitizer when  available, eat a nutritious meals, get adequate rest, hydrate appropriately   Please contact the office if your symptoms worsen or you have concerns that you are not improving.   Thank you for choosing [facility] for your healthcare, and for allowing [place] to partner with you on your healthcare journey. I am thankful to be able to provide care to you today.   [name] FNP-C

## 2018-06-19 NOTE — Assessment & Plan Note (Signed)
Assessment: Former smoker BMI 28.03 Managed on Symbicort 160 Spirometry and DLCO in office today shows a DLCO of 70 COPD Gold 0 mMRC 1 Lungs clear to auscultation today  Plan: Continue Symbicort 160 Continue rescue inhaler every 4-6 hours as needed for shortness of breath or wheezing Continue Xyzal daily Continue to try to exercise daily Follow-up with Dr. Chase Caller in 3 to 6 months in ILD clinic

## 2018-06-19 NOTE — Assessment & Plan Note (Addendum)
Assessment: 2017 high-res CT showing is paraseptal emphysema and sub-subpleural bleb formation Lungs clear to auscultation today Pulmonary function test today shows a DLCO of 70  Plan: Follow-up with Dr. Chase Caller in 3 to 6 months

## 2018-06-19 NOTE — Telephone Encounter (Signed)
I would not go any smaller than that as I would worry about getting all of the insulin into the tissue

## 2018-06-19 NOTE — Assessment & Plan Note (Signed)
Assessment: BMI 28.3 CPAP compliance report shows excellent compliance Patient has no complaints or issues regarding CPAP use  Plan: Continue CPAP as planned If patient starts having issues using CPAP please follow-up with our office

## 2018-06-19 NOTE — Progress Notes (Signed)
[name] with DLCO only completed today.[name]

## 2018-06-21 MED ORDER — ONETOUCH VERIO VI STRP: ORAL_STRIP | 3 refills | Status: DC

## 2018-06-21 MED ORDER — ONETOUCH VERIO FLEX SYSTEM W/DEVICE KIT: PACK | 0 refills | Status: DC

## 2018-06-21 NOTE — Telephone Encounter (Signed)
PA has been denied stating this drug is excluded from medicare coverage and is not offered as a supplemental benefit.

## 2018-06-21 NOTE — Telephone Encounter (Signed)
Noted  

## 2018-06-21 NOTE — Addendum Note (Signed)
Addended by: Miles Costain T on: 06/21/2018 02:52 PM   Modules accepted: Orders

## 2018-06-21 NOTE — Addendum Note (Signed)
Addended by: Miles Costain T on: 06/21/2018 03:57 PM   Modules accepted: Orders

## 2018-06-21 NOTE — Telephone Encounter (Signed)
Verio Flex system sent to the pharmacy.  Please advise on Humalog.

## 2018-06-21 NOTE — Telephone Encounter (Signed)
Patient's wife called insurance and they cover Verio Flex and nothing out of pocket. They also do Humalog. She said either or is fine. Please Advise.

## 2018-06-22 NOTE — Telephone Encounter (Signed)
Is Lantus not covered by his insurance? I would like to keep him on Lantus as this is controlling his blood sugars. Humalog is a fast acting insulin and could be dangerous for him

## 2018-06-25 NOTE — Telephone Encounter (Signed)
Spoke to [name] and informed her of below.  Will stay with Lantus.  Nothing further needed.  Will pick up the Verio Flex system from the pharmacy.

## 2018-06-27 ENCOUNTER — Telehealth: Payer: Self-pay | Admitting: Adult Health

## 2018-06-27 NOTE — Telephone Encounter (Signed)
Copied from [place] [id number]. Topic: Quick Communication - Rx Refill/Question >> Jun 27, 2018  3:51 PM [name], [employer] wrote: Medication:  Freestyle Libre  Pt is getting frustrated with the meter that he is trying to use and states he will only use the [name].  Pt's daughter states they will pay for this out of pocket and wants to know if it can be called in to [facility]  Has the patient contacted their pharmacy? No - no script on file (Agent: If no, request that the patient contact the pharmacy for the refill.) (Agent: If yes, when and what did the pharmacy advise?)  Preferred Pharmacy (with phone number or street name): [name], [place] - [address] [phone] (Phone) [phone] (Fax)  Agent: Please be advised that RX refills may take up to 3 business days. We ask that you follow-up with your pharmacy.

## 2018-06-28 MED ORDER — FREESTYLE LIBRE 14 DAY SENSOR MISC: 1.0000 | 3 refills | Status: DC

## 2018-06-28 MED ORDER — FREESTYLE LIBRE 14 DAY READER DEVI: 1.0000 | 3 refills | Status: DC

## 2018-06-28 NOTE — Telephone Encounter (Signed)
Sent to the pharmacy by e-scribe. 

## 2018-07-02 ENCOUNTER — Telehealth: Payer: Self-pay | Admitting: Internal Medicine

## 2018-07-02 NOTE — Telephone Encounter (Signed)
Error. No message needed °

## 2018-07-03 ENCOUNTER — Encounter: Payer: Medicare Other | Admitting: Internal Medicine

## 2018-07-10 ENCOUNTER — Ambulatory Visit (INDEPENDENT_AMBULATORY_CARE_PROVIDER_SITE_OTHER): Payer: Medicare HMO | Admitting: Adult Health

## 2018-07-10 ENCOUNTER — Encounter: Payer: Self-pay | Admitting: Adult Health

## 2018-07-10 ENCOUNTER — Ambulatory Visit: Payer: Self-pay | Admitting: Adult Health

## 2018-07-10 ENCOUNTER — Ambulatory Visit (INDEPENDENT_AMBULATORY_CARE_PROVIDER_SITE_OTHER): Payer: Medicare HMO

## 2018-07-10 VITALS — BP 130/72 | Temp 98.1°F | Wt 190.0 lb

## 2018-07-10 DIAGNOSIS — Z20828 Contact with and (suspected) exposure to other viral communicable diseases: Secondary | ICD-10-CM | POA: Diagnosis not present

## 2018-07-10 DIAGNOSIS — R05 Cough: Secondary | ICD-10-CM | POA: Diagnosis not present

## 2018-07-10 DIAGNOSIS — I472 Ventricular tachycardia: Secondary | ICD-10-CM | POA: Diagnosis not present

## 2018-07-10 DIAGNOSIS — I255 Ischemic cardiomyopathy: Secondary | ICD-10-CM

## 2018-07-10 DIAGNOSIS — I4729 Other ventricular tachycardia: Secondary | ICD-10-CM

## 2018-07-10 DIAGNOSIS — R059 Cough, unspecified: Secondary | ICD-10-CM

## 2018-07-10 LAB — POC INFLUENZA A&B (BINAX/QUICKVUE)
Influenza A, POC: NEGATIVE
Influenza B, POC: NEGATIVE
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
## 2018-07-11 ENCOUNTER — Ambulatory Visit: Payer: Self-pay | Admitting: Adult Health

## 2018-07-11 LAB — CUP PACEART REMOTE DEVICE CHECK
Battery Remaining Longevity: 30 mo
Battery Remaining Percentage: 33 %
Brady Statistic RA Percent Paced: 30 %
Brady Statistic RV Percent Paced: 0 %
Date Time Interrogation Session: 20200219092200
HIGH POWER IMPEDANCE MEASURED VALUE: 56 Ohm
Implantable Lead Implant Date: 20050922
Implantable Lead Location: 753859
Implantable Lead Location: 753860
Implantable Lead Model: 158
Implantable Lead Model: 5076
Implantable Lead Serial Number: 156891
Implantable Pulse Generator Implant Date: 20100817
Lead Channel Impedance Value: 467 Ohm
Lead Channel Impedance Value: 497 Ohm
Lead Channel Pacing Threshold Amplitude: 0.6 V
Lead Channel Pacing Threshold Amplitude: 1 V
Lead Channel Pacing Threshold Pulse Width: 0.4 ms
Lead Channel Setting Pacing Amplitude: 2 V
Lead Channel Setting Pacing Amplitude: 2.4 V
Lead Channel Setting Pacing Pulse Width: 0.4 ms
Lead Channel Setting Sensing Sensitivity: 0.6 mV
MDC IDC LEAD IMPLANT DT: 20050922
MDC IDC MSMT LEADCHNL RA PACING THRESHOLD PULSEWIDTH: 0.4 ms
Pulse Gen Serial Number: 141895

## 2018-07-16 ENCOUNTER — Encounter (HOSPITAL_COMMUNITY): Payer: Self-pay | Admitting: Internal Medicine

## 2018-07-16 ENCOUNTER — Ambulatory Visit (HOSPITAL_COMMUNITY)
Admission: RE | Admit: 2018-07-16 | Discharge: 2018-07-16 | Disposition: A | Discharge status: Home / Self Care | Payer: Medicare HMO | Source: Ambulatory Visit | Attending: Internal Medicine | Admitting: Internal Medicine

## 2018-07-16 ENCOUNTER — Other Ambulatory Visit: Payer: Self-pay

## 2018-07-16 VITALS — BP 122/74 | HR 67 | Wt 192.0 lb

## 2018-07-16 DIAGNOSIS — Z79899 Other long term (current) drug therapy: Secondary | ICD-10-CM | POA: Diagnosis not present

## 2018-07-16 DIAGNOSIS — Z885 Allergy status to narcotic agent status: Secondary | ICD-10-CM | POA: Diagnosis not present

## 2018-07-16 DIAGNOSIS — I472 Ventricular tachycardia, unspecified: Secondary | ICD-10-CM

## 2018-07-16 DIAGNOSIS — J449 Chronic obstructive pulmonary disease, unspecified: Secondary | ICD-10-CM | POA: Insufficient documentation

## 2018-07-16 DIAGNOSIS — E1022 Type 1 diabetes mellitus with diabetic chronic kidney disease: Secondary | ICD-10-CM | POA: Insufficient documentation

## 2018-07-16 DIAGNOSIS — Z888 Allergy status to other drugs, medicaments and biological substances status: Secondary | ICD-10-CM | POA: Diagnosis not present

## 2018-07-16 DIAGNOSIS — Z8349 Family history of other endocrine, nutritional and metabolic diseases: Secondary | ICD-10-CM | POA: Insufficient documentation

## 2018-07-16 DIAGNOSIS — Z836 Family history of other diseases of the respiratory system: Secondary | ICD-10-CM | POA: Insufficient documentation

## 2018-07-16 DIAGNOSIS — Z9889 Other specified postprocedural states: Secondary | ICD-10-CM | POA: Diagnosis not present

## 2018-07-16 DIAGNOSIS — Z8249 Family history of ischemic heart disease and other diseases of the circulatory system: Secondary | ICD-10-CM | POA: Insufficient documentation

## 2018-07-16 DIAGNOSIS — G4733 Obstructive sleep apnea (adult) (pediatric): Secondary | ICD-10-CM | POA: Insufficient documentation

## 2018-07-16 DIAGNOSIS — Z886 Allergy status to analgesic agent status: Secondary | ICD-10-CM | POA: Insufficient documentation

## 2018-07-16 DIAGNOSIS — E785 Hyperlipidemia, unspecified: Secondary | ICD-10-CM | POA: Insufficient documentation

## 2018-07-16 DIAGNOSIS — I130 Hypertensive heart and chronic kidney disease with heart failure and stage 1 through stage 4 chronic kidney disease, or unspecified chronic kidney disease: Secondary | ICD-10-CM | POA: Insufficient documentation

## 2018-07-16 DIAGNOSIS — I5022 Chronic systolic (congestive) heart failure: Secondary | ICD-10-CM | POA: Diagnosis present

## 2018-07-16 DIAGNOSIS — I480 Paroxysmal atrial fibrillation: Secondary | ICD-10-CM | POA: Insufficient documentation

## 2018-07-16 DIAGNOSIS — I252 Old myocardial infarction: Secondary | ICD-10-CM | POA: Insufficient documentation

## 2018-07-16 DIAGNOSIS — Z794 Long term (current) use of insulin: Secondary | ICD-10-CM | POA: Insufficient documentation

## 2018-07-16 DIAGNOSIS — I255 Ischemic cardiomyopathy: Secondary | ICD-10-CM | POA: Diagnosis not present

## 2018-07-16 DIAGNOSIS — N183 Chronic kidney disease, stage 3 (moderate): Secondary | ICD-10-CM | POA: Diagnosis not present

## 2018-07-16 DIAGNOSIS — I2510 Atherosclerotic heart disease of native coronary artery without angina pectoris: Secondary | ICD-10-CM | POA: Insufficient documentation

## 2018-07-16 DIAGNOSIS — Z95810 Presence of automatic (implantable) cardiac defibrillator: Secondary | ICD-10-CM | POA: Insufficient documentation

## 2018-07-16 DIAGNOSIS — Z833 Family history of diabetes mellitus: Secondary | ICD-10-CM | POA: Insufficient documentation

## 2018-07-16 DIAGNOSIS — K219 Gastro-esophageal reflux disease without esophagitis: Secondary | ICD-10-CM | POA: Diagnosis not present

## 2018-07-16 DIAGNOSIS — Z7901 Long term (current) use of anticoagulants: Secondary | ICD-10-CM | POA: Insufficient documentation

## 2018-07-16 DIAGNOSIS — Z87891 Personal history of nicotine dependence: Secondary | ICD-10-CM | POA: Insufficient documentation

## 2018-07-16 DIAGNOSIS — Z883 Allergy status to other anti-infective agents status: Secondary | ICD-10-CM | POA: Insufficient documentation

## 2018-07-16 NOTE — Patient Instructions (Signed)
No medication changes today!!  Do the following things EVERYDAY: 1) Weigh yourself in the morning before breakfast. Write it down and keep it in a log. 2) Take your medicines as prescribed 3) Eat low salt foods-Limit salt (sodium) to 2000 mg per day.  4) Stay as active as you can everyday 5) Limit all fluids for the day to less than 2 liters  Your physician has requested that you have an echocardiogram. Echocardiography is a painless test that uses sound waves to create images of your heart. It provides your doctor with information about the size and shape of your heart and how well your heart's chambers and valves are working. This procedure takes approximately one hour. There are no restrictions for this procedure.  Your physician recommends that you schedule a follow-up appointment in: 6 months with Dr. Haroldine Laws with an ECHO, we will call you to schedule this appointment.
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
## 2018-07-18 ENCOUNTER — Ambulatory Visit (INDEPENDENT_AMBULATORY_CARE_PROVIDER_SITE_OTHER): Payer: Medicare HMO | Admitting: Adult Health

## 2018-07-18 ENCOUNTER — Other Ambulatory Visit: Payer: Self-pay | Admitting: Adult Health

## 2018-07-18 ENCOUNTER — Encounter: Payer: Self-pay | Admitting: Adult Health

## 2018-07-18 VITALS — BP 120/64 | Temp 98.1°F | Ht 71.0 in | Wt 192.0 lb

## 2018-07-18 DIAGNOSIS — I5022 Chronic systolic (congestive) heart failure: Secondary | ICD-10-CM | POA: Diagnosis not present

## 2018-07-18 DIAGNOSIS — I472 Ventricular tachycardia, unspecified: Secondary | ICD-10-CM

## 2018-07-18 DIAGNOSIS — N400 Enlarged prostate without lower urinary tract symptoms: Secondary | ICD-10-CM | POA: Diagnosis not present

## 2018-07-18 DIAGNOSIS — N183 Chronic kidney disease, stage 3 unspecified: Secondary | ICD-10-CM

## 2018-07-18 DIAGNOSIS — G40909 Epilepsy, unspecified, not intractable, without status epilepticus: Secondary | ICD-10-CM

## 2018-07-18 DIAGNOSIS — E118 Type 2 diabetes mellitus with unspecified complications: Secondary | ICD-10-CM | POA: Diagnosis not present

## 2018-07-18 DIAGNOSIS — Z794 Long term (current) use of insulin: Secondary | ICD-10-CM

## 2018-07-18 DIAGNOSIS — E782 Mixed hyperlipidemia: Secondary | ICD-10-CM

## 2018-07-18 DIAGNOSIS — Z1159 Encounter for screening for other viral diseases: Secondary | ICD-10-CM

## 2018-07-18 DIAGNOSIS — Z0000 Encounter for general adult medical examination without abnormal findings: Secondary | ICD-10-CM

## 2018-07-18 DIAGNOSIS — I10 Essential (primary) hypertension: Secondary | ICD-10-CM | POA: Diagnosis not present

## 2018-07-18 DIAGNOSIS — J439 Emphysema, unspecified: Secondary | ICD-10-CM

## 2018-07-18 LAB — CBC WITH DIFFERENTIAL/PLATELET
Basophils Absolute: 0.1 10*3/uL (ref 0.0–0.1)
Basophils Relative: 1 % (ref 0.0–3.0)
Eosinophils Absolute: 0.1 10*3/uL (ref 0.0–0.7)
Eosinophils Relative: 2.6 % (ref 0.0–5.0)
HEMATOCRIT: 39.8 % (ref 39.0–52.0)
Hemoglobin: 13.9 g/dL (ref 13.0–17.0)
Lymphocytes Relative: 38.1 % (ref 12.0–46.0)
Lymphs Abs: 2.1 10*3/uL (ref 0.7–4.0)
MCHC: 34.9 g/dL (ref 30.0–36.0)
MCV: 90 fl (ref 78.0–100.0)
Monocytes Absolute: 0.5 10*3/uL (ref 0.1–1.0)
Monocytes Relative: 10 % (ref 3.0–12.0)
Neutro Abs: 2.6 10*3/uL (ref 1.4–7.7)
Neutrophils Relative %: 48.3 % (ref 43.0–77.0)
PLATELETS: 213 10*3/uL (ref 150.0–400.0)
RBC: 4.43 Mil/uL (ref 4.22–5.81)
RDW: 13.7 % (ref 11.5–15.5)
WBC: 5.4 10*3/uL (ref 4.0–10.5)

## 2018-07-18 LAB — COMPREHENSIVE METABOLIC PANEL
ALT: 15 U/L (ref 0–53)
AST: 16 U/L (ref 0–37)
Albumin: 4.3 g/dL (ref 3.5–5.2)
Alkaline Phosphatase: 50 U/L (ref 39–117)
BUN: 23 mg/dL (ref 6–23)
CALCIUM: 9.8 mg/dL (ref 8.4–10.5)
CO2: 30 mEq/L (ref 19–32)
CREATININE: 1.81 mg/dL — AB (ref 0.40–1.50)
Chloride: 104 mEq/L (ref 96–112)
GFR: 44.83 mL/min — ABNORMAL LOW (ref >60.00)
Glucose, Bld: 180 mg/dL — ABNORMAL HIGH (ref 70–99)
Potassium: 4.4 mEq/L (ref 3.5–5.1)
Sodium: 141 mEq/L (ref 135–145)
Total Bilirubin: 0.5 mg/dL (ref 0.2–1.2)
Total Protein: 6.7 g/dL (ref 6.0–8.3)

## 2018-07-18 LAB — LIPID PANEL
Cholesterol: 117 mg/dL (ref 0–200)
HDL: 29.6 mg/dL — ABNORMAL LOW (ref >39.00)
LDL Cholesterol: 65 mg/dL (ref 0–99)
NonHDL: 87.3
Total CHOL/HDL Ratio: 4
Triglycerides: 111 mg/dL (ref 0.0–149.0)
VLDL: 22.2 mg/dL (ref 0.0–40.0)

## 2018-07-18 LAB — TSH: TSH: 1.87 u[IU]/mL (ref 0.35–4.50)

## 2018-07-18 LAB — PSA: PSA: 0.46 ng/mL (ref 0.10–4.00)

## 2018-07-18 LAB — HEMOGLOBIN A1C: HEMOGLOBIN A1C: 6.9 % — AB (ref 4.6–6.5)

## 2018-07-18 MED ORDER — FREESTYLE LIBRE 14 DAY SENSOR MISC: 1.0000 | 3 refills | Status: DC

## 2018-07-18 NOTE — Telephone Encounter (Signed)
Copied from [place] [id number]. Topic: Quick Communication - Rx Refill/Question >> Jul 18, 2018  3:14 PM Blase Mess A wrote: Medication: Continuous Blood Gluc Sensor ([place]) [place] [[id number]] was sent to the wrong pharmacy  Has the patient contacted their pharmacy? Yes  (Agent: If no, request that the patient contact the pharmacy for the refill.) (Agent: If yes, when and what did the pharmacy advise?)  Preferred Pharmacy (with phone number or street name): [place], [place] [phone]  Agent: Please be advised that RX refills may take up to 3 business days. We ask that you follow-up with your pharmacy.
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
## 2018-07-18 NOTE — Progress Notes (Signed)
Remote ICD transmission.   

## 2018-07-18 NOTE — Patient Instructions (Signed)
It was great seeing you today   We will follow up with you regarding your blood work   Please continue to stay active and eat healthy   Please let me know if you need anything

## 2018-07-18 NOTE — Telephone Encounter (Signed)
Previous refill was sent to wrong pharmacy Requested Prescriptions  Pending Prescriptions Disp Refills  . Continuous Blood Gluc Sensor (FREESTYLE LIBRE 14 DAY SENSOR) MISC 24 each 3    Sig: 1 Device by Does not apply route every 14 (fourteen) days.     Endocrinology: Diabetes - Testing Supplies Passed - 07/18/2018  3:22 PM      Passed - Valid encounter within last 12 months    Recent Outpatient Visits          Today Ventricular tachycardia ([place])   Therapist, music at [facility], [place], NP   1 week ago Exposure to the flu   [employer] [facility], [place], NP   1 month ago Poorly controlled type 2 diabetes mellitus with complication ([place])   Therapist, music at [facility], [place], NP   1 month ago Poorly controlled type 2 diabetes mellitus with complication ([place])   [place] [facility], Evie Lacks, MD   3 months ago Poorly controlled type 2 diabetes mellitus with complication ([place])   Therapist, music at [facility], [place], NP      Future Appointments            In 2 months Brand Males, MD [facility]

## 2018-07-19 ENCOUNTER — Other Ambulatory Visit: Payer: Self-pay | Admitting: Family Medicine

## 2018-07-19 ENCOUNTER — Encounter: Payer: Self-pay | Admitting: Cardiology

## 2018-07-19 LAB — HEPATITIS C ANTIBODY
Hepatitis C Ab: NONREACTIVE
SIGNAL TO CUT-OFF: 0.11 (ref <1.00)

## 2018-07-19 MED ORDER — FREESTYLE LIBRE 14 DAY SENSOR MISC: 1.0000 | 3 refills | Status: DC

## 2018-07-19 MED ORDER — FREESTYLE LIBRE 14 DAY READER DEVI: 1.0000 | 3 refills | Status: DC

## 2018-07-22 ENCOUNTER — Other Ambulatory Visit (HOSPITAL_COMMUNITY): Payer: Self-pay | Admitting: Internal Medicine

## 2018-07-23 ENCOUNTER — Other Ambulatory Visit (HOSPITAL_COMMUNITY): Payer: Self-pay

## 2018-07-23 ENCOUNTER — Other Ambulatory Visit: Payer: Self-pay | Admitting: Internal Medicine

## 2018-07-23 DIAGNOSIS — I482 Chronic atrial fibrillation, unspecified: Secondary | ICD-10-CM

## 2018-07-23 MED ORDER — APIXABAN 5 MG PO TABS: 5.0000 mg | ORAL_TABLET | Freq: Two times a day (BID) | ORAL | 3 refills | Status: DC

## 2018-07-23 MED ORDER — ROSUVASTATIN CALCIUM 40 MG PO TABS: 40.0000 mg | ORAL_TABLET | Freq: Every evening | ORAL | 3 refills | Status: DC

## 2018-07-23 MED ORDER — SOTALOL HCL 80 MG PO TABS: 80.0000 mg | ORAL_TABLET | Freq: Every day | ORAL | 6 refills | Status: DC

## 2018-07-23 MED ORDER — FUROSEMIDE 20 MG PO TABS: ORAL_TABLET | ORAL | 3 refills | Status: DC

## 2018-07-23 MED ORDER — FINASTERIDE 5 MG PO TABS: 5.0000 mg | ORAL_TABLET | Freq: Every day | ORAL | 3 refills | Status: DC

## 2018-07-23 MED ORDER — SACUBITRIL-VALSARTAN 97-103 MG PO TABS: 1.0000 | ORAL_TABLET | Freq: Two times a day (BID) | ORAL | 3 refills | Status: DC

## 2018-07-23 MED ORDER — FENOFIBRATE 160 MG PO TABS: 160.0000 mg | ORAL_TABLET | Freq: Every evening | ORAL | 3 refills | Status: DC

## 2018-07-23 NOTE — Telephone Encounter (Signed)
° ° ° ° °  Pt c/o medication issue:  1. Name of Medication: ranolazine (RANEXA) 500 MG 12 hr tablet  2. How are you currently taking this medication (dosage and times per day)? As written  3. Are you having a reaction (difficulty breathing--STAT)? no  4. What is your medication issue? Patient's spouse calling to request order for 90 day supply be written going forward; more cost effective. Patient already had medication filled today with [facility]

## 2018-07-24 MED ORDER — RANOLAZINE ER 500 MG PO TB12: 500.0000 mg | ORAL_TABLET | Freq: Every day | ORAL | 2 refills | Status: DC

## 2018-07-24 NOTE — Telephone Encounter (Signed)
Pt's medication was resent in for a 90 day supply as requested. Confirmation received.

## 2018-08-02 ENCOUNTER — Telehealth: Payer: Self-pay | Admitting: Internal Medicine

## 2018-08-02 NOTE — Telephone Encounter (Signed)
Spoke with pt's husband's settings was supposed to have his pressure change in October 2019 but it was never changed according to [facility]. I spoke with Tiffany and she said they never received the order for the pressure change. He is in [place] so I printed the download but unsure on who sees him for sleep medicine. Aaron Edelman did you refer him to someone and can you review download. His wife is stating he is snoring more. Please advise.

## 2018-08-04 NOTE — Telephone Encounter (Signed)
In my last ov note it says the patient sees Dr. Annamaria Boots for sleep.   [name] FNP

## 2018-08-05 NOTE — Telephone Encounter (Signed)
I'm not sure I've ever seen this gentleman. He went from [place] to [place]. My name is on some PFT encounters. Dr Elsworth Soho read a CPAP titration in 2017 for Dr Ashok Cordia, suggesting CPAP 13. If he has an autoset machine, recommend change to auto 10-20. Then he needs an appointment to establish with a sleep doc please.

## 2018-08-05 NOTE — Telephone Encounter (Signed)
Left message for patient's wife to call back.  

## 2018-08-05 NOTE — Telephone Encounter (Signed)
Dr. Annamaria Boots can you please advise on this and what the settings need to be thank you.

## 2018-08-06 ENCOUNTER — Telehealth: Payer: Self-pay | Admitting: Internal Medicine

## 2018-08-06 ENCOUNTER — Ambulatory Visit: Payer: Medicare HMO | Admitting: Nurse Practitioner

## 2018-08-06 LAB — BASIC METABOLIC PANEL
BUN: 18 (ref 4–21)
Creatinine: 1.6 — AB (ref 0.6–1.3)
Glucose: 135
Potassium: 4.6 (ref 3.4–5.3)
Sodium: 139 (ref 137–147)

## 2018-08-06 LAB — CBC AND DIFFERENTIAL: Hemoglobin: 13.6 (ref 13.5–17.5)
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
## 2018-08-06 NOTE — Telephone Encounter (Signed)
Pt had VT ablation 06-04-18 with Allred. He was scheduled for his 4 week fu 07-03-18 with Allred. He called to RS and was moved 5 more weeks out to [facility] [employer]. Pt called again today to RS and was moved to 08-27-18 again with Cecille Rubin. I have cancelled this. Pls call pt [phone] to fu on his condition post ablation.

## 2018-08-06 NOTE — Telephone Encounter (Signed)
ATC Patient.  Left message to call back. 

## 2018-08-07 ENCOUNTER — Other Ambulatory Visit: Payer: Self-pay | Admitting: Internal Medicine

## 2018-08-07 NOTE — Telephone Encounter (Signed)
LMTCB

## 2018-08-08 NOTE — Telephone Encounter (Signed)
Called and spoke with pt's wife [name] stating the info per CY. [name] expressed understanding and stated to schedule pt a sleep consult. Pt has been scheduled sleep consult with AO Tues. 3/24 at 2:45. Nothing further needed.

## 2018-08-08 NOTE — Telephone Encounter (Signed)
ATC Patient.  LMTCB.  Per CY- I'm not sure I've ever seen this gentleman. He went from [place] to [place]. My name is on some PFT encounters. Dr Elsworth Soho read a CPAP titration in 2017 for Dr Ashok Cordia, suggesting CPAP 13. If he has an autoset machine, recommend change to auto 10-20. Then he needs an appointment to establish with a sleep doc please.

## 2018-08-08 NOTE — Telephone Encounter (Signed)
Patient's wife returning phone call.  Phone number is [phone].

## 2018-08-12 ENCOUNTER — Telehealth: Payer: Self-pay | Admitting: Internal Medicine

## 2018-08-13 ENCOUNTER — Other Ambulatory Visit: Payer: Self-pay | Admitting: Family Medicine

## 2018-08-13 ENCOUNTER — Ambulatory Visit (INDEPENDENT_AMBULATORY_CARE_PROVIDER_SITE_OTHER): Payer: Medicare HMO | Admitting: Pulmonary Disease

## 2018-08-13 ENCOUNTER — Encounter: Payer: Self-pay | Admitting: Pulmonary Disease

## 2018-08-13 ENCOUNTER — Other Ambulatory Visit: Payer: Self-pay

## 2018-08-13 ENCOUNTER — Other Ambulatory Visit: Payer: Self-pay | Admitting: Physician Assistant

## 2018-08-13 VITALS — BP 110/80 | HR 72 | Ht 71.0 in | Wt 190.0 lb

## 2018-08-13 DIAGNOSIS — Z9989 Dependence on other enabling machines and devices: Secondary | ICD-10-CM | POA: Diagnosis not present

## 2018-08-13 DIAGNOSIS — G4733 Obstructive sleep apnea (adult) (pediatric): Secondary | ICD-10-CM

## 2018-08-13 NOTE — Telephone Encounter (Signed)
Call returned to patient wife, requested to keep appt. Made aware okay to keep appt. Nothing further is needed at this time.

## 2018-08-13 NOTE — Telephone Encounter (Signed)
Patient's wife states he needs to come to appt. Today,. He needs his CPAP machine needs to be adjusted.  Patient's wife [name] phone number is [phone]. His appt. Is this afternoon at 2:45 pm.

## 2018-08-13 NOTE — Patient Instructions (Signed)
Obstructive sleep apnea on CPAP therapy  Referral to DME  Adjust pressures from 10-15, change pressures to 10-20 Supply Sim card so we can get downloads to monitor patient's compliance  I will see you back in the office in about 6 months  Call with significant concerns
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
## 2018-08-14 NOTE — Telephone Encounter (Signed)
Sent to the pharmacy by e-scribe. 

## 2018-08-20 ENCOUNTER — Other Ambulatory Visit (HOSPITAL_COMMUNITY): Payer: Self-pay | Admitting: *Deleted

## 2018-08-20 DIAGNOSIS — I482 Chronic atrial fibrillation, unspecified: Secondary | ICD-10-CM

## 2018-08-20 MED ORDER — APIXABAN 5 MG PO TABS: 5.0000 mg | ORAL_TABLET | Freq: Two times a day (BID) | ORAL | 3 refills | Status: DC

## 2018-08-20 MED ORDER — FUROSEMIDE 20 MG PO TABS: ORAL_TABLET | ORAL | 3 refills | Status: DC

## 2018-08-20 MED ORDER — RANOLAZINE ER 500 MG PO TB12: 500.0000 mg | ORAL_TABLET | Freq: Every day | ORAL | 2 refills | Status: DC

## 2018-08-20 MED ORDER — ISOSORB DINITRATE-HYDRALAZINE 20-37.5 MG PO TABS: 2.0000 | ORAL_TABLET | Freq: Two times a day (BID) | ORAL | 2 refills | Status: DC

## 2018-08-20 MED ORDER — SACUBITRIL-VALSARTAN 97-103 MG PO TABS: 1.0000 | ORAL_TABLET | Freq: Two times a day (BID) | ORAL | 3 refills | Status: DC

## 2018-08-20 MED ORDER — METOPROLOL SUCCINATE ER 50 MG PO TB24: ORAL_TABLET | ORAL | 2 refills | Status: DC

## 2018-08-20 MED ORDER — ROSUVASTATIN CALCIUM 40 MG PO TABS: 40.0000 mg | ORAL_TABLET | Freq: Every evening | ORAL | 3 refills | Status: DC

## 2018-08-21 ENCOUNTER — Telehealth: Payer: Self-pay

## 2018-08-21 NOTE — Telephone Encounter (Signed)
Left message for pt regarding setting up a virtual visit for 08/23/18.

## 2018-08-22 ENCOUNTER — Telehealth: Payer: Self-pay

## 2018-08-22 ENCOUNTER — Telehealth: Payer: Self-pay | Admitting: Adult Health

## 2018-08-22 ENCOUNTER — Telehealth (HOSPITAL_COMMUNITY): Payer: Self-pay

## 2018-08-22 NOTE — Telephone Encounter (Signed)
Copied from [place] [phone]. Topic: Quick Communication - See Telephone Encounter >> Aug 22, 2018  3:02 PM Ivar Drape wrote: CRM for notification. See Telephone encounter for: 08/22/18. The patient wanted the doctor to know that he was eating and everything went black. They called the Cardiologist and they did run some test and said everything looked good, but to keep an eye on him.

## 2018-08-22 NOTE — Telephone Encounter (Signed)
Please advise 

## 2018-08-22 NOTE — Telephone Encounter (Signed)
  Wife returning call from yesterday

## 2018-08-22 NOTE — Telephone Encounter (Signed)
Spoke with pt wife regarding MyChart visit on 08/23/18. Pt wife was explained steps to set up visit. Pt wife was afvise to check pulse and BP prior to appt. All queetions and concerns were address.

## 2018-08-22 NOTE — Telephone Encounter (Signed)
Prior authorization through [employer] was APPROVED for [employer] and will expire on 05/22/2019.

## 2018-08-22 NOTE — Telephone Encounter (Signed)
Pt wife states the pt thinks he may have gotten shocked by his ICD. The patient is sweating and hot. The pt blacked out. His wife states his blood pressure and heart rate are fine. The patient overall does not feel well.

## 2018-08-22 NOTE — Telephone Encounter (Signed)
Spoke with patient's wife. Patient did not eat, drink, or take his medicines this morning. He had just finished a bowl of cereal for lunch, got up to put bowl in sink, said everything went black, sat down, thought he may have received a shock. Felt hot all over. Feeling better now, resting. BP 122/70 and CBG 122 shortly after the episode.   Pt's wife reports she thinks his blood sugar was low as he had not eaten since yesterday. Calling his PCP after speaking with [place]. No other missed doses of cardiac meds with exception of this AM. Encouraged compliance with medication regimen, as well as eating/drinking on a more reliable schedule.  Advised transmission shows Ap/Vs rhythm with PVCs. No treated episodes. 47 non-treated episodes since last check on 06/04/18-- 46 NSVT episodes (5 beat run today at 0527) and 1 VT-1 monitored episode on 07/04/18 (EGM suggests 1:1 SVT w/PVCs). Will send to Dr. Rayann Heman as Juluis Rainier. Pt's wife verbalizes understanding that there were no arrhythmias to explain episode. She will plan to discuss episode further with pt's PCP. Video visit scheduled for 08/23/18.  Presenting rhythm:

## 2018-08-23 ENCOUNTER — Telehealth (INDEPENDENT_AMBULATORY_CARE_PROVIDER_SITE_OTHER): Payer: Medicare HMO | Admitting: Internal Medicine

## 2018-08-23 ENCOUNTER — Other Ambulatory Visit (HOSPITAL_COMMUNITY): Payer: Self-pay | Admitting: Internal Medicine

## 2018-08-23 VITALS — BP 122/74 | HR 80

## 2018-08-23 DIAGNOSIS — I5022 Chronic systolic (congestive) heart failure: Secondary | ICD-10-CM

## 2018-08-23 DIAGNOSIS — I472 Ventricular tachycardia: Secondary | ICD-10-CM | POA: Diagnosis not present

## 2018-08-23 DIAGNOSIS — I480 Paroxysmal atrial fibrillation: Secondary | ICD-10-CM

## 2018-08-23 NOTE — Telephone Encounter (Signed)
Possibly had a hypoglycemic episode d/t not eating or drinking anything. Needs more structured meals and snacks.
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
## 2018-08-23 NOTE — Telephone Encounter (Signed)
Spoke with patient wife - aware of recommendations per [place] Wife states that they are getting out and going walking today. She is going to work on making sure he is eating and drinking like he is supposed to and taking his medications at scheduled times. I explained the importance of eating and staying hydrated as well as taking his medications as prescribed, pt expressed understanding and states that he is feeling much better today and has already started out doing a better job at this today. Pt advised that if he has another episode to call our office. Wife asked for tips on if BS drops low again what he can eat -- Advised two good things to try (in moderation) would be orange juice and peanut butter/peanut butter crackers for hypoglycemia episodes but to always contact PCP if sx's become severe.   Nothing further needed.

## 2018-08-26 ENCOUNTER — Other Ambulatory Visit: Payer: Self-pay | Admitting: Neurology

## 2018-08-26 ENCOUNTER — Other Ambulatory Visit (HOSPITAL_COMMUNITY): Payer: Self-pay | Admitting: *Deleted

## 2018-08-26 MED ORDER — SOTALOL HCL 80 MG PO TABS: 80.0000 mg | ORAL_TABLET | Freq: Every day | ORAL | 1 refills | Status: DC

## 2018-08-26 MED ORDER — ISOSORB DINITRATE-HYDRALAZINE 20-37.5 MG PO TABS: 2.0000 | ORAL_TABLET | Freq: Two times a day (BID) | ORAL | 2 refills | Status: DC

## 2018-08-26 NOTE — Telephone Encounter (Signed)
Patient is needing a refill on his Vimpat medication. He needs a 90 day supply. Please Call. Thanks

## 2018-08-27 ENCOUNTER — Ambulatory Visit: Payer: Medicare HMO | Admitting: Nurse Practitioner

## 2018-08-27 MED ORDER — LACOSAMIDE 50 MG PO TABS: ORAL_TABLET | ORAL | 5 refills | Status: DC

## 2018-08-27 NOTE — Telephone Encounter (Signed)
Pt had f/u with Dr. Rayann Heman.

## 2018-08-27 NOTE — Telephone Encounter (Signed)
Vimpat #[id number] with 3 refills Sig = take 1.5 tab AM, 2 tab QHS  Forwarded to Dr. Delice Lesch for approval

## 2018-09-04 ENCOUNTER — Other Ambulatory Visit: Payer: Self-pay

## 2018-09-04 MED ORDER — LACOSAMIDE 50 MG PO TABS: ORAL_TABLET | ORAL | 3 refills | Status: DC

## 2018-09-04 MED ORDER — LACOSAMIDE 100 MG PO TABS: 100.0000 mg | ORAL_TABLET | Freq: Every day | ORAL | 3 refills | Status: DC

## 2018-09-04 NOTE — Telephone Encounter (Signed)
Received call from pt's pharmacy stating that insurance will not cover current Vimpat Rx - stating that quantity limit is #3 for the 50mg .  Pharmacist and I discussed sending 2 separate Rx's with different doses, and running it through the insurance.    Forwarded to Dr. Delice Lesch for approval.

## 2018-09-12 ENCOUNTER — Ambulatory Visit (INDEPENDENT_AMBULATORY_CARE_PROVIDER_SITE_OTHER): Payer: Medicare HMO | Admitting: Adult Health

## 2018-09-12 ENCOUNTER — Other Ambulatory Visit: Payer: Self-pay

## 2018-09-12 ENCOUNTER — Telehealth: Payer: Self-pay | Admitting: *Deleted

## 2018-09-12 DIAGNOSIS — E1165 Type 2 diabetes mellitus with hyperglycemia: Secondary | ICD-10-CM

## 2018-09-12 NOTE — Progress Notes (Signed)
?   Your request has been approved  Request Reference Number: [id number]. VIMPAT TAB 50MG  is approved through 05/22/2019. For further questions, call [phone].
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
## 2018-09-12 NOTE — Telephone Encounter (Signed)
Pt now scheduled to see Cory at 11:30 AM today.

## 2018-09-12 NOTE — Telephone Encounter (Signed)
Copied from [place] [id number]. Topic: General - Inquiry >> Sep 12, 2018  9:28 AM Scherrie Gerlach wrote: Reason for CRM: wife calling to advise pt's sugars have been all over the place.  Pt had not taken insulin last night when it was 332.  This am it was 120. However it has been 180, 183, 206, 136, 190, all over. But most of these readings prior to taking insulin too. . She states pt will not eat right, goes w/out eating, then he feels bad. Pt thinks he cannot eat, and she keeps telling he can.  She is hoping Tommi Rumps can speak with pt and advise him. (portion control) Also wants to know if pt may need to add something to his meds to keep sugars more in line.

## 2018-09-26 ENCOUNTER — Encounter: Payer: Self-pay | Admitting: Family Medicine

## 2018-09-29 NOTE — Progress Notes (Addendum)
Virtual Visit via Telephone Note   I connected with [name] on 10/01/18 at  9:30 AM EDT by telephone and verified that I am speaking with the correct person using two identifiers.  Location: Patient: Home Provider: Office   I discussed the limitations, risks, security and privacy concerns of performing an evaluation and management service by telephone and the availability of in person appointments. I also discussed with the patient that there may be a patient responsible charge related to this service. The patient expressed understanding and agreed to proceed.  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  Former patient of Dr. Ashok Cordia, now being managed by Dr. Chase Caller PMH: Afib, CHF (on entresto), CAD, GERD Smoker/ Smoking History: Former Smoker, quit 1986, 46 pack years. Maintenance:  Symbicort 160 Pt of: Dr. Chase Caller, Dr Ander Slade for sleep   Chief complaint: ILD follow up   73 year old male former smoker followed in our office for ILD, COPD and obstructive sleep apnea.  Patient reports that his breathing has been stable since last office visit.  He does admit that he forgets to take his evening dose of Symbicort 160 often.  He reports that he takes the 2 puffs in the morning regularly but more than 50% of the time forgets to take the afternoon dose.  He admits that this is not because of the cost of the medication but simply that he does not want to have to get up and rinse his mouth out and he forgets.  Patient reports that he will try to do better on this.  He reports that he is started to become more active and has been walking 1-2 times a week at the park.  He averages walking about half a mile before he gets short of breath and that he needs to stop and rest.  He is not as  active on the other days of the week but does remain busy at home.  Patient denies wheezing, fever, body aches, chills, lightheadedness, chest pain, palpitations.  He does have increased anxiety during the COVID-19 pandemic for both him and his spouse as they both have chronic pulmonary diseases.  MMRC - Breathlessness Score 0 - I will get breathless with strenuous exercise  Patient is not currently having any issues using his CPAP.  He is using his nasal mask.  Unfortunately pressure settings were never changed after March/2020 office visit with Dr. Ander Slade.  CPAP compliance report listed below shows good compliance with some leaks.  Patient denies having mask leaks at this time.  08/31/2018-09/29/2018-CPAP compliance report-30 out of last 30 days used, all 30 those days greater than 4 hours, average usage 7 hours and 40 minutes, APAP settings 10-15, 95th percentile pressure setting 14.1, AHI 7.4, often leaks  Patient and spouse are both interested in switching DME companies to [employer].  They would like to have more information regarding this.   Observations/Objective:  06/19/2018-pulmonary function test- FVC 3.66 (99% predicted), postbronchodilator ratio 72, postbronchodilator FEV1 2.64 (95% predicted), no significant bronchodilator response, DLCO 70  04/10/17: FVC 3.65 L (95%) FEV1 2.81 L (97%) FEV1/FVC 0.77 FEF 25-75 2.33 L (94%) negative bronchodilator response TLC 5.98 L (85%) RV 92% ERV 106% DLCO corrected 64% 11/08/16: FVC 3.79 L (98%) FEV1 2.53 L (87%) FEV1/FVC 0.67 FEF 25-75 1.73 L (  69%)                                                                                                                        DLCO corrected 63% 08/08/16: FVC 3.76 L (97%) FEV1 2.89 L (99%) FEV1/FVC 0.77 FEF 25-75 2.56 L (102%)                                                                                                                       DLCO corrected 57% 05/02/16: FVC 3.67 L (95%) FEV1 2.75 L  (94%) FEV1/FVC 0.75 FEF 25-75 2.31 L (92%)                                                                                                                         DLCO corrected 59% (Hgb 13.2) 12/29/15:FVC 3.52 L (91%) FEV1 2.67 L (91%) FEV1/FVC 0.76 FEF 25-75 2.41 L (95%) negative bronchodilator response TLC 5.99 L (85%) RV 97% ERV 35% DLCO uncorrected 53% 10/20/15:FVC 3.63 L (92%) FEV1 2.81 L (94%) FEV1/FVC 0.77 FEF 25-75 2.37 L (92%) negative bronchodilator response TLC 6.15 L (86%) RV 97% ERV 54% DLCO corrected 73% (hgb 13.8)  POLYSOMNOGRAM/CPAP TITRATION 02/11/16: Optimal CPAP pressure 13 cm H2O. No central apneas noted. PVCs with sinus rhythm. Severe periodic leg movements were noted. Lowest saturation 92%. 08/24/05:  Lowest saturation 79% &Baseline saturation 97%. Periodic limb movement index 0. AHI 84 events/hour. Respiratory disturbances not related to sleep stage. Patient slept exclusively in supine position. Paced rhythm with frequent PVCs noted. Patient was ultimately titrated to an optimal pressure on BiPAP at 29/24 with resolution of snoring. He was recommended to start BiPAP at 29/25 with an InnoMed Hybrid mask & medium nasal pillows due to leakage.   Imaging:  HRCT CHEST W/O 01/04/16 :Mild diffuse bronchial wall thickening with mild paraseptal emphysema with some subpleural bleb formation. 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 No pleural effusion or thickening. Very  small pericardial fluid versus pericardial thickening. No pathologic mediastinal adenopathy. No evidence of bronchiectasis or honeycomb changes. No opacities. Thickened esophageal wall.   Cardiac:  TTE (09/08/15):Mild LVH &moderate dilation with EF 25-30%.Septal, apical, &inferior wall hypokinesis. LA moderately dilated &RA normal in size. RV normal in size and function. Pulmonary artery systolic pressure  32 mmHg. No aortic stenosis. Mild-to-moderate  mitral regurgitation. Mild pulmonic regurgitation. Mild tricuspid regurgitation.   Labs:   01/27/16 Alpha-1 antitrypsin: MM (132) CRP: 0.1  ESR: 12 ANA: Negative Anti-Jo1:  <0.2 Centromere Ab Screen:  <0.2 Anti-CCP:  <16 DS DNA Ab:  <1 RNP Ab:  0.5 SSA:  <0.2 SSB:  <0.2 SCL-70:  <0.2 Smith Ab:  <0.2 Chromatin Ab:  <0.2 Hypersensitivity Pneumonitis Panel:  Negative    Assessment and Plan:  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[place]) Assessment: Former smoker Managed on Symbicort 160 Patient struggles with compliance with Symbicort 160 January/2020 spirometry and DLCO  Shows COPD Gold 0 and DLCO of 70 mMRC 0 today Patient denies wheezing, increased cough, increased shortness of breath  Plan: Continue Symbicort 160 Emphasized the importance of taking Symbicort 160 correctly to the patient Reviewed instructions on how to take Symbicort with the patient as well as patient's spouse Continue rescue inhaler every 4-6 hours as needed for shortness of breath and wheezing Continue Xyzal daily Continue to improve daily exercise regimen Follow-up with Dr. Chase Caller in 3 months  Obstructive sleep apnea Assessment: 2007 sleep study shows AHI of 84 Previous CPAP settings were not changed after March/2020 office visit Patient would like to switch DME companies to [employer] Patient using nasal mask and likes fit and does not feel that he is having leaks CPAP compliance report today is good showing moderately controlled AHI 7.4  Plan: Continue CPAP as planned Change CPAP pressure settings to APAP 15-20, based off of Dr. Ander Slade March/2020 office note 58-monthcompliance check requested Follow-up in September/2020 with Dr. OAnder Slade  ILD (interstitial lung disease) ([facility] Assessment: 2017 high-resolution CT showing paraseptal emphysema and subpleural bleb formation mMRC 0 today January/2020 spirometry with DLCO shows a DLCO 70  Plan: Follow-up with Dr. RChase Callerin 3 months in ILD  clinic   Follow Up Instructions:  Return in about 3 months (around 01/01/2019), or if symptoms worsen or fail to improve, for Follow up with Dr. RPurnell Shoemaker   I discussed the assessment and treatment plan with the patient. The patient was provided an opportunity to ask questions and all were answered. The patient agreed with the plan and demonstrated an understanding of the instructions.   The patient was advised to call back or seek an in-person evaluation if the symptoms worsen or if the condition fails to improve as anticipated.  I provided 24 minutes of non-face-to-face time during this encounter.   BLauraine Rinne NP

## 2018-10-01 ENCOUNTER — Ambulatory Visit (INDEPENDENT_AMBULATORY_CARE_PROVIDER_SITE_OTHER): Payer: Medicare HMO | Admitting: Pulmonary Disease

## 2018-10-01 ENCOUNTER — Ambulatory Visit: Payer: Medicare Other | Admitting: Internal Medicine

## 2018-10-01 ENCOUNTER — Telehealth: Payer: Self-pay | Admitting: Pulmonary Disease

## 2018-10-01 ENCOUNTER — Encounter: Payer: Self-pay | Admitting: Pulmonary Disease

## 2018-10-01 ENCOUNTER — Other Ambulatory Visit: Payer: Self-pay

## 2018-10-01 DIAGNOSIS — J438 Other emphysema: Secondary | ICD-10-CM | POA: Diagnosis not present

## 2018-10-01 DIAGNOSIS — G4733 Obstructive sleep apnea (adult) (pediatric): Secondary | ICD-10-CM | POA: Diagnosis not present

## 2018-10-01 DIAGNOSIS — J849 Interstitial pulmonary disease, unspecified: Secondary | ICD-10-CM

## 2018-10-01 NOTE — Assessment & Plan Note (Signed)
Assessment: Former smoker Managed on Symbicort 160 Patient struggles with compliance with Symbicort 160 January/2020 spirometry and DLCO  Shows COPD Gold 0 and DLCO of 70 mMRC 0 today Patient denies wheezing, increased cough, increased shortness of breath  Plan: Continue Symbicort 160 Emphasized the importance of taking Symbicort 160 correctly to the patient Reviewed instructions on how to take Symbicort with the patient as well as patient's spouse Continue rescue inhaler every 4-6 hours as needed for shortness of breath and wheezing Continue Xyzal daily Continue to improve daily exercise regimen Follow-up with Dr. Chase Caller in 3 months

## 2018-10-01 NOTE — Patient Instructions (Addendum)
Continue Symbicort 160 >>> 2 puffs in the morning right when you wake up, rinse out your mouth after use, 12 hours later 2 puffs, rinse after use >>> Take this daily, no matter what >>> This is not a rescue inhaler   Only use your albuterol as a rescue medication to be used if you can't catch your breath by resting or doing a relaxed purse lip breathing pattern.  - The less you use it, the better it will work when you need it. - Ok to use up to 2 puffs every 4 hours if you must but call for immediate appointment if use goes up over your usual need - Don't leave home without it !! (think of it like the spare tire for your car)    Continue Xyzal daily  We will change your CPAP settings to 15-20 cm   We will contact you regarding switching DME companies to [facility]  We recommend that you continue using your CPAP daily >>>Keep up the hard work using your device >>> Goal should be wearing this for the entire night that you are sleeping, at least 4 to 6 hours  Remember:   Do not drive or operate heavy machinery if tired or drowsy.   Please notify the supply company and office if you are unable to use your device regularly due to missing supplies or machine being broken.   Work on maintaining a healthy weight and following your recommended nutrition plan   Maintain proper daily exercise and movement   Maintaining proper use of your device can also help improve management of other chronic illnesses such as: Blood pressure, blood sugars, and weight management.   BiPAP/ CPAP Cleaning:  >>>Clean weekly, with Dawn soap, and bottle brush.  Set up to air dry.   Return in about 3 months (around 01/01/2019), or if symptoms worsen or fail to improve, for Follow up with Dr. Purnell Shoemaker.  Return in September/2020 to see Dr. Ander Slade  Coronavirus (COVID-19) Are you at risk?  Are you at risk for the Coronavirus (COVID-19)?  To be considered HIGH RISK for Coronavirus  (COVID-19), you have to meet the following criteria:  . Traveled to [place], [place], [place], [place] or [place]; or in the [place] to [place], [place], [place], or [place]; and have fever, cough, and shortness of breath within the last 2 weeks of travel OR . Been in close contact with a person diagnosed with COVID-19 within the last 2 weeks and have fever, cough, and shortness of breath . IF YOU DO NOT MEET THESE CRITERIA, YOU ARE CONSIDERED LOW RISK FOR COVID-19.  What to do if you are HIGH RISK for COVID-19?  Marland Kitchen If you are having a medical emergency, call 911. . Seek medical care right away. Before you go to a doctor's office, urgent care or emergency department, call ahead and tell them about your recent travel, contact with someone diagnosed with COVID-19, and your symptoms. You should receive instructions from your physician's office regarding next steps of care.  . When you arrive at healthcare provider, tell the healthcare staff immediately you have returned from visiting [place], [place], [place], [place] or [place]; or traveled in the [place] to [place], [place], [place], or [place]; in the last two weeks or you have been in close contact with a person diagnosed with COVID-19 in the last 2 weeks.   . Tell the health care staff about your symptoms: fever, cough and  shortness of breath. . After you have been seen by a medical provider, you will be either: o Tested for (COVID-19) and discharged home on quarantine except to seek medical care if symptoms worsen, and asked to  - Stay home and avoid contact with others until you get your results (4-5 days)  - Avoid travel on public transportation if possible (such as bus, train, or airplane) or o Sent to the Emergency Department by EMS for evaluation, COVID-19 testing, and possible admission depending on your condition and test results.  What to do if you are LOW RISK for COVID-19?  Reduce your risk of any infection by  using the same precautions used for avoiding the common cold or flu:  Marland Kitchen Wash your hands often with soap and warm water for at least 20 seconds.  If soap and water are not readily available, use an alcohol-based hand sanitizer with at least 60% alcohol.  . If coughing or sneezing, cover your mouth and nose by coughing or sneezing into the elbow areas of your shirt or coat, into a tissue or into your sleeve (not your hands). . Avoid shaking hands with others and consider head nods or verbal greetings only. . Avoid touching your eyes, nose, or mouth with unwashed hands.  . Avoid close contact with people who are sick. . Avoid places or events with large numbers of people in one location, like concerts or sporting events. . Carefully consider travel plans you have or are making. . If you are planning any travel outside or inside the [place], visit the CDC's Travelers' Health webpage for the latest health notices. . If you have some symptoms but not all symptoms, continue to monitor at home and seek medical attention if your symptoms worsen. . If you are having a medical emergency, call 911.   [facility] / e-Visit: [email or web address]         [facility]: [facility]: [phone]                   [facility]: [phone]           It is flu season:   >>> Best ways to protect herself from the flu: Receive the yearly flu vaccine, practice good hand hygiene washing with soap and also using hand sanitizer when available, eat a nutritious meals, get adequate rest, hydrate appropriately   Please contact the office if your symptoms worsen or you have concerns that you are not improving.   Thank you for choosing [facility] for your healthcare, and for allowing [place] to partner with you on your healthcare journey. I am thankful to be able  to provide care to you today.   Caleb Quaker FNP-C       Living With Sleep Apnea Sleep apnea is a condition in which breathing pauses or becomes shallow during sleep. Sleep apnea is most commonly caused by a collapsed or blocked airway. People with sleep apnea snore loudly and have times when they gasp and stop breathing for 10 seconds or more during sleep. This happens over and over during the night. This disrupts your sleep and keeps your body from getting the rest that it needs, which can cause tiredness and lack of energy (fatigue) during the day. The breaks in breathing also interrupt the deep sleep that you need to feel rested. Even if you do not completely wake up from the gaps  in breathing, your sleep may not be restful. You may also have a headache in the morning and low energy during the day, and you may feel anxious or depressed. How can sleep apnea affect me? Sleep apnea increases your chances of extreme tiredness during the day (daytime fatigue). It can also increase your risk for health conditions, such as:  Heart attack.  Stroke.  Diabetes.  Heart failure.  Irregular heartbeat.  High blood pressure. If you have daytime fatigue as a result of sleep apnea, you may be more likely to:  Perform poorly at school or work.  Fall asleep while driving.  Have difficulty with attention.  Develop depression or anxiety.  Become severely overweight (obese).  Have sexual dysfunction. What actions can I take to manage sleep apnea? Sleep apnea treatment   If you were given a device to open your airway while you sleep, use it only as told by your health care provider. You may be given: ? An oral appliance. This is a custom-made mouthpiece that shifts your lower jaw forward. ? A continuous positive airway pressure (CPAP) device. This device blows air through a mask when you breathe out (exhale). ? A nasal expiratory positive airway pressure (EPAP) device. This device has valves  that you put into each nostril. ? A bi-level positive airway pressure (BPAP) device. This device blows air through a mask when you breathe in (inhale) and breathe out (exhale).  You may need surgery if other treatments do not work for you. Sleep habits  Go to sleep and wake up at the same time every day. This helps set your internal clock (circadian rhythm) for sleeping. ? If you stay up later than usual, such as on weekends, try to get up in the morning within 2 hours of your normal wake time.  Try to get at least 7-9 hours of sleep each night.  Stop computer, tablet, and mobile phone use a few hours before bedtime.  Do not take long naps during the day. If you nap, limit it to 30 minutes.  Have a relaxing bedtime routine. Reading or listening to music may relax you and help you sleep.  Use your bedroom only for sleep. ? Keep your television and computer out of your bedroom. ? Keep your bedroom cool, dark, and quiet. ? Use a supportive mattress and pillows.  Follow your health care provider's instructions for other changes to sleep habits. Nutrition  Do not eat heavy meals in the evening.  Do not have caffeine in the later part of the day. The effects of caffeine can last for more than 5 hours.  Follow your health care provider's or dietitian's instructions for any diet changes. Lifestyle      Do not drink alcohol before bedtime. Alcohol can cause you to fall asleep at first, but then it can cause you to wake up in the middle of the night and have trouble getting back to sleep.  Do not use any products that contain nicotine or tobacco, such as cigarettes and e-cigarettes. If you need help quitting, ask your health care provider. Medicines  Take over-the-counter and prescription medicines only as told by your health care provider.  Do not use over-the-counter sleep medicine. You can become dependent on this medicine, and it can make sleep apnea worse.  Do not use  medicines, such as sedatives and narcotics, unless told by your health care provider. Activity  Exercise on most days, but avoid exercising in the evening. Exercising near bedtime can  interfere with sleeping.  If possible, spend time outside every day. Natural light helps regulate your circadian rhythm. General information  Lose weight if you need to, and maintain a healthy weight.  Keep all follow-up visits as told by your health care provider. This is important.  If you are having surgery, make sure to tell your health care provider that you have sleep apnea. You may need to bring your device with you. Where to find more information Learn more about sleep apnea and daytime fatigue from:  American Sleep Association: sleepassociation.[place]: sleepfoundation.org  National Heart, Lung, and Blood Institute: https://www.hartman-hill.biz/ Summary  Sleep apnea can cause daytime fatigue and other serious health conditions.  Both sleep apnea and daytime fatigue can be bad for your health and well-being.  You may need to wear a device while sleeping to help keep your airway open.  If you are having surgery, make sure to tell your health care provider that you have sleep apnea. You may need to bring your device with you.  Making changes to sleep habits, diet, lifestyle, and activity can help you manage sleep apnea. This information is not intended to replace advice given to you by your health care provider. Make sure you discuss any questions you have with your health care provider. Document Released: 08/02/2017 Document Revised: 01/08/2018 Document Reviewed: 08/02/2017 Elsevier Interactive Patient Education  2019 Elsevier Inc.     Chronic Obstructive Pulmonary Disease Chronic obstructive pulmonary disease (COPD) is a long-term (chronic) lung problem. When you have COPD, it is hard for air to get in and out of your lungs. Usually the condition gets worse over time, and your  lungs will never return to normal. There are things you can do to keep yourself as healthy as possible.  Your doctor may treat your condition with: ? Medicines. ? Oxygen. ? Lung surgery.  Your doctor may also recommend: ? Rehabilitation. This includes steps to make your body work better. It may involve a team of specialists. ? Quitting smoking, if you smoke. ? Exercise and changes to your diet. ? Comfort measures (palliative care). Follow these instructions at home: Medicines  Take over-the-counter and prescription medicines only as told by your doctor.  Talk to your doctor before taking any cough or allergy medicines. You may need to avoid medicines that cause your lungs to be dry. Lifestyle  If you smoke, stop. Smoking makes the problem worse. If you need help quitting, ask your doctor.  Avoid being around things that make your breathing worse. This may include smoke, chemicals, and fumes.  Stay active, but remember to rest as well.  Learn and use tips on how to relax.  Make sure you get enough sleep. Most adults need at least 7 hours of sleep every night.  Eat healthy foods. Eat smaller meals more often. Rest before meals. Controlled breathing Learn and use tips on how to control your breathing as told by your doctor. Try:  Breathing in (inhaling) through your nose for 1 second. Then, pucker your lips and breath out (exhale) through your lips for 2 seconds.  Putting one hand on your belly (abdomen). Breathe in slowly through your nose for 1 second. Your hand on your belly should move out. Pucker your lips and breathe out slowly through your lips. Your hand on your belly should move in as you breathe out.  Controlled coughing Learn and use controlled coughing to clear mucus from your lungs. Follow these steps: 1. Lean your head  a little forward. 2. Breathe in deeply. 3. Try to hold your breath for 3 seconds. 4. Keep your mouth slightly open while coughing 2  times. 5. Spit any mucus out into a tissue. 6. Rest and do the steps again 1 or 2 times as needed. General instructions  Make sure you get all the shots (vaccines) that your doctor recommends. Ask your doctor about a flu shot and a pneumonia shot.  Use oxygen therapy and pulmonary rehabilitation if told by your doctor. If you need home oxygen therapy, ask your doctor if you should buy a tool to measure your oxygen level (oximeter).  Make a COPD action plan with your doctor. This helps you to know what to do if you feel worse than usual.  Manage any other conditions you have as told by your doctor.  Avoid going outside when it is very hot, cold, or humid.  Avoid people who have a sickness you can catch (contagious).  Keep all follow-up visits as told by your doctor. This is important. Contact a doctor if:  You cough up more mucus than usual.  There is a change in the color or thickness of the mucus.  It is harder to breathe than usual.  Your breathing is faster than usual.  You have trouble sleeping.  You need to use your medicines more often than usual.  You have trouble doing your normal activities such as getting dressed or walking around the house. Get help right away if:  You have shortness of breath while resting.  You have shortness of breath that stops you from: ? Being able to talk. ? Doing normal activities.  Your chest hurts for longer than 5 minutes.  Your skin color is more blue than usual.  Your pulse oximeter shows that you have low oxygen for longer than 5 minutes.  You have a fever.  You feel too tired to breathe normally. Summary  Chronic obstructive pulmonary disease (COPD) is a long-term lung problem.  The way your lungs work will never return to normal. Usually the condition gets worse over time. There are things you can do to keep yourself as healthy as possible.  Take over-the-counter and prescription medicines only as told by your  doctor.  If you smoke, stop. Smoking makes the problem worse. This information is not intended to replace advice given to you by your health care provider. Make sure you discuss any questions you have with your health care provider. Document Released: 10/25/2007 Document Revised: 06/12/2016 Document Reviewed: 06/12/2016 Elsevier Interactive Patient Education  2019 [employer].

## 2018-10-01 NOTE — Telephone Encounter (Signed)
Discussed case with [name] whose reporting that the patient's insurance will not cover switching to [facility].  Per conversation with spouse earlier today patient spouse is willing to pay for this out of pocket if that is a reasonable cost.  [name] to proceed forward with switching to [facility].  [name], FNP

## 2018-10-01 NOTE — Assessment & Plan Note (Signed)
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 Chase Caller in 3 months in [facility] clinic

## 2018-10-01 NOTE — Telephone Encounter (Signed)
10/01/2018 0941  Patient requested today that he would like to switch DME companies to [facility].  Patient spouse also would like to switch to [facility].  Patient spouse is a patient of Dr. Annamaria Boots.  Lauren can you please contact the patient as well as spouse to get their information so we can place these orders.  I did notify the patient as well as patient's spouse that this is again to be based off of insurance to whether or not they will be able to switch at this time.  They are aware.  [name], FNP

## 2018-10-01 NOTE — Telephone Encounter (Signed)
Noted. Routing to [place] so she can get it taken care of for pt.

## 2018-10-01 NOTE — Assessment & Plan Note (Signed)
Assessment: 2007 sleep study shows AHI of 84 Previous CPAP settings were not changed after March/2020 office visit Patient would like to switch DME companies to [facility] Patient using nasal mask and likes fit and does not feel that he is having leaks CPAP compliance report today is good showing moderately controlled AHI 7.4  Plan: Continue CPAP as planned Change CPAP pressure settings to APAP 15-20, based off of Dr. Ander Slade March/2020 office note 13-month compliance check requested Follow-up in September/2020 with Dr. Ander Slade

## 2018-10-01 NOTE — Telephone Encounter (Signed)
This pt is wanting to switch to [facility].  I have sent the order to the patient earlier today & they have rec'd the referral.

## 2018-10-05 ENCOUNTER — Other Ambulatory Visit: Payer: Self-pay | Admitting: Pulmonary Disease

## 2018-10-05 DIAGNOSIS — W57XXXA Bitten or stung by nonvenomous insect and other nonvenomous arthropods, initial encounter: Secondary | ICD-10-CM

## 2018-10-08 NOTE — Telephone Encounter (Addendum)
According to most recent update to order from 10/01/2018, [facility] would gather the information they needed in order to proceed with the switch and then contact the pt. Called & spoke w/ pt's wife, [name] (on [place]) to see if the pt had heard from [facility]. Pt's wife stated they have not been contacted yet.   Will await further updates and will keep in Lauren's box to f/u up on.

## 2018-10-09 ENCOUNTER — Ambulatory Visit (INDEPENDENT_AMBULATORY_CARE_PROVIDER_SITE_OTHER): Payer: Medicare HMO | Admitting: *Deleted

## 2018-10-09 DIAGNOSIS — I472 Ventricular tachycardia, unspecified: Secondary | ICD-10-CM

## 2018-10-09 DIAGNOSIS — I255 Ischemic cardiomyopathy: Secondary | ICD-10-CM

## 2018-10-09 LAB — CUP PACEART REMOTE DEVICE CHECK
Battery Remaining Longevity: 24 mo
Battery Remaining Percentage: 30 %
Brady Statistic RA Percent Paced: 28 %
Brady Statistic RV Percent Paced: 0 %
Date Time Interrogation Session: 20200520082100
HighPow Impedance: 51 Ohm
Implantable Lead Implant Date: 20050922
Implantable Lead Implant Date: 20050922
Implantable Lead Location: 753859
Implantable Lead Location: 753860
Implantable Lead Model: 158
Implantable Lead Model: 5076
Implantable Lead Serial Number: 156891
Implantable Pulse Generator Implant Date: 20100817
Lead Channel Impedance Value: 452 Ohm
Lead Channel Impedance Value: 490 Ohm
Lead Channel Pacing Threshold Amplitude: 0.6 V
Lead Channel Pacing Threshold Amplitude: 1 V
Lead Channel Pacing Threshold Pulse Width: 0.4 ms
Lead Channel Pacing Threshold Pulse Width: 0.4 ms
Lead Channel Setting Pacing Amplitude: 2 V
Lead Channel Setting Pacing Amplitude: 2.4 V
Lead Channel Setting Pacing Pulse Width: 0.4 ms
Lead Channel Setting Sensing Sensitivity: 0.6 mV
Pulse Gen Serial Number: 141895

## 2018-10-10 ENCOUNTER — Other Ambulatory Visit: Payer: Self-pay

## 2018-10-16 DIAGNOSIS — G4733 Obstructive sleep apnea (adult) (pediatric): Secondary | ICD-10-CM | POA: Diagnosis not present

## 2018-10-18 ENCOUNTER — Telehealth (INDEPENDENT_AMBULATORY_CARE_PROVIDER_SITE_OTHER): Payer: Medicare HMO | Admitting: Neurology

## 2018-10-18 ENCOUNTER — Other Ambulatory Visit: Payer: Self-pay

## 2018-10-18 ENCOUNTER — Encounter: Payer: Self-pay | Admitting: Cardiology

## 2018-10-18 DIAGNOSIS — F0390 Unspecified dementia without behavioral disturbance: Secondary | ICD-10-CM

## 2018-10-18 DIAGNOSIS — R29898 Other symptoms and signs involving the musculoskeletal system: Secondary | ICD-10-CM

## 2018-10-18 DIAGNOSIS — G40209 Localization-related (focal) (partial) symptomatic epilepsy and epileptic syndromes with complex partial seizures, not intractable, without status epilepticus: Secondary | ICD-10-CM

## 2018-10-18 DIAGNOSIS — F03A0 Unspecified dementia, mild, without behavioral disturbance, psychotic disturbance, mood disturbance, and anxiety: Secondary | ICD-10-CM

## 2018-10-18 MED ORDER — LACOSAMIDE 100 MG PO TABS: ORAL_TABLET | ORAL | 3 refills | Status: DC

## 2018-10-18 MED ORDER — MEMANTINE HCL 10 MG PO TABS: ORAL_TABLET | ORAL | 5 refills | Status: DC

## 2018-10-18 NOTE — Progress Notes (Signed)
Remote ICD transmission.   
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
## 2018-10-21 ENCOUNTER — Telehealth: Payer: Self-pay | Admitting: Physical Therapy

## 2018-10-21 NOTE — Telephone Encounter (Signed)
LVM regarding  current processes in place at Outpatient Rehab to protect patients from Covid-19 exposure including social distancing, schedule modifications, and new cleaning procedures. After discussing their particular risk with a therapist based on the patient's personal risk factors that he is at a higher risk and suggested moving forward with telehealth.   Asked pt to call us back at their earliest convenience and let us know how they would like to  move forward with either telehealth or face to face treatment.

## 2018-10-23 NOTE — Telephone Encounter (Signed)
[name] sent to [facility]; [place] SPOKE WITH MS Lykens AND SHE WILL CALL ME BACK - SHE WAS AT THE [place]! WE DO HAVE HER ORDERS AS WELL BUT SHE NEEDS A NEW SLEEP STUDY.   I WILL FOLLOW UP IF I DO NOT HEAR BACK FROM HER TODAY.   [place]

## 2018-10-23 NOTE — Telephone Encounter (Signed)
Holly, any updates on this referral? Thank you.

## 2018-10-23 NOTE — Telephone Encounter (Signed)
I just spoke with [name] on the phone who states she will reach out to pt shortly.

## 2018-10-23 NOTE — Telephone Encounter (Signed)
Thank you, [facility]!  Freda Munro with [facility] has been made aware to contact pt. Nothing further needed at this time.

## 2018-10-24 ENCOUNTER — Telehealth: Payer: Self-pay | Admitting: Neurology

## 2018-10-24 NOTE — Telephone Encounter (Signed)
Spoke with pt wife they will try with pt taken his memantine 10mg  in the morning to see if it helps with the nightmares. I will call back next weel to follow up on how he is doing,

## 2018-10-24 NOTE — Telephone Encounter (Signed)
Can he try taking 1 tablet every morning and see if this helps? Thanks

## 2018-10-24 NOTE — Telephone Encounter (Signed)
Patient wife left message with after hour service on 10-24-18 @ 12:45   Caller states pt is on memory RX having nightmares from it he is on memantine 10mg  one at HS having nightmares from it. Has been on it for 4 days he has never had nightmares before wakes him up and he can go back to sleep h

## 2018-10-25 ENCOUNTER — Ambulatory Visit: Payer: Medicare Other | Admitting: Neurology

## 2018-10-30 ENCOUNTER — Other Ambulatory Visit (HOSPITAL_COMMUNITY): Payer: Self-pay | Admitting: Internal Medicine

## 2018-11-04 ENCOUNTER — Other Ambulatory Visit: Payer: Self-pay | Admitting: Family Medicine

## 2018-11-04 ENCOUNTER — Other Ambulatory Visit: Payer: Self-pay | Admitting: Physician Assistant

## 2018-11-04 ENCOUNTER — Other Ambulatory Visit (HOSPITAL_COMMUNITY): Payer: Self-pay | Admitting: Internal Medicine

## 2018-11-12 ENCOUNTER — Telehealth: Payer: Self-pay | Admitting: Adult Health

## 2018-11-12 NOTE — Telephone Encounter (Signed)
Pharmacy callingAnderson [name]) [place] ... Pt insurance has changed and they no longer cover his meter. Woud you send a script for the Accu Check Meter and the Accu check strips,, these are covered by insurance. Contact is Engineer, civil (consulting) at  [place], [place] - [address] [phone] (Phone) [phone] (Fax)

## 2018-11-13 MED ORDER — ACCU-CHEK GUIDE VI STRP: ORAL_STRIP | 3 refills | Status: DC

## 2018-11-13 MED ORDER — ACCU-CHEK GUIDE W/DEVICE KIT: 1.0000 | PACK | Freq: Once | 0 refills | Status: AC

## 2018-11-13 NOTE — Telephone Encounter (Signed)
Sent to the pharmacy by e-scribe.  Nothing further needed. 

## 2018-11-16 DIAGNOSIS — G4733 Obstructive sleep apnea (adult) (pediatric): Secondary | ICD-10-CM | POA: Diagnosis not present

## 2018-12-04 ENCOUNTER — Telehealth: Payer: Self-pay | Admitting: Family Medicine

## 2018-12-04 NOTE — Telephone Encounter (Signed)
Copied from [place] [id number]. Topic: General - Other >> Dec 03, 2018 12:00 PM Lennox Solders wrote: Reason for CRM: pt wife is calling to notify cory that [facility] will fax over a form to obtain PA for one touch  verio flex testing strips. Pt can use accu chek

## 2018-12-05 ENCOUNTER — Other Ambulatory Visit (HOSPITAL_COMMUNITY): Payer: Self-pay | Admitting: Internal Medicine

## 2018-12-05 ENCOUNTER — Other Ambulatory Visit: Payer: Self-pay | Admitting: Neurology

## 2018-12-05 NOTE — Telephone Encounter (Signed)
She does not want to do 100mg : Take 1.5 tabs in AM, 1 tab in PM? It is the same dose, and insurance may not approve 2 different strengths. Thanks

## 2018-12-05 NOTE — Telephone Encounter (Signed)
Wife calling back for [facility].  Please call [phone]

## 2018-12-05 NOTE — Telephone Encounter (Signed)
Patient wife returned call to [facility] can be reached at Ph# [phone]

## 2018-12-05 NOTE — Telephone Encounter (Signed)
Left a message for a return call.

## 2018-12-05 NOTE — Telephone Encounter (Signed)
Can you pls check with wife what he has been taking? I sent in an Rx for 100mg  BID last May, but if he did not tolerate and is back to taking 50mg  1.5 tab BID, then pls send for that. Thanks

## 2018-12-05 NOTE — Telephone Encounter (Signed)
Dr. Delice Lesch,  I want to make sure you are ok refilling this based on the last denial.  Per the last OV note you did want the pt on Vimpat 100mg  BID. What do you want him to do?

## 2018-12-05 NOTE — Telephone Encounter (Signed)
Th wife states that it was Namenda that the pt was not tolerating in the past, however pt continues to take. Wife states that he is doing fine now on the medication.  In regards to Vimpat. Pt takes 150mg  qam and 100mg  qhs. The wife request for 50mg  and 100mg  Rx to be sent in to [facility].  She mentions that the pharmacy has given pt the wrong medication in the past, but it was with Insulin.

## 2018-12-06 MED ORDER — ONETOUCH VERIO VI STRP: ORAL_STRIP | 3 refills | Status: DC

## 2018-12-06 NOTE — Telephone Encounter (Signed)
Spoke to [name] and ordered test strips.  Nothing further needed.

## 2018-12-06 NOTE — Telephone Encounter (Signed)
Pt wife called in and needs a refill on the [place] FLEX , the pharmacy gave the pt the wrong strips.  She is not sure if these strips is going to need a PA.  Pt is completely out   Best number [phone]

## 2018-12-06 NOTE — Addendum Note (Signed)
Addended by: Miles Costain T on: 12/06/2018 04:27 PM   Modules accepted: Orders

## 2018-12-10 ENCOUNTER — Telehealth: Payer: Self-pay | Admitting: Adult Health

## 2018-12-10 NOTE — Telephone Encounter (Signed)
Pt's wife calling.  States that pharmacist has told her that they need an override from the doctor to fill diabetic test strips. Pt uses  [place], [place] - [address] [phone] (Phone) [phone] (Fax)

## 2018-12-11 ENCOUNTER — Encounter: Payer: Self-pay | Admitting: Family Medicine

## 2018-12-11 NOTE — Telephone Encounter (Signed)
  Thank you Your appeal was successfully submitted! If you provided an email address while filling out the form, you will receive a confirmation email that we have received your request. We will start reviewing your issue right away and be in touch if we have any questions. We're deeply committed to helping you find a solution. Rate your experience Thank you for your feedback! Submission details Submitted on Dec 11, 2018 Denied medications being appealed: [name] Test Strips Decision expected by: Dec 18, 2018 7 days *

## 2018-12-12 ENCOUNTER — Other Ambulatory Visit: Payer: Self-pay

## 2018-12-12 ENCOUNTER — Telehealth: Payer: Self-pay | Admitting: Adult Health

## 2018-12-12 DIAGNOSIS — E1165 Type 2 diabetes mellitus with hyperglycemia: Secondary | ICD-10-CM

## 2018-12-12 MED ORDER — LACOSAMIDE 100 MG PO TABS: 100.0000 mg | ORAL_TABLET | Freq: Two times a day (BID) | ORAL | 3 refills | Status: DC

## 2018-12-12 NOTE — Telephone Encounter (Signed)
Wife has been giving pt 1.5 tabs of Vimpat in am and 100mg  qhs.  She is fine increasing to 100mg  BID. New prescription sent.  Also, wife wanted me to make you aware that Namenda helped with pts memory but once he increased to twice daily he started to c/o pain in his hands. Wife would like to know if this is a normal side effect of Namenda. She has decreased it but would like to start it back. Wife still says that pts memory is good on the decreased dose.

## 2018-12-12 NOTE — Telephone Encounter (Signed)
Can you pls confirm what his wife has decided for his Vimpat, I don't want him running out of medication. If he is taking 150mg  in AM and 100mg  in PM, we can do the 100mg  tablets instead of the 50mg  tabs. Thanks

## 2018-12-12 NOTE — Telephone Encounter (Signed)
Left message for pt or wife to return call.  Do they need 50mg  of Vimpat or 100mg . Per last note pt is supposed to be taking 100mg  BID

## 2018-12-12 NOTE — Telephone Encounter (Signed)
Copied from [place] [id number]. Topic: Quick Communication - Rx Refill/Question >> Dec 12, 2018  1:43 PM Nils Flack, Marland Kitchen wrote: Medication: one touch zero flex device kit   Has the patient contacted their pharmacy? Yes.   (Agent: If no, request that the patient contact the pharmacy for the refill.) (Agent: If yes, when and what did the pharmacy advise?)  Preferred Pharmacy (with phone number or street name): [facility]   Agent: Please be advised that RX refills may take up to 3 business days. We ask that you follow-up with your pharmacy.

## 2018-12-13 ENCOUNTER — Other Ambulatory Visit: Payer: Self-pay

## 2018-12-13 MED ORDER — LACOSAMIDE 100 MG PO TABS: 100.0000 mg | ORAL_TABLET | Freq: Two times a day (BID) | ORAL | 3 refills | Status: DC

## 2018-12-13 MED ORDER — VIMPAT 100 MG PO TABS: ORAL_TABLET | ORAL | 3 refills | Status: DC

## 2018-12-13 MED ORDER — ONETOUCH VERIO VI STRP: ORAL_STRIP | 3 refills | Status: DC

## 2018-12-13 NOTE — Telephone Encounter (Signed)
Wife calling to advise pt needs  one touch test strips called into a different pharmacy, the other pharmacies do not cover. [employer] will cover but must say "Part B" medically necessary.  Wife states thank you Misty for all your help.  [place] [address] [place] [phone] (Phone) [phone] (Fax)

## 2018-12-13 NOTE — Telephone Encounter (Signed)
Sent and [name] notified.

## 2019-01-03 ENCOUNTER — Other Ambulatory Visit: Payer: Self-pay | Admitting: Family Medicine
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
## 2019-01-07 ENCOUNTER — Ambulatory Visit (INDEPENDENT_AMBULATORY_CARE_PROVIDER_SITE_OTHER): Payer: Medicare HMO | Admitting: Pulmonary Disease

## 2019-01-07 ENCOUNTER — Other Ambulatory Visit: Payer: Self-pay

## 2019-01-07 ENCOUNTER — Encounter: Payer: Self-pay | Admitting: Pulmonary Disease

## 2019-01-07 VITALS — BP 142/80 | HR 75 | Temp 98.3°F | Ht 69.0 in | Wt 190.8 lb

## 2019-01-07 DIAGNOSIS — J849 Interstitial pulmonary disease, unspecified: Secondary | ICD-10-CM

## 2019-01-07 DIAGNOSIS — G4733 Obstructive sleep apnea (adult) (pediatric): Secondary | ICD-10-CM | POA: Diagnosis not present

## 2019-01-07 DIAGNOSIS — J438 Other emphysema: Secondary | ICD-10-CM | POA: Diagnosis not present

## 2019-01-07 NOTE — Assessment & Plan Note (Signed)
Plan: Continue CPAP therapy as prescribed Follow-up with our office in 6 months

## 2019-01-07 NOTE — Assessment & Plan Note (Signed)
Plan: Continue to remain physically active Follow-up with Dr. Chase Caller in 6 months Flu vaccine in the fall

## 2019-01-07 NOTE — Patient Instructions (Addendum)
You were seen today by Lauraine Rinne, NP  for:  1. ILD (interstitial lung disease) ([place])  Remain active  Flu vaccine and follow-up with primary care provider Follow-up with our office in 6 months or sooner if breathing worsens  2. Obstructive sleep apnea  We recommend that you continue using your CPAP daily >>>Keep up the hard work using your device >>> Goal should be wearing this for the entire night that you are sleeping, at least 4 to 6 hours  Remember:  . Do not drive or operate heavy machinery if tired or drowsy.  . Please notify the supply company and office if you are unable to use your device regularly due to missing supplies or machine being broken.  . Work on maintaining a healthy weight and following your recommended nutrition plan  . Maintain proper daily exercise and movement  . Maintaining proper use of your device can also help improve management of other chronic illnesses such as: Blood pressure, blood sugars, and weight management.   BiPAP/ CPAP Cleaning:  >>>Clean weekly, with Dawn soap, and bottle brush.  Set up to air dry.   3. 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[name])  Continue Symbicort 160 >>> 2 puffs in the morning right when you wake up, rinse out your mouth after use, 12 hours later 2 puffs, rinse after use >>> Take this daily, no matter what >>> This is not a rescue inhaler     Follow Up:    Return in about 6 months (around 07/10/2019), or if symptoms worsen or fail to improve, for Follow up with Dr. Purnell Shoemaker.   Please do your part to reduce the spread of COVID-19:      Reduce your risk of any infection  and COVID19 by using the similar precautions used for avoiding the common cold or flu:  Marland Kitchen Wash your hands often with soap and warm water for at least 20 seconds.  If soap and water are not readily available, use an alcohol-based hand sanitizer with at least 60% alcohol.  . If coughing or sneezing, cover your mouth and nose by coughing or sneezing into the  elbow areas of your shirt or coat, into a tissue or into your sleeve (not your hands). Langley Gauss A MASK when in public  . Avoid shaking hands with others and consider head nods or verbal greetings only. . Avoid touching your eyes, nose, or mouth with unwashed hands.  . Avoid close contact with people who are sick. . Avoid places or events with large numbers of people in one location, like concerts or sporting events. . If you have some symptoms but not all symptoms, continue to monitor at home and seek medical attention if your symptoms worsen. . If you are having a medical emergency, call 911.   [place] / e-Visit: [email or web address]         [facility]: [facility]: [phone]                   [facility]: [phone]     It is flu season:   >>> Best ways to protect herself from the flu: Receive the yearly flu vaccine, practice good hand hygiene washing with soap and also using hand sanitizer when available, eat a nutritious meals, get adequate rest, hydrate appropriately   Please contact the office if your symptoms worsen or you have concerns that you are not improving.  Thank you for choosing [facility] for your healthcare, and for allowing [place] to partner with you on your healthcare journey. I am thankful to be able to provide care to you today.   [name] FNP-C

## 2019-01-07 NOTE — Assessment & Plan Note (Signed)
Plan: Continue Symbicort 160 Remain active Flu vaccine in the fall Follow-up with our office in 6 months

## 2019-01-08 ENCOUNTER — Ambulatory Visit (INDEPENDENT_AMBULATORY_CARE_PROVIDER_SITE_OTHER): Payer: Medicare HMO | Admitting: *Deleted

## 2019-01-08 DIAGNOSIS — I472 Ventricular tachycardia, unspecified: Secondary | ICD-10-CM

## 2019-01-08 LAB — CUP PACEART REMOTE DEVICE CHECK
Battery Remaining Longevity: 18 mo
Battery Remaining Percentage: 25 %
Brady Statistic RA Percent Paced: 27 %
Brady Statistic RV Percent Paced: 0 %
Date Time Interrogation Session: 20200819082200
HighPow Impedance: 55 Ohm
Implantable Lead Implant Date: 20050922
Implantable Lead Implant Date: 20050922
Implantable Lead Location: 753859
Implantable Lead Location: 753860
Implantable Lead Model: 158
Implantable Lead Model: 5076
Implantable Lead Serial Number: 156891
Implantable Pulse Generator Implant Date: 20100817
Lead Channel Impedance Value: 464 Ohm
Lead Channel Impedance Value: 517 Ohm
Lead Channel Pacing Threshold Amplitude: 0.6 V
Lead Channel Pacing Threshold Amplitude: 1 V
Lead Channel Pacing Threshold Pulse Width: 0.4 ms
Lead Channel Pacing Threshold Pulse Width: 0.4 ms
Lead Channel Setting Pacing Amplitude: 2 V
Lead Channel Setting Pacing Amplitude: 2.4 V
Lead Channel Setting Pacing Pulse Width: 0.4 ms
Lead Channel Setting Sensing Sensitivity: 0.6 mV
Pulse Gen Serial Number: 141895

## 2019-01-10 ENCOUNTER — Telehealth: Payer: Self-pay | Admitting: Internal Medicine

## 2019-01-10 NOTE — Telephone Encounter (Signed)
Spoke with pt's wife. Pt has been scheduled for his flu shot on 01/15/2019 at 0915. Nothing further was needed.

## 2019-01-15 ENCOUNTER — Ambulatory Visit (INDEPENDENT_AMBULATORY_CARE_PROVIDER_SITE_OTHER): Payer: Medicare HMO

## 2019-01-15 ENCOUNTER — Other Ambulatory Visit: Payer: Self-pay

## 2019-01-15 ENCOUNTER — Ambulatory Visit: Payer: Medicare HMO

## 2019-01-15 DIAGNOSIS — Z23 Encounter for immunization: Secondary | ICD-10-CM

## 2019-01-15 NOTE — Progress Notes (Signed)
Patient came to office today requesting high dose flu vaccine.  Flu vaccine info given.  High dose flu vaccine administered. Nothing further.

## 2019-01-16 ENCOUNTER — Encounter: Payer: Self-pay | Admitting: Cardiology

## 2019-01-16 DIAGNOSIS — R1310 Dysphagia, unspecified: Secondary | ICD-10-CM | POA: Diagnosis not present

## 2019-01-16 DIAGNOSIS — E119 Type 2 diabetes mellitus without complications: Secondary | ICD-10-CM | POA: Diagnosis not present

## 2019-01-16 NOTE — Progress Notes (Signed)
Remote ICD transmission.   

## 2019-02-14 IMAGING — CR DG CERVICAL SPINE 2 OR 3 VIEWS
3 series · 3 of 3 positions shown · non-contrast
Comparison: None.

CLINICAL DATA: RIGHT-sided neck and arm pain for 1 month. No known
injury.

EXAM:
CERVICAL SPINE - 2-3 VIEW

[w c-spine lat]
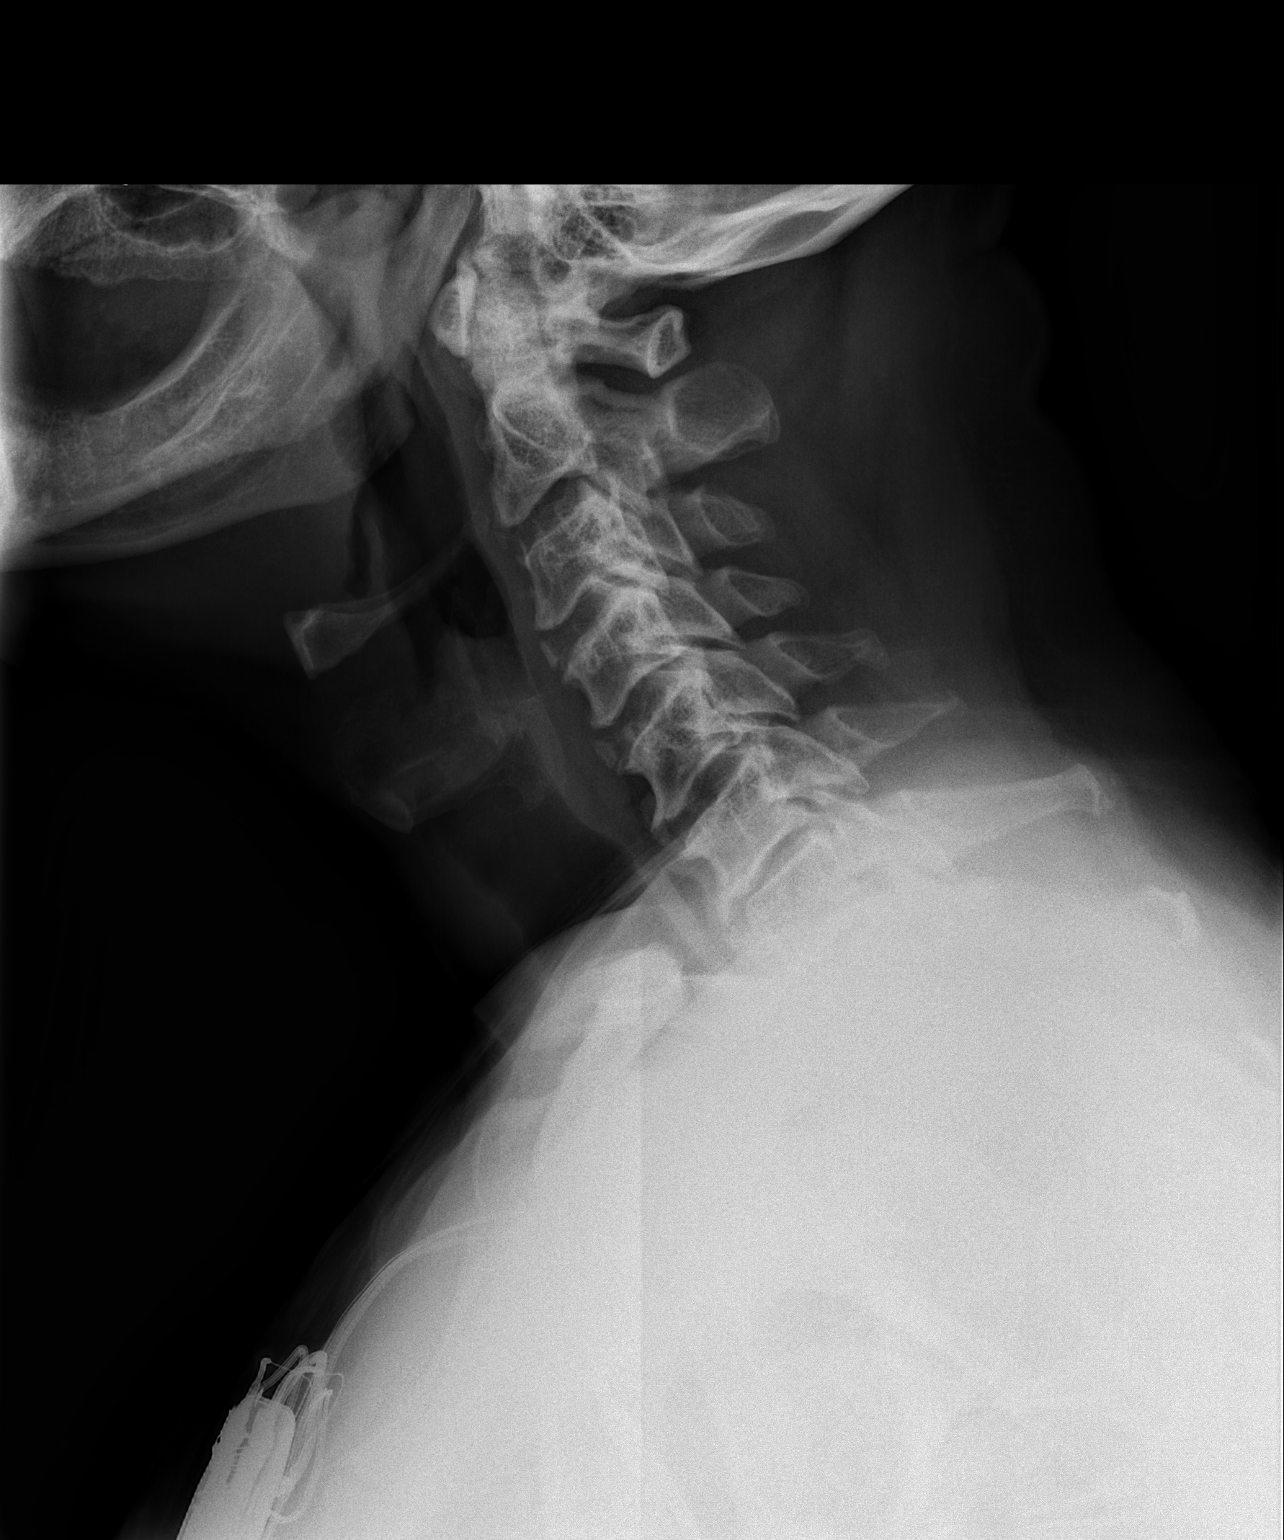

[w c-spine a.p. *]
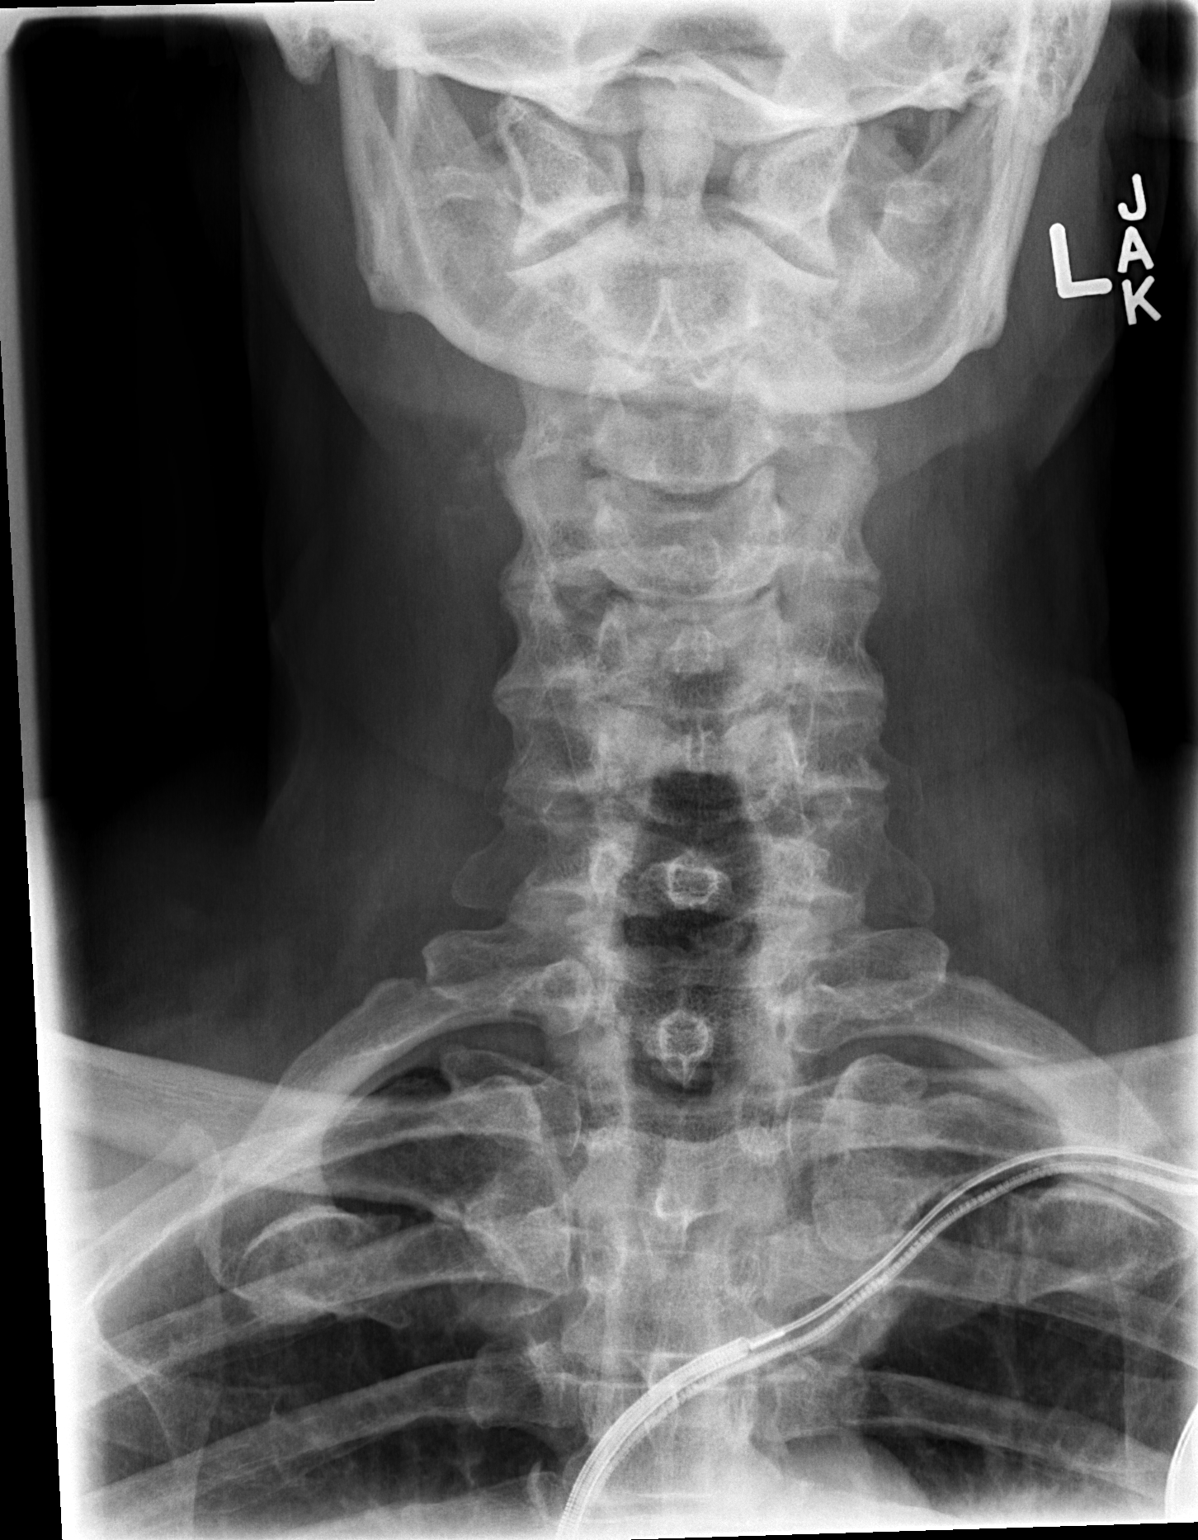

[w c-spine odontoid *]
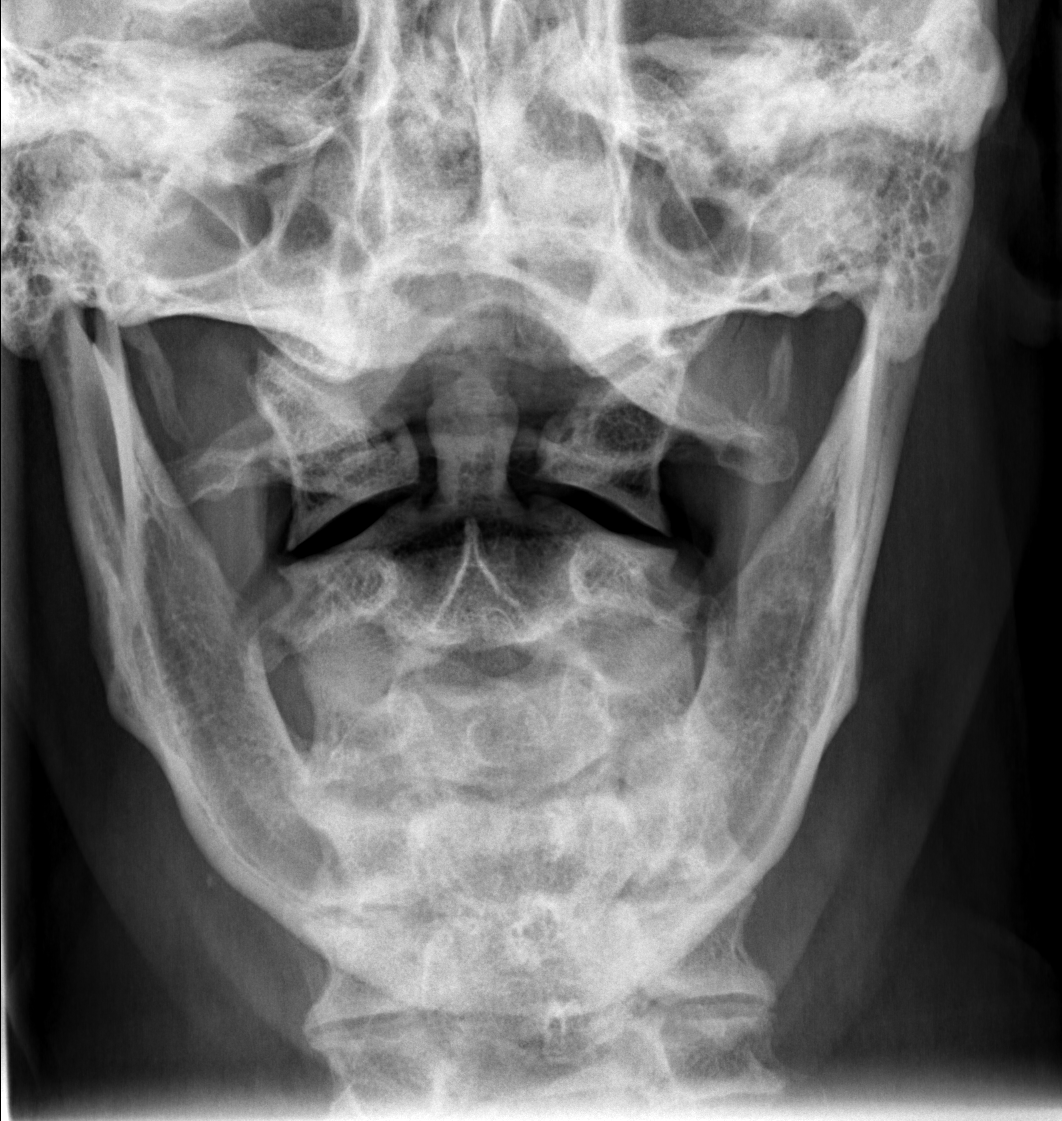

[3 of 3 positions shown; findings below may reference images not displayed]

FINDINGS: There is no evidence of cervical spine fracture or prevertebral soft
tissue swelling. Alignment is normal. Disc space narrowing is
observed at C6-C7. Pacemaker. No other significant bone
abnormalities are identified.
IMPRESSION: Cervical spondylosis C6-C7.

## 2019-02-17 ENCOUNTER — Other Ambulatory Visit: Payer: Self-pay | Admitting: *Deleted

## 2019-02-17 ENCOUNTER — Encounter: Payer: Self-pay | Admitting: Internal Medicine

## 2019-02-17 ENCOUNTER — Other Ambulatory Visit: Payer: Self-pay

## 2019-02-17 ENCOUNTER — Ambulatory Visit: Payer: Medicare HMO | Admitting: Pulmonary Disease

## 2019-02-17 ENCOUNTER — Ambulatory Visit (INDEPENDENT_AMBULATORY_CARE_PROVIDER_SITE_OTHER): Payer: Medicare HMO | Admitting: Internal Medicine

## 2019-02-17 DIAGNOSIS — R05 Cough: Secondary | ICD-10-CM | POA: Diagnosis not present

## 2019-02-17 DIAGNOSIS — J441 Chronic obstructive pulmonary disease with (acute) exacerbation: Secondary | ICD-10-CM

## 2019-02-17 DIAGNOSIS — Z20822 Contact with and (suspected) exposure to covid-19: Secondary | ICD-10-CM

## 2019-02-17 DIAGNOSIS — R059 Cough, unspecified: Secondary | ICD-10-CM

## 2019-02-17 MED ORDER — DOXYCYCLINE HYCLATE 100 MG PO TABS: 100.0000 mg | ORAL_TABLET | Freq: Two times a day (BID) | ORAL | 0 refills | Status: DC

## 2019-02-17 MED ORDER — PREDNISONE 10 MG PO TABS: ORAL_TABLET | ORAL | 0 refills | Status: DC

## 2019-02-17 NOTE — Addendum Note (Signed)
Addended by: Len Blalock on: 02/17/2019 09:51 AM   Modules accepted: Orders

## 2019-02-17 NOTE — Progress Notes (Signed)
IOV 11/01/2017  , Chief Complaint  Patient presents with  . Follow-up    [name], SOB, or CP.   [name] , 73 y.o. , with dob [birth year] and male ,Not Hispanic or Latino from [place] [address] - presents to pulm clinic for emphysema with ? ILD. Also has underlyig OSA on CPAP followed by Dr [name].  He was last seen by Dr. Ashok [name] the fall 2018 after which Dr. Ashok [name] left the practice.  He was then most recently followed by [name] our nurse practitioner who ordered a CT scan of the chest.  Patient is here for follow-up.  His only abnormality is isolated reduction diffusion capacity November 2018 64%.  His latest CT scan of the chest June 2019 shows only emphysema without any evidence of ILD.  At this point in time he is on Symbicort and he feels stable.  He is more hampered by his back pain issue and he is stating that he needs to get to physical therapy pretty soon.     IMPRESSION: 1. No evidence of interstitial lung disease. Previously described subtle findings at the lung bases are absent on the prone sequence, compatible with hypoventilatory change. 2. Mild paraseptal emphysema with mild diffuse bronchial wall thickening, suggesting COPD. 3. One vessel coronary atherosclerosis. 4. Stable small pericardial effusion/thickening.  Aortic Atherosclerosis (ICD10-I70.0) and Emphysema (ICD10-J43.9).  xxxxxxxxxxxxxxxxxxxxxxxxxxxxxxxxxxxxxxxxxxxxxxxxx   Chief Complaint  Patient presents with  . Follow-up    Reports no issue with his cpap. States his breathing has been at his baseline. Currenlty using Symbicort daily and Albuterol prn.     01/07/2019  - Visit    Referring provider: Dorothyann Peng, NP  HPI:  73 year old male former smoker with ILD, mild COPD with paraseptal emphysema with blebs, obstructive sleep  apnea with CPAP.  PMH: Afib, CHF (on entresto), CAD, GERD Smoker/ Smoking History: Former Smoker, quit 1986, 46 pack years. Maintenance:  Symbicort 160 Pt of: Dr. Chase [name], Dr [name] for sleep    73 year old male former smoker followed in our office for emphysema, interstitial lung disease and severe obstructive sleep apnea.  Patient reports has been doing well since his last office visit.  Patient is maintained on Symbicort 160.  He has not had to use his rescue inhaler.  Patient remains active daily.  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  Patient has been compliant with using his CPAP.  CPAP compliance report confirms this.  See CPAP compliance for listed below:  12/08/2018-01/06/2019- 26 out of last 30 days use, all 26 those days greater than 4 hours, average usage 7 hours and 16 minutes, APAP setting 10-15, AHI 2.8  Patient is followed by Dr. Chase [name] for pulmonary.  He is followed by Dr. Ander [name] for sleep.  MMRC - Breathlessness Score 1 - I get short of breath when hurrying on level ground or walking up a slight hill     Tests:   06/19/2018-pulmonary function test- FVC 3.66 (99% predicted), postbronchodilator ratio 72, postbronchodilator FEV1 2.64 (95% predicted), no significant bronchodilator response, DLCO 70  POLYSOMNOGRAM/CPAP TITRATION 02/11/16: Optimal CPAP pressure 13 cm H2O. No central apneas noted. PVCs with sinus rhythm. Severe periodic leg movements were noted. Lowest saturation 92%. 08/24/05:  Lowest saturation 79% &Baseline saturation 97%. Periodic limb movement index 0.  AHI 84 events/hour. Respiratory disturbances not related to sleep stage. Patient slept exclusively in supine position.   Imaging:  HRCT CHEST W/O 01/04/16 :Mild diffuse bronchial wall thickening with mild paraseptal emphysema with some subpleural bleb formation. 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 No pleural effusion  or thickening. Very small pericardial fluid versus pericardial thickening. No pathologic mediastinal adenopathy. No evidence of bronchiectasis or honeycomb changes. No opacities. Thickened esophageal wall.  Labs:   01/27/16 Alpha-1 antitrypsin: MM (132) CRP: 0.1  ESR: 12 ANA: Negative Anti-Jo1:  <0.2 Centromere Ab Screen:  <0.2 Anti-CCP:  <16 DS DNA Ab:  <1 RNP Ab:  0.5 SSA:  <0.2 SSB:  <0.2 SCL-70:  <0.2 Smith Ab:  <0.2 Chromatin Ab:  <0.2 Hypersensitivity Pneumonitis Panel:  Negative   FENO:  No results found for: NITRICOXIDE  PFT: PFT Results Latest Ref Rng & Units 06/19/2018 04/10/2017 11/08/2016 08/08/2016 05/02/2016 12/29/2015 10/20/2015  FVC-Pre L 3.66 3.65 3.79 3.76 3.67 3.52 3.63  FVC-Predicted Pre % 99 95 98 97 95 91 92  FVC-Post L 3.68 3.68 - - - 3.29 3.69  FVC-Predicted Post % 100 96 - - - 85 93  Pre FEV1/FVC % % 75 77 67 77 75 76 77  Post FEV1/FCV % % 72 80 - - - 81 80  FEV1-Pre L 2.75 2.81 2.53 2.89 2.75 2.67 2.81  FEV1-Predicted Pre % 99 97 87 99 94 91 94  FEV1-Post L 2.64 2.93 - - - 2.68 2.94  DLCO UNC% % 70 62 61 57 56 53 71  DLCO COR %Predicted % 85 80 76 73 74 76 98  TLC L - 5.98 - - - 5.99 6.15  TLC % Predicted % - 85 - - - 85 86  RV % Predicted % - 92 - - - 97 97    OV 02/17/2019 - telephone visit - risks, benefits, limitations explained  Subjective:  [name], male , DOB: 1945-10-02 , age 73 y.o. , MRN: [id number] , ADDRESS: [address] [place] [address]   02/17/2019 -   Chief Complaint  Patient presents with  . televisit    pt c/o runny nose, nonprod cough X2 days.  coughing until vomiting. denies fever, mucus production.  requesting hycodan.      HPI [name] 73 y.o. -wife took call. Says due to weather change last weekend. Cough since last night, keeping him awake, rates it as mild. Sputum +. Yellow + . Small amoutn. No loss of taste. No loss of smell. No fever. No hemoptysis. No myalgia. Chest hurts  from coughing. No wheezing. No sick contact. Social distancing at home. Wife says this time he gets sick. Usually hycodan called in the past. Wife works as a Marine scientist but since last year not worked. No clusters. All wearing masks at home.  Not been to restaiurants.. Wife sick 3 weeks ago but not covid tested due to low prop. Not fully compliant with symbicort    ROS - per HPI     has a past medical history of AICD (automatic cardioverter/defibrillator) present, Atrial fibrillation ([place]), CHF (congestive heart failure) ([place]), Chronic systolic dysfunction of left ventricle, CKD (chronic kidney disease), stage III ([place]) (02/22/2011), COPD (chronic obstructive pulmonary disease) ([place]), Depression, DJD (degenerative joint disease), GERD (gastroesophageal reflux disease), HLD (hyperlipidemia), Hypertension, Ischemic cardiomyopathy, Memory loss, Myocardial infarction ([place]) (1986), OSA on CPAP, Pneumonia (2008), Pneumothorax on left, Seizures ([place]), Status post dilation of esophageal narrowing, Stroke (  [place]) (2009), Type 2 diabetes mellitus ([place]), and Ventricular tachycardia ([place]).   reports that he quit smoking about 34 years ago. His smoking use included cigarettes. He started smoking about 57 years ago. He has a 46.00 pack-year smoking history. He has never used smokeless tobacco.  Past Surgical History:  Procedure Laterality Date  . ABDOMINAL HERNIA REPAIR    . CORONARY ANGIOPLASTY  01/03/1985   with pci  to circumflex  . HAND TENDON SURGERY Left ~ 2014   "related to CVA'  . HERNIA REPAIR    . ICD Implantation    . LAPAROSCOPIC APPENDECTOMY N/A 12/24/2017   Procedure: APPENDECTOMY LAPAROSCOPIC;  Surgeon: Fanny Skates, MD;  Location: [facility];  Service: General;  Laterality: N/A;  . RIGHT/LEFT HEART CATH AND CORONARY ANGIOGRAPHY N/A 11/16/2016   Procedure: Right/Left Heart Cath and Coronary Angiography;  Surgeon: Jolaine Artist, MD;  Location: [facility];  Service: Cardiovascular;   Laterality: N/A;  . V TACH ABLATION N/A 12/28/2016   Procedure: Stephanie Coup Ablation;  Surgeon: [name], MD;  Location: [facility];  Service: Cardiovascular;  Laterality: N/A;  Stephanie Coup ABLATION N/A 06/04/2018   Procedure: Stephanie Coup ABLATION;  Surgeon: [name], MD;  Location: [facility];  Service: Cardiovascular;  Laterality: N/A;  . vocal cord surgery  2011   vocal cord stimulator     Allergies  Allergen Reactions  . Aricept [Donepezil Hcl] Other (See Comments)    Worsens renal function  . Codeine Hives  . Nexium [Esomeprazole] Other (See Comments)    Severely worsens renal function  . Omeprazole Hives  . Pantoprazole Sodium Other (See Comments)    Renal failure  . Tramadol Nausea And Vomiting    Immunization History  Administered Date(s) Administered  . Fluad Quad(high Dose 65+) 01/15/2019  . Influenza Split 02/22/2011  . Influenza Whole 04/12/2007, 02/24/2008, 02/18/2009, 02/18/2010  . Influenza, High Dose Seasonal PF 02/09/2016, 03/13/2017, 02/08/2018  . Influenza,inj,Quad PF,6+ Mos 03/09/2017  . Influenza-Unspecified 02/06/2012  . Pneumococcal Conjugate-13 01/27/2016  . Pneumococcal Polysaccharide-23 02/24/2008, 02/08/2018    Family History  Problem Relation Age of Onset  . Heart disease Father   . Hyperlipidemia Father   . Hypertension Father   . Heart attack Father   . Kidney disease Father   . Diabetes Father   . Diabetes Sister   . Heart disease Mother   . Diabetes Mother   . Emphysema Mother   . Parkinsonism Mother   . COPD Brother   . Diabetes Brother   . COPD Brother   . Heart disease Brother   . Heart disease Brother   . Sudden death Neg Hx      Current Outpatient Medications:  .  albuterol (PROVENTIL HFA;VENTOLIN HFA) 108 (90 Base) MCG/ACT inhaler, Inhale 1-2 puffs into the lungs every 4 (four) hours as needed for wheezing or shortness of breath (or cough)., Disp: 1 Inhaler, Rfl: 5 .  apixaban (ELIQUIS) 5 MG TABS tablet, Take 1  tablet (5 mg total) by mouth 2 (two) times daily., Disp: 180 tablet, Rfl: 3 .  Blood Glucose Monitoring Suppl (ONETOUCH VERIO FLEX SYSTEM) w/Device KIT, USE TO TEST BLOOD GLUCOSE THREE TIMES DAILY, Disp: 1 kit, Rfl: 0 .  budesonide-formoterol (SYMBICORT) 160-4.5 MCG/ACT inhaler, Inhale 2 puffs into the lungs every 12 (twelve) hours., Disp: 1 Inhaler, Rfl: 6 .  Continuous Blood Gluc Receiver (FREESTYLE LIBRE 14 DAY READER) DEVI, 1 Device by Does not apply route every 14 (  fourteen) days., Disp: 24 Device, Rfl: 3 .  Continuous Blood Gluc Sensor (FREESTYLE LIBRE 14 DAY SENSOR) MISC, 1 Device by Does not apply route every 14 (fourteen) days., Disp: 24 each, Rfl: 3 .  EASY COMFORT PEN NEEDLES 31G X 5 MM MISC, USE DAILY AT BEDTIME., Disp: 100 each, Rfl: 3 .  fenofibrate 160 MG tablet, TAKE 1 TABLET (160 MG TOTAL) BY MOUTH EVERY EVENING., Disp: 90 tablet, Rfl: 3 .  finasteride (PROSCAR) 5 MG tablet, TAKE ONE TABLET BY MOUTH ONCE DAILY, Disp: 30 tablet, Rfl: 2 .  furosemide (LASIX) 20 MG tablet, Take 1 tablet (20 mg) 3 times a week. Monday, Wednesday, and Friday, Disp: 45 tablet, Rfl: 3 .  isosorbide-hydrALAZINE (BIDIL) 20-37.5 MG tablet, Take 2 tablets by mouth 2 (two) times daily., Disp: 360 tablet, Rfl: 2 .  Lacosamide (VIMPAT) 100 MG TABS, Take 1.5 tablet in the morning and 1 tablet in the evening, Disp: 180 tablet, Rfl: 3 .  Lacosamide 100 MG TABS, Take 1 tablet (100 mg total) by mouth 2 (two) times a day., Disp: 180 tablet, Rfl: 3 .  LANTUS SOLOSTAR 100 UNIT/ML Solostar Pen, INJECT 10 UNITS INTO THE SKIN AT BEDTIME.  LAST FOR 150 DAYS , Disp: 15 mL, Rfl: 0 .  levocetirizine (XYZAL) 5 MG tablet, TAKE 1 TABLET (5 MG TOTAL) BY MOUTH EVERY EVENING., Disp: 90 tablet, Rfl: 3 .  memantine (NAMENDA) 10 MG tablet, Take 1 tablet every night for 2 weeks, then increase to 1 tab twice a day, Disp: 60 tablet, Rfl: 5 .  metoprolol succinate (TOPROL-XL) 25 MG 24 hr tablet, TAKE 1 TABLET (25 MG TOTAL) BY MOUTH EVERY  MORNING., Disp: 90 tablet, Rfl: 3 .  metoprolol succinate (TOPROL-XL) 50 MG 24 hr tablet, TAKE 1 TABLET (50 MG TOTAL) BY MOUTH EVERY EVENING. TAKE WITH OR IMMEDIATELY FOLLOWING A MEAL., Disp: 90 tablet, Rfl: 2 .  mirabegron ER (MYRBETRIQ) 25 MG TB24 tablet, Take 25 mg by mouth daily. , Disp: , Rfl:  .  nitroGLYCERIN (NITROSTAT) 0.4 MG SL tablet, PLACE 1 TABLET (0.4 MG TOTAL) UNDER THE TONGUE EVERY 5 (FIVE) MINUTES FOR 3 DOSES AS NEEDED FOR CHEST PAIN., Disp: 25 tablet, Rfl: 3 .  ONETOUCH VERIO test strip, Use to test blood glucose TID.  Medically Necessary.  Use Medicare Part B., Disp: 300 each, Rfl: 3 .  Polyethyl Glycol-Propyl Glycol (SYSTANE) 0.4-0.3 % SOLN, Place 1 drop into both eyes daily as needed (for dry eyes)., Disp: , Rfl:  .  ranolazine (RANEXA) 500 MG 12 hr tablet, Take 1 tablet (500 mg total) by mouth daily., Disp: 90 tablet, Rfl: 2 .  rosuvastatin (CRESTOR) 40 MG tablet, Take 1 tablet (40 mg total) by mouth every evening., Disp: 90 tablet, Rfl: 3 .  sacubitril-valsartan (ENTRESTO) 97-103 MG, Take 1 tablet by mouth 2 (two) times daily., Disp: 180 tablet, Rfl: 3 .  sotalol (BETAPACE) 80 MG tablet, TAKE 1 TABLET (80 MG TOTAL) BY MOUTH DAILY., Disp: 90 tablet, Rfl: 1 .  tamsulosin (FLOMAX) 0.4 MG CAPS capsule, Take 0.4 mg by mouth at bedtime. , Disp: , Rfl:       Objective:   There were no vitals filed for this visit.  Estimated body mass index is 28.18 kg/m as calculated from the following:   Height as of 01/07/19: '5\' 9"'  (1.753 m).   Weight as of 01/07/19: 190 lb 12.8 oz (86.5 kg).  '@WEIGHTCHANGE' @  There were no vitals filed for this visit.    Physical  Exam Not done due to exam        Assessment:       ICD-10-CM   1. COPD with acute exacerbation ([place])  J44.1        Plan:     Patient Instructions     ICD-10-CM   1. COPD with acute exacerbation ([place])  J44.1    Likely due to weather change and symbicort non-compliance Unlikely due to covid-19 but needs to be  ruled out  Plan  - Take doxycycline 141m po twice daily x 5 days; take after meals and avoid sunlight - Please take prednisone 40 mg x1 day, then 30 mg x1 day, then 20 mg x1 day, then 10 mg x1 day, and then 5 mg x1 day and stop - hold off hycodan for now  - do covid-19 testing 02/17/2019 - will call with results  Followup 4 - 6 weeks with Dr RChase [name]    (> 50% of this 15 min visit spent in face to face counseling or/and coordination of care by this undersigned MD - Dr MBrand Males This includes one or more of the following documented above: discussion of test results, diagnostic or treatment recommendations, prognosis, risks and benefits of management options, instructions, education, compliance or risk-factor reduction)   SIGNATURE    Dr. MBrand Males M.D., F.C.C.P,  Pulmonary and Critical Care Medicine Staff Physician, COoltewahDirector - Interstitial Lung Disease  Program  [facility] [place] [id number] Pager: [phone] If no answer or between  15:00h - 7:00h: call [phone] Telephone: [phone]  9:19 AM 02/17/2019

## 2019-02-17 NOTE — Patient Instructions (Addendum)
ICD-10-CM   1. COPD with acute exacerbation ([place])  J44.1    Likely due to weather change and symbicort non-compliance Unlikely due to covid-19 but needs to be ruled out  Plan  - Take doxycycline 100mg  po twice daily x 5 days; take after meals and avoid sunlight - Please take prednisone 40 mg x1 day, then 30 mg x1 day, then 20 mg x1 day, then 10 mg x1 day, and then 5 mg x1 day and stop - hold off hycodan for now  - do covid-19 testing 02/17/2019 - will call with results  Followup 4 - 6 weeks with Dr Chase Caller

## 2019-02-18 ENCOUNTER — Telehealth: Payer: Medicare HMO | Admitting: Adult Health

## 2019-02-20 LAB — NOVEL CORONAVIRUS, NAA: SARS-CoV-2, NAA: NOT DETECTED

## 2019-02-21 ENCOUNTER — Telehealth (INDEPENDENT_AMBULATORY_CARE_PROVIDER_SITE_OTHER): Payer: Medicare HMO | Admitting: Internal Medicine

## 2019-02-21 ENCOUNTER — Encounter: Payer: Self-pay | Admitting: Internal Medicine

## 2019-02-21 VITALS — BP 120/81 | Ht 69.0 in | Wt 192.0 lb

## 2019-02-21 DIAGNOSIS — I5022 Chronic systolic (congestive) heart failure: Secondary | ICD-10-CM

## 2019-02-21 DIAGNOSIS — I472 Ventricular tachycardia, unspecified: Secondary | ICD-10-CM

## 2019-02-21 DIAGNOSIS — I480 Paroxysmal atrial fibrillation: Secondary | ICD-10-CM

## 2019-02-21 DIAGNOSIS — I2510 Atherosclerotic heart disease of native coronary artery without angina pectoris: Secondary | ICD-10-CM | POA: Diagnosis not present

## 2019-02-21 DIAGNOSIS — I255 Ischemic cardiomyopathy: Secondary | ICD-10-CM
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
## 2019-02-25 ENCOUNTER — Telehealth (INDEPENDENT_AMBULATORY_CARE_PROVIDER_SITE_OTHER): Payer: Medicare HMO | Admitting: Adult Health

## 2019-02-25 ENCOUNTER — Other Ambulatory Visit: Payer: Self-pay

## 2019-02-25 DIAGNOSIS — E1165 Type 2 diabetes mellitus with hyperglycemia: Secondary | ICD-10-CM
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
## 2019-03-06 DIAGNOSIS — G4733 Obstructive sleep apnea (adult) (pediatric): Secondary | ICD-10-CM | POA: Diagnosis not present

## 2019-03-13 DIAGNOSIS — I255 Ischemic cardiomyopathy: Secondary | ICD-10-CM | POA: Diagnosis not present

## 2019-03-13 DIAGNOSIS — I129 Hypertensive chronic kidney disease with stage 1 through stage 4 chronic kidney disease, or unspecified chronic kidney disease: Secondary | ICD-10-CM | POA: Diagnosis not present

## 2019-03-13 DIAGNOSIS — N183 Chronic kidney disease, stage 3 unspecified: Secondary | ICD-10-CM | POA: Diagnosis not present

## 2019-03-17 ENCOUNTER — Encounter: Payer: Self-pay | Admitting: Family Medicine

## 2019-03-17 ENCOUNTER — Other Ambulatory Visit: Payer: Self-pay

## 2019-03-17 ENCOUNTER — Ambulatory Visit (INDEPENDENT_AMBULATORY_CARE_PROVIDER_SITE_OTHER): Payer: Medicare HMO | Admitting: Family Medicine

## 2019-03-17 ENCOUNTER — Ambulatory Visit (INDEPENDENT_AMBULATORY_CARE_PROVIDER_SITE_OTHER): Payer: Medicare HMO

## 2019-03-17 VITALS — BP 102/60 | HR 65 | Temp 98.1°F | Ht 69.0 in | Wt 191.2 lb

## 2019-03-17 DIAGNOSIS — M25552 Pain in left hip: Secondary | ICD-10-CM | POA: Diagnosis not present

## 2019-03-17 DIAGNOSIS — S79912A Unspecified injury of left hip, initial encounter: Secondary | ICD-10-CM | POA: Diagnosis not present
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
## 2019-03-17 NOTE — Patient Instructions (Signed)
Continue with Tylenol as needed.  We will call you with X-ray over read if anything concerning.  Continue with topical sports cream.

## 2019-03-18 ENCOUNTER — Ambulatory Visit (INDEPENDENT_AMBULATORY_CARE_PROVIDER_SITE_OTHER): Payer: Medicare HMO | Admitting: Internal Medicine

## 2019-03-18 DIAGNOSIS — J439 Emphysema, unspecified: Secondary | ICD-10-CM | POA: Diagnosis not present

## 2019-03-18 DIAGNOSIS — J449 Chronic obstructive pulmonary disease, unspecified: Secondary | ICD-10-CM

## 2019-03-18 MED ORDER — SPIRIVA RESPIMAT 2.5 MCG/ACT IN AERS: 2.0000 | INHALATION_SPRAY | Freq: Every day | RESPIRATORY_TRACT | 5 refills | Status: DC

## 2019-03-18 NOTE — Progress Notes (Signed)
IOV 11/01/2017  , Chief Complaint  Patient presents with  . Follow-up    [name], SOB, or CP.   [name] , 73 y.o. , with dob December 05, 1945 and male ,Not Hispanic or Latino from [place] [address] - presents to pulm clinic for emphysema with ? ILD. Also has underlyig OSA on CPAP followed by Dr Halford Chessman.  He was last seen by Dr. Ashok Cordia the fall 2018 after which Dr. Ashok Cordia left the practice.  He was then most recently followed by Judson Roch our nurse practitioner who ordered a CT scan of the chest.  Patient is here for follow-up.  His only abnormality is isolated reduction diffusion capacity November 2018 64%.  His latest CT scan of the chest June 2019 shows only emphysema without any evidence of ILD.  At this point in time he is on Symbicort and he feels stable.  He is more hampered by his back pain issue and he is stating that he needs to get to physical therapy pretty soon.     IMPRESSION: 1. No evidence of interstitial lung disease. Previously described subtle findings at the lung bases are absent on the prone sequence, compatible with hypoventilatory change. 2. Mild paraseptal emphysema with mild diffuse bronchial wall thickening, suggesting COPD. 3. One vessel coronary atherosclerosis. 4. Stable small pericardial effusion/thickening.  Aortic Atherosclerosis (ICD10-I70.0) and Emphysema (ICD10-J43.9).  xxxxxxxxxxxxxxxxxxxxxxxxxxxxxxxxxxxxxxxxxxxxxxxxx   Chief Complaint  Patient presents with  . Follow-up    Reports no issue with his cpap. States his breathing has been at his baseline. Currenlty using Symbicort daily and Albuterol prn.     01/07/2019  - Visit    Referring provider: Dorothyann Peng, NP  HPI:  73 year old male former smoker with ILD, mild COPD with paraseptal emphysema with blebs, obstructive sleep  apnea with CPAP.  PMH: Afib, CHF (on entresto), CAD, GERD Smoker/ Smoking History: Former Smoker, quit 1986, 46 pack years. Maintenance:  Symbicort 160 Pt of: Dr. Chase Caller, Dr Ander Slade for sleep    73 year old male former smoker followed in our office for emphysema, interstitial lung disease and severe obstructive sleep apnea.  Patient reports has been doing well since his last office visit.  Patient is maintained on Symbicort 160.  He has not had to use his rescue inhaler.  Patient remains active daily.  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  Patient has been compliant with using his CPAP.  CPAP compliance report confirms this.  See CPAP compliance for listed below:  12/08/2018-01/06/2019- 26 out of last 30 days use, all 26 those days greater than 4 hours, average usage 7 hours and 16 minutes, APAP setting 10-15, AHI 2.8  Patient is followed by Dr. Chase Caller for pulmonary.  He is followed by Dr. Ander Slade for sleep.  MMRC - Breathlessness Score 1 - I get short of breath when hurrying on level ground or walking up a slight hill     Tests:   06/19/2018-pulmonary function test- FVC 3.66 (99% predicted), postbronchodilator ratio 72, postbronchodilator FEV1 2.64 (95% predicted), no significant bronchodilator response, DLCO 70  POLYSOMNOGRAM/CPAP TITRATION 02/11/16: Optimal CPAP pressure 13 cm H2O. No central apneas noted. PVCs with sinus rhythm. Severe periodic leg movements were noted. Lowest saturation 92%. 08/24/05:  Lowest saturation 79% &Baseline saturation 97%. Periodic limb movement index  0. AHI 84 events/hour. Respiratory disturbances not related to sleep stage. Patient slept exclusively in supine position.   Imaging:  HRCT CHEST W/O 01/04/16 :Mild diffuse bronchial wall thickening with mild paraseptal emphysema with some subpleural bleb formation. 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 No pleural effusion  or thickening. Very small pericardial fluid versus pericardial thickening. No pathologic mediastinal adenopathy. No evidence of bronchiectasis or honeycomb changes. No opacities. Thickened esophageal wall.  Labs:   01/27/16 Alpha-1 antitrypsin: MM (132) CRP: 0.1  ESR: 12 ANA: Negative Anti-Jo1:  <0.2 Centromere Ab Screen:  <0.2 Anti-CCP:  <16 DS DNA Ab:  <1 RNP Ab:  0.5 SSA:  <0.2 SSB:  <0.2 SCL-70:  <0.2 Smith Ab:  <0.2 Chromatin Ab:  <0.2 Hypersensitivity Pneumonitis Panel:  Negative   FENO:  No results found for: NITRICOXIDE  PFT: PFT Results Latest Ref Rng & Units 06/19/2018 04/10/2017 11/08/2016 08/08/2016 05/02/2016 12/29/2015 10/20/2015  FVC-Pre L 3.66 3.65 3.79 3.76 3.67 3.52 3.63  FVC-Predicted Pre % 99 95 98 97 95 91 92  FVC-Post L 3.68 3.68 - - - 3.29 3.69  FVC-Predicted Post % 100 96 - - - 85 93  Pre FEV1/FVC % % 75 77 67 77 75 76 77  Post FEV1/FCV % % 72 80 - - - 81 80  FEV1-Pre L 2.75 2.81 2.53 2.89 2.75 2.67 2.81  FEV1-Predicted Pre % 99 97 87 99 94 91 94  FEV1-Post L 2.64 2.93 - - - 2.68 2.94  DLCO UNC% % 70 62 61 57 56 53 71  DLCO COR %Predicted % 85 80 76 73 74 76 98  TLC L - 5.98 - - - 5.99 6.15  TLC % Predicted % - 85 - - - 85 86  RV % Predicted % - 92 - - - 97 97    OV 02/17/2019 - telephone visit - risks, benefits, limitations explained  Subjective:  [name], male , DOB: 12/24/1945 , age 84 y.o. , MRN: [id number] , ADDRESS: [address] [place] [address]   02/17/2019 -   Chief Complaint  Patient presents with  . televisit    pt c/o runny nose, nonprod cough X2 days.  coughing until vomiting. denies fever, mucus production.  requesting hycodan.      HPI [name] 81 y.o. -wife took call. Says due to weather change last weekend. Cough since last night, keeping him awake, rates it as mild. Sputum +. Yellow + . Small amoutn. No loss of taste. No loss of smell. No fever. No hemoptysis. No myalgia. Chest hurts  from coughing. No wheezing. No sick contact. Social distancing at home. Wife says this time he gets sick. Usually hycodan called in the past. Wife works as a Marine scientist but since last year not worked. No clusters. All wearing masks at home.  Not been to restaiurants.. Wife sick 3 weeks ago but not covid tested due to low prop. Not fully compliant with symbicort  COVID tested 02/17/2019 - negative   OV 03/18/2019  Subjective:  [name], male , DOB: January 29, 1946 , age 21 y.o. , MRN: [id number] , ADDRESS: [address] [place] [address]   03/18/2019 -   Chief Complaint  Patient presents with  . Follow-up    Pt last seen in 01/2019 for an acute visit for COPD exacerbation. Pt states he feels back to baseline. Pt states he has an occassional cough with clear mucus,  worse in the morning. Pt denies CP/tightness, f/c/s, worsening SOB.      HPI Cru Kritikos 73 y.o. -makes another telephone visit for his COPD.  He has mild COPD.  1 month ago he had a flareup.  We treated him with doxycycline and prednisone.  His Covid test was negative.  He tells me he is back to baseline.  He tells me that he is compliant with his Symbicort.  At this point his wife interjected saying he is noncompliant with his Symbicort because it is 2 puff 2 times a day-throat irritation.  He is willing to try an alternative.  He is not smoking anymore.  Review of his vaccination status shows he is up-to-date with his flu and pneumonia vaccines but he can benefit from shingles vaccine.  He takes his CPAP for sleep apnea.  There are no other issues.  For this telephone visit the risks, benefits and limitations of the telephone visit were discussed.     ROS - per HPI     has a past medical history of AICD (automatic cardioverter/defibrillator) present, Atrial fibrillation ([place]), CHF (congestive heart failure) ([place]), Chronic systolic dysfunction of left ventricle, CKD (chronic kidney disease), stage  III (02/22/2011), COPD (chronic obstructive pulmonary disease) ([place]), Depression, DJD (degenerative joint disease), GERD (gastroesophageal reflux disease), HLD (hyperlipidemia), Hypertension, Ischemic cardiomyopathy, Memory loss, Myocardial infarction ([place]) (1986), OSA on CPAP, Pneumonia (2008), Pneumothorax on left, Seizures ([place]), Status post dilation of esophageal narrowing, Stroke ([place]) (2009), Type 2 diabetes mellitus ([place]), and Ventricular tachycardia ([place]).   reports that he quit smoking about 34 years ago. His smoking use included cigarettes. He started smoking about 57 years ago. He has a 46.00 pack-year smoking history. He has never used smokeless tobacco.  Past Surgical History:  Procedure Laterality Date  . ABDOMINAL HERNIA REPAIR    . CORONARY ANGIOPLASTY  01/03/1985   with pci  to circumflex  . HAND TENDON SURGERY Left ~ 2014   "related to CVA'  . HERNIA REPAIR    . ICD Implantation    . LAPAROSCOPIC APPENDECTOMY N/A 12/24/2017   Procedure: APPENDECTOMY LAPAROSCOPIC;  Surgeon: Fanny Skates, MD;  Location: [facility];  Service: General;  Laterality: N/A;  . RIGHT/LEFT HEART CATH AND CORONARY ANGIOGRAPHY N/A 11/16/2016   Procedure: Right/Left Heart Cath and Coronary Angiography;  Surgeon: Jolaine Artist, MD;  Location: [facility];  Service: Cardiovascular;  Laterality: N/A;  . V TACH ABLATION N/A 12/28/2016   Procedure: Stephanie Coup Ablation;  Surgeon: [name], MD;  Location: [facility];  Service: Cardiovascular;  Laterality: N/A;  Stephanie Coup ABLATION N/A 06/04/2018   Procedure: Stephanie Coup ABLATION;  Surgeon: [name], MD;  Location: [facility];  Service: Cardiovascular;  Laterality: N/A;  . vocal cord surgery  2011   vocal cord stimulator     Allergies  Allergen Reactions  . Aricept [Donepezil Hcl] Other (See Comments)    Worsens renal function  . Codeine Hives  . Nexium [Esomeprazole] Other (See Comments)    Severely worsens renal function  .  Omeprazole Hives  . Pantoprazole Sodium Other (See Comments)    Renal failure  . Tramadol Nausea And Vomiting    Immunization History  Administered Date(s) Administered  . Fluad Quad(high Dose 65+) 01/15/2019  . Influenza Split 02/22/2011  . Influenza Whole 04/12/2007, 02/24/2008, 02/18/2009, 02/18/2010  . Influenza, High Dose Seasonal PF 02/09/2016, 03/13/2017, 02/08/2018  . Influenza,inj,Quad PF,6+ Mos 03/09/2017  . Influenza-Unspecified 02/06/2012  .  Pneumococcal Conjugate-13 01/27/2016  . Pneumococcal Polysaccharide-23 02/24/2008, 02/08/2018    Family History  Problem Relation Age of Onset  . Heart disease Father   . Hyperlipidemia Father   . Hypertension Father   . Heart attack Father   . Kidney disease Father   . Diabetes Father   . Diabetes Sister   . Heart disease Mother   . Diabetes Mother   . Emphysema Mother   . Parkinsonism Mother   . COPD Brother   . Diabetes Brother   . COPD Brother   . Heart disease Brother   . Heart disease Brother   . Sudden death Neg Hx      Current Outpatient Medications:  .  albuterol (PROVENTIL HFA;VENTOLIN HFA) 108 (90 Base) MCG/ACT inhaler, Inhale 1-2 puffs into the lungs every 4 (four) hours as needed for wheezing or shortness of breath (or cough)., Disp: 1 Inhaler, Rfl: 5 .  apixaban (ELIQUIS) 5 MG TABS tablet, Take 1 tablet (5 mg total) by mouth 2 (two) times daily., Disp: 180 tablet, Rfl: 3 .  Blood Glucose Monitoring Suppl (ONETOUCH VERIO FLEX SYSTEM) w/Device KIT, USE TO TEST BLOOD GLUCOSE THREE TIMES DAILY, Disp: 1 kit, Rfl: 0 .  budesonide-formoterol (SYMBICORT) 160-4.5 MCG/ACT inhaler, Inhale 2 puffs into the lungs every 12 (twelve) hours., Disp: 1 Inhaler, Rfl: 6 .  EASY COMFORT PEN NEEDLES 31G X 5 MM MISC, USE DAILY AT BEDTIME., Disp: 100 each, Rfl: 3 .  fenofibrate 160 MG tablet, TAKE 1 TABLET (160 MG TOTAL) BY MOUTH EVERY EVENING., Disp: 90 tablet, Rfl: 3 .  finasteride (PROSCAR) 5 MG tablet, TAKE ONE TABLET BY MOUTH  ONCE DAILY, Disp: 30 tablet, Rfl: 2 .  furosemide (LASIX) 20 MG tablet, Take 1 tablet (20 mg) 3 times a week. Monday, Wednesday, and Friday, Disp: 45 tablet, Rfl: 3 .  isosorbide-hydrALAZINE (BIDIL) 20-37.5 MG tablet, Take 2 tablets by mouth 2 (two) times daily., Disp: 360 tablet, Rfl: 2 .  Lacosamide 100 MG TABS, Take 1 tablet (100 mg total) by mouth 2 (two) times a day., Disp: 180 tablet, Rfl: 3 .  LANTUS SOLOSTAR 100 UNIT/ML Solostar Pen, INJECT 10 UNITS INTO THE SKIN AT BEDTIME.  LAST FOR 150 DAYS , Disp: 15 mL, Rfl: 0 .  levocetirizine (XYZAL) 5 MG tablet, TAKE 1 TABLET (5 MG TOTAL) BY MOUTH EVERY EVENING., Disp: 90 tablet, Rfl: 3 .  memantine (NAMENDA) 10 MG tablet, Take 1 tablet every night for 2 weeks, then increase to 1 tab twice a day (Patient taking differently: 2 (two) times daily. ), Disp: 60 tablet, Rfl: 5 .  metoprolol succinate (TOPROL-XL) 25 MG 24 hr tablet, TAKE 1 TABLET (25 MG TOTAL) BY MOUTH EVERY MORNING., Disp: 90 tablet, Rfl: 3 .  metoprolol succinate (TOPROL-XL) 50 MG 24 hr tablet, TAKE 1 TABLET (50 MG TOTAL) BY MOUTH EVERY EVENING. TAKE WITH OR IMMEDIATELY FOLLOWING A MEAL., Disp: 90 tablet, Rfl: 2 .  mirabegron ER (MYRBETRIQ) 25 MG TB24 tablet, Take 25 mg by mouth daily. , Disp: , Rfl:  .  nitroGLYCERIN (NITROSTAT) 0.4 MG SL tablet, PLACE 1 TABLET (0.4 MG TOTAL) UNDER THE TONGUE EVERY 5 (FIVE) MINUTES FOR 3 DOSES AS NEEDED FOR CHEST PAIN., Disp: 25 tablet, Rfl: 3 .  ONETOUCH VERIO test strip, Use to test blood glucose TID.  Medically Necessary.  Use Medicare Part B., Disp: 300 each, Rfl: 3 .  Polyethyl Glycol-Propyl Glycol (SYSTANE) 0.4-0.3 % SOLN, Place 1 drop into both eyes daily as needed (for  dry eyes)., Disp: , Rfl:  .  ranolazine (RANEXA) 500 MG 12 hr tablet, Take 1 tablet (500 mg total) by mouth daily., Disp: 90 tablet, Rfl: 2 .  rosuvastatin (CRESTOR) 40 MG tablet, Take 1 tablet (40 mg total) by mouth every evening., Disp: 90 tablet, Rfl: 3 .  sacubitril-valsartan  (ENTRESTO) 97-103 MG, Take 1 tablet by mouth 2 (two) times daily., Disp: 180 tablet, Rfl: 3 .  sotalol (BETAPACE) 80 MG tablet, TAKE 1 TABLET (80 MG TOTAL) BY MOUTH DAILY., Disp: 90 tablet, Rfl: 1 .  tamsulosin (FLOMAX) 0.4 MG CAPS capsule, Take 0.4 mg by mouth at bedtime. , Disp: , Rfl:       Objective:   There were no vitals filed for this visit.  Estimated body mass index is 28.24 kg/m as calculated from the following:   Height as of 03/17/19: '5\' 9"'  (1.753 m).   Weight as of 03/17/19: 191 lb 3.2 oz (86.7 kg).  '@WEIGHTCHANGE' @  There were no vitals filed for this visit.   Physical Exam  Telephone visit only       Assessment:       ICD-10-CM   1. Pulmonary emphysema, unspecified emphysema type ([place])  J43.9   2. COPD, mild ([place])  J44.9        Plan:     Patient Instructions     ICD-10-CM   1. Pulmonary emphysema, unspecified emphysema type ([place])  J43.9   2. COPD, mild ([place])  J44.9    Glad better after recent flare up Too bad inhaler non-compliance is an issue Glad uptodate with respiratory vaccines  PLAN Stop symbicort due to schedule challenges and throat irritation Start spiriva respimat daily Continue albuterol as needed Continue CPAP for sleep apnea as before Please talk to PCP Dorothyann Peng, NP -  and ensure you get  shingrix (GSK) inactivated vaccine against shingles  Followup[ 3 months for face to face visit or sooner if needed    (> 50% of this 15 min visit spent in face to face counseling or/and coordination of care by this undersigned MD - Dr Brand Males. This includes one or more of the following documented above: discussion of test results, diagnostic or treatment recommendations, prognosis, risks and benefits of management options, instructions, education, compliance or risk-factor reduction)   SIGNATURE    Dr. Brand Males, M.D., F.C.C.P,  Pulmonary and Critical Care Medicine Staff Physician, [place] Director -  Interstitial Lung Disease  Program  Pulmonary [place] at [place], [place], [address]  Pager: [phone], If no answer or between  15:00h - 7:00h: call [phone] Telephone: [phone]  9:19 AM 03/18/2019

## 2019-03-18 NOTE — Addendum Note (Signed)
Addended by: Collier Salina on: 03/18/2019 04:00 PM   Modules accepted: Orders

## 2019-03-18 NOTE — Patient Instructions (Addendum)
ICD-10-CM   1. Pulmonary emphysema, unspecified emphysema type ([place])  J43.9   2. COPD, mild ([place])  J44.9    Glad better after recent flare up Too bad inhaler non-compliance is an issue Glad uptodate with respiratory vaccines  PLAN Stop symbicort due to schedule challenges and throat irritation Start spiriva respimat daily Continue albuterol as needed Continue CPAP for sleep apnea as before Please talk to PCP Dorothyann Peng, NP -  and ensure you get  shingrix ([place]) inactivated vaccine against shingles  Followup[ 3 months for face to face visit or sooner if needed

## 2019-03-20 ENCOUNTER — Other Ambulatory Visit (HOSPITAL_COMMUNITY): Payer: Self-pay | Admitting: Internal Medicine

## 2019-03-25 ENCOUNTER — Telehealth: Payer: Self-pay | Admitting: Family Medicine

## 2019-03-25 DIAGNOSIS — R1310 Dysphagia, unspecified: Secondary | ICD-10-CM | POA: Diagnosis not present

## 2019-03-25 DIAGNOSIS — R633 Feeding difficulties: Secondary | ICD-10-CM | POA: Diagnosis not present

## 2019-03-25 DIAGNOSIS — Z87891 Personal history of nicotine dependence: Secondary | ICD-10-CM | POA: Diagnosis not present

## 2019-03-25 DIAGNOSIS — K219 Gastro-esophageal reflux disease without esophagitis: Secondary | ICD-10-CM | POA: Diagnosis not present

## 2019-03-25 DIAGNOSIS — G4730 Sleep apnea, unspecified: Secondary | ICD-10-CM | POA: Diagnosis not present

## 2019-03-25 NOTE — Telephone Encounter (Signed)
He saw Dr. Elease Hashimoto for this issue. Will need follow up appointment

## 2019-03-25 NOTE — Telephone Encounter (Signed)
Pt scheduled for virtual visit to discuss with [facility] on 03/26/2019

## 2019-03-25 NOTE — Telephone Encounter (Signed)
Copied from [place] [id number]. Topic: General - Other >> Mar 24, 2019  9:26 AM Rainey Pines A wrote: Patient was told to call and let doctor know how he was feeling . Patient isnt getting any better and stated that the tylenol is not working for his pain. Please advise.

## 2019-03-26 ENCOUNTER — Telehealth (INDEPENDENT_AMBULATORY_CARE_PROVIDER_SITE_OTHER): Payer: Medicare HMO | Admitting: Family Medicine

## 2019-03-26 ENCOUNTER — Encounter: Payer: Medicare HMO | Admitting: Adult Health

## 2019-03-26 ENCOUNTER — Encounter: Payer: Self-pay | Admitting: Family Medicine

## 2019-03-26 ENCOUNTER — Other Ambulatory Visit: Payer: Self-pay

## 2019-03-26 DIAGNOSIS — M25552 Pain in left hip: Secondary | ICD-10-CM

## 2019-03-26 MED ORDER — HYDROCODONE-ACETAMINOPHEN 5-325 MG PO TABS: 1.0000 | ORAL_TABLET | ORAL | 0 refills | Status: AC | PRN

## 2019-03-26 NOTE — Progress Notes (Signed)
Virtual Visit via Telephone Note  I connected with the patient on 03/26/19 at  3:00 PM EST by telephone and verified that I am speaking with the correct person using two identifiers. We attempted to connect virtually but we had technical difficulties with the audio and video.     I discussed the limitations, risks, security and privacy concerns of performing an evaluation and management service by telephone and the availability of in person appointments. I also discussed with the patient that there may be a patient responsible charge related to this service. The patient expressed understanding and agreed to proceed.  Location patient: home Location provider: work or home office Participants present for the call: patient, provider Patient did not have a visit in the prior 7 days to address this/these issue(s).   History of Present Illness: Here to discuss pain in the left hip after a fall that occurred 4 weeks ago. A dog jumped on him and he lost his balance, causing him to fall forward. He was seen her in the office and had a normal Xray. Since then the pain has continued and Tylenol does not help at all.    Observations/Objective: Patient sounds cheerful and well on the phone. I do not appreciate any SOB. Speech and thought processing are grossly intact. Patient reported vitals:  Assessment and Plan: Left hip pain, I am concerned about a possible labral tear. We will give him some Norco to use for pain. Refer to Orthopedics to evaluate further.  Alysia Penna, MD   Follow Up Instructions:     [phone] [phone] 11-20 [id number] 21-30 I did not refer this patient for an OV in the next 24 hours for this/these issue(s).  I discussed the assessment and treatment plan with the patient. The patient was provided an opportunity to ask questions and all were answered. The patient agreed with the plan and demonstrated an understanding of the instructions.   The patient was advised to call back or  seek an in-person evaluation if the symptoms worsen or if the condition fails to improve as anticipated.  I provided 19 minutes of non-face-to-face time during this encounter.   Alysia Penna, MD

## 2019-04-01 NOTE — Progress Notes (Signed)
erroneous error  

## 2019-04-08 ENCOUNTER — Other Ambulatory Visit: Payer: Self-pay

## 2019-04-08 ENCOUNTER — Ambulatory Visit (INDEPENDENT_AMBULATORY_CARE_PROVIDER_SITE_OTHER): Payer: Medicare HMO | Admitting: Orthopaedic Surgery

## 2019-04-08 ENCOUNTER — Encounter: Payer: Self-pay | Admitting: Orthopaedic Surgery

## 2019-04-08 DIAGNOSIS — M1612 Unilateral primary osteoarthritis, left hip: Secondary | ICD-10-CM
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
## 2019-04-09 ENCOUNTER — Ambulatory Visit (INDEPENDENT_AMBULATORY_CARE_PROVIDER_SITE_OTHER): Payer: Medicare HMO | Admitting: *Deleted

## 2019-04-09 ENCOUNTER — Ambulatory Visit: Payer: Self-pay

## 2019-04-09 ENCOUNTER — Ambulatory Visit (INDEPENDENT_AMBULATORY_CARE_PROVIDER_SITE_OTHER): Payer: Medicare HMO | Admitting: Family Medicine

## 2019-04-09 ENCOUNTER — Encounter: Payer: Self-pay | Admitting: Family Medicine

## 2019-04-09 DIAGNOSIS — I255 Ischemic cardiomyopathy: Secondary | ICD-10-CM | POA: Diagnosis not present

## 2019-04-09 DIAGNOSIS — M1612 Unilateral primary osteoarthritis, left hip: Secondary | ICD-10-CM

## 2019-04-09 DIAGNOSIS — I472 Ventricular tachycardia, unspecified: Secondary | ICD-10-CM

## 2019-04-09 LAB — CUP PACEART REMOTE DEVICE CHECK
Battery Remaining Longevity: 18 mo
Battery Remaining Percentage: 24 %
Brady Statistic RA Percent Paced: 26 %
Brady Statistic RV Percent Paced: 0 %
Date Time Interrogation Session: 20201118092200
HighPow Impedance: 55 Ohm
Implantable Lead Implant Date: 20050922
Implantable Lead Implant Date: 20050922
Implantable Lead Location: 753859
Implantable Lead Location: 753860
Implantable Lead Model: 158
Implantable Lead Model: 5076
Implantable Lead Serial Number: 156891
Implantable Pulse Generator Implant Date: 20100817
Lead Channel Impedance Value: 451 Ohm
Lead Channel Impedance Value: 496 Ohm
Lead Channel Pacing Threshold Amplitude: 0.6 V
Lead Channel Pacing Threshold Amplitude: 1 V
Lead Channel Pacing Threshold Pulse Width: 0.4 ms
Lead Channel Pacing Threshold Pulse Width: 0.4 ms
Lead Channel Setting Pacing Amplitude: 2 V
Lead Channel Setting Pacing Amplitude: 2.4 V
Lead Channel Setting Pacing Pulse Width: 0.4 ms
Lead Channel Setting Sensing Sensitivity: 0.6 mV
Pulse Gen Serial Number: 141895

## 2019-04-09 NOTE — Progress Notes (Signed)
Subjective: Patient is here for ultrasound-guided intra-articular left hip injection.   Mostly lateral pain when first gets up, and aches at night.  Objective:  Tender at GT, but very painful with passive IR.  Procedure: Ultrasound-guided left hip injection: After sterile prep with Betadine, injected 8 cc 1% lidocaine without epinephrine and 40 mg methylprednisolone using a 22-gauge spinal needle, passing the needle through the iliofemoral ligament into the femoral head/neck junction.  Injectate seen filling joint capsule.  Excellent immediate relief.

## 2019-04-12 ENCOUNTER — Other Ambulatory Visit (HOSPITAL_COMMUNITY): Payer: Self-pay | Admitting: Internal Medicine

## 2019-04-14 ENCOUNTER — Telehealth: Payer: Self-pay | Admitting: Adult Health

## 2019-04-14 NOTE — Telephone Encounter (Signed)
Pt's spouse called in to make provider aware that pt's blood sugar has been fluctuating this last week. They were told to make PCP aware. Reading has been with in 300 and 400, yesterday it was 90. Pt is having any sx or concerns and feels fine.  Spouse would like to be advised from provider on what they should do?    CBKruse Shoupe -wife -[phone]

## 2019-04-15 ENCOUNTER — Other Ambulatory Visit (INDEPENDENT_AMBULATORY_CARE_PROVIDER_SITE_OTHER): Payer: Medicare HMO

## 2019-04-15 ENCOUNTER — Other Ambulatory Visit: Payer: Self-pay

## 2019-04-15 DIAGNOSIS — E1165 Type 2 diabetes mellitus with hyperglycemia: Secondary | ICD-10-CM | POA: Diagnosis not present

## 2019-04-15 LAB — BASIC METABOLIC PANEL
BUN: 19 mg/dL (ref 6–23)
CO2: 30 mEq/L (ref 19–32)
Calcium: 9.8 mg/dL (ref 8.4–10.5)
Chloride: 103 mEq/L (ref 96–112)
Creatinine, Ser: 1.47 mg/dL (ref 0.40–1.50)
GFR: 56.87 mL/min — ABNORMAL LOW (ref >60.00)
Glucose, Bld: 100 mg/dL — ABNORMAL HIGH (ref 70–99)
Potassium: 4.1 mEq/L (ref 3.5–5.1)
Sodium: 138 mEq/L (ref 135–145)

## 2019-04-15 LAB — HEMOGLOBIN A1C: Hgb A1c MFr Bld: 7.5 % — ABNORMAL HIGH (ref 4.6–6.5)

## 2019-04-15 NOTE — Telephone Encounter (Signed)
Left a message for a return call.

## 2019-04-15 NOTE — Telephone Encounter (Signed)
Spoke to [name] (wife) and informed her the pt is past due for lab work.  Scheduled for lab appt today and virtual follow up on 04/16/2019.

## 2019-04-16 ENCOUNTER — Telehealth (INDEPENDENT_AMBULATORY_CARE_PROVIDER_SITE_OTHER): Payer: Medicare HMO | Admitting: Adult Health

## 2019-04-16 DIAGNOSIS — E1165 Type 2 diabetes mellitus with hyperglycemia: Secondary | ICD-10-CM | POA: Diagnosis not present
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
## 2019-04-24 ENCOUNTER — Telehealth: Payer: Self-pay | Admitting: Adult Health

## 2019-04-24 MED ORDER — LANTUS SOLOSTAR 100 UNIT/ML ~~LOC~~ SOPN: PEN_INJECTOR | SUBCUTANEOUS | 0 refills | Status: DC

## 2019-04-24 NOTE — Telephone Encounter (Signed)
Spouse requesting insulin glargine (LANTUS) 100 unit/mL SOPN and states he will run out before the month is out, please send a new rx. Spouse states he had to use more then normal. Please [place] [place]

## 2019-04-24 NOTE — Telephone Encounter (Signed)
SENT TO THE PHARMACY BY E-SCRIBE. 

## 2019-04-24 NOTE — Telephone Encounter (Signed)
See request °

## 2019-04-28 DIAGNOSIS — H2513 Age-related nuclear cataract, bilateral: Secondary | ICD-10-CM | POA: Diagnosis not present

## 2019-05-08 ENCOUNTER — Other Ambulatory Visit: Payer: Self-pay | Admitting: Neurology

## 2019-05-08 ENCOUNTER — Other Ambulatory Visit: Payer: Self-pay

## 2019-05-08 ENCOUNTER — Other Ambulatory Visit (HOSPITAL_COMMUNITY): Payer: Self-pay | Admitting: Internal Medicine

## 2019-05-08 NOTE — Progress Notes (Signed)
Remote ICD transmission.   

## 2019-05-25 IMAGING — DX DG HIP (WITH OR WITHOUT PELVIS) 2-3V*R*
3 series · 3 of 3 positions shown · non-contrast
Comparison: 05/03/2016 .

CLINICAL DATA: Fall.

EXAM:
DG HIP (WITH OR WITHOUT PELVIS) 2-3V RIGHT

[pelvis ap]
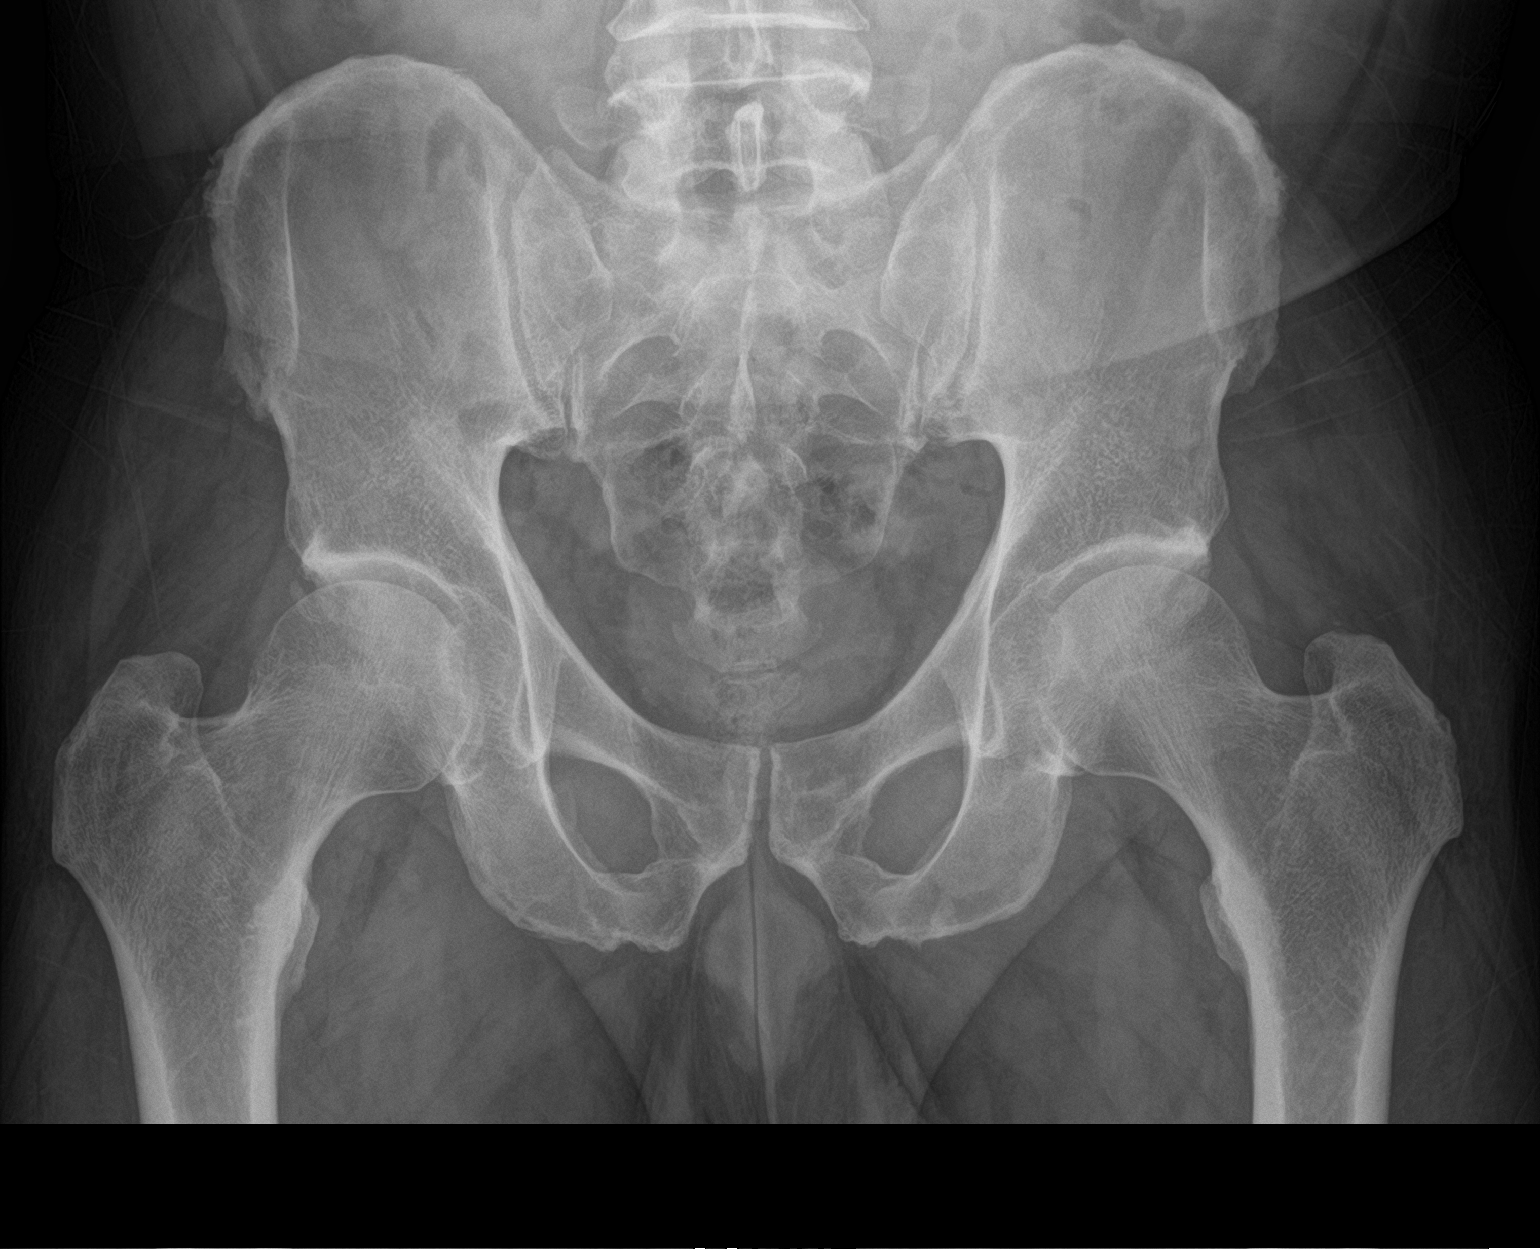

[hip ap]
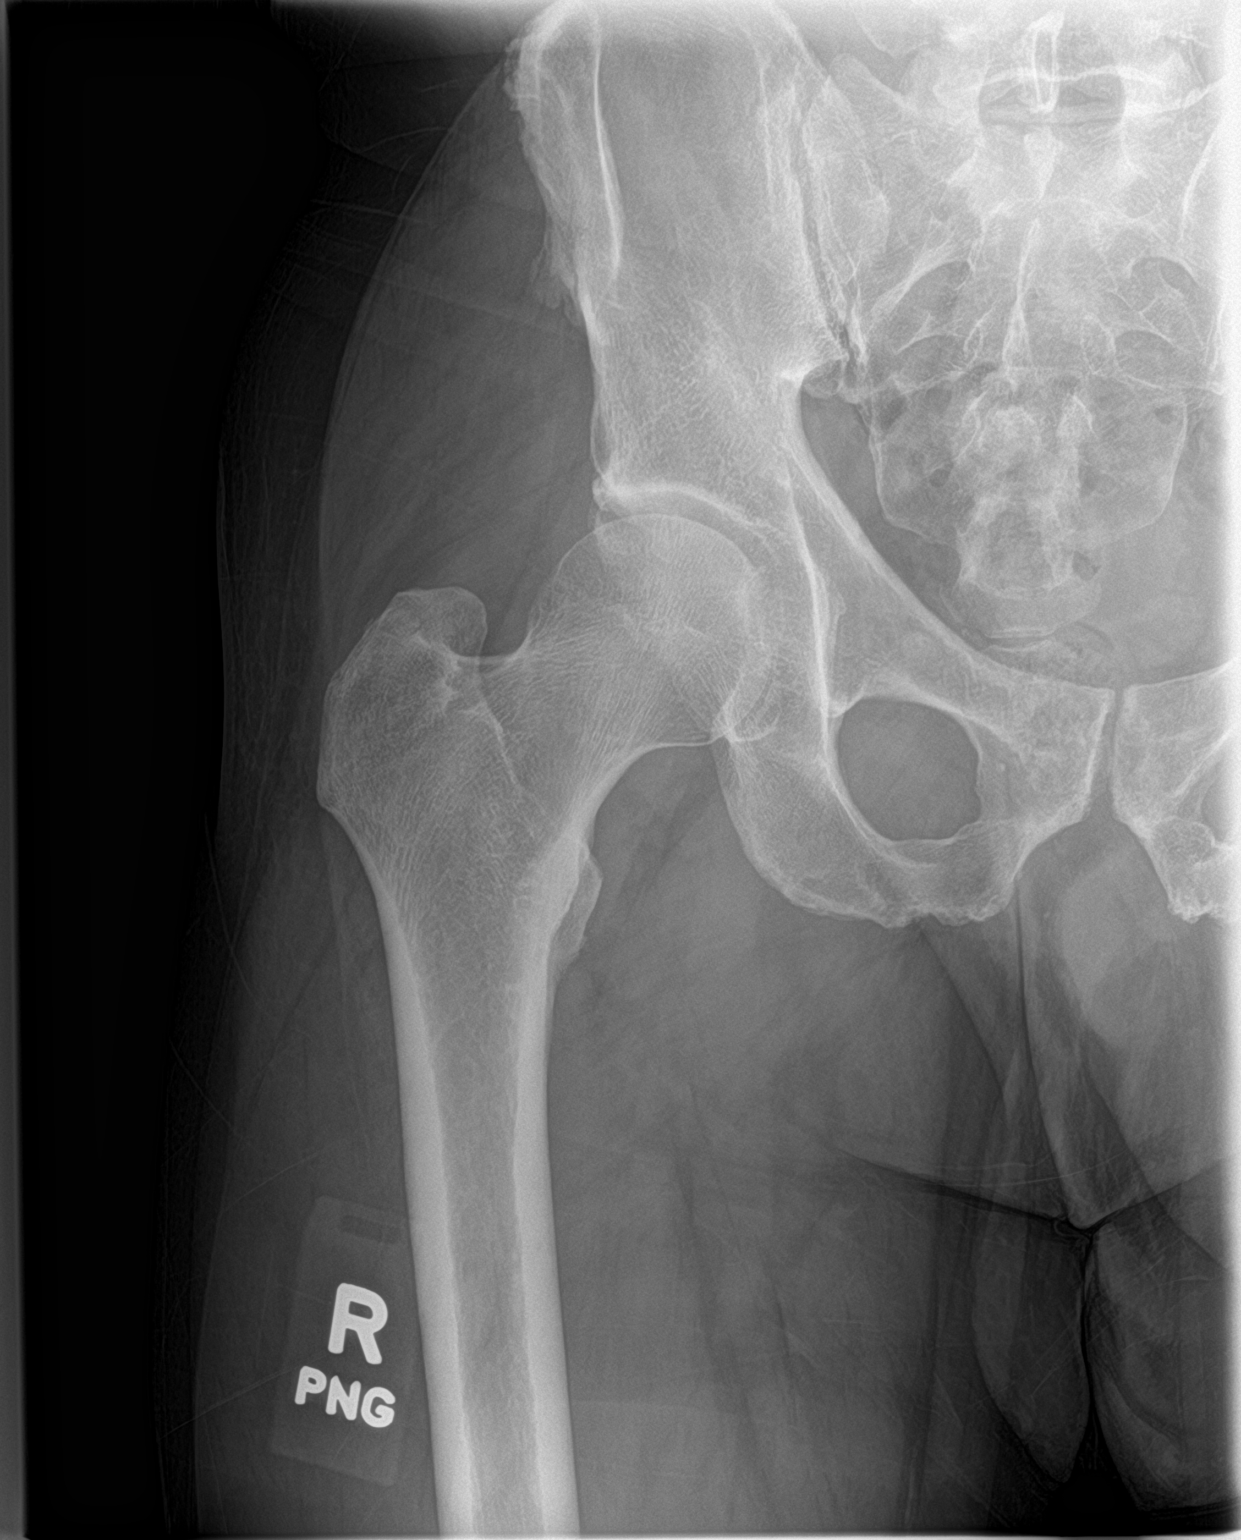

[hip lat]
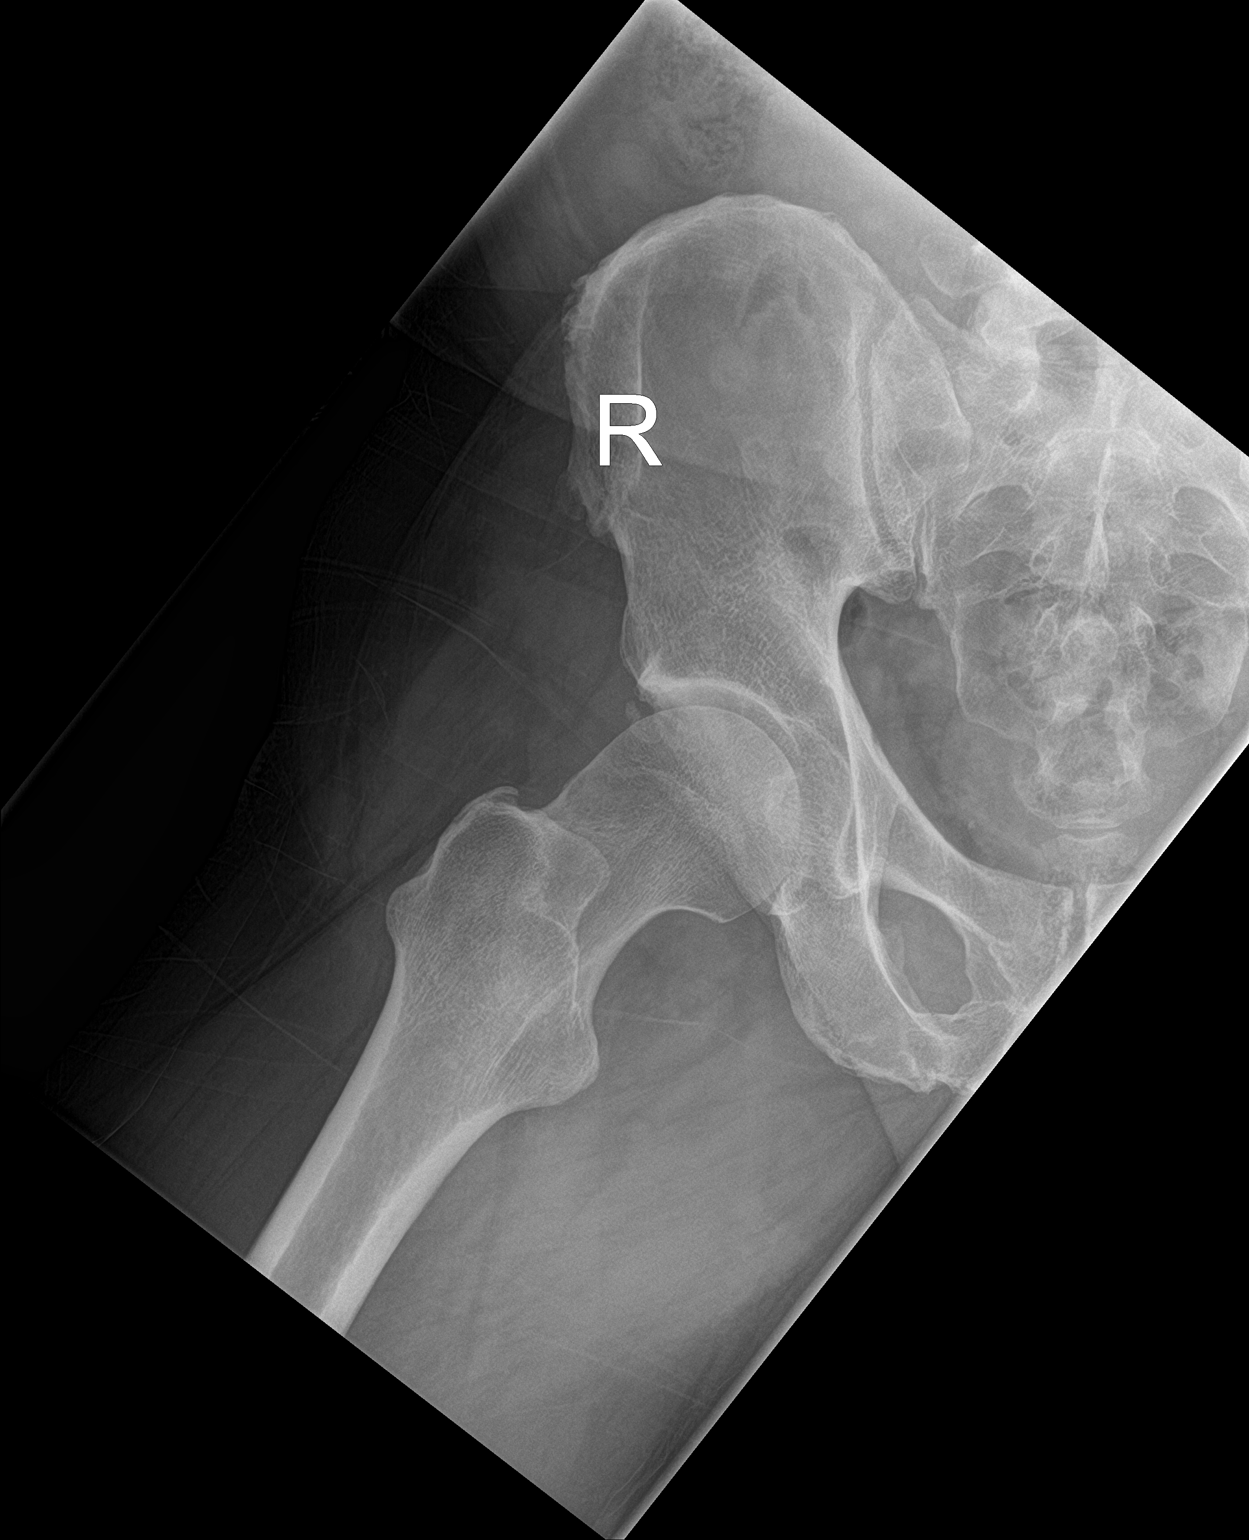

[3 of 3 positions shown; findings below may reference images not displayed]

FINDINGS: Mild degenerative changes lumbar spine and both hips. Tiny
corticated bony density noted adjacent to the right acetabulum is
stable in appearance and most likely related to degenerative change
. No acute bony abnormality identified. No evidence of acute
fracture or dislocation. Punctate calcifications in pelvis
consistent with phleboliths. Exam is stable from prior exam.
IMPRESSION: Mild degenerative changes lumbar spine and both hips. No acute
abnormality identified. Exam is stable from prior exam .

## 2019-05-29 ENCOUNTER — Encounter (INDEPENDENT_AMBULATORY_CARE_PROVIDER_SITE_OTHER): Payer: Medicare HMO | Admitting: Adult Health

## 2019-05-29 ENCOUNTER — Encounter: Payer: Self-pay | Admitting: Adult Health

## 2019-05-29 ENCOUNTER — Other Ambulatory Visit: Payer: Self-pay

## 2019-06-18 ENCOUNTER — Other Ambulatory Visit: Payer: Self-pay | Admitting: Neurology

## 2019-06-18 ENCOUNTER — Other Ambulatory Visit: Payer: Self-pay | Admitting: Physician Assistant

## 2019-06-19 ENCOUNTER — Other Ambulatory Visit: Payer: Self-pay | Admitting: Neurology

## 2019-06-19 NOTE — Telephone Encounter (Signed)
Pt wife called informed that pharmacy dose have his script for his vimpat. Pharmacy was called and it was confirmed

## 2019-06-19 NOTE — Telephone Encounter (Signed)
Patient's wife called and said the pharmacy needs a new prescription for Vimpat 100 MG for the new year.   [facility]

## 2019-06-26 ENCOUNTER — Other Ambulatory Visit (HOSPITAL_COMMUNITY): Payer: Self-pay | Admitting: Internal Medicine

## 2019-06-26 ENCOUNTER — Telehealth (HOSPITAL_COMMUNITY): Payer: Self-pay

## 2019-06-26 NOTE — Telephone Encounter (Signed)
Please contact patient, he needs a appointment with Dr. Haroldine Laws

## 2019-07-04 ENCOUNTER — Other Ambulatory Visit (HOSPITAL_COMMUNITY): Payer: Self-pay | Admitting: Internal Medicine

## 2019-07-04 DIAGNOSIS — I482 Chronic atrial fibrillation, unspecified: Secondary | ICD-10-CM

## 2019-07-09 ENCOUNTER — Encounter (HOSPITAL_COMMUNITY): Payer: Self-pay | Admitting: Internal Medicine

## 2019-07-09 ENCOUNTER — Other Ambulatory Visit: Payer: Self-pay

## 2019-07-09 ENCOUNTER — Ambulatory Visit (INDEPENDENT_AMBULATORY_CARE_PROVIDER_SITE_OTHER): Payer: Medicare HMO | Admitting: *Deleted

## 2019-07-09 ENCOUNTER — Ambulatory Visit (HOSPITAL_COMMUNITY)
Admission: RE | Admit: 2019-07-09 | Discharge: 2019-07-09 | Disposition: A | Discharge status: Home / Self Care | Payer: Medicare HMO | Source: Ambulatory Visit | Attending: Internal Medicine | Admitting: Internal Medicine

## 2019-07-09 VITALS — BP 116/68 | HR 56 | Wt 198.2 lb

## 2019-07-09 DIAGNOSIS — Z794 Long term (current) use of insulin: Secondary | ICD-10-CM | POA: Diagnosis not present

## 2019-07-09 DIAGNOSIS — I130 Hypertensive heart and chronic kidney disease with heart failure and stage 1 through stage 4 chronic kidney disease, or unspecified chronic kidney disease: Secondary | ICD-10-CM | POA: Diagnosis not present

## 2019-07-09 DIAGNOSIS — I480 Paroxysmal atrial fibrillation: Secondary | ICD-10-CM | POA: Insufficient documentation

## 2019-07-09 DIAGNOSIS — Z8673 Personal history of transient ischemic attack (TIA), and cerebral infarction without residual deficits: Secondary | ICD-10-CM | POA: Insufficient documentation

## 2019-07-09 DIAGNOSIS — I255 Ischemic cardiomyopathy: Secondary | ICD-10-CM | POA: Insufficient documentation

## 2019-07-09 DIAGNOSIS — Z820 Family history of epilepsy and other diseases of the nervous system: Secondary | ICD-10-CM | POA: Diagnosis not present

## 2019-07-09 DIAGNOSIS — Z885 Allergy status to narcotic agent status: Secondary | ICD-10-CM | POA: Diagnosis not present

## 2019-07-09 DIAGNOSIS — I5022 Chronic systolic (congestive) heart failure: Secondary | ICD-10-CM | POA: Insufficient documentation

## 2019-07-09 DIAGNOSIS — Z841 Family history of disorders of kidney and ureter: Secondary | ICD-10-CM | POA: Diagnosis not present

## 2019-07-09 DIAGNOSIS — Z888 Allergy status to other drugs, medicaments and biological substances status: Secondary | ICD-10-CM | POA: Diagnosis not present

## 2019-07-09 DIAGNOSIS — Z7901 Long term (current) use of anticoagulants: Secondary | ICD-10-CM | POA: Diagnosis not present

## 2019-07-09 DIAGNOSIS — Z7951 Long term (current) use of inhaled steroids: Secondary | ICD-10-CM | POA: Insufficient documentation

## 2019-07-09 DIAGNOSIS — Z9861 Coronary angioplasty status: Secondary | ICD-10-CM | POA: Diagnosis not present

## 2019-07-09 DIAGNOSIS — N183 Chronic kidney disease, stage 3 unspecified: Secondary | ICD-10-CM | POA: Diagnosis not present

## 2019-07-09 DIAGNOSIS — I2510 Atherosclerotic heart disease of native coronary artery without angina pectoris: Secondary | ICD-10-CM | POA: Diagnosis not present

## 2019-07-09 DIAGNOSIS — I472 Ventricular tachycardia, unspecified: Secondary | ICD-10-CM

## 2019-07-09 DIAGNOSIS — G4733 Obstructive sleep apnea (adult) (pediatric): Secondary | ICD-10-CM | POA: Insufficient documentation

## 2019-07-09 DIAGNOSIS — K219 Gastro-esophageal reflux disease without esophagitis: Secondary | ICD-10-CM | POA: Insufficient documentation

## 2019-07-09 DIAGNOSIS — Z833 Family history of diabetes mellitus: Secondary | ICD-10-CM | POA: Diagnosis not present

## 2019-07-09 DIAGNOSIS — Z825 Family history of asthma and other chronic lower respiratory diseases: Secondary | ICD-10-CM | POA: Diagnosis not present

## 2019-07-09 DIAGNOSIS — E785 Hyperlipidemia, unspecified: Secondary | ICD-10-CM | POA: Insufficient documentation

## 2019-07-09 DIAGNOSIS — E1122 Type 2 diabetes mellitus with diabetic chronic kidney disease: Secondary | ICD-10-CM | POA: Diagnosis not present

## 2019-07-09 DIAGNOSIS — Z79899 Other long term (current) drug therapy: Secondary | ICD-10-CM | POA: Diagnosis not present

## 2019-07-09 DIAGNOSIS — J449 Chronic obstructive pulmonary disease, unspecified: Secondary | ICD-10-CM | POA: Insufficient documentation

## 2019-07-09 DIAGNOSIS — Z95810 Presence of automatic (implantable) cardiac defibrillator: Secondary | ICD-10-CM | POA: Insufficient documentation

## 2019-07-09 DIAGNOSIS — Z8249 Family history of ischemic heart disease and other diseases of the circulatory system: Secondary | ICD-10-CM | POA: Diagnosis not present

## 2019-07-09 DIAGNOSIS — Z87891 Personal history of nicotine dependence: Secondary | ICD-10-CM | POA: Diagnosis not present

## 2019-07-09 DIAGNOSIS — I252 Old myocardial infarction: Secondary | ICD-10-CM | POA: Insufficient documentation

## 2019-07-09 DIAGNOSIS — R413 Other amnesia: Secondary | ICD-10-CM | POA: Insufficient documentation

## 2019-07-09 DIAGNOSIS — Z8349 Family history of other endocrine, nutritional and metabolic diseases: Secondary | ICD-10-CM | POA: Insufficient documentation

## 2019-07-09 LAB — CUP PACEART REMOTE DEVICE CHECK
Battery Remaining Longevity: 18 mo
Battery Remaining Longevity: 18 mo
Battery Remaining Percentage: 23 %
Battery Remaining Percentage: 23 %
Brady Statistic RA Percent Paced: 25 %
Brady Statistic RA Percent Paced: 25 %
Brady Statistic RV Percent Paced: 0 %
Brady Statistic RV Percent Paced: 0 %
Date Time Interrogation Session: 20210217042100
Date Time Interrogation Session: 20210217042100
HighPow Impedance: 52 Ohm
HighPow Impedance: 52 Ohm
Implantable Lead Implant Date: 20050922
Implantable Lead Implant Date: 20050922
Implantable Lead Implant Date: 20050922
Implantable Lead Implant Date: 20050922
Implantable Lead Location: 753859
Implantable Lead Location: 753860
Implantable Lead Location: 753859
Implantable Lead Location: 753860
Implantable Lead Model: 158
Implantable Lead Model: 5076
Implantable Lead Model: 158
Implantable Lead Model: 5076
Implantable Lead Serial Number: 156891
Implantable Lead Serial Number: 156891
Implantable Pulse Generator Implant Date: 20100817
Implantable Pulse Generator Implant Date: 20100817
Lead Channel Impedance Value: 459 Ohm
Lead Channel Impedance Value: 493 Ohm
Lead Channel Impedance Value: 459 Ohm
Lead Channel Impedance Value: 493 Ohm
Lead Channel Pacing Threshold Amplitude: 0.6 V
Lead Channel Pacing Threshold Amplitude: 1 V
Lead Channel Pacing Threshold Amplitude: 0.6 V
Lead Channel Pacing Threshold Amplitude: 1 V
Lead Channel Pacing Threshold Pulse Width: 0.4 ms
Lead Channel Pacing Threshold Pulse Width: 0.4 ms
Lead Channel Pacing Threshold Pulse Width: 0.4 ms
Lead Channel Pacing Threshold Pulse Width: 0.4 ms
Lead Channel Setting Pacing Amplitude: 2 V
Lead Channel Setting Pacing Amplitude: 2.4 V
Lead Channel Setting Pacing Amplitude: 2 V
Lead Channel Setting Pacing Amplitude: 2.4 V
Lead Channel Setting Pacing Pulse Width: 0.4 ms
Lead Channel Setting Pacing Pulse Width: 0.4 ms
Lead Channel Setting Sensing Sensitivity: 0.6 mV
Lead Channel Setting Sensing Sensitivity: 0.6 mV
Pulse Gen Serial Number: 141895
Pulse Gen Serial Number: 141895

## 2019-07-09 LAB — COMPREHENSIVE METABOLIC PANEL
ALT: 25 U/L (ref 0–44)
AST: 23 U/L (ref 15–41)
Albumin: 4 g/dL (ref 3.5–5.0)
Alkaline Phosphatase: 43 U/L (ref 38–126)
Anion gap: 9 (ref 5–15)
BUN: 19 mg/dL (ref 8–23)
CO2: 26 mmol/L (ref 22–32)
Calcium: 9.6 mg/dL (ref 8.9–10.3)
Chloride: 105 mmol/L (ref 98–111)
Creatinine, Ser: 1.66 mg/dL — ABNORMAL HIGH (ref 0.61–1.24)
GFR calc Af Amer: 47 mL/min — ABNORMAL LOW (ref >60)
GFR calc non Af Amer: 41 mL/min — ABNORMAL LOW (ref >60)
Glucose, Bld: 147 mg/dL — ABNORMAL HIGH (ref 70–99)
Potassium: 4.2 mmol/L (ref 3.5–5.1)
Sodium: 140 mmol/L (ref 135–145)
Total Bilirubin: 0.7 mg/dL (ref 0.3–1.2)
Total Protein: 7 g/dL (ref 6.5–8.1)

## 2019-07-09 LAB — CBC
HCT: 43 % (ref 39.0–52.0)
Hemoglobin: 14 g/dL (ref 13.0–17.0)
MCH: 30.6 pg (ref 26.0–34.0)
MCHC: 32.6 g/dL (ref 30.0–36.0)
MCV: 94.1 fL (ref 80.0–100.0)
Platelets: 213 10*3/uL (ref 150–400)
RBC: 4.57 MIL/uL (ref 4.22–5.81)
RDW: 13.2 % (ref 11.5–15.5)
WBC: 5.4 10*3/uL (ref 4.0–10.5)
nRBC: 0 % (ref 0.0–0.2)

## 2019-07-09 LAB — BRAIN NATRIURETIC PEPTIDE: B Natriuretic Peptide: 262.1 pg/mL — ABNORMAL HIGH (ref 0.0–100.0)

## 2019-07-09 NOTE — Patient Instructions (Signed)
Lab work done today. We will notify you of any abnormal lab work. No news is good news!  EKG done today.   Your physician has requested that you have an echocardiogram. Echocardiography is a painless test that uses sound waves to create images of your heart. It provides your doctor with information about the size and shape of your heart and how well your heart's chambers and valves are working. This procedure takes approximately one hour. There are no restrictions for this procedure.   Please ask your PCP to consider [place].  Please follow up with the [facility] in 6 months. We currently do not have that schedule. Please give [place] a call in June 2021 in order to schedule your appointment. Please call us at [phone] option #3.  At the [facility], you and your health needs are our priority. As part of our continuing mission to provide you with exceptional heart care, we have created designated Provider Care Teams. These Care Teams include your primary Cardiologist (physician) and Advanced Practice Providers (APPs- Physician Assistants and Nurse Practitioners) who all work together to provide you with the care you need, when you need it.   You may see any of the following providers on your designated Care Team at your next follow up: Marland Kitchen Dr Glori Bickers . Dr Loralie Champagne . Darrick Grinder, NP . Lyda Jester, PA . Audry Riles, PharmD   Please be sure to bring in all your medications bottles to every appointment.
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
## 2019-07-09 NOTE — Progress Notes (Signed)
ICD Remote  

## 2019-07-16 ENCOUNTER — Other Ambulatory Visit: Payer: Self-pay

## 2019-07-17 ENCOUNTER — Ambulatory Visit (INDEPENDENT_AMBULATORY_CARE_PROVIDER_SITE_OTHER): Payer: Medicare HMO | Admitting: Adult Health

## 2019-07-17 ENCOUNTER — Encounter: Payer: Self-pay | Admitting: Adult Health

## 2019-07-17 VITALS — BP 130/84 | Temp 98.7°F | Ht 70.0 in | Wt 196.0 lb

## 2019-07-17 DIAGNOSIS — I480 Paroxysmal atrial fibrillation: Secondary | ICD-10-CM | POA: Diagnosis not present

## 2019-07-17 DIAGNOSIS — I472 Ventricular tachycardia, unspecified: Secondary | ICD-10-CM

## 2019-07-17 DIAGNOSIS — J439 Emphysema, unspecified: Secondary | ICD-10-CM

## 2019-07-17 DIAGNOSIS — N1831 Chronic kidney disease, stage 3a: Secondary | ICD-10-CM | POA: Diagnosis not present

## 2019-07-17 DIAGNOSIS — E118 Type 2 diabetes mellitus with unspecified complications: Secondary | ICD-10-CM | POA: Diagnosis not present

## 2019-07-17 DIAGNOSIS — I10 Essential (primary) hypertension: Secondary | ICD-10-CM | POA: Diagnosis not present

## 2019-07-17 DIAGNOSIS — N400 Enlarged prostate without lower urinary tract symptoms: Secondary | ICD-10-CM | POA: Diagnosis not present

## 2019-07-17 DIAGNOSIS — Z794 Long term (current) use of insulin: Secondary | ICD-10-CM

## 2019-07-17 DIAGNOSIS — G40909 Epilepsy, unspecified, not intractable, without status epilepticus: Secondary | ICD-10-CM | POA: Diagnosis not present

## 2019-07-17 DIAGNOSIS — E782 Mixed hyperlipidemia: Secondary | ICD-10-CM

## 2019-07-17 DIAGNOSIS — G4733 Obstructive sleep apnea (adult) (pediatric): Secondary | ICD-10-CM | POA: Diagnosis not present

## 2019-07-17 DIAGNOSIS — I5022 Chronic systolic (congestive) heart failure: Secondary | ICD-10-CM | POA: Diagnosis not present

## 2019-07-17 DIAGNOSIS — Z0000 Encounter for general adult medical examination without abnormal findings: Secondary | ICD-10-CM | POA: Diagnosis not present

## 2019-07-17 MED ORDER — JARDIANCE 10 MG PO TABS: 10.0000 mg | ORAL_TABLET | Freq: Every day | ORAL | 0 refills | Status: DC

## 2019-07-17 NOTE — Patient Instructions (Addendum)
I am going to start you on Jardiance 10 mg. Cut back to 5 units of the insulin daily and titrate down on the dose if your blood sugars are getting too low.  You can also stop Lasix  Please follow up in 2 weeks   We will follow up with you regarding your blood work
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
## 2019-07-21 ENCOUNTER — Ambulatory Visit (HOSPITAL_COMMUNITY)
Admission: RE | Admit: 2019-07-21 | Discharge: 2019-07-21 | Disposition: A | Discharge status: Home / Self Care | Payer: Medicare HMO | Source: Ambulatory Visit | Attending: Internal Medicine | Admitting: Internal Medicine

## 2019-07-21 ENCOUNTER — Other Ambulatory Visit: Payer: Self-pay

## 2019-07-21 DIAGNOSIS — I110 Hypertensive heart disease with heart failure: Secondary | ICD-10-CM | POA: Insufficient documentation

## 2019-07-21 DIAGNOSIS — I252 Old myocardial infarction: Secondary | ICD-10-CM | POA: Insufficient documentation

## 2019-07-21 DIAGNOSIS — I5022 Chronic systolic (congestive) heart failure: Secondary | ICD-10-CM | POA: Diagnosis not present

## 2019-07-21 DIAGNOSIS — J449 Chronic obstructive pulmonary disease, unspecified: Secondary | ICD-10-CM | POA: Diagnosis not present

## 2019-07-21 DIAGNOSIS — Z95810 Presence of automatic (implantable) cardiac defibrillator: Secondary | ICD-10-CM | POA: Insufficient documentation

## 2019-07-21 DIAGNOSIS — E785 Hyperlipidemia, unspecified: Secondary | ICD-10-CM | POA: Insufficient documentation

## 2019-07-21 DIAGNOSIS — I4891 Unspecified atrial fibrillation: Secondary | ICD-10-CM | POA: Diagnosis not present

## 2019-07-21 DIAGNOSIS — E119 Type 2 diabetes mellitus without complications: Secondary | ICD-10-CM | POA: Diagnosis not present

## 2019-07-21 NOTE — Progress Notes (Signed)
  Echocardiogram 2D Echocardiogram has been performed.  [name] 07/21/2019, 8:53 AM

## 2019-07-31 ENCOUNTER — Other Ambulatory Visit: Payer: Self-pay

## 2019-07-31 ENCOUNTER — Ambulatory Visit: Payer: Medicare HMO | Admitting: Adult Health

## 2019-08-01 ENCOUNTER — Telehealth: Payer: Self-pay | Admitting: Adult Health

## 2019-08-01 ENCOUNTER — Encounter: Payer: Self-pay | Admitting: Adult Health

## 2019-08-01 ENCOUNTER — Ambulatory Visit (INDEPENDENT_AMBULATORY_CARE_PROVIDER_SITE_OTHER): Payer: Medicare HMO | Admitting: Adult Health

## 2019-08-01 VITALS — BP 106/62 | Temp 97.9°F | Wt 193.0 lb

## 2019-08-01 DIAGNOSIS — E118 Type 2 diabetes mellitus with unspecified complications: Secondary | ICD-10-CM

## 2019-08-01 DIAGNOSIS — I10 Essential (primary) hypertension: Secondary | ICD-10-CM | POA: Diagnosis not present

## 2019-08-01 DIAGNOSIS — Z794 Long term (current) use of insulin: Secondary | ICD-10-CM | POA: Diagnosis not present

## 2019-08-01 LAB — LIPID PANEL
Cholesterol: 115 mg/dL (ref 0–200)
HDL: 27.3 mg/dL — ABNORMAL LOW (ref >39.00)
LDL Cholesterol: 61 mg/dL (ref 0–99)
NonHDL: 87.6
Total CHOL/HDL Ratio: 4
Triglycerides: 134 mg/dL (ref 0.0–149.0)
VLDL: 26.8 mg/dL (ref 0.0–40.0)

## 2019-08-01 LAB — MICROALBUMIN / CREATININE URINE RATIO
Creatinine,U: 107.7 mg/dL
Microalb Creat Ratio: 0.7 mg/g (ref 0.0–30.0)
Microalb, Ur: <0.7 mg/dL (ref 0.0–1.9)

## 2019-08-01 LAB — CBC WITH DIFFERENTIAL/PLATELET
Basophils Absolute: 0.1 10*3/uL (ref 0.0–0.1)
Basophils Relative: 0.9 % (ref 0.0–3.0)
Eosinophils Absolute: 0.1 10*3/uL (ref 0.0–0.7)
Eosinophils Relative: 2.2 % (ref 0.0–5.0)
HCT: 39.8 % (ref 39.0–52.0)
Hemoglobin: 13.6 g/dL (ref 13.0–17.0)
Lymphocytes Relative: 31.7 % (ref 12.0–46.0)
Lymphs Abs: 1.8 10*3/uL (ref 0.7–4.0)
MCHC: 34.1 g/dL (ref 30.0–36.0)
MCV: 91.2 fl (ref 78.0–100.0)
Monocytes Absolute: 0.6 10*3/uL (ref 0.1–1.0)
Monocytes Relative: 10.1 % (ref 3.0–12.0)
Neutro Abs: 3.2 10*3/uL (ref 1.4–7.7)
Neutrophils Relative %: 55.1 % (ref 43.0–77.0)
Platelets: 241 10*3/uL (ref 150.0–400.0)
RBC: 4.37 Mil/uL (ref 4.22–5.81)
RDW: 13.7 % (ref 11.5–15.5)
WBC: 5.8 10*3/uL (ref 4.0–10.5)

## 2019-08-01 LAB — COMPREHENSIVE METABOLIC PANEL
ALT: 20 U/L (ref 0–53)
AST: 22 U/L (ref 0–37)
Albumin: 4.3 g/dL (ref 3.5–5.2)
Alkaline Phosphatase: 50 U/L (ref 39–117)
BUN: 21 mg/dL (ref 6–23)
CO2: 29 mEq/L (ref 19–32)
Calcium: 9.8 mg/dL (ref 8.4–10.5)
Chloride: 104 mEq/L (ref 96–112)
Creatinine, Ser: 1.89 mg/dL — ABNORMAL HIGH (ref 0.40–1.50)
GFR: 42.52 mL/min — ABNORMAL LOW (ref >60.00)
Glucose, Bld: 106 mg/dL — ABNORMAL HIGH (ref 70–99)
Potassium: 4.3 mEq/L (ref 3.5–5.1)
Sodium: 137 mEq/L (ref 135–145)
Total Bilirubin: 0.4 mg/dL (ref 0.2–1.2)
Total Protein: 7 g/dL (ref 6.0–8.3)

## 2019-08-01 LAB — HEMOGLOBIN A1C: Hgb A1c MFr Bld: 7.1 % — ABNORMAL HIGH (ref 4.6–6.5)

## 2019-08-01 LAB — TSH: TSH: 1.28 u[IU]/mL (ref 0.35–4.50)

## 2019-08-01 NOTE — Telephone Encounter (Signed)
Updated patient on his labs  
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
## 2019-08-07 ENCOUNTER — Other Ambulatory Visit: Payer: Self-pay | Admitting: Internal Medicine

## 2019-08-07 DIAGNOSIS — W57XXXA Bitten or stung by nonvenomous insect and other nonvenomous arthropods, initial encounter: Secondary | ICD-10-CM

## 2019-08-11 ENCOUNTER — Ambulatory Visit (INDEPENDENT_AMBULATORY_CARE_PROVIDER_SITE_OTHER): Payer: Medicare HMO | Admitting: Internal Medicine

## 2019-08-11 ENCOUNTER — Other Ambulatory Visit: Payer: Self-pay

## 2019-08-11 ENCOUNTER — Encounter: Payer: Self-pay | Admitting: Internal Medicine

## 2019-08-11 VITALS — BP 124/70 | HR 56 | Ht 70.0 in | Wt 190.4 lb

## 2019-08-11 DIAGNOSIS — I480 Paroxysmal atrial fibrillation: Secondary | ICD-10-CM

## 2019-08-11 DIAGNOSIS — Z95810 Presence of automatic (implantable) cardiac defibrillator: Secondary | ICD-10-CM

## 2019-08-11 DIAGNOSIS — I255 Ischemic cardiomyopathy: Secondary | ICD-10-CM

## 2019-08-11 DIAGNOSIS — I472 Ventricular tachycardia, unspecified: Secondary | ICD-10-CM

## 2019-08-11 DIAGNOSIS — I2510 Atherosclerotic heart disease of native coronary artery without angina pectoris: Secondary | ICD-10-CM | POA: Diagnosis not present

## 2019-08-11 LAB — CUP PACEART INCLINIC DEVICE CHECK
Brady Statistic RA Percent Paced: 25 %
Brady Statistic RV Percent Paced: 1 %
Date Time Interrogation Session: 20210322152916
HighPow Impedance: 43 Ohm
HighPow Impedance: 54 Ohm
Implantable Lead Implant Date: 20050922
Implantable Lead Implant Date: 20050922
Implantable Lead Location: 753859
Implantable Lead Location: 753860
Implantable Lead Model: 158
Implantable Lead Model: 5076
Implantable Lead Serial Number: 156891
Implantable Pulse Generator Implant Date: 20100817
Lead Channel Impedance Value: 457 Ohm
Lead Channel Impedance Value: 502 Ohm
Lead Channel Pacing Threshold Amplitude: 0.6 V
Lead Channel Pacing Threshold Amplitude: 1.2 V
Lead Channel Pacing Threshold Pulse Width: 0.4 ms
Lead Channel Pacing Threshold Pulse Width: 0.4 ms
Lead Channel Sensing Intrinsic Amplitude: 19.6 mV
Lead Channel Sensing Intrinsic Amplitude: 3.3 mV
Lead Channel Setting Pacing Amplitude: 2 V
Lead Channel Setting Pacing Amplitude: 2.4 V
Lead Channel Setting Pacing Pulse Width: 0.4 ms
Lead Channel Setting Sensing Sensitivity: 0.6 mV
Pulse Gen Serial Number: 141895
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
## 2019-08-11 NOTE — Patient Instructions (Signed)
Medication Instructions:  Your physician recommends that you continue on your current medications as directed. Please refer to the Current Medication list given to you today.  *If you need a refill on your cardiac medications before your next appointment, please call your pharmacy*   Lab Work: None ordered   Testing/Procedures: None ordered   Follow-Up: At [facility], you and your health needs are our priority.  As part of our continuing mission to provide you with exceptional heart care, we have created designated Provider Care Teams.  These Care Teams include your primary Cardiologist (physician) and Advanced Practice Providers (APPs -  Physician Assistants and Nurse Practitioners) who all work together to provide you with the care you need, when you need it.  We recommend signing up for the patient portal called "MyChart".  Sign up information is provided on this After Visit Summary.  MyChart is used to connect with patients for Virtual Visits (Telemedicine).  Patients are able to view lab/test results, encounter notes, upcoming appointments, etc.  Non-urgent messages can be sent to your provider as well.   To learn more about what you can do with MyChart, go to [email or web address].    Your next appointment:   6 month(s)  The format for your next appointment:   Either In Person or Virtual  Provider:   [name], MD   Other Instructions

## 2019-08-13 ENCOUNTER — Other Ambulatory Visit: Payer: Self-pay

## 2019-08-13 ENCOUNTER — Encounter (HOSPITAL_COMMUNITY): Payer: Self-pay | Admitting: Emergency Medicine

## 2019-08-13 ENCOUNTER — Emergency Department (HOSPITAL_COMMUNITY)
Admission: EM | Admit: 2019-08-13 | Discharge: 2019-08-13 | Disposition: A | Discharge status: Home / Self Care | Payer: Medicare HMO | Attending: Emergency Medicine | Admitting: Emergency Medicine

## 2019-08-13 ENCOUNTER — Emergency Department (HOSPITAL_COMMUNITY): Payer: Medicare HMO

## 2019-08-13 ENCOUNTER — Telehealth: Payer: Self-pay

## 2019-08-13 DIAGNOSIS — Y9289 Other specified places as the place of occurrence of the external cause: Secondary | ICD-10-CM | POA: Diagnosis not present

## 2019-08-13 DIAGNOSIS — R55 Syncope and collapse: Secondary | ICD-10-CM

## 2019-08-13 DIAGNOSIS — Y999 Unspecified external cause status: Secondary | ICD-10-CM | POA: Diagnosis not present

## 2019-08-13 DIAGNOSIS — Z87891 Personal history of nicotine dependence: Secondary | ICD-10-CM | POA: Insufficient documentation

## 2019-08-13 DIAGNOSIS — Z7901 Long term (current) use of anticoagulants: Secondary | ICD-10-CM | POA: Insufficient documentation

## 2019-08-13 DIAGNOSIS — Z95810 Presence of automatic (implantable) cardiac defibrillator: Secondary | ICD-10-CM | POA: Diagnosis not present

## 2019-08-13 DIAGNOSIS — I5022 Chronic systolic (congestive) heart failure: Secondary | ICD-10-CM | POA: Diagnosis not present

## 2019-08-13 DIAGNOSIS — I130 Hypertensive heart and chronic kidney disease with heart failure and stage 1 through stage 4 chronic kidney disease, or unspecified chronic kidney disease: Secondary | ICD-10-CM | POA: Diagnosis not present

## 2019-08-13 DIAGNOSIS — E1122 Type 2 diabetes mellitus with diabetic chronic kidney disease: Secondary | ICD-10-CM | POA: Diagnosis not present

## 2019-08-13 DIAGNOSIS — Y9389 Activity, other specified: Secondary | ICD-10-CM | POA: Diagnosis not present

## 2019-08-13 DIAGNOSIS — W312XXA Contact with powered woodworking and forming machines, initial encounter: Secondary | ICD-10-CM | POA: Insufficient documentation

## 2019-08-13 DIAGNOSIS — S6992XA Unspecified injury of left wrist, hand and finger(s), initial encounter: Secondary | ICD-10-CM | POA: Diagnosis present

## 2019-08-13 DIAGNOSIS — S61412A Laceration without foreign body of left hand, initial encounter: Secondary | ICD-10-CM | POA: Diagnosis not present

## 2019-08-13 DIAGNOSIS — N183 Chronic kidney disease, stage 3 unspecified: Secondary | ICD-10-CM | POA: Diagnosis not present

## 2019-08-13 LAB — COMPREHENSIVE METABOLIC PANEL
ALT: 20 U/L (ref 0–44)
AST: 24 U/L (ref 15–41)
Albumin: 4.6 g/dL (ref 3.5–5.0)
Alkaline Phosphatase: 38 U/L (ref 38–126)
Anion gap: 6 (ref 5–15)
BUN: 25 mg/dL — ABNORMAL HIGH (ref 8–23)
CO2: 26 mmol/L (ref 22–32)
Calcium: 9.8 mg/dL (ref 8.9–10.3)
Chloride: 107 mmol/L (ref 98–111)
Creatinine, Ser: 2.13 mg/dL — ABNORMAL HIGH (ref 0.61–1.24)
GFR calc Af Amer: 35 mL/min — ABNORMAL LOW (ref >60)
GFR calc non Af Amer: 30 mL/min — ABNORMAL LOW (ref >60)
Glucose, Bld: 119 mg/dL — ABNORMAL HIGH (ref 70–99)
Potassium: 3.9 mmol/L (ref 3.5–5.1)
Sodium: 139 mmol/L (ref 135–145)
Total Bilirubin: 0.7 mg/dL (ref 0.3–1.2)
Total Protein: 7.5 g/dL (ref 6.5–8.1)

## 2019-08-13 LAB — CBC
HCT: 43.1 % (ref 39.0–52.0)
Hemoglobin: 14 g/dL (ref 13.0–17.0)
MCH: 30.8 pg (ref 26.0–34.0)
MCHC: 32.5 g/dL (ref 30.0–36.0)
MCV: 94.9 fL (ref 80.0–100.0)
Platelets: 258 10*3/uL (ref 150–400)
RBC: 4.54 MIL/uL (ref 4.22–5.81)
RDW: 13.4 % (ref 11.5–15.5)
WBC: 6.5 10*3/uL (ref 4.0–10.5)
nRBC: 0 % (ref 0.0–0.2)

## 2019-08-13 LAB — PROTIME-INR
INR: 1.2 (ref 0.8–1.2)
Prothrombin Time: 15.5 seconds — ABNORMAL HIGH (ref 11.4–15.2)

## 2019-08-13 LAB — TROPONIN I (HIGH SENSITIVITY)
Troponin I (High Sensitivity): 6 ng/L (ref <18)
Troponin I (High Sensitivity): 7 ng/L (ref <18)

## 2019-08-13 LAB — CBG MONITORING, ED: Glucose-Capillary: 99 mg/dL (ref 70–99)

## 2019-08-13 LAB — APTT: aPTT: 25 seconds (ref 24–36)

## 2019-08-13 MED ORDER — LIDOCAINE HCL (PF) 1 % IJ SOLN
30.0000 mL | Freq: Once | INTRAMUSCULAR | Status: AC
  Administered 2019-08-13: 30 mL
  Filled 2019-08-13: qty 30

## 2019-08-13 MED ORDER — SODIUM CHLORIDE 0.9 % IV BOLUS
500.0000 mL | Freq: Once | INTRAVENOUS | Status: AC
  Administered 2019-08-13: 500 mL via INTRAVENOUS

## 2019-08-13 NOTE — Progress Notes (Signed)
Paged to assess pt device interrogation. Pt in for hand laceration and reports being shocked by his ICD.  Interrogation received and reviewed.  He has had no recent VT or therapies for VT. NSVT episode listed below appears to be a short run SVT with A > V  Would re-assure patient and keep scheduled follow up.   K 3.9. Cr slightly elevated. Note that IVF bolus was given.   Initial HS trop 6.   Nothing further from cardiology at this time with current data.   Legrand Como [address]" [place], PA-C  08/13/2019 3:50 PM

## 2019-08-13 NOTE — ED Notes (Signed)
Pacemaker interrogated, machine read data sent.

## 2019-08-13 NOTE — Telephone Encounter (Signed)
Spoke with pt. Spouse, she states she was not allowed to stay with pt.  He is currently at [facility] ED due to a hand laceration.  Pt called her to tell her that his ICD dsicharged while he was there. Per spouse pt felt sick to his stomach prior to shock.  Spoke with [facility] rep, Joey, he advised [facility] can interrogate device and will contact him if necessary.  Verified on EMR that pt did inform the ED staff of discharge.

## 2019-08-13 NOTE — ED Provider Notes (Signed)
Signout from Dr. Sedonia Small.  74 year old male history of V. tach AICD here with hand injury that was repaired.  During his ED visit he became diaphoretic and likely had an AICD discharge.  Cardiology in the loop.  Awaiting AICD interrogation and cardiology recommendations.  Disposition per results of these. Physical Exam  BP 123/67   Pulse (!) 58   Temp 98.7 F (37.1 C) (Oral)   Resp 11   Ht 5\' 10"  (1.778 m)   Wt 86.4 kg   SpO2 100%   BMI 27.32 kg/m   Physical Exam  ED Course/Procedures     Procedures  MDM  Received call from cardiology EP.  They reviewed the interrogation of the AICD and he did not have a V. tach episode or received a shock today.  They said last week there was a short episode of some SVT but otherwise nothing happened now.  They do not see any indication for admission.  Delta troponin minimally changed. Patient asymptomatic. Reviewed results with him and he is comfortable with plan for discharge. Understands need for cards followup and to return if any worsening symptoms.        Hayden Rasmussen, MD 08/14/19 [phone]

## 2019-08-13 NOTE — Telephone Encounter (Signed)
The pt went to [place] [facility] because he cut his hand and while he was there he got shocked by his ICD. I let the nurse Amy, rn know. She called Joey and the pt wife back.
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
## 2019-08-13 NOTE — Discharge Instructions (Addendum)
You were evaluated in the Emergency Department and after careful evaluation, we did not find any emergent condition requiring admission or further testing in the hospital.  The interrogation of your AICD did not show any abnormal rhythms and no shocks occurred today.  Please follow-up with your cardiologist.  Your stitches will need to be removed in 5 to 7 days by healthcare professional.  Please return to the Emergency Department if you experience any worsening of your condition.  We encourage you to follow up with a primary care provider.  Thank you for allowing [place] to be a part of your care.
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
## 2019-08-14 ENCOUNTER — Other Ambulatory Visit: Payer: Self-pay

## 2019-08-15 ENCOUNTER — Ambulatory Visit (INDEPENDENT_AMBULATORY_CARE_PROVIDER_SITE_OTHER): Payer: Medicare HMO | Admitting: Family Medicine

## 2019-08-15 ENCOUNTER — Encounter: Payer: Self-pay | Admitting: Family Medicine

## 2019-08-15 VITALS — BP 124/76 | HR 106 | Temp 98.4°F | Wt 189.0 lb

## 2019-08-15 DIAGNOSIS — L03114 Cellulitis of left upper limb: Secondary | ICD-10-CM | POA: Diagnosis not present

## 2019-08-15 DIAGNOSIS — S61412D Laceration without foreign body of left hand, subsequent encounter: Secondary | ICD-10-CM | POA: Diagnosis not present

## 2019-08-15 MED ORDER — CEPHALEXIN 500 MG PO CAPS: 500.0000 mg | ORAL_CAPSULE | Freq: Three times a day (TID) | ORAL | 0 refills | Status: DC
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
## 2019-08-15 NOTE — Patient Instructions (Signed)
Clean gently once daily with soap and water  Apply small amount of vaseline over the wound  Start the antibiotic and take three times daily.    Get the stiches out in about one week.

## 2019-08-22 ENCOUNTER — Encounter: Payer: Medicare HMO | Admitting: Internal Medicine

## 2019-08-26 ENCOUNTER — Telehealth: Payer: Self-pay | Admitting: Adult Health

## 2019-08-26 NOTE — Chronic Care Management (AMB) (Signed)
  Chronic Care Management   Note  08/26/2019 Name: Forster Pamer MRN: [id number] DOB: 07-12-1945  Adrik Panno is a 74 y.o. year old male who is a primary care patient of Dorothyann Peng, NP. I reached out to [name] by phone today in response to a referral sent by Mr. Kyton Almanza's PCP, Dorothyann Peng, NP.   Mr. Walling was given information about Chronic Care Management services today including:  1. CCM service includes personalized support from designated clinical staff supervised by his physician, including individualized plan of care and coordination with other care providers 2. 24/7 contact phone numbers for assistance for urgent and routine care needs. 3. Service will only be billed when office clinical staff spend 20 minutes or more in a month to coordinate care. 4. Only one practitioner may furnish and bill the service in a calendar month. 5. The patient may stop CCM services at any time (effective at the end of the month) by phone call to the office staff.   Patient agreed to services and verbal consent obtained.   Follow up plan:   Raynicia Dukes [employer] Scheduler

## 2019-08-27 ENCOUNTER — Encounter: Payer: Self-pay | Admitting: Adult Health

## 2019-08-27 ENCOUNTER — Other Ambulatory Visit: Payer: Self-pay

## 2019-08-27 ENCOUNTER — Ambulatory Visit (INDEPENDENT_AMBULATORY_CARE_PROVIDER_SITE_OTHER): Payer: Medicare HMO | Admitting: Adult Health

## 2019-08-27 VITALS — BP 136/82 | Temp 97.9°F | Wt 188.0 lb

## 2019-08-27 DIAGNOSIS — Z4802 Encounter for removal of sutures: Secondary | ICD-10-CM | POA: Diagnosis not present
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
## 2019-09-01 DIAGNOSIS — I4891 Unspecified atrial fibrillation: Secondary | ICD-10-CM | POA: Diagnosis not present

## 2019-09-01 DIAGNOSIS — I10 Essential (primary) hypertension: Secondary | ICD-10-CM | POA: Diagnosis not present

## 2019-09-01 DIAGNOSIS — I255 Ischemic cardiomyopathy: Secondary | ICD-10-CM | POA: Diagnosis not present

## 2019-09-01 DIAGNOSIS — N183 Chronic kidney disease, stage 3 unspecified: Secondary | ICD-10-CM | POA: Diagnosis not present

## 2019-09-08 ENCOUNTER — Other Ambulatory Visit (HOSPITAL_COMMUNITY): Payer: Self-pay | Admitting: Internal Medicine

## 2019-09-12 ENCOUNTER — Other Ambulatory Visit: Payer: Self-pay | Admitting: Adult Health

## 2019-09-12 DIAGNOSIS — I480 Paroxysmal atrial fibrillation: Secondary | ICD-10-CM

## 2019-09-12 DIAGNOSIS — E1165 Type 2 diabetes mellitus with hyperglycemia: Secondary | ICD-10-CM

## 2019-09-12 DIAGNOSIS — I5022 Chronic systolic (congestive) heart failure: Secondary | ICD-10-CM

## 2019-09-18 ENCOUNTER — Ambulatory Visit: Payer: Medicare HMO

## 2019-09-18 ENCOUNTER — Other Ambulatory Visit: Payer: Self-pay

## 2019-09-18 DIAGNOSIS — J439 Emphysema, unspecified: Secondary | ICD-10-CM

## 2019-09-18 DIAGNOSIS — G40909 Epilepsy, unspecified, not intractable, without status epilepticus: Secondary | ICD-10-CM

## 2019-09-18 DIAGNOSIS — Z794 Long term (current) use of insulin: Secondary | ICD-10-CM

## 2019-09-18 DIAGNOSIS — N400 Enlarged prostate without lower urinary tract symptoms: Secondary | ICD-10-CM

## 2019-09-18 DIAGNOSIS — I10 Essential (primary) hypertension: Secondary | ICD-10-CM

## 2019-09-18 DIAGNOSIS — I480 Paroxysmal atrial fibrillation: Secondary | ICD-10-CM

## 2019-09-18 DIAGNOSIS — I5022 Chronic systolic (congestive) heart failure: Secondary | ICD-10-CM

## 2019-09-18 DIAGNOSIS — I2510 Atherosclerotic heart disease of native coronary artery without angina pectoris: Secondary | ICD-10-CM

## 2019-09-18 DIAGNOSIS — E118 Type 2 diabetes mellitus with unspecified complications: Secondary | ICD-10-CM

## 2019-09-18 NOTE — Chronic Care Management (AMB) (Signed)
Chronic Care Management Pharmacy  Name: [name]  MRN: [id number] DOB: Jun 11, 1945  Initial Questions: 1. Have you seen any other providers since your last visit? NA 2. Any changes in your medicines or health? No   Chief Complaint/ HPI  [name],  74 y.o. , male presents for their Initial CCM visit with the clinical pharmacist via telephone due to COVID-19 Pandemic.  PCP : Dorothyann Peng, NP  Their chronic conditions include: HTN, diabetes, atrial fibrillation, heart failure, coronary artery disease, COPD, seizure, and BPH   Office Visits: 08/27/2019- Dorothyann Peng, NP- Patient presented for office visit for suture removal. Patient has 8 sutures on dorsum aspect of left hand from injury of reciprocating saw. 8 sutures were removed. No signs of infections noted.   08/15/2019- Carolann Littler, MD- Patient presented for office visit for follow up on laceration on left hand. Patient concerned for early infection. Assessment: some early mild cellulitis changes. Patient prescribed Keflex 573m 3 times daily for 7 days. Patient to follow up for worsening symptoms. Patient to follow up in 1 week for sutures removal.   08/01/2019- CDorothyann Peng NP- Patient presented for office visit for 2 week follow up for BP and DM. At last visit, Lasix was stopped. For DM, Jardiance was started and Lantus dose was decreased.  Reported BGs: 100-130. Patient to continue current regimen and follow up in 3 months.   07/17/2019- CDorothyann Peng NP- Patient presented for office visit for annual exam. Per suggestion from cardiologist, patient to start Jardiance and discontinue Lasix. Lantus dose decreased from 10 units to 5 units and titrate as needed. Labs ordered: CBC, CMP, A1c, lipid panel, TSH. Patient to follow up in 2 weeks.   Consult Visit: 08/11/2019- Cardiology- JThompson Grayer MD- Patient presented for office visit for routine electrophysiology follow up. Patient presented to be stable. No changes in  medication regimen. Follow up in 6 months.   07/09/2019- Cardiology- DGlori Bickers MD- Patient presented for office visit for follow up. Patient presented to be stable. No changes made. Patient to discuss with PCP in regards to [place] If started, Lasix to be discontinued.   Medications: Outpatient Encounter Medications as of 09/18/2019  Medication Sig  . BIDIL 20-37.5 MG tablet TAKE 2 TABLETS BY MOUTH 2 (TWO) TIMES DAILY. (Patient taking differently: Take 2 tablets by mouth in the morning and at bedtime. )  . Blood Glucose Monitoring Suppl (ONETOUCH VERIO FLEX SYSTEM) w/Device KIT USE TO TEST BLOOD GLUCOSE THREE TIMES DAILY  . EASY COMFORT PEN NEEDLES 31G X 5 MM MISC USE DAILY AT BEDTIME.  .Marland KitchenELIQUIS 5 MG TABS tablet TAKE 1 TABLET (5 MG TOTAL) BY MOUTH 2 (TWO) TIMES DAILY. (Patient taking differently: Take 5 mg by mouth 2 (two) times daily. )  . empagliflozin (JARDIANCE) 10 MG TABS tablet Take 10 mg by mouth daily before breakfast.  . ENTRESTO 97-103 MG TAKE 1 TABLET BY MOUTH 2 (TWO) TIMES DAILY. (Patient taking differently: Take 1 tablet by mouth 2 (two) times daily. )  . fenofibrate 160 MG tablet TAKE 1 TABLET BY MOUTH EVERY EVENING.  . finasteride (PROSCAR) 5 MG tablet TAKE ONE TABLET BY MOUTH ONCE DAILY  . Insulin Glargine (LANTUS SOLOSTAR) 100 UNIT/ML Solostar Pen USE 10-12 UNITS INTO THE SKIN AT BEDTIME.  . memantine (NAMENDA) 10 MG tablet Take 1 tablet (10 mg total) by mouth 2 (two) times daily. TAKE 1 TABLET EVERY NIGHT FOR 2 WEEKS, THEN INCREASE TO 1 TABLET TWICE A DAY  . metoprolol succinate (  TOPROL-XL) 25 MG 24 hr tablet TAKE 1 TABLET (25 MG TOTAL) BY MOUTH EVERY MORNING. (Patient taking differently: Take 25 mg by mouth daily. )  . metoprolol succinate (TOPROL-XL) 50 MG 24 hr tablet TAKE 1 TABLET (50 MG TOTAL) BY MOUTH EVERY EVENING. TAKE WITH OR IMMEDIATELY FOLLOWING A MEAL.  . mirabegron ER (MYRBETRIQ) 25 MG TB24 tablet Take 25 mg by mouth daily.   Glory Rosebush VERIO test strip  Use to test blood glucose TID.  Medically Necessary.  Use Medicare Part B.  . ranolazine (RANEXA) 500 MG 12 hr tablet TAKE 1 TABLET (500 MG TOTAL) BY MOUTH DAILY.  . rosuvastatin (CRESTOR) 40 MG tablet TAKE 1 TABLET (40 MG TOTAL) BY MOUTH EVERY EVENING.  . sotalol (BETAPACE) 80 MG tablet TAKE 1 TABLET (80 MG TOTAL) BY MOUTH DAILY.  . tamsulosin (FLOMAX) 0.4 MG CAPS capsule Take 0.4 mg by mouth at bedtime.   . Tiotropium Bromide Monohydrate (SPIRIVA RESPIMAT) 2.5 MCG/ACT AERS Inhale 2 puffs into the lungs daily.  Marland Kitchen VIMPAT 100 MG TABS TAKE ONE TABLET BY MOUTH TWICE DAILY  . cephALEXin (KEFLEX) 500 MG capsule Take 1 capsule (500 mg total) by mouth 3 (three) times daily. (Patient not taking: Reported on 09/18/2019)  . levocetirizine (XYZAL) 5 MG tablet TAKE 1 TABLET (5 MG TOTAL) BY MOUTH EVERY EVENING.  . nitroGLYCERIN (NITROSTAT) 0.4 MG SL tablet PLACE 1 TABLET (0.4 MG TOTAL) UNDER THE TONGUE EVERY 5 (FIVE) MINUTES FOR 3 DOSES AS NEEDED FOR CHEST PAIN. (Patient taking differently: Place 0.4 mg under the tongue every 5 (five) minutes as needed for chest pain. )  . [DISCONTINUED] albuterol (PROVENTIL HFA;VENTOLIN HFA) 108 (90 Base) MCG/ACT inhaler Inhale 1-2 puffs into the lungs every 4 (four) hours as needed for wheezing or shortness of breath (or cough). (Patient not taking: Reported on 09/18/2019)   No facility-administered encounter medications on file as of 09/18/2019.     Current Diagnosis/Assessment:  Goals Addressed            This Visit's Progress   . Pharmacy Care Plan       CARE PLAN ENTRY  Current Barriers:  . Chronic Disease Management support, education, and care coordination needs related to Hypertension, Diabetes, Atrial Fibrillation, Heart Failure, Coronary Artery Disease, COPD, Hypothyroidism, and seizure, and BPH   Hypertension . Pharmacist Clinical Goal(s): o Over the next 180 days, patient will work with PharmD and providers to maintain BP goal < 140/90 . Current  regimen:   Metoprolol 20m, 1 tablet once daily  Metoprolol 574m 1 tablet once daily . Interventions: o Discussed diet and exercise and affect on blood pressure . Patient self care activities - Over the next 180 days, patient will: o Check BP 3 to 5 times per week, document, and provide at future appointments o Ensure daily salt intake < 2300 mg/day  Hyperlipidemia/ coronary artery disease . Pharmacist Clinical Goal(s): o Over the next 180 days, patient will work with PharmD and providers to maintain LDL goal < 70 . Current regimen:   Rosuvastatin 4024m1 tablet once daily  Fenofibrate 160m64m tablet every evening  . Interventions: . We discussed how a diet high in plant sterols (fruits/vegetables/nuts/whole grains/legumes) may reduce your cholesterol.  Encouraged increasing fiber to a daily intake of 10-25g/day  . Patient self care activities - Over the next 180 days, patient will: o Continue to work on lifestyle modifications (exercise/ diet).   Heart Failure . Pharmacist Clinical Goal(s) o Over the next 180 days,  patient will continue to monitor weight.  . Current regimen:   Metoprolol 548m, 1 tablet once daily  Metoprolol 518m 1 tablet once daily   Bidil 20-37.48m71m2 tablet twice times daily  Entresto 97/103m48m tablet twice daily . Interventions: o We discussed weighing daily; if you gain more than 3 pounds in one day or 5 pounds in one week call your doctor . Patient self care activities o Patient to continue current medications and daily weights.    Diabetes . Pharmacist Clinical Goal(s): o Over the next 180 days, patient will work with PharmD and providers to maintain A1c goal < 7.0 . Current regimen:   empagliflozin (Jardiance) 10mg54mtablet once daily before breakfast  Insulin glargine (Lantus Solostar), use 5 units at bedtime  . Interventions: o We discussed: how to recognize and treat signs of hypoglycemia. . Patient self care activities - Over the  next 180 days, patient will: o Check blood sugar daily, document, and provide at future appointments o Contact provider with any episodes of hypoglycemia  Atrial fibrillation . Current regimen:  o Sotalol 80mg,16mablet once daily o Eliquis 48mg, 1107mblet twice daily  . Interventions: o We discussed:  monitoring for signs and symptoms for bleeding (coughing up blood, prolonged nose bleeds, black, tarry stools). . Patient self care activities o Patient will continue current medications.   COPD . Pharmacist Clinical Goal(s) o Over the next 180 days, Prevent worsening of shortness of breath and hospitalizations. . Current regimen:   Albuterol HFA 108mcg/a62mnhaler, 1 to 2 puffs every four hours as needed for wheezing or shortness of breath  Spiriva Respimat 2.48mcg/ ac38mnhaler, 2 puffs once daily . Interventions: o [facility] o Patient will continue current medications.   Ventricular tachycardia . Current regimen:   Sotalol 80mg, 1 t48mt once daily  Ranolazine (Ranexa) 500mg, 1 ta8m once daily . Patient self care activities  o Patient will continue current medications.  Seizure disorder . Current regimen:  o Vimpat 100mg, 1 tab23mtwice daily . Patient self care activities o Patient will continue current medications.  BPH . Current regimen:   Finasteride 48mg, 1 table82mnce daily  tamsulosin 0.4mg, 1 capsul68mnce daily  mirabegron ER 148mg, 1 tablet70me daily . Patient self care activities o Patient will continue current medications and follow up visits with Dr. Martin Webb  MeEdrick Oh Current regimen:  o memantine 10mg, 1 tablet 54me daily  . Patient self care activities o Patient will continue current medications and follow up visits with Dr. Karen Aquino.   Ellouise Newermanagement . Pharmacist Clinical Goal(s): o Over the next 180 days, patient will work with PharmD and providers  to maintain optimal medication adherence . Current pharmacy: [facility] . Interventions o Comprehensive medication review performed. o Continue current medication management strategy . Patient self care activities  o Take medications as prescribed o Report any questions or concerns to PharmD and/or provider(s)  Initial goal documentation       SDOH Interventions     Most Recent Value  SDOH Interventions  SDOH Interventions for the Following Domains  Financial Strain  Financial Strain Interventions  Other (Comment) [Not needed]       AFIB  Patient is currently rhythm controlled.  Patient is currently controlled on the following medications:   Sotalol 80mg, 1 tablet o72mdaily  Anticoagulation CHA2DS2/VASc: 7   Eliquis 48mg, 1 tablet53m  twice daily   We discussed:  monitoring for signs and symptoms for bleeding (coughing up blood, prolonged nose bleeds, black, tarry stools).   Plan Continue current medications  COPD    Last spirometry score: FVC: 100%, FEV1: 95%, FEV1/FVC: 94%    Gold Grade: Gold 1 (FEV1>80%)  Eosinophil count:   Lab Results  Component Value Date/Time   EOSPCT 2.2 08/01/2019 11:25 AM  %                               Eos (Absolute):  Lab Results  Component Value Date/Time   EOSABS 0.1 08/01/2019 11:25 AM   EOSABS 0.1 12/25/2016 12:11 PM    Tobacco Status:  Social History   Tobacco Use  Smoking Status Former Smoker  . Packs/day: 2.00  . Years: 23.00  . Pack years: 46.00  . Types: Cigarettes  . Start date: 08/21/1961  . Quit date: 01/03/1985  . Years since quitting: 34.7  Smokeless Tobacco Never Used   Patient has failed these meds in past: Symbicort   Patient is currently controlled on the following medications:   Albuterol HFA 192mg/act inhaler, 1 to 2 puffs every four hours as needed for wheezing or shortness of breath  Spiriva Respimat 2.562m/ act inhaler, 2 puffs once daily  Using maintenance inhaler regularly?  Yes Frequency of rescue inhaler use:  prn  We discussed:  proper inhaler technique  [facility] Continue current medications  Diabetes  Recent Relevant Labs: Lab Results  Component Value Date/Time   HGBA1C 7.1 (H) 08/01/2019 11:25 AM   HGBA1C 7.5 (H) 04/15/2019 01:52 PM   HGBA1C 7.8 01/04/2018 02:18 PM   MICROALBUR <0.7 08/01/2019 11:25 AM    Checking BG: Daily  Recent BG Readings:(fasting and bedtime) 130, 130 129, 148 119, 166 133, 139 89, 130 98, 144 190, 166-> forgot to use insulin.   Patient is currently controlled on the following medications:   empagliflozin (Jardiance) 1013m1 tablet once daily before breakfast  Insulin glargine (Lantus Solostar), use 5 units at bedtime   Last diabetic Eye exam: No results found for: HMDIABEYEEXA  - last eye exam was in January  Last diabetic Foot exam: No results found for: HMDIABFOOTEX  - goes to Dr. EvaAmalia Haileyr foot exams   We discussed: how to recognize and treat signs of hypoglycemia.   Plan Continue current medications   Heart Failure  Type: Systolic  Last ejection fraction: 25 to 30% (07/21/2019)  NYHA Class: II (slight limitation of activity) AHA HF Stage: B (Heart disease present - no symptoms present)  Patient has failed these meds in past: spironolactone (gynecomastia)  Patient is currently controlled on the following medications:   Metoprolol 55m81m tablet once daily  Metoprolol 50mg7mtablet once daily   Bidil 20-37.5mg, 51mablet twice times daily  Entresto 97/103mg, 49mblet twice daily  We discussed weighing daily; if you gain more than 3 pounds in one day or 5 pounds in one week call your doctor  Plan Continue current medications  Hypertension  Denies dizziness, lightheadeness, orthostatic hypotension  BP today is:  <140/90  Office blood pressures are  BP Readings from Last 3 Encounters:  08/27/19 136/82  08/15/19 124/76  08/13/19 130/64    Patient checks BP at home 3-5x per week  Patient home BP readings are ranging: 120-140/ 80-85  Patient is controlled on:   Metoprolol 55mg, 17m  tablet once daily  Metoprolol 67m, 1 tablet once daily   We discussed diet and exercise extensively  . Discussed diet modifications. DASH diet:  following a diet emphasizing fruits and vegetables and low-fat dairy products along with whole grains, fish, poultry, and nuts. Reducing red meats and sugars.  . Exercising  Plan Requested RX for blood pressure monitoring  Continue current medications   Hyperlipidemia/ CAD    Lipid Panel     Component Value Date/Time   CHOL 115 08/01/2019 1125   CHOL 137 08/05/2015 1108   TRIG 134.0 08/01/2019 1125   HDL 27.30 (L) 08/01/2019 1125   HDL 32 (L) 08/05/2015 1108   CHOLHDL 4 08/01/2019 1125   VLDL 26.8 08/01/2019 1125   LDLCALC 61 08/01/2019 1125   LDLCALC 86 08/05/2015 1108   LABVLDL 19 08/05/2015 1108     The ASCVD Risk score (Goff DC Jr., et al., 2013) failed to calculate for the following reasons:   The patient has a prior MI or stroke diagnosis   Patient is currently controlled on the following medications:   Rosuvastatin 485m 1 tablet once daily  Fenofibrate 16071m1 tablet every evening   We discussed:  diet and exercise extensively.   - diet: vegetables, oatmeal/ grits - exercise:  starting to get active now in summertime  Plan Continue current medications  Ventricular tachycardia   Patient has failed these meds in past:  Patient is currently controlled on the following medications:   Sotalol 36m85m tablet once daily  Ranolazine (Ranexa) 500mg81mtablet once daily (decreased to once daily due to side effects)   Plan Continue current medications.   Seizure disorder  Last episode: last year   Patient has failed these meds in past:  Patient is currently controlled on the following medications:   Vimpat 100mg,60mablet twice daily   Plan Continue current  medications  BPH  Patient is currently controlled on the following medications:   Finasteride 5mg, 161mblet once daily  tamsulosin 0.4mg, 1 27msule once daily  mirabegron ER 15mg, 1 18met once daily  Plan Managed by Martin WeEdrick Ohogist)  Continue current medications  Memory loss  Patient is currently managed on the following medications:   memantine 10mg, 1 t11mt twice daily   Plan Managed by Dr. Karen AquiEllouise Newerist).  Continue current medications  CKD, stage III   Lab Results  Component Value Date   CREATININE 2.13 (H) 08/13/2019   CREATININE 1.89 (H) 08/01/2019   CREATININE 1.66 (H) 07/09/2019   CrCl cannot be calculated (Patient's most recent lab result is older than the maximum 21 days allowed.).   Plan Kidney function declined. Continue to monitor and adjust medications as needed.  Managed by nephrologist.   Medication Management  Patient organizes medications: in a pill box  Primary pharmacy: [facility] Adherence:  - fenofibrate 160mg (12/274m20--> 09/08/2019 for 90DS) - Bidil 20-37.5 (05/08/2019--> 08/26/2019 for 90DS) - Lantus Solostar (04/25/2019, 125DS)   Follow up Follow up visit with PharmD in 6 months. Will conduct general telephone calls for periodic check-ins before next visit.   Manjinder Breau De Anson Croftsinical Pharmacist [facility] PriKatherine[facility] at [facility] [id number] [phone]

## 2019-09-19 ENCOUNTER — Other Ambulatory Visit: Payer: Self-pay | Admitting: Adult Health

## 2019-09-19 MED ORDER — ALBUTEROL SULFATE HFA 108 (90 BASE) MCG/ACT IN AERS: 1.0000 | INHALATION_SPRAY | RESPIRATORY_TRACT | 1 refills | Status: DC | PRN

## 2019-09-19 MED ORDER — BLOOD PRESSURE MONITOR/M CUFF MISC: 0 refills | Status: DC

## 2019-09-19 NOTE — Patient Instructions (Addendum)
Visit Information  Goals Addressed            This Visit's Progress   . Pharmacy Care Plan       CARE PLAN ENTRY  Current Barriers:  . Chronic Disease Management support, education, and care coordination needs related to Hypertension, Diabetes, Atrial Fibrillation, Heart Failure, Coronary Artery Disease, COPD, and seizure, and BPH   Hypertension . Pharmacist Clinical Goal(s): o Over the next 180 days, patient will work with PharmD and providers to maintain BP goal < 140/90 . Current regimen:   Metoprolol 68m, 1 tablet once daily  Metoprolol 516m 1 tablet once daily . Interventions: o Discussed diet and exercise and affect on blood pressure . Patient self care activities - Over the next 180 days, patient will: o Check BP 3 to 5 times per week, document, and provide at future appointments o Ensure daily salt intake < 2300 mg/day  Hyperlipidemia/ coronary artery disease . Pharmacist Clinical Goal(s): o Over the next 180 days, patient will work with PharmD and providers to maintain LDL goal < 70 . Current regimen:   Rosuvastatin 4079m1 tablet once daily  Fenofibrate 160m56m tablet every evening  . Interventions: . We discussed how a diet high in plant sterols (fruits/vegetables/nuts/whole grains/legumes) may reduce your cholesterol.  Encouraged increasing fiber to a daily intake of 10-25g/day  . Patient self care activities - Over the next 180 days, patient will: o Continue to work on lifestyle modifications (exercise/ diet).   Heart Failure . Pharmacist Clinical Goal(s) o Over the next 180 days, patient will continue to monitor weight.  . Current regimen:   Metoprolol 25mg82mtablet once daily  Metoprolol 50mg,28mablet once daily   Bidil 20-37.5mg, 242mblet twice times daily  Entresto 97/103mg, 132mlet twice daily . Interventions: o We discussed weighing daily; if you gain more than 3 pounds in one day or 5 pounds in one week call your doctor . Patient self  care activities o Patient to continue current medications and daily weights.    Diabetes . Pharmacist Clinical Goal(s): o Over the next 180 days, patient will work with PharmD and providers to maintain A1c goal < 7.0 . Current regimen:   empagliflozin (Jardiance) 10mg, 1 67met once daily before breakfast  Insulin glargine (Lantus Solostar), use 5 units at bedtime  . Interventions: o We discussed: how to recognize and treat signs of hypoglycemia. . Patient self care activities - Over the next 180 days, patient will: o Check blood sugar daily, document, and provide at future appointments o Contact provider with any episodes of hypoglycemia  Atrial fibrillation . Current regimen:  o Sotalol 80mg, 1 t9mt once daily o Eliquis 5mg, 1 tab46m twice daily  . Interventions: o We discussed:  monitoring for signs and symptoms for bleeding (coughing up blood, prolonged nose bleeds, black, tarry stools). . Patient self care activities o Patient will continue current medications.   COPD . Pharmacist Clinical Goal(s) o Over the next 180 days, Prevent worsening of shortness of breath and hospitalizations. . Current regimen:   Albuterol HFA 108mcg/act i10mer, 1 to 2 puffs every four hours as needed for wheezing or shortness of breath  Spiriva Respimat 2.5mcg/ act in30mer, 2 puffs once daily . Interventions: o [facility] o Patient will continue current medications.   Ventricular tachycardia . Current regimen:   Sotalol 80mg, 1 table20mce daily  Ranolazine (Ranexa) 500mg, 1 tablet46m  once daily . Patient self care activities  o Patient will continue current medications.  Seizure disorder . Current regimen:  o Vimpat 178m, 1 tablet twice daily . Patient self care activities o Patient will continue current medications.  BPH . Current regimen:   Finasteride 597m 1 tablet once daily  tamsulosin 0.107m57m1  capsule once daily  mirabegron ER 54m26m tablet once daily . Patient self care activities o Patient will continue current medications and follow up visits with Dr. MartEdrick Ohmory Loss . Current regimen:  o memantine 10mg7107mtablet twice daily  . Patient self care activities o Patient will continue current medications and follow up visits with Dr. KarenEllouise NewerMedication management . Pharmacist Clinical Goal(s): o Over the next 180 days, patient will work with PharmD and providers to maintain optimal medication adherence . Current pharmacy: [facility] . Interventions o Comprehensive medication review performed. o Continue current medication management strategy . Patient self care activities  o Take medications as prescribed o Report any questions or concerns to PharmD and/or provider(s)  Initial goal documentation        Mr. JohnsTallogiven information about Chronic Care Management services today including:  1. CCM service includes personalized support from designated clinical staff supervised by his physician, including individualized plan of care and coordination with other care providers 2. 24/7 contact phone numbers for assistance for urgent and routine care needs. 3. Standard insurance, coinsurance, copays and deductibles apply for chronic care management only during months in which we provide at least 20 minutes of these services. Most insurances cover these services at 100%, however patients may be responsible for any copay, coinsurance and/or deductible if applicable. This service may help you avoid the need for more expensive face-to-face services. 4. Only one practitioner may furnish and bill the service in a calendar month. 5. The patient may stop CCM services at any time (effective at the end of the month) by phone call to the office staff.  Patient agreed to services and verbal consent obtained.   The patient verbalized understanding of instructions  provided today and agreed to receive a mailed copy of patient instruction and/or educational materials.  Telephone follow up appointment with pharmacy team member scheduled for: 03/19/2020  AnnetAnson CroftsrmD Clinical Pharmacist [facility])[phone] Failure and Exercise Heart failure is a condition in which the heart does not fill or pump enough blood and oxygen to support your body and its functions. Heart failure is a long-term (chronic) condition. Living with heart failure can be challenging. However, following your health care provider's instructions about a healthy lifestyle may help improve your symptoms. This includes choosing the right exercise plan. Doing daily physical activity is important after a diagnosis of heart failure. You may have some activity restrictions, so talk to your health care provider before doing any exercises. What are the benefits of exercise? Exercise may:  Make your heart muscles stronger.  Lower your blood pressure.  Lower your cholesterol.  Help you lose weight.  Help your bones stay strong.  Improve your blood circulation.  Help your body use oxygen better. This relieves symptoms such as fatigue and shortness of breath.  Help your mental health by lowering the risk of depression and other problems.  Improve your quality of life.  Decrease your chance of hospital admission for heart failure. What is an exercise plan? An exercise plan is a set  of specific exercises and training activities. You will work with your health care provider to create the exercise plan that works for you. The plan may include:  Different types of exercises and how to do them.  Cardiac rehabilitation exercises. These are supervised programs that are designed to strengthen your heart. What are strengthening exercises? Strengthening exercises are a type of physical activity that involves using resistance to improve your  muscle strength. Strengthening exercises usually have repetitive motions. These types of exercises can include:  Lifting weights.  Using weight machines.  Using resistance tubes and bands.  Using kettlebells.  Using your body weight, such as doing push-ups or squats. What are balance exercises? Balance exercises are another type of physical activity. They strengthen the muscles of the back, stomach, and pelvis (core muscles) and improve your balance. They can also lower your risk of falling. These types of exercises can include:  Standing on one leg.  Walking backward, sideways, and in a straight line.  Standing up after sitting, without using your hands.  Shifting your weight from one leg to the other.  Lifting one leg in front of you.  Doing tai chi. This is a type of exercise that uses slow movements and deep breathing. How can I increase my flexibility? Having better flexibility can keep you from falling. It can also lengthen your muscles, improve your range of motion, and help your joints. You can increase your flexibility by:  Doing tai chi.  Doing yoga.  Stretching. How much aerobic exercise should I get?  Aerobic exercises strengthen your breathing and circulation system and increase your body's use of oxygen. Examples of aerobic exercise include biking, walking, running, and swimming. Talk to your health care provider to find out how much aerobic exercise is safe for you.  To do these exercises:  Start exercising slowly, limiting the amount of time at first. You may need to start with 5 minutes of aerobic exercise every day.  Slowly add more minutes until you can safely do at least 30 minutes of exercise at least 4 days a week. Summary  Daily physical activity is important after a diagnosis of heart failure.  Exercise can make your heart muscles stronger. It also offers other benefits that will improve your health.  Talk to your health care provider before  doing any exercises. This information is not intended to replace advice given to you by your health care provider. Make sure you discuss any questions you have with your health care provider. Document Revised: 09/22/2016 Document Reviewed: 09/19/2016 Elsevier Patient Education  [place].

## 2019-10-03 ENCOUNTER — Other Ambulatory Visit: Payer: Self-pay | Admitting: Physician Assistant

## 2019-10-03 ENCOUNTER — Other Ambulatory Visit: Payer: Self-pay | Admitting: Neurology

## 2019-10-08 ENCOUNTER — Ambulatory Visit (INDEPENDENT_AMBULATORY_CARE_PROVIDER_SITE_OTHER): Payer: Medicare HMO | Admitting: *Deleted

## 2019-10-08 DIAGNOSIS — I472 Ventricular tachycardia, unspecified: Secondary | ICD-10-CM

## 2019-10-08 DIAGNOSIS — I255 Ischemic cardiomyopathy: Secondary | ICD-10-CM

## 2019-10-08 LAB — CUP PACEART REMOTE DEVICE CHECK
Battery Remaining Longevity: 12 mo
Battery Remaining Percentage: 18 %
Brady Statistic RA Percent Paced: 24 %
Brady Statistic RV Percent Paced: 0 %
Date Time Interrogation Session: 20210519042100
HighPow Impedance: 55 Ohm
Implantable Lead Implant Date: 20050922
Implantable Lead Implant Date: 20050922
Implantable Lead Location: 753859
Implantable Lead Location: 753860
Implantable Lead Model: 158
Implantable Lead Model: 5076
Implantable Lead Serial Number: 156891
Implantable Pulse Generator Implant Date: 20100817
Lead Channel Impedance Value: 457 Ohm
Lead Channel Impedance Value: 517 Ohm
Lead Channel Pacing Threshold Amplitude: 0.6 V
Lead Channel Pacing Threshold Amplitude: 1.2 V
Lead Channel Pacing Threshold Pulse Width: 0.4 ms
Lead Channel Pacing Threshold Pulse Width: 0.4 ms
Lead Channel Setting Pacing Amplitude: 2 V
Lead Channel Setting Pacing Amplitude: 2.4 V
Lead Channel Setting Pacing Pulse Width: 0.4 ms
Lead Channel Setting Sensing Sensitivity: 0.6 mV
Pulse Gen Serial Number: 141895

## 2019-10-10 NOTE — Progress Notes (Signed)
Remote ICD transmission.   

## 2019-10-27 ENCOUNTER — Other Ambulatory Visit (HOSPITAL_COMMUNITY): Payer: Self-pay | Admitting: Internal Medicine

## 2019-10-28 ENCOUNTER — Other Ambulatory Visit: Payer: Self-pay | Admitting: Adult Health

## 2019-10-28 ENCOUNTER — Other Ambulatory Visit: Payer: Self-pay

## 2019-10-29 ENCOUNTER — Encounter: Payer: Self-pay | Admitting: Adult Health

## 2019-10-29 ENCOUNTER — Ambulatory Visit (INDEPENDENT_AMBULATORY_CARE_PROVIDER_SITE_OTHER): Payer: Medicare HMO | Admitting: Adult Health

## 2019-10-29 VITALS — BP 130/80 | Temp 98.0°F | Wt 185.0 lb

## 2019-10-29 DIAGNOSIS — Z794 Long term (current) use of insulin: Secondary | ICD-10-CM | POA: Diagnosis not present

## 2019-10-29 DIAGNOSIS — E118 Type 2 diabetes mellitus with unspecified complications: Secondary | ICD-10-CM | POA: Diagnosis not present

## 2019-10-29 LAB — POCT GLYCOSYLATED HEMOGLOBIN (HGB A1C): HbA1c, POC (controlled diabetic range): 6.2 % (ref 0.0–7.0)

## 2019-10-29 MED ORDER — ONETOUCH ULTRASOFT LANCETS MISC: 3 refills | Status: DC

## 2019-10-29 MED ORDER — EMPAGLIFLOZIN 10 MG PO TABS: 10.0000 mg | ORAL_TABLET | Freq: Every day | ORAL | 0 refills | Status: DC

## 2019-10-29 NOTE — Patient Instructions (Signed)
Your A1c has improved to 6.2   I am going to try taking you off Lantus completely.   Please follow up in 3 months for a repeat A1c

## 2019-10-29 NOTE — Telephone Encounter (Signed)
SENT TO THE PHARMACY BY E-SCRIBE. 
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
## 2019-11-22 ENCOUNTER — Other Ambulatory Visit (HOSPITAL_COMMUNITY): Payer: Self-pay | Admitting: Internal Medicine

## 2019-11-22 DIAGNOSIS — I482 Chronic atrial fibrillation, unspecified: Secondary | ICD-10-CM

## 2019-11-26 ENCOUNTER — Other Ambulatory Visit: Payer: Self-pay

## 2019-11-26 ENCOUNTER — Ambulatory Visit (INDEPENDENT_AMBULATORY_CARE_PROVIDER_SITE_OTHER): Payer: Medicare HMO | Admitting: Podiatry

## 2019-11-26 ENCOUNTER — Encounter: Payer: Self-pay | Admitting: Podiatry

## 2019-11-26 DIAGNOSIS — E0843 Diabetes mellitus due to underlying condition with diabetic autonomic (poly)neuropathy: Secondary | ICD-10-CM

## 2019-11-26 DIAGNOSIS — M79675 Pain in left toe(s): Secondary | ICD-10-CM | POA: Diagnosis not present

## 2019-11-26 DIAGNOSIS — L989 Disorder of the skin and subcutaneous tissue, unspecified: Secondary | ICD-10-CM

## 2019-11-26 DIAGNOSIS — B351 Tinea unguium: Secondary | ICD-10-CM | POA: Diagnosis not present

## 2019-11-26 DIAGNOSIS — M79674 Pain in right toe(s): Secondary | ICD-10-CM | POA: Diagnosis not present
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
## 2019-12-10 ENCOUNTER — Other Ambulatory Visit: Payer: Self-pay | Admitting: Internal Medicine

## 2019-12-18 DIAGNOSIS — G4733 Obstructive sleep apnea (adult) (pediatric): Secondary | ICD-10-CM | POA: Diagnosis not present

## 2020-01-07 ENCOUNTER — Ambulatory Visit (INDEPENDENT_AMBULATORY_CARE_PROVIDER_SITE_OTHER): Payer: Medicare HMO | Admitting: *Deleted

## 2020-01-07 DIAGNOSIS — I472 Ventricular tachycardia, unspecified: Secondary | ICD-10-CM

## 2020-01-08 LAB — CUP PACEART REMOTE DEVICE CHECK
Battery Remaining Longevity: 12 mo
Battery Remaining Percentage: 15 %
Brady Statistic RA Percent Paced: 26 %
Brady Statistic RV Percent Paced: 0 %
Date Time Interrogation Session: 20210818042200
HighPow Impedance: 51 Ohm
Implantable Lead Implant Date: 20050922
Implantable Lead Implant Date: 20050922
Implantable Lead Location: 753859
Implantable Lead Location: 753860
Implantable Lead Model: 158
Implantable Lead Model: 5076
Implantable Lead Serial Number: 156891
Implantable Pulse Generator Implant Date: 20100817
Lead Channel Impedance Value: 449 Ohm
Lead Channel Impedance Value: 487 Ohm
Lead Channel Pacing Threshold Amplitude: 0.6 V
Lead Channel Pacing Threshold Amplitude: 1.2 V
Lead Channel Pacing Threshold Pulse Width: 0.4 ms
Lead Channel Pacing Threshold Pulse Width: 0.4 ms
Lead Channel Setting Pacing Amplitude: 2 V
Lead Channel Setting Pacing Amplitude: 2.4 V
Lead Channel Setting Pacing Pulse Width: 0.4 ms
Lead Channel Setting Sensing Sensitivity: 0.6 mV
Pulse Gen Serial Number: 141895

## 2020-01-09 NOTE — Progress Notes (Signed)
Remote ICD transmission.   

## 2020-01-12 ENCOUNTER — Other Ambulatory Visit: Payer: Self-pay | Admitting: Neurology

## 2020-01-12 ENCOUNTER — Encounter: Payer: Self-pay | Admitting: Neurology

## 2020-01-12 ENCOUNTER — Other Ambulatory Visit (HOSPITAL_COMMUNITY): Payer: Self-pay | Admitting: Internal Medicine

## 2020-01-12 NOTE — Telephone Encounter (Signed)
I have not seen him since May 2020, he will need to schedule a follow-up and we can send refills until that follow-up. Pls let me know f/u date and I will send refills til then. Thanks

## 2020-01-19 ENCOUNTER — Other Ambulatory Visit (HOSPITAL_COMMUNITY): Payer: Self-pay | Admitting: Internal Medicine

## 2020-01-27 ENCOUNTER — Other Ambulatory Visit: Payer: Self-pay

## 2020-01-27 ENCOUNTER — Ambulatory Visit (INDEPENDENT_AMBULATORY_CARE_PROVIDER_SITE_OTHER): Payer: Medicare HMO | Admitting: Adult Health

## 2020-01-27 ENCOUNTER — Encounter: Payer: Self-pay | Admitting: Adult Health

## 2020-01-27 VITALS — BP 132/82 | HR 55 | Temp 98.1°F | Wt 187.0 lb

## 2020-01-27 DIAGNOSIS — Z794 Long term (current) use of insulin: Secondary | ICD-10-CM | POA: Diagnosis not present

## 2020-01-27 DIAGNOSIS — E118 Type 2 diabetes mellitus with unspecified complications: Secondary | ICD-10-CM

## 2020-01-27 DIAGNOSIS — Z23 Encounter for immunization: Secondary | ICD-10-CM | POA: Diagnosis not present

## 2020-01-27 LAB — POCT GLYCOSYLATED HEMOGLOBIN (HGB A1C): Hemoglobin A1C: 6.6 % — AB (ref 4.0–5.6)

## 2020-01-27 MED ORDER — EMPAGLIFLOZIN 10 MG PO TABS: 10.0000 mg | ORAL_TABLET | Freq: Every day | ORAL | 0 refills | Status: DC

## 2020-01-27 NOTE — Patient Instructions (Addendum)
It was great seeing you today   Your A1c was 6.6 - this is up slightly from 6.2   Please talk to your kidney doctor about increasing myrbetriq dose. Or you can try taking the medication in the morning   I will see you back in 3 months
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
## 2020-02-06 ENCOUNTER — Encounter: Payer: Self-pay | Admitting: Adult Health

## 2020-02-06 ENCOUNTER — Other Ambulatory Visit: Payer: Self-pay | Admitting: Adult Health

## 2020-02-06 MED ORDER — BLOOD PRESSURE MONITOR/M CUFF MISC: 0 refills | Status: AC

## 2020-02-18 ENCOUNTER — Other Ambulatory Visit: Payer: Self-pay

## 2020-02-18 ENCOUNTER — Ambulatory Visit (INDEPENDENT_AMBULATORY_CARE_PROVIDER_SITE_OTHER): Payer: Medicare HMO

## 2020-02-18 DIAGNOSIS — Z0000 Encounter for general adult medical examination without abnormal findings: Secondary | ICD-10-CM | POA: Diagnosis not present

## 2020-02-18 DIAGNOSIS — G4733 Obstructive sleep apnea (adult) (pediatric): Secondary | ICD-10-CM | POA: Diagnosis not present

## 2020-02-18 DIAGNOSIS — Z598 Other problems related to housing and economic circumstances: Secondary | ICD-10-CM

## 2020-02-18 DIAGNOSIS — Z599 Problem related to housing and economic circumstances, unspecified: Secondary | ICD-10-CM

## 2020-02-18 NOTE — Patient Instructions (Signed)
[name] , Thank you for taking time to come for your Medicare Wellness Visit. I appreciate your ongoing commitment to your health goals. Please review the following plan we discussed and let me know if I can assist you in the future.   Screening recommendations/referrals: Colonoscopy: Up to date, next due 09/22/2020 Recommended yearly ophthalmology/optometry visit for glaucoma screening and checkup Recommended yearly dental visit for hygiene and checkup  Vaccinations: Influenza vaccine: Currently due for flu vaccine, you may schedule and appointment with our office to receive or you may receive at your local pharmacy. Pneumococcal vaccine: Completed series Tdap vaccine: Currently due for TDAP, you may contact your insurance to discuss cost or await and injury to received as insurance sometimes does not cover unless and injury. Shingles vaccine: Currently due for Shingrix, you may contact your pharmacy to discuss cost and to receive the vaccine as we do not have this vaccine in office.    Advanced directives: Copies on file   Conditions/risks identified: I will place a referral to try to help find you assistance with paying for food.   Next appointment: None   Preventive Care 65 Years and Older, Male Preventive care refers to lifestyle choices and visits with your health care provider that can promote health and wellness. What does preventive care include?  A yearly physical exam. This is also called an annual well check.  Dental exams once or twice a year.  Routine eye exams. Ask your health care provider how often you should have your eyes checked.  Personal lifestyle choices, including:  Daily care of your teeth and gums.  Regular physical activity.  Eating a healthy diet.  Avoiding tobacco and drug use.  Limiting alcohol use.  Practicing safe sex.  Taking low doses of aspirin every day.  Taking vitamin and mineral supplements as recommended by your health care  provider. What happens during an annual well check? The services and screenings done by your health care provider during your annual well check will depend on your age, overall health, lifestyle risk factors, and family history of disease. Counseling  Your health care provider may ask you questions about your:  Alcohol use.  Tobacco use.  Drug use.  Emotional well-being.  Home and relationship well-being.  Sexual activity.  Eating habits.  History of falls.  Memory and ability to understand (cognition).  Work and work Statistician. Screening  You may have the following tests or measurements:  Height, weight, and BMI.  Blood pressure.  Lipid and cholesterol levels. These may be checked every 5 years, or more frequently if you are over 71 years old.  Skin check.  Lung cancer screening. You may have this screening every year starting at age 29 if you have a 30-pack-year history of smoking and currently smoke or have quit within the past 15 years.  Fecal occult blood test (FOBT) of the stool. You may have this test every year starting at age 44.  Flexible sigmoidoscopy or colonoscopy. You may have a sigmoidoscopy every 5 years or a colonoscopy every 10 years starting at age 59.  Prostate cancer screening. Recommendations will vary depending on your family history and other risks.  Hepatitis C blood test.  Hepatitis B blood test.  Sexually transmitted disease (STD) testing.  Diabetes screening. This is done by checking your blood sugar (glucose) after you have not eaten for a while (fasting). You may have this done every 1-3 years.  Abdominal aortic aneurysm (AAA) screening. You may need this if  you are a current or former smoker.  Osteoporosis. You may be screened starting at age 18 if you are at high risk. Talk with your health care provider about your test results, treatment options, and if necessary, the need for more tests. Vaccines  Your health care provider  may recommend certain vaccines, such as:  Influenza vaccine. This is recommended every year.  Tetanus, diphtheria, and acellular pertussis (Tdap, Td) vaccine. You may need a Td booster every 10 years.  Zoster vaccine. You may need this after age 36.  Pneumococcal 13-valent conjugate (PCV13) vaccine. One dose is recommended after age 68.  Pneumococcal polysaccharide (PPSV23) vaccine. One dose is recommended after age 31. Talk to your health care provider about which screenings and vaccines you need and how often you need them. This information is not intended to replace advice given to you by your health care provider. Make sure you discuss any questions you have with your health care provider. Document Released: 06/04/2015 Document Revised: 01/26/2016 Document Reviewed: 03/09/2015 Elsevier Interactive Patient Education  2017 [place] Prevention in the Home Falls can cause injuries. They can happen to people of all ages. There are many things you can do to make your home safe and to help prevent falls. What can I do on the outside of my home?  Regularly fix the edges of walkways and driveways and fix any cracks.  Remove anything that might make you trip as you walk through a door, such as a raised step or threshold.  Trim any bushes or trees on the path to your home.  Use bright outdoor lighting.  Clear any walking paths of anything that might make someone trip, such as rocks or tools.  Regularly check to see if handrails are loose or broken. Make sure that both sides of any steps have handrails.  Any raised decks and porches should have guardrails on the edges.  Have any leaves, snow, or ice cleared regularly.  Use sand or salt on walking paths during winter.  Clean up any spills in your garage right away. This includes oil or grease spills. What can I do in the bathroom?  Use night lights.  Install grab bars by the toilet and in the tub and shower. Do not use  towel bars as grab bars.  Use non-skid mats or decals in the tub or shower.  If you need to sit down in the shower, use a plastic, non-slip stool.  Keep the floor dry. Clean up any water that spills on the floor as soon as it happens.  Remove soap buildup in the tub or shower regularly.  Attach bath mats securely with double-sided non-slip rug tape.  Do not have throw rugs and other things on the floor that can make you trip. What can I do in the bedroom?  Use night lights.  Make sure that you have a light by your bed that is easy to reach.  Do not use any sheets or blankets that are too big for your bed. They should not hang down onto the floor.  Have a firm chair that has side arms. You can use this for support while you get dressed.  Do not have throw rugs and other things on the floor that can make you trip. What can I do in the kitchen?  Clean up any spills right away.  Avoid walking on wet floors.  Keep items that you use a lot in easy-to-reach places.  If you need to  reach something above you, use a strong step stool that has a grab bar.  Keep electrical cords out of the way.  Do not use floor polish or wax that makes floors slippery. If you must use wax, use non-skid floor wax.  Do not have throw rugs and other things on the floor that can make you trip. What can I do with my stairs?  Do not leave any items on the stairs.  Make sure that there are handrails on both sides of the stairs and use them. Fix handrails that are broken or loose. Make sure that handrails are as long as the stairways.  Check any carpeting to make sure that it is firmly attached to the stairs. Fix any carpet that is loose or worn.  Avoid having throw rugs at the top or bottom of the stairs. If you do have throw rugs, attach them to the floor with carpet tape.  Make sure that you have a light switch at the top of the stairs and the bottom of the stairs. If you do not have them, ask  someone to add them for you. What else can I do to help prevent falls?  Wear shoes that:  Do not have high heels.  Have rubber bottoms.  Are comfortable and fit you well.  Are closed at the toe. Do not wear sandals.  If you use a stepladder:  Make sure that it is fully opened. Do not climb a closed stepladder.  Make sure that both sides of the stepladder are locked into place.  Ask someone to hold it for you, if possible.  Clearly mark and make sure that you can see:  Any grab bars or handrails.  First and last steps.  Where the edge of each step is.  Use tools that help you move around (mobility aids) if they are needed. These include:  Canes.  Walkers.  Scooters.  Crutches.  Turn on the lights when you go into a dark area. Replace any light bulbs as soon as they burn out.  Set up your furniture so you have a clear path. Avoid moving your furniture around.  If any of your floors are uneven, fix them.  If there are any pets around you, be aware of where they are.  Review your medicines with your doctor. Some medicines can make you feel dizzy. This can increase your chance of falling. Ask your doctor what other things that you can do to help prevent falls. This information is not intended to replace advice given to you by your health care provider. Make sure you discuss any questions you have with your health care provider. Document Released: 03/04/2009 Document Revised: 10/14/2015 Document Reviewed: 06/12/2014 Elsevier Interactive Patient Education  2017 [employer].
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
## 2020-02-20 ENCOUNTER — Telehealth: Payer: Self-pay | Admitting: Adult Health

## 2020-02-20 ENCOUNTER — Other Ambulatory Visit (HOSPITAL_COMMUNITY): Payer: Self-pay | Admitting: Internal Medicine

## 2020-02-20 DIAGNOSIS — N189 Chronic kidney disease, unspecified: Secondary | ICD-10-CM | POA: Diagnosis not present

## 2020-02-20 DIAGNOSIS — D631 Anemia in chronic kidney disease: Secondary | ICD-10-CM | POA: Diagnosis not present

## 2020-02-20 DIAGNOSIS — N2581 Secondary hyperparathyroidism of renal origin: Secondary | ICD-10-CM | POA: Diagnosis not present

## 2020-02-20 DIAGNOSIS — N183 Chronic kidney disease, stage 3 unspecified: Secondary | ICD-10-CM | POA: Diagnosis not present

## 2020-02-20 DIAGNOSIS — I129 Hypertensive chronic kidney disease with stage 1 through stage 4 chronic kidney disease, or unspecified chronic kidney disease: Secondary | ICD-10-CM | POA: Diagnosis not present

## 2020-02-20 DIAGNOSIS — I255 Ischemic cardiomyopathy: Secondary | ICD-10-CM | POA: Diagnosis not present

## 2020-02-20 DIAGNOSIS — I4891 Unspecified atrial fibrillation: Secondary | ICD-10-CM | POA: Diagnosis not present

## 2020-02-20 NOTE — Telephone Encounter (Signed)
[name] 02/20/2020 Called pt regarding community resource referral received. Left message for pt to call me back, my info is [phone] please see ref notes for more details.  [place], Care Management

## 2020-02-24 ENCOUNTER — Other Ambulatory Visit: Payer: Self-pay | Admitting: Adult Health

## 2020-02-24 DIAGNOSIS — F339 Major depressive disorder, recurrent, unspecified: Secondary | ICD-10-CM

## 2020-02-27 ENCOUNTER — Encounter: Payer: Self-pay | Admitting: Internal Medicine

## 2020-02-27 ENCOUNTER — Ambulatory Visit (INDEPENDENT_AMBULATORY_CARE_PROVIDER_SITE_OTHER): Payer: Medicare HMO | Admitting: Internal Medicine

## 2020-02-27 ENCOUNTER — Other Ambulatory Visit: Payer: Self-pay

## 2020-02-27 VITALS — BP 124/76 | HR 60 | Ht 70.0 in | Wt 184.4 lb

## 2020-02-27 DIAGNOSIS — I472 Ventricular tachycardia, unspecified: Secondary | ICD-10-CM

## 2020-02-27 DIAGNOSIS — I255 Ischemic cardiomyopathy: Secondary | ICD-10-CM | POA: Diagnosis not present

## 2020-02-27 DIAGNOSIS — I480 Paroxysmal atrial fibrillation: Secondary | ICD-10-CM | POA: Diagnosis not present

## 2020-02-27 LAB — CUP PACEART INCLINIC DEVICE CHECK
Date Time Interrogation Session: 20211008164331
HighPow Impedance: 43 Ohm
HighPow Impedance: 55 Ohm
Implantable Lead Implant Date: 20050922
Implantable Lead Implant Date: 20050922
Implantable Lead Location: 753859
Implantable Lead Location: 753860
Implantable Lead Model: 158
Implantable Lead Model: 5076
Implantable Lead Serial Number: 156891
Implantable Pulse Generator Implant Date: 20100817
Lead Channel Impedance Value: 453 Ohm
Lead Channel Impedance Value: 487 Ohm
Lead Channel Pacing Threshold Amplitude: 0.6 V
Lead Channel Pacing Threshold Amplitude: 1.1 V
Lead Channel Pacing Threshold Pulse Width: 0.4 ms
Lead Channel Pacing Threshold Pulse Width: 0.4 ms
Lead Channel Sensing Intrinsic Amplitude: 21.6 mV
Lead Channel Sensing Intrinsic Amplitude: 3.1 mV
Lead Channel Setting Pacing Amplitude: 2 V
Lead Channel Setting Pacing Amplitude: 2.4 V
Lead Channel Setting Pacing Pulse Width: 0.4 ms
Lead Channel Setting Sensing Sensitivity: 0.6 mV
Pulse Gen Serial Number: 141895

## 2020-02-27 NOTE — Patient Instructions (Addendum)
Medication Instructions:  Stop Ranexa  *If you need a refill on your cardiac medications before your next appointment, please call your pharmacy*  Lab Work: BMET Mag  If you have labs (blood work) drawn today and your tests are completely normal, you will receive your results only by: Marland Kitchen MyChart Message (if you have MyChart) OR . A paper copy in the mail If you have any lab test that is abnormal or we need to change your treatment, we will call you to review the results.  Testing/Procedures: None ordered.  Follow-Up: At [facility], you and your health needs are our priority.  As part of our continuing mission to provide you with exceptional heart care, we have created designated Provider Care Teams.  These Care Teams include your primary Cardiologist (physician) and Advanced Practice Providers (APPs -  Physician Assistants and Nurse Practitioners) who all work together to provide you with the care you need, when you need it.  We recommend signing up for the patient portal called "MyChart".  Sign up information is provided on this After Visit Summary.  MyChart is used to connect with patients for Virtual Visits (Telemedicine).  Patients are able to view lab/test results, encounter notes, upcoming appointments, etc.  Non-urgent messages can be sent to your provider as well.   To learn more about what you can do with MyChart, go to [email or web address].    Your next appointment:   Your physician wants you to follow-up in: 6 months with Dr. Rayann Heman.  You will receive a reminder letter in the mail two months in advance. If you don't receive a letter, please call our office to schedule the follow-up appointment.  Remote monitoring is used to monitor your ICD from home. This monitoring reduces the number of office visits required to check your device to one time per year. It allows [place] to keep an eye on the functioning of your device to ensure it is working properly. You are scheduled for a  device check from home on 03/30/2020. You may send your transmission at any time that day. If you have a wireless device, the transmission will be sent automatically. After your physician reviews your transmission, you will receive a postcard with your next transmission date.  Other Instructions:
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
## 2020-02-28 LAB — BASIC METABOLIC PANEL
BUN/Creatinine Ratio: 12 (ref 10–24)
BUN: 20 mg/dL (ref 8–27)
CO2: 25 mmol/L (ref 20–29)
Calcium: 10.1 mg/dL (ref 8.6–10.2)
Chloride: 106 mmol/L (ref 96–106)
Creatinine, Ser: 1.64 mg/dL — ABNORMAL HIGH (ref 0.76–1.27)
GFR calc Af Amer: 47 mL/min/{1.73_m2} — ABNORMAL LOW (ref >59)
GFR calc non Af Amer: 41 mL/min/{1.73_m2} — ABNORMAL LOW (ref >59)
Glucose: 75 mg/dL (ref 65–99)
Potassium: 4.6 mmol/L (ref 3.5–5.2)
Sodium: 143 mmol/L (ref 134–144)

## 2020-02-28 LAB — MAGNESIUM: Magnesium: 2 mg/dL (ref 1.6–2.3)

## 2020-03-01 ENCOUNTER — Ambulatory Visit: Payer: Medicare HMO | Admitting: Podiatry
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
## 2020-03-15 ENCOUNTER — Encounter (HOSPITAL_COMMUNITY): Payer: Self-pay | Admitting: Internal Medicine

## 2020-03-15 ENCOUNTER — Other Ambulatory Visit: Payer: Self-pay

## 2020-03-15 ENCOUNTER — Ambulatory Visit (HOSPITAL_COMMUNITY)
Admission: RE | Admit: 2020-03-15 | Discharge: 2020-03-15 | Disposition: A | Discharge status: Home / Self Care | Payer: Medicare HMO | Source: Ambulatory Visit | Attending: Internal Medicine | Admitting: Internal Medicine

## 2020-03-15 ENCOUNTER — Other Ambulatory Visit: Payer: Self-pay | Admitting: Neurology

## 2020-03-15 VITALS — BP 120/70 | HR 70 | Wt 185.4 lb

## 2020-03-15 DIAGNOSIS — Z79899 Other long term (current) drug therapy: Secondary | ICD-10-CM | POA: Insufficient documentation

## 2020-03-15 DIAGNOSIS — I480 Paroxysmal atrial fibrillation: Secondary | ICD-10-CM | POA: Diagnosis not present

## 2020-03-15 DIAGNOSIS — Z833 Family history of diabetes mellitus: Secondary | ICD-10-CM | POA: Insufficient documentation

## 2020-03-15 DIAGNOSIS — I2510 Atherosclerotic heart disease of native coronary artery without angina pectoris: Secondary | ICD-10-CM | POA: Diagnosis not present

## 2020-03-15 DIAGNOSIS — Z95810 Presence of automatic (implantable) cardiac defibrillator: Secondary | ICD-10-CM | POA: Diagnosis not present

## 2020-03-15 DIAGNOSIS — Z8673 Personal history of transient ischemic attack (TIA), and cerebral infarction without residual deficits: Secondary | ICD-10-CM | POA: Diagnosis not present

## 2020-03-15 DIAGNOSIS — J449 Chronic obstructive pulmonary disease, unspecified: Secondary | ICD-10-CM | POA: Diagnosis not present

## 2020-03-15 DIAGNOSIS — Z888 Allergy status to other drugs, medicaments and biological substances status: Secondary | ICD-10-CM | POA: Diagnosis not present

## 2020-03-15 DIAGNOSIS — Z8249 Family history of ischemic heart disease and other diseases of the circulatory system: Secondary | ICD-10-CM | POA: Diagnosis not present

## 2020-03-15 DIAGNOSIS — Z885 Allergy status to narcotic agent status: Secondary | ICD-10-CM | POA: Diagnosis not present

## 2020-03-15 DIAGNOSIS — Z7901 Long term (current) use of anticoagulants: Secondary | ICD-10-CM | POA: Insufficient documentation

## 2020-03-15 DIAGNOSIS — I5022 Chronic systolic (congestive) heart failure: Secondary | ICD-10-CM | POA: Diagnosis not present

## 2020-03-15 DIAGNOSIS — N1832 Chronic kidney disease, stage 3b: Secondary | ICD-10-CM | POA: Diagnosis not present

## 2020-03-15 DIAGNOSIS — Z87891 Personal history of nicotine dependence: Secondary | ICD-10-CM | POA: Insufficient documentation

## 2020-03-15 DIAGNOSIS — I130 Hypertensive heart and chronic kidney disease with heart failure and stage 1 through stage 4 chronic kidney disease, or unspecified chronic kidney disease: Secondary | ICD-10-CM | POA: Diagnosis not present

## 2020-03-15 DIAGNOSIS — I472 Ventricular tachycardia, unspecified: Secondary | ICD-10-CM

## 2020-03-15 DIAGNOSIS — E1122 Type 2 diabetes mellitus with diabetic chronic kidney disease: Secondary | ICD-10-CM | POA: Diagnosis not present

## 2020-03-15 NOTE — Addendum Note (Signed)
Encounter addended by: Scarlette Calico, RN on: 03/15/2020 9:34 AM  Actions taken: Clinical Note Signed

## 2020-03-15 NOTE — Addendum Note (Signed)
Encounter addended by: Scarlette Calico, RN on: 03/15/2020 9:42 AM  Actions taken: Order list changed, Diagnosis association updated

## 2020-03-15 NOTE — Patient Instructions (Signed)
Please call our office in 1 year to schedule your follow up appointment and echocardiogram  If you have any questions or concerns before your next appointment please send [place] a message through [facility] or call our office at [phone].    TO LEAVE A MESSAGE FOR THE NURSE SELECT OPTION 2, PLEASE LEAVE A MESSAGE INCLUDING: . YOUR NAME . DATE OF BIRTH . CALL BACK NUMBER . REASON FOR CALL**this is important as we prioritize the call backs  [place] AS LONG AS YOU CALL BEFORE 4:00 PM  At the [facility], you and your health needs are our priority. As part of our continuing mission to provide you with exceptional heart care, we have created designated Provider Care Teams. These Care Teams include your primary Cardiologist (physician) and Advanced Practice Providers (APPs- Physician Assistants and Nurse Practitioners) who all work together to provide you with the care you need, when you need it.   You may see any of the following providers on your designated Care Team at your next follow up: Marland Kitchen Dr Glori Bickers . Dr Loralie Champagne . Darrick Grinder, NP . Lyda Jester, PA . Audry Riles, PharmD   Please be sure to bring in all your medications bottles to every appointment.

## 2020-03-15 NOTE — Addendum Note (Signed)
Encounter addended by: Stanford Scotland, RN on: 03/15/2020 9:38 AM  Actions taken: Order list changed

## 2020-03-17 ENCOUNTER — Other Ambulatory Visit: Payer: Self-pay

## 2020-03-17 ENCOUNTER — Telehealth: Payer: Self-pay | Admitting: Adult Health

## 2020-03-17 ENCOUNTER — Ambulatory Visit (INDEPENDENT_AMBULATORY_CARE_PROVIDER_SITE_OTHER): Payer: Medicare HMO | Admitting: Podiatry

## 2020-03-17 DIAGNOSIS — L989 Disorder of the skin and subcutaneous tissue, unspecified: Secondary | ICD-10-CM | POA: Diagnosis not present

## 2020-03-17 DIAGNOSIS — E0843 Diabetes mellitus due to underlying condition with diabetic autonomic (poly)neuropathy: Secondary | ICD-10-CM | POA: Diagnosis not present

## 2020-03-17 DIAGNOSIS — M79676 Pain in unspecified toe(s): Secondary | ICD-10-CM

## 2020-03-17 DIAGNOSIS — B351 Tinea unguium: Secondary | ICD-10-CM
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
## 2020-03-18 NOTE — Telephone Encounter (Signed)
Spoke with the patients wife and she stated the pt did not go to an urgent care or emergency room for treatment.  Patient stated he is feeling better today and the soreness has resolved.  Message sent to PCP.

## 2020-03-19 ENCOUNTER — Telehealth: Payer: Medicare HMO

## 2020-03-22 ENCOUNTER — Ambulatory Visit: Payer: Medicare HMO | Admitting: Pharmacist

## 2020-03-22 DIAGNOSIS — I10 Essential (primary) hypertension: Secondary | ICD-10-CM

## 2020-03-22 DIAGNOSIS — Z794 Long term (current) use of insulin: Secondary | ICD-10-CM
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
## 2020-03-22 NOTE — Patient Instructions (Signed)
Hi [name]!  It was such a pleasure getting to meet you and your wife over the phone this morning. As we discussed, please check your blood sugars every other day and if you feel as though you are having a low blood sugar to ensure that your medications are working properly. I would also recommended checking your blood pressure once a week and weighing yourself daily to ensure you aren't gaining any water weight. Sticking to a routine would likely help you remember!   Please give me a call if you have questions or need anything before our next touch base. As I mentioned, my assistance Mimi will give you a call in a month or so to check in on those blood sugars.  Best, [name]  Jeni Salles, PharmD Clinical Pharmacist [name]   Visit Information  Goals Addressed            This Visit's Progress    Pharmacy Care Plan       CARE PLAN ENTRY  Current Barriers:   Chronic Disease Management support, education, and care coordination needs related to Hypertension, Diabetes, Atrial Fibrillation, Heart Failure, Coronary Artery Disease, COPD, and seizure, and BPH   Hypertension  Pharmacist Clinical Goal(s): o Over the next 90 days, patient will work with PharmD and providers to maintain BP goal < 140/90  Current regimen:   Metoprolol 25mg , 1 tablet once daily  Metoprolol 50mg , 1 tablet once daily  Interventions: o Discussed diet and exercise and affect on blood pressure  Patient self care activities - Over the next 90 days, patient will: o Check blood pressure weekly, document, and provide at future appointments o Ensure daily salt intake < 2300 mg/day  Hyperlipidemia/ coronary artery disease  Pharmacist Clinical Goal(s): o Over the next 180 days, patient will work with PharmD and providers to maintain LDL goal < 70  Current regimen:   Rosuvastatin 40mg , 1 tablet once daily  Fenofibrate 160mg , 1 tablet every evening    Interventions:  We discussed how a diet high in plant sterols (fruits/vegetables/nuts/whole grains/legumes) may reduce your cholesterol.  Encouraged increasing fiber to a daily intake of 10-25g/day   Patient self care activities - Over the next 180 days, patient will: o Continue to work on lifestyle modifications (exercise/ diet).   Heart Failure  Pharmacist Clinical Goal(s) o Over the next 90 days, patient will continue to monitor weight.   Current regimen:   Metoprolol 25mg , 1 tablet once daily  Metoprolol 50mg , 1 tablet once daily   Bidil 20-37.5mg , 2 tablet twice times daily  Entresto 97/103mg , 1 tablet twice daily  Interventions: o We discussed weighing daily; if you gain more than 3 pounds in one day or 5 pounds in one week call your doctor  Patient self care activities o Patient to continue current medications and daily weights.    Diabetes  Pharmacist Clinical Goal(s): o Over the next 90 days, patient will work with PharmD and providers to maintain A1c goal < 7.0  Current regimen:   empagliflozin (Jardiance) 10mg , 1 tablet once daily before breakfast   Interventions: o We discussed using no sugar added flavoring to water  Patient self care activities - Over the next 90 days, patient will: o Check blood sugar every other day, document, and provide at future appointments o Contact provider with any episodes of hypoglycemia  Atrial fibrillation  Current regimen:  o Sotalol 80mg , 1 tablet once daily o Eliquis 5mg , 1 tablet twice daily   Interventions:  o We discussed:  monitoring for signs and symptoms for bleeding (coughing up blood, prolonged nose bleeds, black, tarry stools).  Patient self care activities o Patient will continue current medications.   COPD  Pharmacist Clinical Goal(s) o Over the next 180 days, Prevent worsening of shortness of breath and hospitalizations.  Current regimen:   Albuterol HFA 172mcg/act inhaler, 1 to 2 puffs every  four hours as needed for wheezing or shortness of breath  Spiriva Respimat 2.36mcg/ act inhaler, 2 puffs once daily  Patient self care activities o Patient will continue current medications.   Ventricular tachycardia  Current regimen:   Sotalol 80mg , 1 tablet once daily  Patient self care activities  o Patient will continue current medications.  Seizure disorder  Current regimen:  o Vimpat 100mg , 1 tablet twice daily  Patient self care activities o Patient will continue current medications.  BPH  Current regimen:   Finasteride 5mg , 1 tablet once daily  tamsulosin 0.4mg , 1 capsule once daily  mirabegron ER 12mg , 1 tablet once daily  Patient self care activities o Patient will continue current medications and follow up visits with Dr. Edrick Oh and discuss increasing Myrbetriq at next visit  Memory Loss  Current regimen:  o memantine 10mg , 1 tablet twice daily   Patient self care activities o Patient will continue current medications and follow up visits with Dr. Ellouise Newer.    Medication management  Pharmacist Clinical Goal(s): o Over the next 90 days, patient will work with PharmD and providers to maintain optimal medication adherence  Current pharmacy: [facility]  Interventions o Comprehensive medication review performed. o Continue current medication management strategy  Patient self care activities  o Take medications as prescribed o Report any questions or concerns to PharmD and/or provider(s)  Please see past updates related to this goal by clicking on the "Past Updates" button in the selected goal         The patient verbalized understanding of instructions provided today and declined a print copy of patient instruction materials.   Telephone follow up appointment with pharmacy team member scheduled for: 3 months

## 2020-03-23 ENCOUNTER — Other Ambulatory Visit: Payer: Self-pay | Admitting: Adult Health

## 2020-03-23 DIAGNOSIS — E118 Type 2 diabetes mellitus with unspecified complications: Secondary | ICD-10-CM

## 2020-03-23 DIAGNOSIS — Z794 Long term (current) use of insulin: Secondary | ICD-10-CM

## 2020-04-06 ENCOUNTER — Other Ambulatory Visit (HOSPITAL_COMMUNITY): Payer: Self-pay | Admitting: Internal Medicine

## 2020-04-07 ENCOUNTER — Ambulatory Visit (INDEPENDENT_AMBULATORY_CARE_PROVIDER_SITE_OTHER): Payer: Medicare HMO

## 2020-04-07 DIAGNOSIS — I472 Ventricular tachycardia, unspecified: Secondary | ICD-10-CM

## 2020-04-07 LAB — CUP PACEART REMOTE DEVICE CHECK
Battery Remaining Longevity: 12 mo
Battery Remaining Percentage: 14 %
Brady Statistic RA Percent Paced: 35 %
Brady Statistic RV Percent Paced: 0 %
Date Time Interrogation Session: 20211117043400
HighPow Impedance: 55 Ohm
Implantable Lead Implant Date: 20050922
Implantable Lead Implant Date: 20050922
Implantable Lead Location: 753859
Implantable Lead Location: 753860
Implantable Lead Model: 158
Implantable Lead Model: 5076
Implantable Lead Serial Number: 156891
Implantable Pulse Generator Implant Date: 20100817
Lead Channel Impedance Value: 467 Ohm
Lead Channel Impedance Value: 523 Ohm
Lead Channel Pacing Threshold Amplitude: 0.6 V
Lead Channel Pacing Threshold Amplitude: 1.1 V
Lead Channel Pacing Threshold Pulse Width: 0.4 ms
Lead Channel Pacing Threshold Pulse Width: 0.4 ms
Lead Channel Setting Pacing Amplitude: 2 V
Lead Channel Setting Pacing Amplitude: 2.4 V
Lead Channel Setting Pacing Pulse Width: 0.4 ms
Lead Channel Setting Sensing Sensitivity: 0.6 mV
Pulse Gen Serial Number: 141895

## 2020-04-09 NOTE — Progress Notes (Signed)
Remote ICD transmission.   

## 2020-04-22 ENCOUNTER — Encounter: Payer: Self-pay | Admitting: Family Medicine

## 2020-04-22 ENCOUNTER — Telehealth (INDEPENDENT_AMBULATORY_CARE_PROVIDER_SITE_OTHER): Payer: Medicare HMO | Admitting: Family Medicine

## 2020-04-22 VITALS — BP 120/70 | Wt 185.0 lb

## 2020-04-22 DIAGNOSIS — R059 Cough, unspecified: Secondary | ICD-10-CM | POA: Diagnosis not present

## 2020-04-22 DIAGNOSIS — R0981 Nasal congestion: Secondary | ICD-10-CM

## 2020-04-22 DIAGNOSIS — Z20822 Contact with and (suspected) exposure to covid-19: Secondary | ICD-10-CM | POA: Diagnosis not present

## 2020-04-22 MED ORDER — BENZONATATE 100 MG PO CAPS: 100.0000 mg | ORAL_CAPSULE | Freq: Three times a day (TID) | ORAL | 0 refills | Status: DC | PRN

## 2020-04-22 NOTE — Addendum Note (Signed)
Addended by: Lucretia Kern on: 04/22/2020 12:37 PM   Modules accepted: Level of Service

## 2020-04-22 NOTE — Progress Notes (Signed)
Virtual Visit via Telephone Note  I connected with [name] on 04/22/20 at 12:20 PM EST by telephone and verified that I am speaking with the correct person using two identifiers.   I discussed the limitations, risks, security and privacy concerns of performing an evaluation and management service by telephone and the availability of in person appointments. I also discussed with the patient that there may be a patient responsible charge related to this service. The patient expressed understanding and agreed to proceed.  Location patient: home, [place] Location provider: work or home office Participants present for the call: patient, provider, wife Patient did not have a visit with me in the prior 7 days to address this/these issue(s).   History of Present Illness:  Acute telemedicine visit for cough and congestion: -Onset: 2 days ago -Symptoms include: cough, nasal congestion, sinus pressure -getting covid test today -Denies:SOB, wheeze, CP, increased alb use, fevers, body aches, NVD, inability to get out of bed/eat/drink, he denies chills even though that was listed on intake notes  -wife sick with same  -Pertinent past medical history:COPD - denies any wheezing, SOB -Pertinent medication allergies: see allergies -COVID-19 vaccine status:fully vaccinated + booster and had flu shot   Observations/Objective: Patient sounds cheerful and well on the phone. I do not appreciate any SOB. Speech and thought processing are grossly intact. Patient reported vitals:  Assessment and Plan:  Nasal congestion  Cough  -we discussed possible serious and likely etiologies, options for evaluation and workup, limitations of telemedicine visit vs in person visit, treatment, treatment risks and precautions. Pt prefers to treat via telemedicine empirically rather than in person at this moment.  Query viral upper respiratory illness versus other.  He currently has no shortness of breath, wheezing,  chest pain or increased albuterol use.  He has opted for symptomatic care, summarized in patient instructions and a Tessalon prescription for cough.  He has a Covid test scheduled for later today.  Discussed treatment, potential complications and precautions.  Did discuss possibility of flu, treatment options. Work/School slipped offered:   declined Scheduled follow up with PCP offered: Declined, but they agreed to follow-up if needed Advised to seek prompt in person care if worsening, new symptoms arise, or if is not improving with treatment. Advised of options for inperson care in case PCP office not available. Did let the patient know that I only do telemedicine shifts for [name] on Tuesdays and Thursdays and advised a follow up visit with PCP or at an [facility] if has further questions or concerns.   Follow Up Instructions:  I did not refer this patient for an OV with me in the next 24 hours for this/these issue(s).  I discussed the assessment and treatment plan with the patient. The patient was provided an opportunity to ask questions and all were answered. The patient agreed with the plan and demonstrated an understanding of the instructions.   I spent 15 minutes on this encounter.   Lucretia Kern, DO

## 2020-04-22 NOTE — Patient Instructions (Addendum)
  HOME CARE TIPS:  -[facility] testing information: https://www.[facility].org/ OR [id number] Most pharmacies also offer testing and home test kits.  -I sent the medication(s) we discussed to your pharmacy: Meds ordered this encounter  Medications  . benzonatate (TESSALON PERLES) 100 MG capsule    Sig: Take 1 capsule (100 mg total) by mouth 3 (three) times daily as needed.    Dispense:  20 capsule    Refill:  0    -use your albuterol if needed and call you pulmonologist or primary care doctor for follow-up appointment or seek in person evaluation if any difficulty breathing, symptoms that do not respond to your inhalers or worsening.  -COVID19 outpatient treatment center: [phone] (only call if your Covid test is positive and you are interested in monoclonal antibody treatment which is available to those with risk factors within 10 days of symptom onset)  -can use nasal saline a few times per day if nasal congestion  -stay hydrated, drink plenty of fluids and eat small healthy meals - avoid dairy  -follow up with your doctor in 2-3 days unless improving and feeling better  -stay home while sick, except to seek medical care, and if you have COVID19 please stay home for a full 10 days since the onset of symptoms PLUS one day of no fever and feeling better.  It was nice to meet you today, and I really hope you are feeling better soon. I help  out with telemedicine visits on Tuesdays and Thursdays and am available for visits on those days. If you have any concerns or questions following this visit please schedule a follow up visit with your Primary Care doctor or seek care at a local urgent care clinic to avoid delays in care.    Seek in person care promptly if your symptoms worsen, new concerns arise or you are not improving with treatment. Call 911 and/or seek emergency care if you symptoms are severe or life threatening.

## 2020-04-23 ENCOUNTER — Telehealth: Payer: Self-pay | Admitting: Internal Medicine

## 2020-04-23 MED ORDER — SPIRIVA RESPIMAT 2.5 MCG/ACT IN AERS: 2.0000 | INHALATION_SPRAY | Freq: Every day | RESPIRATORY_TRACT | 2 refills | Status: DC

## 2020-04-23 MED ORDER — ALBUTEROL SULFATE HFA 108 (90 BASE) MCG/ACT IN AERS: 1.0000 | INHALATION_SPRAY | RESPIRATORY_TRACT | 1 refills | Status: AC | PRN

## 2020-04-23 NOTE — Telephone Encounter (Signed)
Spoke with the pt's spouse and scheduled appt with MR since she was overdue  I have refilled albuterol and spiriva to get him through until appt

## 2020-04-27 ENCOUNTER — Encounter: Payer: Self-pay | Admitting: Adult Health

## 2020-04-27 ENCOUNTER — Other Ambulatory Visit: Payer: Self-pay

## 2020-04-27 ENCOUNTER — Ambulatory Visit (INDEPENDENT_AMBULATORY_CARE_PROVIDER_SITE_OTHER): Payer: Medicare HMO | Admitting: Adult Health

## 2020-04-27 VITALS — BP 100/50 | HR 67 | Temp 97.4°F | Ht 70.0 in | Wt 190.6 lb

## 2020-04-27 DIAGNOSIS — J040 Acute laryngitis: Secondary | ICD-10-CM

## 2020-04-27 DIAGNOSIS — Z794 Long term (current) use of insulin: Secondary | ICD-10-CM | POA: Diagnosis not present

## 2020-04-27 DIAGNOSIS — E118 Type 2 diabetes mellitus with unspecified complications: Secondary | ICD-10-CM

## 2020-04-27 DIAGNOSIS — J0140 Acute pansinusitis, unspecified: Secondary | ICD-10-CM

## 2020-04-27 LAB — POCT GLYCOSYLATED HEMOGLOBIN (HGB A1C): Hemoglobin A1C: 6.9 % — AB (ref 4.0–5.6)

## 2020-04-27 MED ORDER — DOXYCYCLINE HYCLATE 100 MG PO CAPS: 100.0000 mg | ORAL_CAPSULE | Freq: Two times a day (BID) | ORAL | 0 refills | Status: DC

## 2020-04-27 MED ORDER — EMPAGLIFLOZIN 10 MG PO TABS: 10.0000 mg | ORAL_TABLET | Freq: Every day | ORAL | 0 refills | Status: DC

## 2020-04-27 NOTE — Patient Instructions (Signed)
It was great seeing you today   Your A1c was 6.9 - I am going to keep you on Jardiance 10 mg - follow up in three months   I am also going to prescribe you Doxycycline to help with the sinus infection.
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
## 2020-04-28 ENCOUNTER — Other Ambulatory Visit (HOSPITAL_COMMUNITY): Payer: Self-pay | Admitting: Internal Medicine

## 2020-04-28 ENCOUNTER — Other Ambulatory Visit: Payer: Self-pay | Admitting: Neurology

## 2020-04-28 ENCOUNTER — Other Ambulatory Visit: Payer: Self-pay | Admitting: Adult Health

## 2020-04-28 DIAGNOSIS — E1165 Type 2 diabetes mellitus with hyperglycemia: Secondary | ICD-10-CM

## 2020-04-29 ENCOUNTER — Ambulatory Visit (INDEPENDENT_AMBULATORY_CARE_PROVIDER_SITE_OTHER): Payer: Medicare HMO

## 2020-04-29 DIAGNOSIS — I472 Ventricular tachycardia, unspecified: Secondary | ICD-10-CM

## 2020-04-30 LAB — CUP PACEART REMOTE DEVICE CHECK
Battery Remaining Longevity: 12 mo
Battery Remaining Percentage: 14 %
Brady Statistic RA Percent Paced: 26 %
Brady Statistic RV Percent Paced: 0 %
Date Time Interrogation Session: 20211209072000
HighPow Impedance: 54 Ohm
Implantable Lead Implant Date: 20050922
Implantable Lead Implant Date: 20050922
Implantable Lead Location: 753859
Implantable Lead Location: 753860
Implantable Lead Model: 158
Implantable Lead Model: 5076
Implantable Lead Serial Number: 156891
Implantable Pulse Generator Implant Date: 20100817
Lead Channel Impedance Value: 443 Ohm
Lead Channel Impedance Value: 500 Ohm
Lead Channel Pacing Threshold Amplitude: 0.6 V
Lead Channel Pacing Threshold Amplitude: 1.1 V
Lead Channel Pacing Threshold Pulse Width: 0.4 ms
Lead Channel Pacing Threshold Pulse Width: 0.4 ms
Lead Channel Setting Pacing Amplitude: 2 V
Lead Channel Setting Pacing Amplitude: 2.4 V
Lead Channel Setting Pacing Pulse Width: 0.4 ms
Lead Channel Setting Sensing Sensitivity: 0.6 mV
Pulse Gen Serial Number: 141895

## 2020-05-03 ENCOUNTER — Telehealth: Payer: Self-pay
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
## 2020-05-03 NOTE — Telephone Encounter (Signed)
Patient's wife returning call. 

## 2020-05-03 NOTE — Telephone Encounter (Signed)
Latitude alert received for 1 treated event (05/01/20 @ 21:55) logged 52 seconds w/ rate 210's bpm; EGM suggest VT that received ATP x 1 followed by a single 41 J shock that terminated the arrhythmia. 5 nonsustV logged 8 to 25 seconds w/ rates 110's to 140's bpm; EGMs suggest ST & SVT. Per EMR: Eliquis 5 mg BID, Entresto 97-103 mg BID, Toprol- XL 50 mg every evening, Sotalol 80 mg daily.  Attempted to call patients house and cell phone as well as wife (DPR, [name]). All of the numbers are the same. LMOVM.  Needs assessed, medication compliance, shock plan/driving restrictions discussed.

## 2020-05-03 NOTE — Telephone Encounter (Signed)
Someone left a message that the pt was shocked. She states he did not have any other symptoms. I do see that the patient sent a transmission on 05/01/2020 and he did get shocked. The patient phone number is [phone].

## 2020-05-03 NOTE — Telephone Encounter (Signed)
Returning phone call.   No answer, LMOVM.

## 2020-05-03 NOTE — Telephone Encounter (Signed)
The pt wife left a message on the answering machine. Returning nurse call.

## 2020-05-04 NOTE — Telephone Encounter (Signed)
L  his BP at last visit was only about 100 so will not increase his BB--  Will need followup with APP Thanks SK

## 2020-05-12 NOTE — Addendum Note (Signed)
Addended by: Cheri Kearns A on: 05/12/2020 09:11 AM   Modules accepted: Level of Service

## 2020-05-12 NOTE — Progress Notes (Signed)
Remote ICD transmission.   
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
## 2020-05-17 ENCOUNTER — Ambulatory Visit (INDEPENDENT_AMBULATORY_CARE_PROVIDER_SITE_OTHER): Payer: Medicare HMO | Admitting: Physician Assistant

## 2020-05-17 ENCOUNTER — Other Ambulatory Visit: Payer: Self-pay

## 2020-05-17 ENCOUNTER — Encounter: Payer: Self-pay | Admitting: Physician Assistant

## 2020-05-17 VITALS — BP 112/80 | HR 55 | Ht 70.0 in | Wt 183.0 lb

## 2020-05-17 DIAGNOSIS — I5022 Chronic systolic (congestive) heart failure: Secondary | ICD-10-CM | POA: Diagnosis not present

## 2020-05-17 DIAGNOSIS — I255 Ischemic cardiomyopathy: Secondary | ICD-10-CM | POA: Diagnosis not present

## 2020-05-17 DIAGNOSIS — I4891 Unspecified atrial fibrillation: Secondary | ICD-10-CM | POA: Diagnosis not present

## 2020-05-17 DIAGNOSIS — Z79899 Other long term (current) drug therapy: Secondary | ICD-10-CM | POA: Diagnosis not present

## 2020-05-17 DIAGNOSIS — I472 Ventricular tachycardia, unspecified: Secondary | ICD-10-CM

## 2020-05-17 DIAGNOSIS — Z95810 Presence of automatic (implantable) cardiac defibrillator: Secondary | ICD-10-CM | POA: Diagnosis not present

## 2020-05-17 LAB — MAGNESIUM: Magnesium: 2.1 mg/dL (ref 1.6–2.3)

## 2020-05-17 LAB — BASIC METABOLIC PANEL
BUN/Creatinine Ratio: 11 (ref 10–24)
BUN: 18 mg/dL (ref 8–27)
CO2: 24 mmol/L (ref 20–29)
Calcium: 10.3 mg/dL — ABNORMAL HIGH (ref 8.6–10.2)
Chloride: 104 mmol/L (ref 96–106)
Creatinine, Ser: 1.63 mg/dL — ABNORMAL HIGH (ref 0.76–1.27)
GFR calc Af Amer: 48 mL/min/{1.73_m2} — ABNORMAL LOW (ref >59)
GFR calc non Af Amer: 41 mL/min/{1.73_m2} — ABNORMAL LOW (ref >59)
Glucose: 112 mg/dL — ABNORMAL HIGH (ref 65–99)
Potassium: 4.5 mmol/L (ref 3.5–5.2)
Sodium: 141 mmol/L (ref 134–144)

## 2020-05-17 LAB — CBC
Hematocrit: 49.9 % (ref 37.5–51.0)
Hemoglobin: 17 g/dL (ref 13.0–17.7)
MCH: 31.1 pg (ref 26.6–33.0)
MCHC: 34.1 g/dL (ref 31.5–35.7)
MCV: 91 fL (ref 79–97)
Platelets: 235 10*3/uL (ref 150–450)
RBC: 5.46 x10E6/uL (ref 4.14–5.80)
RDW: 13.2 % (ref 11.6–15.4)
WBC: 6.1 10*3/uL (ref 3.4–10.8)

## 2020-05-17 MED ORDER — RANOLAZINE ER 500 MG PO TB12
500.0000 mg | ORAL_TABLET | Freq: Every day | ORAL | 1 refills | Status: DC
Start: 2020-05-17 — End: 2020-09-20

## 2020-05-17 NOTE — Patient Instructions (Addendum)
Medication Instructions:   START  TAKING RENEXA  500 MG ONCE A DAY   *If you need a refill on your cardiac medications before your next appointment, please call your pharmacy*   Lab Work: BMET MAG  AND CBC  TODAY   If you have labs (blood work) drawn today and your tests are completely normal, you will receive your results only by: Marland Kitchen MyChart Message (if you have MyChart) OR . A paper copy in the mail If you have any lab test that is abnormal or we need to change your treatment, we will call you to review the results.   Testing/Procedures: NONE ORDERED  TODAY   Follow-Up: At [facility], you and your health needs are our priority.  As part of our continuing mission to provide you with exceptional heart care, we have created designated Provider Care Teams.  These Care Teams include your primary Cardiologist (physician) and Advanced Practice Providers (APPs -  Physician Assistants and Nurse Practitioners) who all work together to provide you with the care you need, when you need it.  We recommend signing up for the patient portal called "MyChart".  Sign up information is provided on this After Visit Summary.  MyChart is used to connect with patients for Virtual Visits (Telemedicine).  Patients are able to view lab/test results, encounter notes, upcoming appointments, etc.  Non-urgent messages can be sent to your provider as well.   To learn more about what you can do with MyChart, go to [email or web address].    Your next appointment:   2 month(s)  The format for your next appointment:   In Person  Provider:   You may see Dr. Johney Frame   Other Instructions

## 2020-05-19 ENCOUNTER — Other Ambulatory Visit: Payer: Self-pay | Admitting: Internal Medicine

## 2020-05-19 ENCOUNTER — Telehealth: Payer: Self-pay | Admitting: *Deleted

## 2020-05-19 DIAGNOSIS — W57XXXA Bitten or stung by nonvenomous insect and other nonvenomous arthropods, initial encounter: Secondary | ICD-10-CM

## 2020-05-19 DIAGNOSIS — G4733 Obstructive sleep apnea (adult) (pediatric): Secondary | ICD-10-CM | POA: Diagnosis not present

## 2020-05-19 NOTE — Telephone Encounter (Signed)
Spoke with patient aware of results and verbalized understanding.She stated that she wanted to talk to Dr. Johney Frame about the Ranexa prescription as recommended to restart by Francis Dowse due to shock and VT episode This was explained by the wife but she is hesitant that should he really restart it ? She hasn't picked up from pharmacy yet

## 2020-05-20 DIAGNOSIS — J040 Acute laryngitis: Secondary | ICD-10-CM | POA: Diagnosis not present

## 2020-05-20 DIAGNOSIS — R1310 Dysphagia, unspecified: Secondary | ICD-10-CM | POA: Diagnosis not present

## 2020-05-24 DIAGNOSIS — E119 Type 2 diabetes mellitus without complications: Secondary | ICD-10-CM | POA: Diagnosis not present

## 2020-05-24 DIAGNOSIS — H25013 Cortical age-related cataract, bilateral: Secondary | ICD-10-CM | POA: Diagnosis not present

## 2020-05-24 DIAGNOSIS — H524 Presbyopia: Secondary | ICD-10-CM | POA: Diagnosis not present

## 2020-05-24 DIAGNOSIS — H35033 Hypertensive retinopathy, bilateral: Secondary | ICD-10-CM | POA: Diagnosis not present

## 2020-05-24 DIAGNOSIS — H2513 Age-related nuclear cataract, bilateral: Secondary | ICD-10-CM | POA: Diagnosis not present

## 2020-05-24 LAB — HM DIABETES EYE EXAM

## 2020-05-26 ENCOUNTER — Encounter: Payer: Self-pay | Admitting: Family Medicine

## 2020-05-26 ENCOUNTER — Other Ambulatory Visit: Payer: Self-pay

## 2020-05-26 ENCOUNTER — Telehealth (INDEPENDENT_AMBULATORY_CARE_PROVIDER_SITE_OTHER): Payer: Medicare HMO | Admitting: Family Medicine

## 2020-05-26 DIAGNOSIS — J209 Acute bronchitis, unspecified: Secondary | ICD-10-CM

## 2020-05-26 DIAGNOSIS — J440 Chronic obstructive pulmonary disease with acute lower respiratory infection: Secondary | ICD-10-CM | POA: Diagnosis not present

## 2020-05-26 MED ORDER — DOXYCYCLINE HYCLATE 100 MG PO CAPS: 100.0000 mg | ORAL_CAPSULE | Freq: Two times a day (BID) | ORAL | 0 refills | Status: DC
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
## 2020-05-31 ENCOUNTER — Ambulatory Visit (INDEPENDENT_AMBULATORY_CARE_PROVIDER_SITE_OTHER): Payer: Medicare HMO

## 2020-05-31 DIAGNOSIS — I472 Ventricular tachycardia, unspecified: Secondary | ICD-10-CM

## 2020-05-31 LAB — CUP PACEART REMOTE DEVICE CHECK
Battery Remaining Longevity: 9 mo
Battery Remaining Percentage: 10 %
Brady Statistic RA Percent Paced: 19 %
Brady Statistic RV Percent Paced: 0 %
Date Time Interrogation Session: 20220110042100
HighPow Impedance: 58 Ohm
Implantable Lead Implant Date: 20050922
Implantable Lead Implant Date: 20050922
Implantable Lead Location: 753859
Implantable Lead Location: 753860
Implantable Lead Model: 158
Implantable Lead Model: 5076
Implantable Lead Serial Number: 156891
Implantable Pulse Generator Implant Date: 20100817
Lead Channel Impedance Value: 468 Ohm
Lead Channel Impedance Value: 496 Ohm
Lead Channel Pacing Threshold Amplitude: 0.6 V
Lead Channel Pacing Threshold Amplitude: 1.1 V
Lead Channel Pacing Threshold Pulse Width: 0.4 ms
Lead Channel Pacing Threshold Pulse Width: 0.4 ms
Lead Channel Setting Pacing Amplitude: 2 V
Lead Channel Setting Pacing Amplitude: 2.4 V
Lead Channel Setting Pacing Pulse Width: 0.4 ms
Lead Channel Setting Sensing Sensitivity: 0.6 mV
Pulse Gen Serial Number: 141895

## 2020-06-11 ENCOUNTER — Encounter: Payer: Self-pay | Admitting: Internal Medicine

## 2020-06-11 ENCOUNTER — Other Ambulatory Visit: Payer: Self-pay

## 2020-06-11 ENCOUNTER — Ambulatory Visit (INDEPENDENT_AMBULATORY_CARE_PROVIDER_SITE_OTHER): Payer: Medicare HMO | Admitting: Internal Medicine

## 2020-06-11 VITALS — BP 136/74 | HR 57 | Ht 70.0 in | Wt 183.8 lb

## 2020-06-11 DIAGNOSIS — I472 Ventricular tachycardia, unspecified: Secondary | ICD-10-CM

## 2020-06-11 DIAGNOSIS — I5022 Chronic systolic (congestive) heart failure: Secondary | ICD-10-CM

## 2020-06-11 DIAGNOSIS — I480 Paroxysmal atrial fibrillation: Secondary | ICD-10-CM

## 2020-06-11 DIAGNOSIS — I255 Ischemic cardiomyopathy: Secondary | ICD-10-CM

## 2020-06-11 DIAGNOSIS — I2510 Atherosclerotic heart disease of native coronary artery without angina pectoris: Secondary | ICD-10-CM | POA: Diagnosis not present

## 2020-06-11 LAB — CUP PACEART INCLINIC DEVICE CHECK
Brady Statistic RA Percent Paced: 20 %
Brady Statistic RV Percent Paced: 1 % — CL
Date Time Interrogation Session: 20220121095406
HighPow Impedance: 46 Ohm
HighPow Impedance: 57 Ohm
Implantable Lead Implant Date: 20050922
Implantable Lead Implant Date: 20050922
Implantable Lead Location: 753859
Implantable Lead Location: 753860
Implantable Lead Model: 158
Implantable Lead Model: 5076
Implantable Lead Serial Number: 156891
Implantable Pulse Generator Implant Date: 20100817
Lead Channel Impedance Value: 464 Ohm
Lead Channel Impedance Value: 493 Ohm
Lead Channel Pacing Threshold Amplitude: 0.6 V
Lead Channel Pacing Threshold Amplitude: 1 V
Lead Channel Pacing Threshold Pulse Width: 0.4 ms
Lead Channel Pacing Threshold Pulse Width: 0.4 ms
Lead Channel Sensing Intrinsic Amplitude: 23.3 mV
Lead Channel Sensing Intrinsic Amplitude: 3.2 mV
Lead Channel Setting Pacing Amplitude: 2 V
Lead Channel Setting Pacing Amplitude: 2.4 V
Lead Channel Setting Pacing Pulse Width: 0.4 ms
Lead Channel Setting Sensing Sensitivity: 0.6 mV
Pulse Gen Serial Number: 141895

## 2020-06-11 LAB — BASIC METABOLIC PANEL
BUN/Creatinine Ratio: 13 (ref 10–24)
BUN: 19 mg/dL (ref 8–27)
CO2: 24 mmol/L (ref 20–29)
Calcium: 9.9 mg/dL (ref 8.6–10.2)
Chloride: 102 mmol/L (ref 96–106)
Creatinine, Ser: 1.51 mg/dL — ABNORMAL HIGH (ref 0.76–1.27)
GFR calc Af Amer: 52 mL/min/{1.73_m2} — ABNORMAL LOW (ref >59)
GFR calc non Af Amer: 45 mL/min/{1.73_m2} — ABNORMAL LOW (ref >59)
Glucose: 132 mg/dL — ABNORMAL HIGH (ref 65–99)
Potassium: 4.6 mmol/L (ref 3.5–5.2)
Sodium: 139 mmol/L (ref 134–144)

## 2020-06-11 LAB — MAGNESIUM: Magnesium: 1.9 mg/dL (ref 1.6–2.3)

## 2020-06-11 NOTE — Patient Instructions (Signed)
Medication Instructions:  none *If you need a refill on your cardiac medications before your next appointment, please call your pharmacy*   Lab Work: BMP, Mag If you have labs (blood work) drawn today and your tests are completely normal, you will receive your results only by:  [place] (if you have MyChart) OR  A paper copy in the mail If you have any lab test that is abnormal or we need to change your treatment, we will call you to review the results.   Testing/Procedures: none   Follow-Up: At [facility], you and your health needs are our priority.  As part of our continuing mission to provide you with exceptional heart care, we have created designated Provider Care Teams.  These Care Teams include your primary Cardiologist (physician) and Advanced Practice Providers (APPs -  Physician Assistants and Nurse Practitioners) who all work together to provide you with the care you need, when you need it.  We recommend signing up for the patient portal called "MyChart".  Sign up information is provided on this After Visit Summary.  MyChart is used to connect with patients for Virtual Visits (Telemedicine).  Patients are able to view lab/test results, encounter notes, upcoming appointments, etc.  Non-urgent messages can be sent to your provider as well.   To learn more about what you can do with MyChart, go to [email or web address].    Your next appointment:   3 month(s)  The format for your next appointment:   In Person  Provider:   [name], MD   Other Instructions
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
## 2020-06-14 NOTE — Progress Notes (Signed)
Remote ICD transmission.   

## 2020-06-18 ENCOUNTER — Telehealth: Payer: Self-pay | Admitting: Pharmacist

## 2020-06-18 NOTE — Chronic Care Management (AMB) (Signed)
I left the patient a message about his upcoming appointment on 06/21/2020 @ 9:00am with the clinical pharmacist. He was asked to please have all medication on hand to review with the pharmacist.   Neita Goodnight) Mare Ferrari, [place] Assistant [phone]

## 2020-06-21 ENCOUNTER — Telehealth: Payer: Medicare HMO

## 2020-06-21 NOTE — Chronic Care Management (AMB) (Unsigned)
Chronic Care Management Pharmacy  Name: [name]  MRN: [id number] DOB: 03-Aug-1945  Initial Questions: 1. Have you seen any other providers since your last visit? Yes  2. Any changes in your medicines or health? No   Chief Complaint/ HPI  [name],  75 y.o. , male presents for their Follow-Up CCM visit with the clinical pharmacist via telephone due to COVID-19 Pandemic.  PCP : Dorothyann Peng, NP  Their chronic conditions include: HTN, diabetes, atrial fibrillation, heart failure, coronary artery disease, COPD, seizure, and BPH   Office Visits: 04/27/20 Dorothyann Peng, NP: Patient presented for diabetes follow up. A1c increased to 6.9%. Referral placed for ENT. Prescribed doxycycline 100 mg BID.  04/22/20 Colin Benton, DO: Patient presented for video visit with cough. Prescribed benzonatate PRN.  02/18/20 Ofilia Neas, LPN: Patient presented for Medicare annual wellness exam. Referral sent to community care coordination.  01/27/20 Dorothyann Peng, NP: Patient presented for diabetes follow up. Patient received influenza immunization. A1c increased to 6.6%  continued Jardiance. Follow up in 3 months.  10/29/19 Dorothyann Peng, NP: Patient presented for diabetes follow up. A1c decreased to 6.2%. D/c'd Lantus. Follow up in 3 months.  Consult Visit: 06/11/20 [name], MD (cardiology): Patient presented for CHF and CAD follow up.   05/26/20 Lamar Blinks, MD (fam med): Patient presented for acute telephone visit for acute bronchitis with COPD. Prescribed doxycycline BID.  05/17/20 Tommye Standard, PA-C (cardiology): Patient presented for CHF and CAD follow up. Restarted Ranexa 500 mg daily.   03/17/20 Daylene Katayama, DPM (podiatry): Patient presented for debridement of toenails and diabetic foot exam. Follow up in 3 months.   03/15/20 Glori Bickers, MD (cardiology): Patient presented to HF clinic for follow up. Continue Lasix M/W/F , Toprol, Bidil and Entresto. Follow up in 1 year.    02/27/20 [name], MD (cardiology): Patient presented for routine electrophysiology follow up. Patient reports doing very well and is very active. D/C'd Ranexa and plan to follow him closely on sotalol. Follow up in 6 months.  11/26/19 Daylene Katayama, DPM (podiatry): Patient presented for debridement of toenails and diabetic foot exam. Follow up in 3 months.   08/11/2019- Cardiology- [name], MD- Patient presented for office visit for routine electrophysiology follow up. Patient presented to be stable. No changes in medication regimen. Follow up in 6 months.   07/09/2019- Cardiology- Glori Bickers, MD- Patient presented for office visit for follow up. Patient presented to be stable. No changes made. Patient to discuss with PCP in regards to [place]. If started, Lasix to be discontinued.   Medications: Outpatient Encounter Medications as of 06/21/2020  Medication Sig  . albuterol (VENTOLIN HFA) 108 (90 Base) MCG/ACT inhaler Inhale 1-2 puffs into the lungs every 4 (four) hours as needed for wheezing or shortness of breath (or cough).  . benzonatate (TESSALON PERLES) 100 MG capsule Take 1 capsule (100 mg total) by mouth 3 (three) times daily as needed.  Marland Kitchen BIDIL 20-37.5 MG tablet TAKE 2 TABLETS BY MOUTH 2 (TWO) TIMES DAILY.  Marland Kitchen Blood Glucose Monitoring Suppl (ONETOUCH VERIO FLEX SYSTEM) w/Device KIT USE TO TEST BLOOD GLUCOSE THREE TIMES DAILY  . Blood Pressure Monitoring (BLOOD PRESSURE MONITOR/M CUFF) MISC Upper arm blood pressure monitor  . doxycycline (VIBRAMYCIN) 100 MG capsule Take 1 capsule (100 mg total) by mouth 2 (two) times daily.  Marland Kitchen doxycycline (VIBRAMYCIN) 100 MG capsule Take 1 capsule (100 mg total) by mouth 2 (two) times daily.  Marland Kitchen EASY COMFORT PEN NEEDLES 31G X 5 MM MISC  USE DAILY AT BEDTIME.  Marland Kitchen ELIQUIS 5 MG TABS tablet TAKE 1 TABLET (5 MG TOTAL) BY MOUTH 2 (TWO) TIMES DAILY.  Marland Kitchen empagliflozin (JARDIANCE) 10 MG TABS tablet Take 1 tablet (10 mg total) by mouth daily before  breakfast.  . ENTRESTO 97-103 MG TAKE 1 TABLET BY MOUTH 2 (TWO) TIMES DAILY.  . fenofibrate 160 MG tablet TAKE 1 TABLET BY MOUTH EVERY EVENING.  . finasteride (PROSCAR) 5 MG tablet TAKE ONE TABLET BY MOUTH ONCE DAILY  . Lancets (ONETOUCH DELICA PLUS [id number]) MISC USE TO TEST BLOOD SUGAR THREE TIMES PER DAY  . Lancets (ONETOUCH ULTRASOFT) lancets Use as instructed  . levocetirizine (XYZAL) 5 MG tablet TAKE 1 TABLET (5 MG TOTAL) BY MOUTH EVERY EVENING.  . memantine (NAMENDA) 10 MG tablet TAKE 1 TABLET (10 MG TOTAL) BY MOUTH 2 (TWO) TIMES DAILY.  . metoprolol succinate (TOPROL-XL) 50 MG 24 hr tablet TAKE 1 TABLET (50 MG TOTAL) BY MOUTH EVERY EVENING. TAKE WITH OR IMMEDIATELY FOLLOWING A MEAL.  . mirabegron ER (MYRBETRIQ) 25 MG TB24 tablet Take 25 mg by mouth daily.   . nitroGLYCERIN (NITROSTAT) 0.4 MG SL tablet Place 1 tablet (0.4 mg total) under the tongue every 5 (five) minutes as needed for chest pain.  Glory Rosebush VERIO test strip USE TO TEST BLOOD GLUCOSE THREE TIMES A DAY MEDICALLY NECESSARY  . ranolazine (RANEXA) 500 MG 12 hr tablet Take 1 tablet (500 mg total) by mouth daily.  . rosuvastatin (CRESTOR) 40 MG tablet TAKE 1 TABLET (40 MG TOTAL) BY MOUTH EVERY EVENING.  . sotalol (BETAPACE) 80 MG tablet TAKE 1 TABLET (80 MG TOTAL) BY MOUTH DAILY.  . tamsulosin (FLOMAX) 0.4 MG CAPS capsule Take 0.4 mg by mouth at bedtime.   . Tiotropium Bromide Monohydrate (SPIRIVA RESPIMAT) 2.5 MCG/ACT AERS Inhale 2 puffs into the lungs daily.  Marland Kitchen VIMPAT 100 MG TABS TAKE ONE TABLET BY MOUTH TWICE DAILY   No facility-administered encounter medications on file as of 06/21/2020.   Patient currently qualifies for medicare extra help. Recommended to reach out if he does not qualify next year and plan to discuss patient assistance options for Entresto, Eliquis & Jardiance.   Current Diagnosis/Assessment:  Goals Addressed   None      AFIB  Patient is currently rhythm controlled.  Patient is currently  controlled on the following medications:   Sotalol 25m, 1 tablet once daily  Anticoagulation CHA2DS2/VASc: 7   Eliquis 582m 1 tablet twice daily   We discussed:  monitoring for signs and symptoms for bleeding (coughing up blood, prolonged nose bleeds, black, tarry stools).   Plan Continue current medications  COPD    Last spirometry score: FVC: 100%, FEV1: 95%, FEV1/FVC: 94%    Gold Grade: Gold 1 (FEV1>80%)  Eosinophil count:   Lab Results  Component Value Date/Time   EOSPCT 2.2 08/01/2019 11:25 AM  %                               Eos (Absolute):  Lab Results  Component Value Date/Time   EOSABS 0.1 08/01/2019 11:25 AM   EOSABS 0.1 12/25/2016 12:11 PM    Tobacco Status:  Social History   Tobacco Use  Smoking Status Former Smoker  . Packs/day: 2.00  . Years: 23.00  . Pack years: 46.00  . Types: Cigarettes  . Start date: 08/21/1961  . Quit date: 01/03/1985  . Years since quitting: 35.4  Smokeless Tobacco Never  Used   Patient has failed these meds in past: Symbicort   Patient is currently controlled on the following medications:   Albuterol HFA 135mg/act inhaler, 1 to 2 puffs every four hours as needed for wheezing or shortness of breath  Spiriva Respimat 2.567m/ act inhaler, 2 puffs once daily  Using maintenance inhaler regularly? Yes Frequency of rescue inhaler use:  prn  We discussed:  proper inhaler technique  Plan Continue current medications  Diabetes  A1C < 7  Recent Relevant Labs: Lab Results  Component Value Date/Time   HGBA1C 6.9 (A) 04/27/2020 10:18 AM   HGBA1C 6.6 (A) 01/27/2020 09:32 AM   HGBA1C 6.2 10/29/2019 08:12 AM   HGBA1C 7.1 (H) 08/01/2019 11:25 AM   HGBA1C 7.5 (H) 04/15/2019 01:52 PM   HGBA1C 7.8 01/04/2018 02:18 PM   MICROALBUR <0.7 08/01/2019 11:25 AM    Checking BG: Daily - hasn't taken in last 2 weeks; daily  Recent BG Readings: (fasting) 135  Patient is currently controlled on the following medications:    empagliflozin (Jardiance) 1056m1 tablet once daily before breakfast  Last diabetic Eye exam: No results found for: HMDIABEYEEXA  - last eye exam was in January  Last diabetic Foot exam: No results found for: HMDIABFOOTEX  - goes to Dr. EvaAmalia Haileyr foot exams   We discussed: diet and exercise extensively.  -Patient has been using flavors in water with sugar and wife found out 2-3 days ago and threw them away -Discussed switching to no sugar brands such as Mio or crystal light for added flavor but no sugars -Discussed the importance of checking blood sugars at home -Patient's wife specifically requested DM eye exam - needs referral for ophthalmology.  Plan Check blood sugars at home every other day. Follow up with [facility] in 1 month for BG assessment. [facility].  Continue current medications   Heart Failure  Type: Systolic  Last ejection fraction: 25 to 30% (07/21/2019)  NYHA Class: II (slight limitation of activity) AHA HF Stage: B (Heart disease present - no symptoms present)  Patient has failed these meds in past: spironolactone (gynecomastia)  Patient is currently controlled on the following medications:   Metoprolol 27m75m tablet once daily  Metoprolol 50mg27mtablet once daily   Bidil 20-37.5mg, 24mablet twice times daily  Entresto 97/103mg, 30mblet twice daily  We discussed weighing daily; if you gain more than 3 pounds in one day or 5 pounds in one week call your doctor; patient does not check his weight at home  Plan Continue current medications  Hypertension  Denies dizziness, lightheadeness, orthostatic hypotension  BP today is:  <140/90  Office blood pressures are  BP Readings from Last 3 Encounters:  06/11/20 136/74  05/17/20 112/80  04/27/20 (!) 100/50   Patient checks BP at home none   Patient home BP readings are ranging: 120-140/ 80-85  Patient is controlled on:   Metoprolol 50mg, 140mlet once daily    Metoprolol 27mg, 1 20met once daily  We discussed diet and exercise extensively  . Discussed diet modifications. DASH diet:  following a diet emphasizing fruits and vegetables and low-fat dairy products along with whole grains, fish, poultry, and nuts. Reducing red meats and sugars.  . Exercising . Importance of monitoring BP at home while taking BP medications - recommended checking once a week  Plan Patient's wife will look into getting BP cuff - could not afford the one at the  pharmacy and insurance did not cover. Continue current medications   Hyperlipidemia/ CAD    Lipid Panel     Component Value Date/Time   CHOL 115 08/01/2019 1125   CHOL 137 08/05/2015 1108   TRIG 134.0 08/01/2019 1125   HDL 27.30 (L) 08/01/2019 1125   HDL 32 (L) 08/05/2015 1108   CHOLHDL 4 08/01/2019 1125   VLDL 26.8 08/01/2019 1125   LDLCALC 61 08/01/2019 1125   LDLCALC 86 08/05/2015 1108   LABVLDL 19 08/05/2015 1108     The ASCVD Risk score (Goff DC Jr., et al., 2013) failed to calculate for the following reasons:   The patient has a prior MI or stroke diagnosis   Patient is currently controlled on the following medications:   Rosuvastatin 83m, 1 tablet once daily  Fenofibrate 1616m 1 tablet every evening   We discussed:  diet and exercise extensively.    Plan Continue current medications  Ventricular tachycardia   Patient has failed these meds in past:  Patient is currently controlled on the following medications:   Sotalol 8057m1 tablet once daily  We discussed: Ranexa was discontinued  Plan Continue current medications.   Seizure disorder  Last episode: last year   Patient has failed these meds in past:  Patient is currently controlled on the following medications:   Vimpat 100m50m tablet twice daily   Plan Continue current medications  BPH  Patient is currently controlled on the following medications:   Finasteride 5mg,14mtablet once daily  tamsulosin 0.4mg, 77mcapsule once daily  mirabegron ER 25mg, 36mblet once daily  Plan Managed by Martin Edrick Ohologist). Patient's wife will discuss about increasing Myrbetriq during Nov appt. Continue current medications  Memory loss  Patient is currently managed on the following medications:   memantine 10mg, 16mlet twice daily   Plan Managed by Dr. Karen AqEllouise Newerogist).  Continue current medications  CKD, stage III   Lab Results  Component Value Date   CREATININE 1.51 (H) 06/11/2020   CREATININE 1.63 (H) 05/17/2020   CREATININE 1.64 (H) 02/27/2020   Estimated Creatinine Clearance: 45 mL/min (A) (by C-G formula based on SCr of 1.51 mg/dL (H)).   Plan Kidney function improved. Continue to monitor and adjust medications as needed.  Managed by nephrologist.   Vaccines   Reviewed and discussed patient's vaccination history.    Immunization History  Administered Date(s) Administered  . Fluad Quad(high Dose 65+) 01/15/2019, 01/27/2020  . Influenza Split 02/22/2011  . Influenza Whole 04/12/2007, 02/24/2008, 02/18/2009, 02/18/2010  . Influenza, High Dose Seasonal PF 02/09/2016, 03/13/2017, 02/08/2018  . Influenza,inj,Quad PF,6+ Mos 03/09/2017  . Influenza-Unspecified 02/06/2012  . Moderna Sars-Covid-2 Vaccination 07/05/2019, 08/02/2019, 04/02/2020  . Pneumococcal Conjugate-13 01/27/2016  . Pneumococcal Polysaccharide-23 02/24/2008, 02/08/2018   Patient received Moderna COVID vaccine on 07/05/19 and 08/02/19. Sent message to CMA to add to chart.  Plan  Recommended patient receive Shingrix vaccine at pharmacy..   Medication Management   [facility] uses [facility] Uses pill box? Yes Pt endorses 90% compliance  We discussed: Current pharmacy is preferred with insurance plan and patient is satisfied with pharmacy services  Plan  Continue current medication management strategy Same price as upstream   Follow up: 3 month phone  visit 1 month [facility] DM assessment  MadelineJeni Salles Clinical Pharmacist [facility]

## 2020-06-22 ENCOUNTER — Other Ambulatory Visit: Payer: Self-pay | Admitting: Adult Health

## 2020-06-22 ENCOUNTER — Ambulatory Visit: Payer: Medicare HMO | Admitting: Pharmacist

## 2020-06-22 DIAGNOSIS — D492 Neoplasm of unspecified behavior of bone, soft tissue, and skin: Secondary | ICD-10-CM

## 2020-06-22 DIAGNOSIS — I10 Essential (primary) hypertension: Secondary | ICD-10-CM

## 2020-06-22 DIAGNOSIS — I480 Paroxysmal atrial fibrillation: Secondary | ICD-10-CM

## 2020-06-22 NOTE — Chronic Care Management (AMB) (Signed)
Chronic Care Management Pharmacy  Name: [name]  MRN: [id number] DOB: [birth year]  Initial Questions: 1. Have you seen any other providers since your last visit? Yes  2. Any changes in your medicines or health? No   Chief Complaint/ HPI  [name],  75 y.o. , male presents for their Follow-Up CCM visit with the clinical pharmacist via telephone due to COVID-19 Pandemic.  PCP : Dorothyann Peng, NP  Their chronic conditions include: HTN, diabetes, atrial fibrillation, heart failure, coronary artery disease, COPD, seizure, and BPH   Office Visits: 04/27/20 Dorothyann Peng, NP: Patient presented for diabetes follow up. A1c increased to 6.9%. Referral placed for ENT. Prescribed doxycycline 100 mg BID.  04/22/20 Colin Benton, DO: Patient presented for video visit with cough. Prescribed benzonatate PRN.  02/18/20 Ofilia Neas, LPN: Patient presented for Medicare annual wellness exam. Referral sent to community care coordination.  01/27/20 Dorothyann Peng, NP: Patient presented for diabetes follow up. Patient received influenza immunization. A1c increased to 6.6%  continued Jardiance. Follow up in 3 months.  10/29/19 Dorothyann Peng, NP: Patient presented for diabetes follow up. A1c decreased to 6.2%. D/c'd Lantus. Follow up in 3 months.  Consult Visit: 06/11/20 [name], MD (cardiology): Patient presented for CHF and CAD follow up.   05/26/20 Lamar Blinks, MD (fam med): Patient presented for acute telephone visit for acute bronchitis with COPD. Prescribed doxycycline BID.  05/17/20 Tommye Standard, PA-C (cardiology): Patient presented for CHF and CAD follow up. Restarted Ranexa 500 mg daily.   03/17/20 Daylene Katayama, DPM (podiatry): Patient presented for debridement of toenails and diabetic foot exam. Follow up in 3 months.   03/15/20 Glori Bickers, MD (cardiology): Patient presented to HF clinic for follow up. Continue Lasix M/W/F , Toprol, Bidil and Entresto. Follow up in 1 year.    02/27/20 [name], MD (cardiology): Patient presented for routine electrophysiology follow up. Patient reports doing very well and is very active. D/C'd Ranexa and plan to follow him closely on sotalol. Follow up in 6 months.  11/26/19 Daylene Katayama, DPM (podiatry): Patient presented for debridement of toenails and diabetic foot exam. Follow up in 3 months.   08/11/2019- Cardiology- [name], MD- Patient presented for office visit for routine electrophysiology follow up. Patient presented to be stable. No changes in medication regimen. Follow up in 6 months.   07/09/2019- Cardiology- Glori Bickers, MD- Patient presented for office visit for follow up. Patient presented to be stable. No changes made. Patient to discuss with PCP in regards to [place]. If started, Lasix to be discontinued.   Medications: Outpatient Encounter Medications as of 06/22/2020  Medication Sig  . albuterol (VENTOLIN HFA) 108 (90 Base) MCG/ACT inhaler Inhale 1-2 puffs into the lungs every 4 (four) hours as needed for wheezing or shortness of breath (or cough).  . benzonatate (TESSALON PERLES) 100 MG capsule Take 1 capsule (100 mg total) by mouth 3 (three) times daily as needed.  Marland Kitchen BIDIL 20-37.5 MG tablet TAKE 2 TABLETS BY MOUTH 2 (TWO) TIMES DAILY.  Marland Kitchen Blood Glucose Monitoring Suppl (ONETOUCH VERIO FLEX SYSTEM) w/Device KIT USE TO TEST BLOOD GLUCOSE THREE TIMES DAILY  . Blood Pressure Monitoring (BLOOD PRESSURE MONITOR/M CUFF) MISC Upper arm blood pressure monitor  . doxycycline (VIBRAMYCIN) 100 MG capsule Take 1 capsule (100 mg total) by mouth 2 (two) times daily.  Marland Kitchen doxycycline (VIBRAMYCIN) 100 MG capsule Take 1 capsule (100 mg total) by mouth 2 (two) times daily.  Marland Kitchen EASY COMFORT PEN NEEDLES 31G X 5 MM MISC  USE DAILY AT BEDTIME.  Marland Kitchen ELIQUIS 5 MG TABS tablet TAKE 1 TABLET (5 MG TOTAL) BY MOUTH 2 (TWO) TIMES DAILY.  Marland Kitchen empagliflozin (JARDIANCE) 10 MG TABS tablet Take 1 tablet (10 mg total) by mouth daily before  breakfast.  . ENTRESTO 97-103 MG TAKE 1 TABLET BY MOUTH 2 (TWO) TIMES DAILY.  . fenofibrate 160 MG tablet TAKE 1 TABLET BY MOUTH EVERY EVENING.  . finasteride (PROSCAR) 5 MG tablet TAKE ONE TABLET BY MOUTH ONCE DAILY  . Lancets (ONETOUCH DELICA PLUS [id number]) MISC USE TO TEST BLOOD SUGAR THREE TIMES PER DAY  . Lancets (ONETOUCH ULTRASOFT) lancets Use as instructed  . levocetirizine (XYZAL) 5 MG tablet TAKE 1 TABLET (5 MG TOTAL) BY MOUTH EVERY EVENING.  . memantine (NAMENDA) 10 MG tablet TAKE 1 TABLET (10 MG TOTAL) BY MOUTH 2 (TWO) TIMES DAILY.  . metoprolol succinate (TOPROL-XL) 50 MG 24 hr tablet TAKE 1 TABLET (50 MG TOTAL) BY MOUTH EVERY EVENING. TAKE WITH OR IMMEDIATELY FOLLOWING A MEAL.  . mirabegron ER (MYRBETRIQ) 25 MG TB24 tablet Take 25 mg by mouth daily.   . nitroGLYCERIN (NITROSTAT) 0.4 MG SL tablet Place 1 tablet (0.4 mg total) under the tongue every 5 (five) minutes as needed for chest pain.  Glory Rosebush VERIO test strip USE TO TEST BLOOD GLUCOSE THREE TIMES A DAY MEDICALLY NECESSARY  . ranolazine (RANEXA) 500 MG 12 hr tablet Take 1 tablet (500 mg total) by mouth daily.  . rosuvastatin (CRESTOR) 40 MG tablet TAKE 1 TABLET (40 MG TOTAL) BY MOUTH EVERY EVENING.  . sotalol (BETAPACE) 80 MG tablet TAKE 1 TABLET (80 MG TOTAL) BY MOUTH DAILY.  . tamsulosin (FLOMAX) 0.4 MG CAPS capsule Take 0.4 mg by mouth at bedtime.   . Tiotropium Bromide Monohydrate (SPIRIVA RESPIMAT) 2.5 MCG/ACT AERS Inhale 2 puffs into the lungs daily.  Marland Kitchen VIMPAT 100 MG TABS TAKE ONE TABLET BY MOUTH TWICE DAILY   No facility-administered encounter medications on file as of 06/22/2020.   Patient was turned down for Medicaid because he didn't meet the deductible. Patient will have to pay the deductible for the beginning of the year and they refilled all medications prior to the end of the year for 3 months to lessen it.  Patient's insurance will not cover BiDil in 2023 and patient's wife was asking about this. Discussed  switching to generic hydralazine and isosorbide mononitrate due to cost. If patient has trouble with the generics, can consider requesting formulary exception for BiDil.  Patient's wife requested a referral for dermatology. Will send message to PCP.  Current Diagnosis/Assessment:  Goals Addressed            This Visit's Progress   . Pharmacy Care Plan       CARE PLAN ENTRY  Current Barriers:  . Chronic Disease Management support, education, and care coordination needs related to Hypertension, Diabetes, Atrial Fibrillation, Heart Failure, Coronary Artery Disease, COPD, and seizure, and BPH   Hypertension . Pharmacist Clinical Goal(s): o Over the next 90 days, patient will work with PharmD and providers to maintain BP goal < 140/90 . Current regimen:   Metoprolol 73m, 1 tablet once daily  Metoprolol 587m 1 tablet once daily . Interventions: o Discussed diet and exercise and affect on blood pressure . Patient self care activities - Over the next 90 days, patient will: o Check blood pressure weekly, document, and provide at future appointments o Ensure daily salt intake < 2300 mg/day  Hyperlipidemia/ coronary artery disease Lipid Panel  Component Value Date/Time   CHOL 115 08/01/2019 1125   CHOL 137 08/05/2015 1108   TRIG 134.0 08/01/2019 1125   HDL 27.30 (L) 08/01/2019 1125   HDL 32 (L) 08/05/2015 1108   CHOLHDL 4 08/01/2019 1125   VLDL 26.8 08/01/2019 1125   LDLCALC 61 08/01/2019 1125   LDLCALC 86 08/05/2015 1108   LABVLDL 19 08/05/2015 1108   . Pharmacist Clinical Goal(s): o Over the next 90 days, patient will work with PharmD and providers to maintain LDL goal < 70 . Current regimen:   Rosuvastatin 36m, 1 tablet once daily  Fenofibrate 1633m 1 tablet every evening  . Interventions: . We discussed how a diet high in plant sterols (fruits/vegetables/nuts/whole grains/legumes) may reduce your cholesterol.  Encouraged increasing fiber to a daily intake of  10-25g/day  . Patient self care activities - Over the next 90 days, patient will: o Continue to work on lifestyle modifications (exercise/ diet).   Heart Failure . Pharmacist Clinical Goal(s) o Over the next 90 days, patient will continue to monitor weight.  . Current regimen:   Metoprolol 2562m1 tablet once daily  Metoprolol 29m63m tablet once daily   Bidil 20-37.5mg,28mtablet twice times daily  Entresto 97/103mg,58mablet twice daily . Interventions: o We discussed weighing daily; if you gain more than 3 pounds in one day or 5 pounds in one week call your doctor o We discussed alternatives for BiDil if no longer covered by insurance  . Patient self care activities o Patient to continue current medications and daily weights.    Diabetes . Pharmacist Clinical Goal(s): o Over the next 90 days, patient will work with PharmD and providers to maintain A1c goal < 7.0 . Current regimen:   empagliflozin (Jardiance) 10mg, 18mblet once daily before breakfast  . Interventions: o We discussed eating protein instead of fruit before bedtime o Discussed the importance of checking blood sugars regularly and how elevated blood sugars can impact vision . Patient self care activities - Over the next 90 days, patient will: o Check blood sugar at least twice weekly, document, and provide at future appointments o Contact provider with any episodes of hypoglycemia  Atrial fibrillation . Current regimen:  o Sotalol 80mg, 116mlet once daily o Eliquis 5mg, 1 t27met twice daily  . Interventions: o We discussed:  monitoring for signs and symptoms for bleeding (coughing up blood, prolonged nose bleeds, black, tarry stools). . Patient self care activities o Patient will continue current medications.   COPD . Pharmacist Clinical Goal(s) o Over the next 90 days, Prevent worsening of shortness of breath and hospitalizations. . Current regimen:   Albuterol HFA 108mcg/act21maler, 1 to 2 puffs  every four hours as needed for wheezing or shortness of breath  Spiriva Respimat 2.5mcg/ act 35maler, 2 puffs once daily . Patient self care activities o Patient will continue current medications.   Ventricular tachycardia . Current regimen:   Sotalol 80mg, 1 tab48monce daily . Patient self care activities  o Patient will continue current medications.  Seizure disorder . Current regimen:  o Vimpat 100mg, 1 tabl78mwice daily . Patient self care activities o Patient will continue current medications.  BPH . Current regimen:   Finasteride 5mg, 1 tablet54mce daily  tamsulosin 0.4mg, 1 capsule68mce daily  mirabegron ER 12mg, 1 tablet 78m daily . Patient self care activities o Patient will continue current medications and follow up visits with Dr. Martin Webb and Edrick Ohreasing Myrbetriq  at next visit  Memory Loss . Current regimen:  o memantine 39m, 1 tablet twice daily  . Patient self care activities o Patient will continue current medications and follow up visits with Dr. KEllouise Newer    Medication management . Pharmacist Clinical Goal(s): o Over the next 90 days, patient will work with PharmD and providers to maintain optimal medication adherence . Current pharmacy: [facility] . Interventions o Comprehensive medication review performed. o Continue current medication management strategy . Patient self care activities  o Take medications as prescribed o Report any questions or concerns to PharmD and/or provider(s)  Please see past updates related to this goal by clicking on the "Past Updates" button in the selected goal          AFIB  Patient is currently rhythm controlled.  Patient is currently controlled on the following medications:   Sotalol 874m 1 tablet once daily  Anticoagulation CHA2DS2/VASc: 7   Eliquis 96m34m1 tablet twice daily   We discussed:  monitoring for signs and symptoms for bleeding (coughing up blood, prolonged nose  bleeds, black, tarry stools).   Plan Continue current medications  COPD    Last spirometry score: FVC: 100%, FEV1: 95%, FEV1/FVC: 94%    Gold Grade: Gold 1 (FEV1>80%)  Eosinophil count:   Lab Results  Component Value Date/Time   EOSPCT 2.2 08/01/2019 11:25 AM  %                               Eos (Absolute):  Lab Results  Component Value Date/Time   EOSABS 0.1 08/01/2019 11:25 AM   EOSABS 0.1 12/25/2016 12:11 PM    Tobacco Status:  Social History   Tobacco Use  Smoking Status Former Smoker  . Packs/day: 2.00  . Years: 23.00  . Pack years: 46.00  . Types: Cigarettes  . Start date: 08/21/1961  . Quit date: 01/03/1985  . Years since quitting: 35.4  Smokeless Tobacco Never Used   Patient has failed these meds in past: Symbicort   Patient is currently controlled on the following medications:   Albuterol HFA 108m39mct inhaler, 1 to 2 puffs every four hours as needed for wheezing or shortness of breath  Spiriva Respimat 2.96mcg56mct inhaler, 2 puffs once daily  Using maintenance inhaler regularly? Yes Frequency of rescue inhaler use:  prn  We discussed:  proper inhaler technique  -Patient realized he cannot work on cars anymore and some of that is due to SOB  Plan Continue current medications  Diabetes  A1c < 7  Recent Relevant Labs: Lab Results  Component Value Date/Time   HGBA1C 6.9 (A) 04/27/2020 10:18 AM   HGBA1C 6.6 (A) 01/27/2020 09:32 AM   HGBA1C 6.2 10/29/2019 08:12 AM   HGBA1C 7.1 (H) 08/01/2019 11:25 AM   HGBA1C 7.5 (H) 04/15/2019 01:52 PM   HGBA1C 7.8 01/04/2018 02:18 PM   MICROALBUR <0.7 08/01/2019 11:25 AM    Checking BG:  never    Recent BG Readings: (fasting) 180 (this morning)  Patient is currently controlled on the following medications:   empagliflozin (Jardiance) 10mg,82mablet once daily before breakfast  Last diabetic Eye exam: No results found for: HMDIABEYEEXA  - last eye exam was January (everything was normal)  Last  diabetic Foot exam: No results found for: HMDIABFOOTEX  - goes to Dr. Evans Amalia Haileyoot exams   We discussed: diet and exercise extensively.  -Recommended checking blood sugars at least  twice weekly -Patient is eating a lot of fruit and eats a snack at bedtime  -Patient saw ophthalmology in January and got a new rx for glasses -Discussed how elevated BGs can affect vision  Plan Check blood sugars at home a couple times a week. Follow up with CPA in 1 month for BG assessment.  Continue current medications   Heart Failure  Type: Systolic  Last ejection fraction: 25 to 30% (07/21/2019)  NYHA Class: II (slight limitation of activity) AHA HF Stage: B (Heart disease present - no symptoms present)  Patient has failed these meds in past: spironolactone (gynecomastia)  Patient is currently controlled on the following medications:   Metoprolol 56m, 1 tablet once daily  Metoprolol 548m 1 tablet once daily   Bidil 20-37.81m54m2 tablet twice times daily  Entresto 97/103m73m tablet twice daily  We discussed weighing daily; if you gain more than 3 pounds in one day or 5 pounds in one week call your doctor; patient does not check his weight at home  Plan Continue current medications  Hypertension  Denies dizziness, lightheadeness, orthostatic hypotension  BP today is:  <140/90  Office blood pressures are  BP Readings from Last 3 Encounters:  06/11/20 136/74  05/17/20 112/80  04/27/20 (!) 100/50   Patient checks BP at home weekly   Patient home BP readings are ranging: 136/74, 126/74  Patient is controlled on:   Metoprolol 50mg90mtablet once daily   Metoprolol 281mg,57mablet once daily  We discussed diet and exercise extensively  . Discussed diet modifications. DASH diet:  following a diet emphasizing fruits and vegetables and low-fat dairy products along with whole grains, fish, poultry, and nuts. Reducing red meats and sugars.  . Exercising  Plan Continue checking blood  pressure weekly at home. Continue current medications   Hyperlipidemia/ CAD    Lipid Panel     Component Value Date/Time   CHOL 115 08/01/2019 1125   CHOL 137 08/05/2015 1108   TRIG 134.0 08/01/2019 1125   HDL 27.30 (L) 08/01/2019 1125   HDL 32 (L) 08/05/2015 1108   CHOLHDL 4 08/01/2019 1125   VLDL 26.8 08/01/2019 1125   LDLCALC 61 08/01/2019 1125   LDLCALC 86 08/05/2015 1108   LABVLDL 19 08/05/2015 1108     The ASCVD Risk score (Goff DC Jr., et al., 2013) failed to calculate for the following reasons:   The patient has a prior MI or stroke diagnosis   Patient is currently controlled on the following medications:   Rosuvastatin 40mg, 681mblet once daily  Fenofibrate 160mg, 171mlet every evening   We discussed:  diet and exercise extensively.    Plan Continue current medications  Ventricular tachycardia   Patient has failed these meds in past:  Patient is currently controlled on the following medications:   Sotalol 80mg, 1 33met once daily  Ranexa 500 mg daily  We discussed: Ranexa was restarted due to 1 event  Plan Continue current medications.   Seizure disorder  Last episode: last year   Patient has failed these meds in past:  Patient is currently controlled on the following medications:   Vimpat 100mg, 1 t42mt twice daily   Plan Continue current medications  BPH  Patient is currently controlled on the following medications:   Finasteride 81mg, 1 tab74m once daily  tamsulosin 0.4mg, 1 caps88m once daily  mirabegron ER 281mg, 1 tabl47mnce daily  Plan Managed by Martin Webb (Edrick Oht).  Continue current medications  Memory  loss  Patient is currently managed on the following medications:   memantine 70m, 1 tablet twice daily   Plan Managed by Dr. KEllouise Newer(neurologist).  Continue current medications  CKD, stage III   Lab Results  Component Value Date   CREATININE 1.51 (H) 06/11/2020   CREATININE 1.63 (H) 05/17/2020    CREATININE 1.64 (H) 02/27/2020   Estimated Creatinine Clearance: 45 mL/min (A) (by C-G formula based on SCr of 1.51 mg/dL (H)).   Plan Kidney function improved. Continue to monitor and adjust medications as needed.  Managed by nephrologist.   Vaccines   Reviewed and discussed patient's vaccination history.    Immunization History  Administered Date(s) Administered  . Fluad Quad(high Dose 65+) 01/15/2019, 01/27/2020  . Influenza Split 02/22/2011  . Influenza Whole 04/12/2007, 02/24/2008, 02/18/2009, 02/18/2010  . Influenza, High Dose Seasonal PF 02/09/2016, 03/13/2017, 02/08/2018  . Influenza,inj,Quad PF,6+ Mos 03/09/2017  . Influenza-Unspecified 02/06/2012  . Moderna Sars-Covid-2 Vaccination 07/05/2019, 08/02/2019, 04/02/2020  . Pneumococcal Conjugate-13 01/27/2016  . Pneumococcal Polysaccharide-23 02/24/2008, 02/08/2018   Patient received Moderna COVID vaccine on 07/05/19 and 08/02/19. Sent message to CMA to add to chart.  Plan  Recommended patient receive Shingrix vaccine at pharmacy.  Medication Management   Pt uses [facility] medications Uses pill box? Yes Pt endorses 90% compliance  We discussed: Current pharmacy is preferred with insurance plan and patient is satisfied with pharmacy services  Plan  Continue current medication management strategy   Follow up: 3 month phone visit 1 month CPA DM assessment  MJeni Salles PharmD Clinical Pharmacist LOdessaat [phone]

## 2020-06-22 NOTE — Patient Instructions (Addendum)
[place],  As always, it was lovely talking with you! I have attached some information that may be worth looking over. It is some more nutrition tips with diabetes that you may benefit from a refresher and some information about how high blood sugars can impact your vision. Unfortunately, both high blood pressure and high blood sugars can impact your vision so it's important to stay on top of these things.  I will have my assistant call in about a month to get some of those blood sugar readings.  Please give me a call if you have any questions or need anything in the meantime!  Best, Maddie  Jeni Salles, PharmD [facility] Clinical Pharmacist [place] [facility] [place]   Visit Information  Goals Addressed            This Visit's Progress   . Pharmacy Care Plan       CARE PLAN ENTRY  Current Barriers:  . Chronic Disease Management support, education, and care coordination needs related to Hypertension, Diabetes, Atrial Fibrillation, Heart Failure, Coronary Artery Disease, COPD, and seizure, and BPH   Hypertension . Pharmacist Clinical Goal(s): o Over the next 90 days, patient will work with PharmD and providers to maintain BP goal < 140/90 . Current regimen:   Metoprolol 25mg , 1 tablet once daily  Metoprolol 50mg , 1 tablet once daily . Interventions: o Discussed diet and exercise and affect on blood pressure . Patient self care activities - Over the next 90 days, patient will: o Check blood pressure weekly, document, and provide at future appointments o Ensure daily salt intake < 2300 mg/day  Hyperlipidemia/ coronary artery disease Lipid Panel     Component Value Date/Time   CHOL 115 08/01/2019 1125   CHOL 137 08/05/2015 1108   TRIG 134.0 08/01/2019 1125   HDL 27.30 (L) 08/01/2019 1125   HDL 32 (L) 08/05/2015 1108   CHOLHDL 4 08/01/2019 1125   VLDL 26.8 08/01/2019 1125   LDLCALC 61 08/01/2019 1125   LDLCALC 86 08/05/2015 1108    LABVLDL 19 08/05/2015 1108   . Pharmacist Clinical Goal(s): o Over the next 90 days, patient will work with PharmD and providers to maintain LDL goal < 70 . Current regimen:   Rosuvastatin 40mg , 1 tablet once daily  Fenofibrate 160mg , 1 tablet every evening  . Interventions: . We discussed how a diet high in plant sterols (fruits/vegetables/nuts/whole grains/legumes) may reduce your cholesterol.  Encouraged increasing fiber to a daily intake of 10-25g/day  . Patient self care activities - Over the next 90 days, patient will: o Continue to work on lifestyle modifications (exercise/ diet).   Heart Failure . Pharmacist Clinical Goal(s) o Over the next 90 days, patient will continue to monitor weight.  . Current regimen:   Metoprolol 25mg , 1 tablet once daily  Metoprolol 50mg , 1 tablet once daily   Bidil 20-37.5mg , 2 tablet twice times daily  Entresto 97/103mg , 1 tablet twice daily . Interventions: o We discussed weighing daily; if you gain more than 3 pounds in one day or 5 pounds in one week call your doctor o We discussed alternatives for BiDil if no longer covered by insurance  . Patient self care activities o Patient to continue current medications and daily weights.    Diabetes . Pharmacist Clinical Goal(s): o Over the next 90 days, patient will work with PharmD and providers to maintain A1c goal < 7.0 . Current regimen:   empagliflozin (Jardiance) 10mg , 1 tablet once daily before breakfast  .  Interventions: o We discussed eating protein instead of fruit before bedtime o Discussed the importance of checking blood sugars regularly and how elevated blood sugars can impact vision . Patient self care activities - Over the next 90 days, patient will: o Check blood sugar at least twice weekly, document, and provide at future appointments o Contact provider with any episodes of hypoglycemia  Atrial fibrillation . Current regimen:  o Sotalol 80mg , 1 tablet once  daily o Eliquis 5mg , 1 tablet twice daily  . Interventions: o We discussed:  monitoring for signs and symptoms for bleeding (coughing up blood, prolonged nose bleeds, black, tarry stools). . Patient self care activities o Patient will continue current medications.   COPD . Pharmacist Clinical Goal(s) o Over the next 90 days, Prevent worsening of shortness of breath and hospitalizations. . Current regimen:   Albuterol HFA 157mcg/act inhaler, 1 to 2 puffs every four hours as needed for wheezing or shortness of breath  Spiriva Respimat 2.60mcg/ act inhaler, 2 puffs once daily . Patient self care activities o Patient will continue current medications.   Ventricular tachycardia . Current regimen:   Sotalol 80mg , 1 tablet once daily . Patient self care activities  o Patient will continue current medications.  Seizure disorder . Current regimen:  o Vimpat 100mg , 1 tablet twice daily . Patient self care activities o Patient will continue current medications.  BPH . Current regimen:   Finasteride 5mg , 1 tablet once daily  tamsulosin 0.4mg , 1 capsule once daily  mirabegron ER 12mg , 1 tablet once daily . Patient self care activities o Patient will continue current medications and follow up visits with Dr. Edrick Oh and discuss increasing Myrbetriq at next visit  Memory Loss . Current regimen:  o memantine 10mg , 1 tablet twice daily  . Patient self care activities o Patient will continue current medications and follow up visits with Dr. Ellouise Newer.    Medication management . Pharmacist Clinical Goal(s): o Over the next 90 days, patient will work with PharmD and providers to maintain optimal medication adherence . Current pharmacy: [facility] . Interventions o Comprehensive medication review performed. o Continue current medication management strategy . Patient self care activities  o Take medications as prescribed o Report any questions or concerns to PharmD  and/or provider(s)  Please see past updates related to this goal by clicking on the "Past Updates" button in the selected goal         Patient verbalizes understanding of instructions provided today and agrees to view in [place].   Telephone follow up appointment with pharmacy team member scheduled for: 3 months  Viona Gilmore, [facility]  Diabetic Retinopathy  Diabetic retinopathy is a disease of the retina. The retina is a light-sensitive membrane at the back of the eye. Retinopathy is a complication of diabetes and a common cause of bad eyesight. It can eventually cause blindness. Early detection and treatment of diabetic retinopathy is important in keeping your eyes healthy and preventing further damage to them. What are the causes? Diabetic retinopathy is caused by blood sugar (glucose) levels that are too high for a long period of time. Blood glucose levels that are too high for a long time can:  Damage small blood vessels in the retina, allowing blood to leak through the vessel walls.  Cause new, abnormal blood vessels to grow on the retina. This can scar the retina in the advanced stage of diabetic retinopathy. What increases the risk? You are more likely to develop this condition  if:  You have had diabetes for a long time.  You have poorly controlled blood glucose.  You have high blood pressure. What are the signs or symptoms? In the early stages of diabetic retinopathy, there are often no symptoms. As the condition gets worse, symptoms may include:  Blurred vision. This is usually caused by swelling due to abnormal blood glucose levels. The blurriness may go away when blood glucose levels return to normal.  Moving specks or dark spots (floaters) in your vision. These can be caused by a small amount of bleeding (hemorrhage) from retinal blood vessels.  Missing parts of your field of vision, such as vision at the sides of the eyes. This can be caused by larger retinal  hemorrhages.  Difficulty reading.  Double vision.  Pain in one or both eyes.  Feeling pressure in one or both eyes.  Trouble seeing straight lines. Straight lines may not look straight.  Redness of the eyes that does not go away. How is this diagnosed? This condition may be diagnosed with an eye exam. For this exam, your eye specialist puts drops in your eyes that enlarge your pupils. The retina is then checked for changes in its blood vessels. How is this treated? This condition may be treated by:  Keeping your blood glucose and blood pressure within a target range.  Using a type of laser beam to seal your retinal blood vessels. This stops them from bleeding and decreases pressure in your eye.  Getting shots of medicine in the eye to reduce swelling of the center of the retina (macula). You may be given: ? Anti-VEGF medicine. This medicine can help slow vision loss, and may even improve vision. ? Steroid medicine. Follow these instructions at home:  Follow your diabetes management plan as directed by your health care provider. This may include exercising regularly and eating a healthy diet.  Keep your blood glucose level and your blood pressure in your target range. Your health care provider will tell you what your target is.  Check your blood glucose as often as directed.  Take over the counter and prescription medicines only as told by your health care provider. This includes insulin and oral diabetes medicine.  Get your eyes checked at least once every year. An eye specialist can usually see diabetic retinopathy developing long before it starts to cause problems. In many cases, it can be treated to prevent problems from starting in the first place.  Do not use any products that contain nicotine or tobacco, such as cigarettes, e-cigarettes, and chewing tobacco. If you need help quitting, ask your health care provider.  Keep all follow-up visits. This is important. Contact a  health care provider if:  You notice gradual blurring or other changes in your vision over time.  You notice that your glasses or contact lenses do not make things look as sharp as they once did.  You have trouble reading or seeing details at a distance with either eye.  You notice a change in your vision or notice that parts of your field of vision appear missing or hazy.  You suddenly see moving specks or dark spots in the field of vision of either eye. Get help right away if:  You have sudden pain or pressure in one or both eyes.  You suddenly lose vision or a curtain or veil seems to come across your eyes.  You have a sudden burst of floaters in your vision. Summary  Diabetic retinopathy is a disease  of the retina. The retina is a light-sensitive membrane at the back of the eye. Retinopathy is a complication of diabetes.  Get your eyes checked at least once every year. An eye specialist can usually see diabetic retinopathy developing long before it starts to cause problems. In many cases, it can be treated to prevent problems from starting in the first place.  Keep your blood glucose and your blood pressure in your target range. Follow your diabetes management plan as directed by your health care provider.  Get help right away if you have sudden pain or sudden pressure in one or both eyes. Also, get help if you have a sudden burst of floaters in your vision. This information is not intended to replace advice given to you by your health care provider. Make sure you discuss any questions you have with your health care provider. Document Revised: 09/18/2019 Document Reviewed: 09/18/2019 Elsevier Patient Education  2021 [place].  Diabetes Mellitus and Nutrition, Adult When you have diabetes, or diabetes mellitus, it is very important to have healthy eating habits because your blood sugar (glucose) levels are greatly affected by what you eat and drink. Eating healthy foods in the  right amounts, at about the same times every day, can help you:  Control your blood glucose.  Lower your risk of heart disease.  Improve your blood pressure.  Reach or maintain a healthy weight. What can affect my meal plan? Every person with diabetes is different, and each person has different needs for a meal plan. Your health care provider may recommend that you work with a dietitian to make a meal plan that is best for you. Your meal plan may vary depending on factors such as:  The calories you need.  The medicines you take.  Your weight.  Your blood glucose, blood pressure, and cholesterol levels.  Your activity level.  Other health conditions you have, such as heart or kidney disease. How do carbohydrates affect me? Carbohydrates, also called carbs, affect your blood glucose level more than any other type of food. Eating carbs naturally raises the amount of glucose in your blood. Carb counting is a method for keeping track of how many carbs you eat. Counting carbs is important to keep your blood glucose at a healthy level, especially if you use insulin or take certain oral diabetes medicines. It is important to know how many carbs you can safely have in each meal. This is different for every person. Your dietitian can help you calculate how many carbs you should have at each meal and for each snack. How does alcohol affect me? Alcohol can cause a sudden decrease in blood glucose (hypoglycemia), especially if you use insulin or take certain oral diabetes medicines. Hypoglycemia can be a life-threatening condition. Symptoms of hypoglycemia, such as sleepiness, dizziness, and confusion, are similar to symptoms of having too much alcohol.  Do not drink alcohol if: ? Your health care provider tells you not to drink. ? You are pregnant, may be pregnant, or are planning to become pregnant.  If you drink alcohol: ? Do not drink on an empty stomach. ? Limit how much you use to:  0-1  drink a day for women.  0-2 drinks a day for men. ? Be aware of how much alcohol is in your drink. In the [place] one drink equals one 12 oz bottle of beer (355 mL), one 5 oz glass of wine (148 mL), or one 1 oz glass of hard liquor (44 mL). ?  Keep yourself hydrated with water, diet soda, or unsweetened iced tea.  Keep in mind that regular soda, juice, and other mixers may contain a lot of sugar and must be counted as carbs. What are tips for following this plan? Reading food labels  Start by checking the serving size on the "Nutrition Facts" label of packaged foods and drinks. The amount of calories, carbs, fats, and other nutrients listed on the label is based on one serving of the item. Many items contain more than one serving per package.  Check the total grams (g) of carbs in one serving. You can calculate the number of servings of carbs in one serving by dividing the total carbs by 15. For example, if a food has 30 g of total carbs per serving, it would be equal to 2 servings of carbs.  Check the number of grams (g) of saturated fats and trans fats in one serving. Choose foods that have a low amount or none of these fats.  Check the number of milligrams (mg) of salt (sodium) in one serving. Most people should limit total sodium intake to less than 2,300 mg per day.  Always check the nutrition information of foods labeled as "low-fat" or "nonfat." These foods may be higher in added sugar or refined carbs and should be avoided.  Talk to your dietitian to identify your daily goals for nutrients listed on the label. Shopping  Avoid buying canned, pre-made, or processed foods. These foods tend to be high in fat, sodium, and added sugar.  Shop around the outside edge of the grocery store. This is where you will most often find fresh fruits and vegetables, bulk grains, fresh meats, and fresh dairy. Cooking  Use low-heat cooking methods, such as baking, instead of high-heat cooking methods  like deep frying.  Cook using healthy oils, such as olive, canola, or sunflower oil.  Avoid cooking with butter, cream, or high-fat meats. Meal planning  Eat meals and snacks regularly, preferably at the same times every day. Avoid going long periods of time without eating.  Eat foods that are high in fiber, such as fresh fruits, vegetables, beans, and whole grains. Talk with your dietitian about how many servings of carbs you can eat at each meal.  Eat 4-6 oz (112-168 g) of lean protein each day, such as lean meat, chicken, fish, eggs, or tofu. One ounce (oz) of lean protein is equal to: ? 1 oz (28 g) of meat, chicken, or fish. ? 1 egg. ?  cup (62 g) of tofu.  Eat some foods each day that contain healthy fats, such as avocado, nuts, seeds, and fish.   What foods should I eat? Fruits Berries. Apples. Oranges. Peaches. Apricots. Plums. Grapes. Mango. Papaya. Pomegranate. Kiwi. Cherries. Vegetables Lettuce. Spinach. Leafy greens, including kale, chard, collard greens, and mustard greens. Beets. Cauliflower. Cabbage. Broccoli. Carrots. Green beans. Tomatoes. Peppers. Onions. Cucumbers. Brussels sprouts. Grains Whole grains, such as whole-wheat or whole-grain bread, crackers, tortillas, cereal, and pasta. Unsweetened oatmeal. Quinoa. Brown or wild rice. Meats and other proteins Seafood. Poultry without skin. Lean cuts of poultry and beef. Tofu. Nuts. Seeds. Dairy Low-fat or fat-free dairy products such as milk, yogurt, and cheese. The items listed above may not be a complete list of foods and beverages you can eat. Contact a dietitian for more information. What foods should I avoid? Fruits Fruits canned with syrup. Vegetables Canned vegetables. Frozen vegetables with butter or cream sauce. Grains Refined white flour and flour products such  as bread, pasta, snack foods, and cereals. Avoid all processed foods. Meats and other proteins Fatty cuts of meat. Poultry with skin. Breaded or  fried meats. Processed meat. Avoid saturated fats. Dairy Full-fat yogurt, cheese, or milk. Beverages Sweetened drinks, such as soda or iced tea. The items listed above may not be a complete list of foods and beverages you should avoid. Contact a dietitian for more information. Questions to ask a health care provider  Do I need to meet with a diabetes educator?  Do I need to meet with a dietitian?  What number can I call if I have questions?  When are the best times to check my blood glucose? Where to find more information:  American Diabetes Association: diabetes.org  Academy of Nutrition and Dietetics: www.eatright.[employer] of Diabetes and Digestive and Kidney Diseases: [employer]  Association of Diabetes Care and Education Specialists: www.diabeteseducator.org Summary  It is important to have healthy eating habits because your blood sugar (glucose) levels are greatly affected by what you eat and drink.  A healthy meal plan will help you control your blood glucose and maintain a healthy lifestyle.  Your health care provider may recommend that you work with a dietitian to make a meal plan that is best for you.  Keep in mind that carbohydrates (carbs) and alcohol have immediate effects on your blood glucose levels. It is important to count carbs and to use alcohol carefully. This information is not intended to replace advice given to you by your health care provider. Make sure you discuss any questions you have with your health care provider. Document Revised: 04/15/2019 Document Reviewed: 04/15/2019 Elsevier Patient Education  2021 [employer].

## 2020-06-23 ENCOUNTER — Encounter: Payer: Self-pay | Admitting: Podiatry

## 2020-06-23 ENCOUNTER — Encounter: Payer: Self-pay | Admitting: Internal Medicine

## 2020-06-23 ENCOUNTER — Telehealth: Payer: Self-pay

## 2020-06-23 ENCOUNTER — Other Ambulatory Visit: Payer: Self-pay

## 2020-06-23 ENCOUNTER — Ambulatory Visit: Payer: Medicare HMO | Admitting: Podiatry

## 2020-06-23 ENCOUNTER — Ambulatory Visit: Payer: Medicare HMO | Admitting: Internal Medicine

## 2020-06-23 VITALS — BP 112/80 | HR 74 | Temp 97.6°F | Ht 69.0 in | Wt 181.0 lb

## 2020-06-23 DIAGNOSIS — R0609 Other forms of dyspnea: Secondary | ICD-10-CM

## 2020-06-23 DIAGNOSIS — M21621 Bunionette of right foot: Secondary | ICD-10-CM

## 2020-06-23 DIAGNOSIS — M21622 Bunionette of left foot: Secondary | ICD-10-CM

## 2020-06-23 DIAGNOSIS — B351 Tinea unguium: Secondary | ICD-10-CM

## 2020-06-23 DIAGNOSIS — M79676 Pain in unspecified toe(s): Secondary | ICD-10-CM | POA: Diagnosis not present

## 2020-06-23 DIAGNOSIS — M2012 Hallux valgus (acquired), left foot: Secondary | ICD-10-CM

## 2020-06-23 DIAGNOSIS — H5203 Hypermetropia, bilateral: Secondary | ICD-10-CM | POA: Diagnosis not present

## 2020-06-23 DIAGNOSIS — Z87891 Personal history of nicotine dependence: Secondary | ICD-10-CM | POA: Diagnosis not present

## 2020-06-23 DIAGNOSIS — R0600 Dyspnea, unspecified: Secondary | ICD-10-CM

## 2020-06-23 DIAGNOSIS — G4733 Obstructive sleep apnea (adult) (pediatric): Secondary | ICD-10-CM

## 2020-06-23 DIAGNOSIS — M2011 Hallux valgus (acquired), right foot: Secondary | ICD-10-CM

## 2020-06-23 DIAGNOSIS — L84 Corns and callosities: Secondary | ICD-10-CM | POA: Diagnosis not present

## 2020-06-23 DIAGNOSIS — E0843 Diabetes mellitus due to underlying condition with diabetic autonomic (poly)neuropathy: Secondary | ICD-10-CM

## 2020-06-23 DIAGNOSIS — J449 Chronic obstructive pulmonary disease, unspecified: Secondary | ICD-10-CM

## 2020-06-23 DIAGNOSIS — H52209 Unspecified astigmatism, unspecified eye: Secondary | ICD-10-CM | POA: Diagnosis not present

## 2020-06-23 NOTE — Patient Instructions (Addendum)
    ICD-10-CM   1. 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 Stopped smoking with greater than 40 pack year history  Z87.891   3. Dyspnea on exertion  R06.00   4. Obstructive sleep apnea  G47.33 Ambulatory Referral for DME   COPD stable with mild baseline shortness of breath Glad Spiriva is working well for you Glad you are up-to-date with all your vaccines Glad smoking still under remission Too bad that you are having problems with the CPAP machine  -You were last seen by Dr. Gala Murdoch in March 2020  Plan -Continue Spiriva daily with albuterol as needed -Get high-resolution CT chest supine and prone in the next few to several weeks  -We can call you with the results -Make an appointment with DR Carmelina Dane to reestablish care for sleep apnea  Follow-up -We will call you with the CT chest results -Return to see Dr. Chase Caller 9-12 months or sooner if needed follow-up for COPD.

## 2020-06-23 NOTE — Telephone Encounter (Signed)
The patient wife was wanted to know how much battery the patient ICD was and how are we tracking it. I her know he has 5 months to ERI and we do monthly battery checks with his home monitor to make sure he his battery is good.

## 2020-06-23 NOTE — Progress Notes (Signed)
IOV 11/01/2017  , Chief Complaint  Patient presents with  . Follow-up    [name], SOB, or CP.   [name] , 75 y.o. , with dob [birth year] and [name] ,[name] Also has underlyig OSA on CPAP followed by Caleb [name].  He was last seen by [name] the fall 2018 after which [name] left the practice.  He was then most recently followed by [name] our nurse practitioner who ordered a CT scan of the chest.  Patient is here for follow-up.  His only abnormality is isolated reduction diffusion capacity November 2018 64%.  His latest CT scan of the chest June 2019 shows only emphysema without any evidence of ILD.  At this point in time he is on Symbicort and he feels stable.  He is more hampered by his back pain issue and he is stating that he needs to get to physical therapy pretty soon.     IMPRESSION: 1. No evidence of interstitial lung disease. Previously described subtle findings at the lung bases are absent on the prone sequence, compatible with hypoventilatory change. 2. Mild paraseptal emphysema with mild diffuse bronchial wall thickening, suggesting COPD. 3. One vessel coronary atherosclerosis. 4. Stable small pericardial effusion/thickening.  Aortic Atherosclerosis (ICD10-I70.0) and Emphysema (ICD10-J43.9).  xxxxxxxxxxxxxxxxxxxxxxxxxxxxxxxxxxxxxxxxxxxxxxxxx   Chief Complaint  Patient presents with  . Follow-up    Reports no issue with his cpap. States his breathing has been at his baseline. Currenlty using Symbicort daily and Albuterol prn.     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  PMH: Afib, CHF (on entresto), CAD, GERD [name]/ Smoking History: [name], quit 1986, 46 pack years. Maintenance:  Symbicort 160 Pt of: [name], [name] for sleep    75 year old [name] [name] followed in our office for emphysema, interstitial lung disease and severe obstructive sleep apnea.  Patient reports has been doing well since his last office visit.  Patient is maintained on Symbicort 160.  He has not had to use his rescue inhaler.  Patient remains active daily.  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  Patient has been compliant with using his CPAP.  CPAP compliance report confirms this.  See CPAP compliance for listed below:  12/08/2018-01/06/2019- 26 out of last 30 days use, all 26 those days greater than 4 hours, average usage 7 hours and 16 minutes, APAP setting 10-15, AHI 2.8  Patient is followed by [name] for pulmonary.  He is followed by Caleb. Ander [name] for sleep.  MMRC - Breathlessness Score 1 - I get short of breath when hurrying on level ground or walking up a slight hill     Tests:   06/19/2018-pulmonary function test- FVC 3.66 (99% predicted), postbronchodilator ratio 72, postbronchodilator FEV1 2.64 (95% predicted), no significant bronchodilator response, DLCO 70  POLYSOMNOGRAM/CPAP TITRATION 02/11/16: Optimal CPAP pressure 13 cm H2O. No central apneas noted. PVCs with sinus rhythm. Severe periodic leg movements were noted. Lowest saturation 92%. 08/24/05:  Lowest saturation 79% &Baseline saturation 97%. Periodic limb movement index 0. AHI 84  events/hour. Respiratory disturbances not related to sleep stage. Patient slept exclusively in supine position.   Imaging:  HRCT CHEST W/O 01/04/16 :Mild diffuse bronchial wall thickening with mild paraseptal emphysema with some subpleural bleb formation. 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 No pleural effusion or  thickening. Very small pericardial fluid versus pericardial thickening. No pathologic mediastinal adenopathy. No evidence of bronchiectasis or honeycomb changes. No opacities. Thickened esophageal wall.  Labs:   01/27/16 Alpha-1 antitrypsin: MM (132) CRP: 0.1  ESR: 12 ANA: Negative Anti-Jo1:  <0.2 Centromere Ab Screen:  <0.2 Anti-CCP:  <16 DS DNA Ab:  <1 RNP Ab:  0.5 SSA:  <0.2 SSB:  <0.2 SCL-70:  <0.2 Smith Ab:  <0.2 Chromatin Ab:  <0.2 Hypersensitivity Pneumonitis Panel:  Negative   FENO:  No results found for: NITRICOXIDE  PFT: PFT Results Latest Ref Rng & Units 06/19/2018 04/10/2017 11/08/2016 08/08/2016 05/02/2016 12/29/2015 10/20/2015  FVC-Pre L 3.66 3.Caleb 3.79 3.76 3.67 3.52 3.63  FVC-Predicted Pre % 99 95 98 97 95 91 92  FVC-Post L 3.68 3.68 - - - 3.29 3.69  FVC-Predicted Post % 100 96 - - - 85 93  Pre FEV1/FVC % % 75 77 67 77 75 76 77  Post FEV1/FCV % % 72 80 - - - 81 80  FEV1-Pre L 2.75 2.81 2.53 2.89 2.75 2.67 2.81  FEV1-Predicted Pre % 99 97 87 99 94 91 94  FEV1-Post L 2.64 2.93 - - - 2.68 2.94  DLCO UNC% % 70 62 61 57 56 53 71  DLCO COR %Predicted % 85 80 76 73 74 76 98  TLC L - 5.98 - - - 5.99 6.15  TLC % Predicted % - 85 - - - 85 86  RV % Predicted % - 92 - - - 97 97    OV 02/17/2019 - telephone visit - risks, benefits, limitations explained  Subjective:  [name], [name] , [name], [name] , age 37 y.o. , [name] , ADDRESS: [address] [place] [address]   02/17/2019 -   Chief Complaint  Patient presents with  . televisit    pt c/o runny nose, nonprod cough X2 days.  coughing until vomiting. denies fever, mucus production.  requesting hycodan.      HPI [name] 75 y.o. -wife took call. Says due to weather change last weekend. Cough since last night, keeping him awake, rates it as mild. Sputum +. Yellow + . Small amoutn. No loss of taste. No loss of smell. No fever. No hemoptysis. No myalgia. Chest hurts from  coughing. No wheezing. No sick contact. Social distancing at home. Wife says this time he gets sick. Usually hycodan called in the past. Wife works as a Marine scientist but since last year not worked. No clusters. All wearing masks at home.  Not been to restaiurants.. Wife sick 3 weeks ago but not covid tested due to low prop. Not fully compliant with symbicort  COVID tested 02/17/2019 - negative   OV 10/Caleb/2020  Subjective:  [name], [name] , DOB: [name] , age 53 y.o. , MRN: [id number] , ADDRESS: [address] [place] [address]   10/Caleb/2020 -   Chief Complaint  Patient presents with  . Follow-up    Pt last seen in 01/2019 for an acute visit for COPD exacerbation. Pt states he feels back to baseline. Pt states he has an occassional cough with clear mucus, worse in the  morning. Pt denies CP/tightness, f/c/s, worsening SOB.      HPI [name] 75 y.o. -makes another telephone visit for his COPD.  He has mild COPD.  1 month ago he had a flareup.  We treated him with doxycycline and prednisone.  His Covid test was negative.  He tells me he is back to baseline.  He tells me that he is compliant with his Symbicort.  At this point his wife interjected saying he is noncompliant with his Symbicort because it is 2 puff 2 times a day-throat irritation.  He is willing to try an alternative.  He is not smoking anymore.  Review of his vaccination status shows he is up-to-date with his flu and pneumonia vaccines but he can benefit from shingles vaccine.  He takes his CPAP for sleep apnea.  There are no other issues.  For this telephone visit the risks, benefits and limitations of the telephone visit were discussed.  OV 06/23/2020  Subjective:  [name], [name] , DOB: [name] , age 25 y.o. , MRN: [id number] , ADDRESS: [address] [address] PCP [name] Patient Care Team: [name] as PCP - General (Family  Medicine) Bensimhon, Shaune Pascal, MD as PCP - Cardiology (Cardiology) Edrick Oh, MD as Consulting Physician (Nephrology) Cameron Sprang, MD as Consulting Physician (Neurology) Viona Gilmore, [facility] as Pharmacist (Pharmacist)  This Provider for this visit: Treatment Team:  Attending Provider: Brand Males, MD    06/23/2020 -   Chief Complaint  Patient presents with  . Follow-up    Doing well, no new issues     HPI [name] 75 y.o. -follow-up COPD in the setting of previous smoking.  Last seen in October 2020.  Since then he has been on Spiriva.  His voice is hoarse but this is because he has a chronic voice implant done at [facility] following a stroke.  He says there is no vocal cord cancer.  He is followed regularly by ENT at [facility] for his history.  He states that he was working really well for him.  He is compliant with it.  His smoking continues to be in remission.  His last CT scan of the chest was in 2019.  He is willing to have another CT scan of the chest.  His main issue today is that his CPAP machine is noisy.  He remembers seeing [name] but does not remember who he followed up with subsequently.  Review of the records indicate it was Caleb. Jenetta Downer in the office.  There are no other new issues other than mild baseline exertional shortness of breath  CAT Score 06/23/2020  Total CAT Score 4     CT Chest data  No results found.    PFT  PFT Results Latest Ref Rng & Units 06/19/2018 04/10/2017 11/08/2016 08/08/2016 05/02/2016 12/29/2015 10/20/2015  FVC-Pre L 3.66 3.Caleb 3.79 3.76 3.67 3.52 3.63  FVC-Predicted Pre % 99 95 98 97 95 91 92  FVC-Post L 3.68 3.68 - - - 3.29 3.69  FVC-Predicted Post % 100 96 - - - 85 93  Pre FEV1/FVC % % 75 77 67 77 75 76 77  Post FEV1/FCV % % 72 80 - - - 81 80  FEV1-Pre L 2.75 2.81 2.53 2.89 2.75 2.67 2.81  FEV1-Predicted Pre % 99 97 87 99 94 91 94  FEV1-Post L 2.64 2.93 - - - 2.68 2.94  DLCO uncorrected ml/min/mmHg 21.75 20.28 19.96  [name].45 [name].33 17.32 23.59  DLCO UNC% % 70 62 61 57 56 53 71  DLCO corrected ml/min/mmHg 22.00 20.96 20.37 [name].56 19.13 - 24.16  DLCO COR %Predicted % 70 64 63 57 59 - 73  DLVA Predicted % 85 80 76 73 74 76 98  TLC L - 5.98 - - - 5.99 6.15  TLC % Predicted % - 85 - - - 85 86  RV % Predicted % - 92 - - - 97 97       has a past medical history of AICD (automatic cardioverter/defibrillator) present, Atrial fibrillation ([name]), CHF (congestive heart failure) ([name]), Chronic systolic dysfunction of left ventricle, CKD (chronic kidney disease), stage III ([name]) (02/22/2011), COPD (chronic obstructive pulmonary disease) ([place]), Depression, DJD (degenerative joint disease), GERD (gastroesophageal reflux disease), HLD (hyperlipidemia), Hypertension, Ischemic cardiomyopathy, Memory loss, Myocardial infarction ([place]) (1986), OSA on CPAP, Pneumonia (2008), Pneumothorax on left, Seizures ([place]), Status post dilation of esophageal narrowing, Stroke ([place]) (2009), Type 2 diabetes mellitus ([place]), and Ventricular tachycardia ([place]).   reports that he quit smoking about 35 years ago. His smoking use included cigarettes. He started smoking about 58 years ago. He has a 46.00 pack-year smoking history. He has never used smokeless tobacco.  Past Surgical History:  Procedure Laterality Date  . ABDOMINAL HERNIA REPAIR    . CORONARY ANGIOPLASTY  01/03/1985   with pci  to circumflex  . HAND TENDON SURGERY Left ~ 2014   "related to CVA'  . HERNIA REPAIR    . ICD Implantation    . LAPAROSCOPIC APPENDECTOMY N/A 12/24/2017   Procedure: APPENDECTOMY LAPAROSCOPIC;  Surgeon: Fanny Skates, MD;  Location: [facility];  Service: General;  Laterality: N/A;  . RIGHT/LEFT HEART CATH AND CORONARY ANGIOGRAPHY N/A 11/16/2016   Procedure: Right/Left Heart Cath and Coronary Angiography;  Surgeon: Jolaine Artist, MD;  Location: [place] [facility];  Service: Cardiovascular;  Laterality: N/A;  . V TACH ABLATION N/A 12/28/2016   Procedure: Stephanie Coup  Ablation;  Surgeon: [name], MD;  Location: [place] [facility] LAB;  Service: Cardiovascular;  Laterality: N/A;  Stephanie Coup ABLATION N/A 06/04/2018   Procedure: Stephanie Coup ABLATION;  Surgeon: [name], MD;  Location: [place] CV LAB;  Service: Cardiovascular;  Laterality: N/A;  . vocal cord surgery  2011   vocal cord stimulator     Allergies  Allergen Reactions  . Aricept [Donepezil Hcl] Other (See Comments)    Worsens renal function  . Codeine Hives  . Nexium [Esomeprazole] Other (See Comments)    Severely worsens renal function  . Omeprazole Hives  . Pantoprazole Sodium Other (See Comments)    Renal failure  . Tramadol Nausea And Vomiting    Immunization History  Administered Date(s) Administered  . Fluad Quad(high Dose Caleb+) 01/15/2019, 01/27/2020  . Influenza Inj Mdck Quad Pf 01/15/2019, 01/27/2020  . Influenza Split 02/22/2011  . Influenza Whole 04/12/2007, 02/24/2008, 02/18/2009, 02/18/2010  . Influenza, High Dose Seasonal PF 02/09/2016, 03/13/2017, 02/08/2018  . Influenza,inj,Quad PF,6+ Mos 03/09/2017  . Influenza,inj,quad, With Preservative 02/06/2012  . Influenza-Unspecified 02/06/2012  . Moderna Sars-Covid-2 Vaccination 07/05/2019, 08/02/2019, 04/02/2020  . Pneumococcal Conjugate-13 01/27/2016  . Pneumococcal Polysaccharide-23 02/24/2008, 02/08/2018    Family History  Problem Relation Age of Onset  . Heart disease Father   . Hyperlipidemia Father   . Hypertension Father   . Heart attack Father   . Kidney disease Father   . Diabetes Father   . Diabetes Sister   . Heart disease Mother   .  Diabetes Mother   . Emphysema Mother   . Parkinsonism Mother   . COPD Brother   . Diabetes Brother   . COPD Brother   . Heart disease Brother   . Heart disease Brother   . Sudden death Neg Hx      Current Outpatient Medications:  .  albuterol (VENTOLIN HFA) 108 (90 Base) MCG/ACT inhaler, Inhale 1-2 puffs into the lungs every 4 (four) hours as needed for wheezing or  shortness of breath (or cough)., Disp: 6.7 g, Rfl: 1 .  benzonatate (TESSALON PERLES) 100 MG capsule, Take 1 capsule (100 mg total) by mouth 3 (three) times daily as needed., Disp: 20 capsule, Rfl: 0 .  BIDIL 20-37.5 MG tablet, TAKE 2 TABLETS BY MOUTH 2 (TWO) TIMES DAILY., Disp: 360 tablet, Rfl: 2 .  Blood Glucose Monitoring Suppl (ONETOUCH VERIO FLEX SYSTEM) w/Device KIT, USE TO TEST BLOOD GLUCOSE THREE TIMES DAILY, Disp: 1 kit, Rfl: 0 .  Blood Pressure Monitoring (BLOOD PRESSURE MONITOR/M CUFF) MISC, Upper arm blood pressure monitor, Disp: 1 each, Rfl: 0 .  doxycycline (VIBRAMYCIN) 100 MG capsule, Take 1 capsule (100 mg total) by mouth 2 (two) times daily., Disp: 14 capsule, Rfl: 0 .  doxycycline (VIBRAMYCIN) 100 MG capsule, Take 1 capsule (100 mg total) by mouth 2 (two) times daily., Disp: 20 capsule, Rfl: 0 .  EASY COMFORT PEN NEEDLES 31G X 5 MM MISC, USE DAILY AT BEDTIME., Disp: 100 each, Rfl: 3 .  ELIQUIS 5 MG TABS tablet, TAKE 1 TABLET (5 MG TOTAL) BY MOUTH 2 (TWO) TIMES DAILY., Disp: 180 tablet, Rfl: 3 .  empagliflozin (JARDIANCE) 10 MG TABS tablet, Take 1 tablet (10 mg total) by mouth daily before breakfast., Disp: 90 tablet, Rfl: 0 .  ENTRESTO 97-103 MG, TAKE 1 TABLET BY MOUTH 2 (TWO) TIMES DAILY., Disp: 180 tablet, Rfl: 2 .  fenofibrate 160 MG tablet, TAKE 1 TABLET BY MOUTH EVERY EVENING., Disp: 90 tablet, Rfl: 3 .  finasteride (PROSCAR) 5 MG tablet, TAKE ONE TABLET BY MOUTH ONCE DAILY, Disp: 90 tablet, Rfl: 3 .  Lancets (ONETOUCH DELICA PLUS [id number]) MISC, USE TO TEST BLOOD SUGAR THREE TIMES PER DAY, Disp: 300 each, Rfl: 3 .  Lancets (ONETOUCH ULTRASOFT) lancets, Use as instructed, Disp: 100 each, Rfl: 3 .  levocetirizine (XYZAL) 5 MG tablet, TAKE 1 TABLET (5 MG TOTAL) BY MOUTH EVERY EVENING., Disp: 90 tablet, Rfl: 0 .  memantine (NAMENDA) 10 MG tablet, TAKE 1 TABLET (10 MG TOTAL) BY MOUTH 2 (TWO) TIMES DAILY., Disp: 180 tablet, Rfl: 0 .  metoprolol succinate (TOPROL-XL) 50 MG 24 hr  tablet, TAKE 1 TABLET (50 MG TOTAL) BY MOUTH EVERY EVENING. TAKE WITH OR IMMEDIATELY FOLLOWING A MEAL., Disp: 90 tablet, Rfl: 1 .  mirabegron ER (MYRBETRIQ) 25 MG TB24 tablet, Take 25 mg by mouth daily. , Disp: , Rfl:  .  nitroGLYCERIN (NITROSTAT) 0.4 MG SL tablet, Place 1 tablet (0.4 mg total) under the tongue every 5 (five) minutes as needed for chest pain., Disp: 25 tablet, Rfl: 0 .  ONETOUCH VERIO test strip, USE TO TEST BLOOD GLUCOSE THREE TIMES A DAY MEDICALLY NECESSARY, Disp: 300 strip, Rfl: 3 .  ranolazine (RANEXA) 500 MG 12 hr tablet, Take 1 tablet (500 mg total) by mouth daily., Disp: 90 tablet, Rfl: 1 .  rosuvastatin (CRESTOR) 40 MG tablet, TAKE 1 TABLET (40 MG TOTAL) BY MOUTH EVERY EVENING., Disp: 90 tablet, Rfl: 1 .  sotalol (BETAPACE) 80 MG tablet, TAKE 1 TABLET (80 MG  TOTAL) BY MOUTH DAILY., Disp: 90 tablet, Rfl: 1 .  tamsulosin (FLOMAX) 0.4 MG CAPS capsule, Take 0.4 mg by mouth at bedtime. , Disp: , Rfl:  .  Tiotropium Bromide Monohydrate (SPIRIVA RESPIMAT) 2.5 MCG/ACT AERS, Inhale 2 puffs into the lungs daily., Disp: 4 g, Rfl: 2 .  VIMPAT 100 MG TABS, TAKE ONE TABLET BY MOUTH TWICE DAILY, Disp: 180 tablet, Rfl: 3      Objective:   Vitals:   06/23/20 1054  BP: 112/80  Pulse: 74  Temp: 97.6 F (36.4 C)  TempSrc: Oral  SpO2: 97%  Weight: 181 lb (82.1 kg)  Height: '5\' 9"'  (1.753 m)    Estimated body mass index is 26.73 kg/m as calculated from the following:   Height as of this encounter: '5\' 9"'  (1.753 m).   Weight as of this encounter: 181 lb (82.1 kg).  '@WEIGHTCHANGE' @  [employer]   06/23/20 1054  Weight: 181 lb (82.1 kg)     Physical Exam  General: No distress. Looks well Neuro: Alert and Oriented x 3. GCS 15. Speech normal Psych: Pleasant Resp:  Barrel Chest - no.  Wheeze - no, Crackles - no, No overt respiratory distress CVS: Normal heart sounds. Murmurs - no Ext: Stigmata of Connective Tissue Disease - no HEENT: Normal upper airway. PEERL +. No post  nasal drip        Assessment:       ICD-10-CM   1. COPD, mild ([place])  J44.9   2. Stopped smoking with greater than 40 pack year history  Z87.891   3. Dyspnea on exertion  R06.00   4. Obstructive sleep apnea  G47.33 Ambulatory Referral for DME       Plan:     Patient Instructions      ICD-10-CM   1. COPD, mild ([place])  J44.9   2. Stopped smoking with greater than 40 pack year history  Z87.891   3. Dyspnea on exertion  R06.00   4. Obstructive sleep apnea  G47.33 Ambulatory Referral for DME   COPD stable with mild baseline shortness of breath Glad Spiriva is working well for you Glad you are up-to-date with all your vaccines Glad smoking still under remission Too bad that you are having problems with the CPAP machine  -You were last seen by Caleb. Gala Murdoch in March 2020  Plan -Continue Spiriva daily with albuterol as needed -Get high-resolution CT chest supine and prone in the next few to several weeks  -We can call you with the results -Make an appointment with Caleb Carmelina Dane to reestablish care for sleep apnea  Follow-up -We will call you with the CT chest results -Return to see [name] 9-12 months or sooner if needed follow-up for COPD.     SIGNATURE    Caleb. Brand Males, M.D., F.C.C.P,  Pulmonary and Critical Care Medicine Staff Physician, [place] Director - Interstitial Lung Disease  Program  Pulmonary [place] [facility] [place], [place], [address]  Pager: [phone], If no answer or between  15:00h - 7:00h: call [phone] Telephone: [phone]  11:37 AM 06/23/2020

## 2020-06-23 NOTE — Addendum Note (Signed)
Addended byCoralie Keens on: 06/23/2020 11:40 AM   Modules accepted: Orders
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
## 2020-07-05 ENCOUNTER — Other Ambulatory Visit: Payer: Medicare HMO | Admitting: Orthotics

## 2020-07-06 ENCOUNTER — Other Ambulatory Visit (HOSPITAL_COMMUNITY): Payer: Self-pay | Admitting: Internal Medicine

## 2020-07-07 ENCOUNTER — Ambulatory Visit (INDEPENDENT_AMBULATORY_CARE_PROVIDER_SITE_OTHER): Payer: Medicare HMO

## 2020-07-07 DIAGNOSIS — I472 Ventricular tachycardia, unspecified: Secondary | ICD-10-CM

## 2020-07-07 LAB — CUP PACEART REMOTE DEVICE CHECK
Battery Remaining Longevity: 3 mo — CL
Battery Remaining Percentage: 1 %
Brady Statistic RA Percent Paced: 30 %
Brady Statistic RV Percent Paced: 0 %
Date Time Interrogation Session: 20220216042200
HighPow Impedance: 52 Ohm
Implantable Lead Implant Date: 20050922
Implantable Lead Implant Date: 20050922
Implantable Lead Location: 753859
Implantable Lead Location: 753860
Implantable Lead Model: 158
Implantable Lead Model: 5076
Implantable Lead Serial Number: 156891
Implantable Pulse Generator Implant Date: 20100817
Lead Channel Impedance Value: 453 Ohm
Lead Channel Impedance Value: 490 Ohm
Lead Channel Pacing Threshold Amplitude: 0.6 V
Lead Channel Pacing Threshold Amplitude: 1 V
Lead Channel Pacing Threshold Pulse Width: 0.4 ms
Lead Channel Pacing Threshold Pulse Width: 0.4 ms
Lead Channel Setting Pacing Amplitude: 2 V
Lead Channel Setting Pacing Amplitude: 2.4 V
Lead Channel Setting Pacing Pulse Width: 0.4 ms
Lead Channel Setting Sensing Sensitivity: 0.6 mV
Pulse Gen Serial Number: 141895

## 2020-07-14 NOTE — Progress Notes (Signed)
Remote ICD transmission.   

## 2020-07-19 ENCOUNTER — Encounter: Payer: Medicare HMO | Admitting: Internal Medicine

## 2020-07-22 ENCOUNTER — Ambulatory Visit
Admission: RE | Admit: 2020-07-22 | Discharge: 2020-07-22 | Disposition: A | Discharge status: Home / Self Care | Payer: Medicare HMO | Source: Ambulatory Visit | Attending: Internal Medicine | Admitting: Internal Medicine

## 2020-07-22 DIAGNOSIS — I313 Pericardial effusion (noninflammatory): Secondary | ICD-10-CM | POA: Diagnosis not present

## 2020-07-22 DIAGNOSIS — J432 Centrilobular emphysema: Secondary | ICD-10-CM | POA: Diagnosis not present

## 2020-07-22 DIAGNOSIS — J449 Chronic obstructive pulmonary disease, unspecified: Secondary | ICD-10-CM

## 2020-07-22 DIAGNOSIS — I2510 Atherosclerotic heart disease of native coronary artery without angina pectoris: Secondary | ICD-10-CM | POA: Diagnosis not present

## 2020-07-22 DIAGNOSIS — I708 Atherosclerosis of other arteries: Secondary | ICD-10-CM | POA: Diagnosis not present

## 2020-07-22 DIAGNOSIS — R0609 Other forms of dyspnea: Secondary | ICD-10-CM

## 2020-07-22 DIAGNOSIS — R0600 Dyspnea, unspecified: Secondary | ICD-10-CM

## 2020-07-27 ENCOUNTER — Encounter: Payer: Self-pay | Admitting: Pulmonary Disease

## 2020-07-27 ENCOUNTER — Ambulatory Visit (INDEPENDENT_AMBULATORY_CARE_PROVIDER_SITE_OTHER): Payer: Medicare HMO | Admitting: Adult Health

## 2020-07-27 ENCOUNTER — Encounter: Payer: Self-pay | Admitting: Adult Health

## 2020-07-27 ENCOUNTER — Other Ambulatory Visit: Payer: Self-pay

## 2020-07-27 ENCOUNTER — Ambulatory Visit (INDEPENDENT_AMBULATORY_CARE_PROVIDER_SITE_OTHER): Payer: Medicare HMO | Admitting: Pulmonary Disease

## 2020-07-27 VITALS — BP 110/62 | HR 61 | Temp 98.7°F | Ht 69.0 in | Wt 181.0 lb

## 2020-07-27 VITALS — BP 114/72 | HR 60 | Temp 98.1°F | Ht 69.0 in | Wt 180.6 lb

## 2020-07-27 DIAGNOSIS — Z794 Long term (current) use of insulin: Secondary | ICD-10-CM

## 2020-07-27 DIAGNOSIS — Z9989 Dependence on other enabling machines and devices: Secondary | ICD-10-CM | POA: Diagnosis not present

## 2020-07-27 DIAGNOSIS — E118 Type 2 diabetes mellitus with unspecified complications: Secondary | ICD-10-CM | POA: Diagnosis not present

## 2020-07-27 DIAGNOSIS — G4733 Obstructive sleep apnea (adult) (pediatric): Secondary | ICD-10-CM | POA: Diagnosis not present

## 2020-07-27 LAB — POCT GLYCOSYLATED HEMOGLOBIN (HGB A1C): Hemoglobin A1C: 6.6 % — AB (ref 4.0–5.6)

## 2020-07-27 MED ORDER — EMPAGLIFLOZIN 10 MG PO TABS: 10.0000 mg | ORAL_TABLET | Freq: Every day | ORAL | 0 refills | Status: DC
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
## 2020-07-27 NOTE — Patient Instructions (Signed)
Obstructive sleep apnea -Adequately treated with CPAP therapy  Noisy machine -Call your medical supply company to have them take a look at the machine to make sure it is working right  I will follow-up with you in about a year Call with any significant concerns

## 2020-07-27 NOTE — Patient Instructions (Addendum)
It was great seeing you today   Your A1c was 6.6 - you are doing really good with the Jardiance   Please schedule your physical exam in three months

## 2020-07-29 ENCOUNTER — Ambulatory Visit (INDEPENDENT_AMBULATORY_CARE_PROVIDER_SITE_OTHER): Payer: Medicare HMO | Admitting: Podiatry

## 2020-07-29 ENCOUNTER — Other Ambulatory Visit: Payer: Self-pay

## 2020-07-29 DIAGNOSIS — M21622 Bunionette of left foot: Secondary | ICD-10-CM

## 2020-07-29 DIAGNOSIS — E0843 Diabetes mellitus due to underlying condition with diabetic autonomic (poly)neuropathy: Secondary | ICD-10-CM

## 2020-07-29 DIAGNOSIS — M21621 Bunionette of right foot: Secondary | ICD-10-CM

## 2020-07-29 NOTE — Progress Notes (Signed)
Patient presented for foam casting for 3 pair custom diabetic shoe inserts. Patient is measured with a [place] device to be a size 10 wide.  Diabetic shoes are chosen for the safe step catalog. The shoes chosen are 672.  The patient will be contacted when the shoes and inserts are ready to be picked up.

## 2020-07-30 ENCOUNTER — Telehealth: Payer: Self-pay | Admitting: Pharmacist

## 2020-07-30 NOTE — Chronic Care Management (AMB) (Signed)
Chronic Care Management Pharmacy Assistant   Name: [name]  MRN: [id number] DOB: 1945/11/20  Reason for Encounter: Disease State Hypertension Adherence call   Recent office visits:  - 03.08.2022 Caleb Peng, NP Family Medicine  Recent consult visits:  - 03.10.2022 [name], [facility] - 03.05.2022 Caleb Coder, MD Pulmonary Disease - 02.02.2022 Caleb Males, MD Pulmonary Disease - 02.02.2022 [name], [place] visits:  None in previous 6 months  Medications: Outpatient Encounter Medications as of 07/30/2020  Medication Sig   albuterol (VENTOLIN HFA) 108 (90 Base) MCG/ACT inhaler Inhale 1-2 puffs into the lungs every 4 (four) hours as needed for wheezing or shortness of breath (or cough).   benzonatate (TESSALON PERLES) 100 MG capsule Take 1 capsule (100 mg total) by mouth 3 (three) times daily as needed.   BIDIL 20-37.5 MG tablet TAKE 2 TABLETS BY MOUTH 2 (TWO) TIMES DAILY.   Blood Glucose Monitoring Suppl (ONETOUCH VERIO FLEX SYSTEM) w/Device KIT USE TO TEST BLOOD GLUCOSE THREE TIMES DAILY   Blood Pressure Monitoring (BLOOD PRESSURE MONITOR/M CUFF) MISC Upper arm blood pressure monitor   EASY COMFORT PEN NEEDLES 31G X 5 MM MISC USE DAILY AT BEDTIME.   ELIQUIS 5 MG TABS tablet TAKE 1 TABLET (5 MG TOTAL) BY MOUTH 2 (TWO) TIMES DAILY.   empagliflozin (JARDIANCE) 10 MG TABS tablet Take 1 tablet (10 mg total) by mouth daily before breakfast.   ENTRESTO 97-103 MG TAKE 1 TABLET BY MOUTH 2 (TWO) TIMES DAILY.   fenofibrate 160 MG tablet TAKE 1 TABLET BY MOUTH EVERY EVENING.   finasteride (PROSCAR) 5 MG tablet TAKE ONE TABLET BY MOUTH ONCE DAILY   Lancets (ONETOUCH DELICA PLUS [id number]) MISC USE TO TEST BLOOD SUGAR THREE TIMES PER DAY   Lancets (ONETOUCH ULTRASOFT) lancets Use as instructed   levocetirizine (XYZAL) 5 MG tablet TAKE 1 TABLET (5 MG TOTAL) BY MOUTH EVERY EVENING.   memantine (NAMENDA) 10 MG tablet  TAKE 1 TABLET (10 MG TOTAL) BY MOUTH 2 (TWO) TIMES DAILY.   metoprolol succinate (TOPROL-XL) 50 MG 24 hr tablet TAKE 1 TABLET (50 MG TOTAL) BY MOUTH EVERY EVENING. TAKE WITH OR IMMEDIATELY FOLLOWING A MEAL.   mirabegron ER (MYRBETRIQ) 25 MG TB24 tablet Take 25 mg by mouth daily.    nitroGLYCERIN (NITROSTAT) 0.4 MG SL tablet Place 1 tablet (0.4 mg total) under the tongue every 5 (five) minutes as needed for chest pain.   ONETOUCH VERIO test strip USE TO TEST BLOOD GLUCOSE THREE TIMES A DAY MEDICALLY NECESSARY   ranolazine (RANEXA) 500 MG 12 hr tablet Take 1 tablet (500 mg total) by mouth daily.   rosuvastatin (CRESTOR) 40 MG tablet TAKE 1 TABLET (40 MG TOTAL) BY MOUTH EVERY EVENING.   sotalol (BETAPACE) 80 MG tablet TAKE 1 TABLET (80 MG TOTAL) BY MOUTH DAILY.   tamsulosin (FLOMAX) 0.4 MG CAPS capsule Take 0.4 mg by mouth at bedtime.    Tiotropium Bromide Monohydrate (SPIRIVA RESPIMAT) 2.5 MCG/ACT AERS Inhale 2 puffs into the lungs daily.   VIMPAT 100 MG TABS TAKE ONE TABLET BY MOUTH TWICE DAILY   No facility-administered encounter medications on file as of 07/30/2020.   Reviewed chart prior to disease state call. Spoke with patient regarding BP  Recent Office Vitals: BP Readings from Last 3 Encounters:  07/27/20 114/72  07/27/20 110/62  06/23/20 112/80   Pulse Readings from Last 3 Encounters:  07/27/20 60  07/27/20 61  06/23/20 74    Wt  Readings from Last 3 Encounters:  07/27/20 180 lb 9.6 oz (81.9 kg)  07/27/20 181 lb (82.1 kg)  06/23/20 181 lb (82.1 kg)     Kidney Function Lab Results  Component Value Date/Time   CREATININE 1.51 (H) 06/11/2020 10:14 AM   CREATININE 1.63 (H) 05/17/2020 10:13 AM   CREATININE 1.86 (H) 01/26/2016 11:44 AM   CREATININE 1.72 (H) 07/19/2015 03:51 PM   GFR 42.52 (L) 08/01/2019 11:25 AM   GFRNONAA 45 (L) 06/11/2020 10:14 AM   GFRAA 52 (L) 06/11/2020 10:14 AM    BMP Latest Ref Rng & Units 06/11/2020 05/17/2020 02/27/2020  Glucose 65 - 99  mg/dL 132(H) 112(H) 75  BUN 8 - 27 mg/dL _0 Creatinine 0.76 - 1.27 mg/dL 1.51(H) 1.63(H) 1.64(H)  BUN/Creat Ratio 10 - _1 Sodium 134 - 144 mmol/L 139 141 143  Potassium 3.5 - 5.2 mmol/L 4.6 4.5 4.6  Chloride 96 - 106 mmol/L 102 104 106  CO2 20 - 29 mmol/L _2 Calcium 8.6 - 10.2 mg/dL 9.9 10.3(H) 10.1    Current antihypertensive regimen:   Metoprolol 25 mg, 1 tablet once daily  Metoprolol 50 mg, 1 tablet once daily  How often are you checking your Blood Pressure? 3-5x per week  Current home BP readings:  o 03.03 114/72 o 03.07 120/70 o 03.08 110/62 o 03.10 106/68  What recent interventions/DTPs have been made by any provider to improve Blood Pressure control since last CPP Visit: None  Any recent hospitalizations or ED visits since last visit with CPP? No  What diet changes have been made to improve Blood Pressure Control?  o None  What exercise is being done to improve your Blood Pressure Control?  o No Changes  Adherence Review: Is the patient currently on ACE/ARB medication? No Does the patient have >5 day gap between last estimated fill dates? No   I spoke with the patients wife, [name] (HIPPA), and discussed medication adherence. There are no issues currently with his medication. She states that he saw Podiatry yesterday and was fitted for his diabetic shoes. She states that his blood pressure has remained consistent over the past two weeks. No other changes to his medication currently. His wife denies ED visits since his last CPP follow-up. The patient denies any side effects with his medication. Also, denies any problems with his current pharmacy.   Maia Breslow, [place] Assistant [phone]

## 2020-08-09 ENCOUNTER — Other Ambulatory Visit: Payer: Self-pay

## 2020-08-09 ENCOUNTER — Telehealth: Payer: Self-pay

## 2020-08-09 NOTE — Telephone Encounter (Signed)
Patient is scheduled with Dr. Elease Hashimoto 03/22

## 2020-08-09 NOTE — Telephone Encounter (Signed)
Pt needs an appointment with Dr. Elease Hashimoto for his diabetic shoes. Thank you!

## 2020-08-10 ENCOUNTER — Ambulatory Visit (INDEPENDENT_AMBULATORY_CARE_PROVIDER_SITE_OTHER): Payer: Medicare HMO | Admitting: Family Medicine

## 2020-08-10 ENCOUNTER — Encounter: Payer: Self-pay | Admitting: Family Medicine

## 2020-08-10 VITALS — BP 120/60 | HR 63 | Temp 98.2°F | Ht 69.0 in | Wt 178.3 lb

## 2020-08-10 DIAGNOSIS — E1122 Type 2 diabetes mellitus with diabetic chronic kidney disease: Secondary | ICD-10-CM

## 2020-08-10 NOTE — Patient Instructions (Signed)
Diabetes Mellitus and Foot Care Foot care is an important part of your health, especially when you have diabetes. Diabetes may cause you to have problems because of poor blood flow (circulation) to your feet and legs, which can cause your skin to:  Become thinner and drier.  Break more easily.  Heal more slowly.  Peel and crack. You may also have nerve damage (neuropathy) in your legs and feet, causing decreased feeling in them. This means that you may not notice minor injuries to your feet that could lead to more serious problems. Noticing and addressing any potential problems early is the best way to prevent future foot problems. How to care for your feet Foot hygiene  Wash your feet daily with warm water and mild soap. Do not use hot water. Then, pat your feet and the areas between your toes until they are completely dry. Do not soak your feet as this can dry your skin.  Trim your toenails straight across. Do not dig under them or around the cuticle. File the edges of your nails with an emery board or nail file.  Apply a moisturizing lotion or petroleum jelly to the skin on your feet and to dry, brittle toenails. Use lotion that does not contain alcohol and is unscented. Do not apply lotion between your toes.   Shoes and socks  Wear clean socks or stockings every day. Make sure they are not too tight. Do not wear knee-high stockings since they may decrease blood flow to your legs.  Wear shoes that fit properly and have enough cushioning. Always look in your shoes before you put them on to be sure there are no objects inside.  To break in new shoes, wear them for just a few hours a day. This prevents injuries on your feet. Wounds, scrapes, corns, and calluses  Check your feet daily for blisters, cuts, bruises, sores, and redness. If you cannot see the bottom of your feet, use a mirror or ask someone for help.  Do not cut corns or calluses or try to remove them with medicine.  If you  find a minor scrape, cut, or break in the skin on your feet, keep it and the skin around it clean and dry. You may clean these areas with mild soap and water. Do not clean the area with peroxide, alcohol, or iodine.  If you have a wound, scrape, corn, or callus on your foot, look at it several times a day to make sure it is healing and not infected. Check for: ? Redness, swelling, or pain. ? Fluid or blood. ? Warmth. ? Pus or a bad smell.   General tips  Do not cross your legs. This may decrease blood flow to your feet.  Do not use heating pads or hot water bottles on your feet. They may burn your skin. If you have lost feeling in your feet or legs, you may not know this is happening until it is too late.  Protect your feet from hot and cold by wearing shoes, such as at the beach or on hot pavement.  Schedule a complete foot exam at least once a year (annually) or more often if you have foot problems. Report any cuts, sores, or bruises to your health care provider immediately. Where to find more information  American Diabetes Association: www.diabetes.org  Association of Diabetes Care & Education Specialists: www.diabeteseducator.org Contact a health care provider if:  You have a medical condition that increases your risk of infection and   you have any cuts, sores, or bruises on your feet.  You have an injury that is not healing.  You have redness on your legs or feet.  You feel burning or tingling in your legs or feet.  You have pain or cramps in your legs and feet.  Your legs or feet are numb.  Your feet always feel cold.  You have pain around any toenails. Get help right away if:  You have a wound, scrape, corn, or callus on your foot and: ? You have pain, swelling, or redness that gets worse. ? You have fluid or blood coming from the wound, scrape, corn, or callus. ? Your wound, scrape, corn, or callus feels warm to the touch. ? You have pus or a bad smell coming from  the wound, scrape, corn, or callus. ? You have a fever. ? You have a red line going up your leg. Summary  Check your feet every day for blisters, cuts, bruises, sores, and redness.  Apply a moisturizing lotion or petroleum jelly to the skin on your feet and to dry, brittle toenails.  Wear shoes that fit properly and have enough cushioning.  If you have foot problems, report any cuts, sores, or bruises to your health care provider immediately.  Schedule a complete foot exam at least once a year (annually) or more often if you have foot problems. This information is not intended to replace advice given to you by your health care provider. Make sure you discuss any questions you have with your health care provider. Document Revised: 11/27/2019 Document Reviewed: 11/27/2019 Elsevier Patient Education  2021 Elsevier Inc.  
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
## 2020-08-11 ENCOUNTER — Telehealth: Payer: Self-pay | Admitting: Neurology

## 2020-08-11 NOTE — Telephone Encounter (Signed)
Patient's wife called in stating she cannot come to the appointment tomorrow. She wants Dr. Delice Lesch to know he forgets stuff, but then when push comes to shove he will somehow remember. He has also left the stove on.

## 2020-08-12 ENCOUNTER — Telehealth: Payer: Self-pay

## 2020-08-12 ENCOUNTER — Other Ambulatory Visit: Payer: Self-pay

## 2020-08-12 ENCOUNTER — Ambulatory Visit (INDEPENDENT_AMBULATORY_CARE_PROVIDER_SITE_OTHER): Payer: Medicare HMO | Admitting: Neurology

## 2020-08-12 ENCOUNTER — Encounter: Payer: Self-pay | Admitting: Neurology

## 2020-08-12 VITALS — BP 135/85 | HR 62 | Resp 18 | Ht 69.0 in | Wt 179.0 lb

## 2020-08-12 DIAGNOSIS — G40209 Localization-related (focal) (partial) symptomatic epilepsy and epileptic syndromes with complex partial seizures, not intractable, without status epilepticus: Secondary | ICD-10-CM

## 2020-08-12 DIAGNOSIS — F0390 Unspecified dementia without behavioral disturbance: Secondary | ICD-10-CM | POA: Diagnosis not present

## 2020-08-12 DIAGNOSIS — F03A0 Unspecified dementia, mild, without behavioral disturbance, psychotic disturbance, mood disturbance, and anxiety: Secondary | ICD-10-CM

## 2020-08-12 DIAGNOSIS — F32A Depression, unspecified: Secondary | ICD-10-CM | POA: Diagnosis not present

## 2020-08-12 MED ORDER — VIMPAT 100 MG PO TABS: 1.0000 | ORAL_TABLET | Freq: Two times a day (BID) | ORAL | 3 refills | Status: DC

## 2020-08-12 MED ORDER — MEMANTINE HCL 10 MG PO TABS: 10.0000 mg | ORAL_TABLET | Freq: Two times a day (BID) | ORAL | 3 refills | Status: DC

## 2020-08-12 MED ORDER — SERTRALINE HCL 25 MG PO TABS: 25.0000 mg | ORAL_TABLET | Freq: Every day | ORAL | 3 refills | Status: DC
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
## 2020-08-12 NOTE — Telephone Encounter (Signed)
EGM of event reviewed with [place] PA. Patient plan of care to ensure meds taken and follow-up with EP AP or Dr Rayann Heman. Wife of patient notified of plan. Scheduler will contact her to schedule follow-up.

## 2020-08-12 NOTE — Telephone Encounter (Signed)
Attempted phone call to pt.  Left voicemail message to contact device clinic RN at [phone].
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
## 2020-08-18 ENCOUNTER — Other Ambulatory Visit: Payer: Self-pay

## 2020-08-18 ENCOUNTER — Ambulatory Visit (INDEPENDENT_AMBULATORY_CARE_PROVIDER_SITE_OTHER): Payer: Medicare HMO | Admitting: Student

## 2020-08-18 ENCOUNTER — Encounter: Payer: Self-pay | Admitting: Student

## 2020-08-18 ENCOUNTER — Ambulatory Visit (HOSPITAL_COMMUNITY): Payer: Medicare HMO | Attending: Cardiovascular Disease

## 2020-08-18 VITALS — BP 102/60 | HR 55 | Ht 69.0 in | Wt 180.0 lb

## 2020-08-18 DIAGNOSIS — I480 Paroxysmal atrial fibrillation: Secondary | ICD-10-CM | POA: Diagnosis not present

## 2020-08-18 DIAGNOSIS — I5022 Chronic systolic (congestive) heart failure: Secondary | ICD-10-CM

## 2020-08-18 LAB — ECHOCARDIOGRAM COMPLETE
Area-P 1/2: 2.97 cm2
Height: 69 in
MV M vel: 5.46 m/s
MV Peak grad: 119.2 mmHg
S' Lateral: 4.7 cm
Weight: 2880 oz

## 2020-08-18 LAB — CUP PACEART INCLINIC DEVICE CHECK
Date Time Interrogation Session: 20220330142955
HighPow Impedance: 46 Ohm
HighPow Impedance: 57 Ohm
Implantable Lead Implant Date: 20050922
Implantable Lead Implant Date: 20050922
Implantable Lead Location: 753859
Implantable Lead Location: 753860
Implantable Lead Model: 158
Implantable Lead Model: 5076
Implantable Lead Serial Number: 156891
Implantable Pulse Generator Implant Date: 20100817
Lead Channel Impedance Value: 451 Ohm
Lead Channel Impedance Value: 487 Ohm
Lead Channel Pacing Threshold Amplitude: 0.7 V
Lead Channel Pacing Threshold Amplitude: 1 V
Lead Channel Pacing Threshold Pulse Width: 0.4 ms
Lead Channel Pacing Threshold Pulse Width: 0.4 ms
Lead Channel Sensing Intrinsic Amplitude: 19.2 mV
Lead Channel Sensing Intrinsic Amplitude: 2.5 mV
Lead Channel Setting Pacing Amplitude: 2 V
Lead Channel Setting Pacing Amplitude: 2.4 V
Lead Channel Setting Pacing Pulse Width: 0.4 ms
Lead Channel Setting Sensing Sensitivity: 0.6 mV
Pulse Gen Serial Number: 141895

## 2020-08-18 LAB — BASIC METABOLIC PANEL
BUN/Creatinine Ratio: 12 (ref 10–24)
BUN: 25 mg/dL (ref 8–27)
CO2: 24 mmol/L (ref 20–29)
Calcium: 9.9 mg/dL (ref 8.6–10.2)
Chloride: 102 mmol/L (ref 96–106)
Creatinine, Ser: 2.02 mg/dL — ABNORMAL HIGH (ref 0.76–1.27)
Glucose: 96 mg/dL (ref 65–99)
Potassium: 4.6 mmol/L (ref 3.5–5.2)
Sodium: 141 mmol/L (ref 134–144)
eGFR: 34 mL/min/{1.73_m2} — ABNORMAL LOW (ref >59)

## 2020-08-18 LAB — CBC
Hematocrit: 46.1 % (ref 37.5–51.0)
Hemoglobin: 15.5 g/dL (ref 13.0–17.7)
MCH: 30.3 pg (ref 26.6–33.0)
MCHC: 33.6 g/dL (ref 31.5–35.7)
MCV: 90 fL (ref 79–97)
Platelets: 230 10*3/uL (ref 150–450)
RBC: 5.12 x10E6/uL (ref 4.14–5.80)
RDW: 12.7 % (ref 11.6–15.4)
WBC: 7.2 10*3/uL (ref 3.4–10.8)

## 2020-08-18 LAB — MAGNESIUM: Magnesium: 2.2 mg/dL (ref 1.6–2.3)

## 2020-08-18 LAB — TSH: TSH: 2.27 u[IU]/mL (ref 0.450–4.500)

## 2020-08-18 MED ORDER — PERFLUTREN LIPID MICROSPHERE
1.0000 mL | INTRAVENOUS | Status: AC | PRN
  Administered 2020-08-18: 5 mL via INTRAVENOUS
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
## 2020-08-18 NOTE — Patient Instructions (Signed)
Medication Instructions:  Your physician recommends that you continue on your current medications as directed. Please refer to the Current Medication list given to you today.  *If you need a refill on your cardiac medications before your next appointment, please call your pharmacy*   Lab Work: TODAY: BMET, CBC, TSH, MAG  If you have labs (blood work) drawn today and your tests are completely normal, you will receive your results only by: Marland Kitchen MyChart Message (if you have MyChart) OR . A paper copy in the mail If you have any lab test that is abnormal or we need to change your treatment, we will call you to review the results.   Testing/Procedures: Your physician has requested that you have an echocardiogram. Echocardiography is a painless test that uses sound waves to create images of your heart. It provides your doctor with information about the size and shape of your heart and how well your heart's chambers and valves are working. This procedure takes approximately one hour. There are no restrictions for this procedure.  Other Instructions Medication Instructions: Your physician recommends that you continue on your current medications as directed. Please refer to the Current Medication list given to you today.  Labwork: Your physician recommends that you return for lab work on: 09/21/2020 BMET and CBC   Procedures/Testing: Your physician has recommended that you have a Generator Change of your device. This is a procedure that replaces a Pacemaker ICD generator that is at the end of its service life. The remaining lifespan of a pacemaker is determined during visits to the [facility]. The battery in a pacemaker does not stop suddenly but rather loses its charge slowly, which lets the cardiologist plan the replacement date.  Follow-Up: Your physician recommends that you schedule a follow-up appointment in 10 - 14 days from 09/23/2020 with the Device clinic for a wound check  Your physician  recommends that you schedule a follow-up appointment in 3 months from 09/23/2020 with Dr. Caryl Comes.   If you need a refill on your cardiac medications before your next appointment, please call your pharmacy.   -------------------------------------------------------------------------------------------------------------  Please wash with the CHG Soap the night before and morning of procedure (follow instruction page "Preparing For Surgery").   Please report to the [place] Entrance of [facility] at 11:30am ([place], [place] [address])  DO NOT eat or drink anything after midnight the night before procedure  DO NOT [place]  Only take your blood pressure medication the morning of your procedure with enough water to get it down safely.  You will need someone to drive you home after the procedure  ----------------------------------------------------------------------------------------------------------- Due to recent COVID-19 restrictions implemented by our local and state authorities and in an effort to keep both patients and staff as safe as possible, our hospital system requires COVID-19 testing prior to certain scheduled hospital procedures.  Please go to [address] [place], [place] [address] on 09/21/2020 at 10:30am  .  This is a drive up testing site.  You will not need to exit your vehicle.  You will not be billed at the time of testing but may receive a bill later depending on your insurance. You must agree to self-quarantine from the time of your testing until the procedure date on 09/23/2020.  This should included staying home with ONLY the people you live with.  Avoid take-out, grocery store shopping or leaving the house  for any non-emergent reason.  Failure to have your COVID-19 test done on the date and time you have been scheduled will result in cancellation of your procedure.  Please call  our office at [phone] if you have any questions.  ---------------------------------------------------------------------------------------------------------- [facility] - Preparing For Surgery  Before surgery, you can play an important role. Because skin is not sterile, your skin needs to be as free of germs as possible. You can reduce the number of germs on your skin by washing with CHG (chlorahexidine gluconate) Soap before surgery.  CHG is an antiseptic cleaner which kills germs and bonds with the skin to continue killing germs even after washing.   Please do not use if you have an allergy to CHG or antibacterial soaps.  If your skin becomes reddened/irritated stop using the CHG.   Do not shave (including legs and underarms) for at least 48 hours prior to first CHG shower.  It is OK to shave your face.  Please follow these instructions carefully:  1.  Shower the night before surgery and the morning of surgery with CHG.  2.  If you choose to wash your hair, wash your hair first as usual with your normal shampoo.  3.  After you shampoo, rinse your hair and body thoroughly to remove the shampoo.  4.  Use CHG as you would any other liquid soap.  You can apply CHG directly to the skin and wash gently with a clean washcloth. 5.  Apply the CHG Soap to your body ONLY FROM THE NECK DOWN.  Do not use on open wounds or open sores.  Avoid contact with your eyes, ears, mouth and genitals (private parts).  Wash genitals (private parts) with your normal soap.  6.  Wash thoroughly, paying special attention to the area where your surgery will be performed.  7.  Thoroughly rinse your body with warm water from the neck down.   8.  DO NOT shower/wash with your normal soap after using and rinsing off the CHG soap.  9.  Pat yourself dry with a clean towel.           10.  Wear clean pajamas.           11.  Place clean sheets on your bed the night of your first shower and do not sleep with pets.  Day of  Surgery: Do not apply any deodorants/lotions.  Please wear clean clothes to the hospital/surgery center.

## 2020-08-19 ENCOUNTER — Telehealth: Payer: Self-pay | Admitting: Internal Medicine

## 2020-08-19 NOTE — Telephone Encounter (Signed)
w message:      Patient daughter calling to see if this patient still need labs. Please call back.

## 2020-08-19 NOTE — Telephone Encounter (Signed)
Appears like labs were scheduled by mistake at check out not realizing the pre op labs were already scheduled for 5/3.  Lab appointment canceled.

## 2020-08-23 ENCOUNTER — Other Ambulatory Visit (HOSPITAL_COMMUNITY): Payer: Self-pay | Admitting: Internal Medicine

## 2020-08-23 ENCOUNTER — Other Ambulatory Visit: Payer: Self-pay | Admitting: Neurology

## 2020-08-24 ENCOUNTER — Telehealth (HOSPITAL_COMMUNITY): Payer: Self-pay | Admitting: Pharmacy Technician

## 2020-08-24 ENCOUNTER — Encounter (HOSPITAL_COMMUNITY): Payer: Self-pay

## 2020-08-24 ENCOUNTER — Other Ambulatory Visit (HOSPITAL_COMMUNITY): Payer: Self-pay

## 2020-08-24 ENCOUNTER — Other Ambulatory Visit: Payer: Medicare HMO

## 2020-08-24 NOTE — Telephone Encounter (Signed)
Advanced Heart Failure Patient Advocate Encounter  Prior Authorization for BiDil has been approved.    PA# [id number] Effective dates: 08/24/20 through 05/21/21  Patients co-pay is $9.85 (90 days)  Charlann Boxer, CPhT

## 2020-08-24 NOTE — Telephone Encounter (Signed)
Patient Advocate Encounter   Received notification from [facility] that prior authorization for BiDil is required.   PA submitted on CoverMyMeds Key [id number] Status is pending   Will continue to follow.

## 2020-08-25 ENCOUNTER — Other Ambulatory Visit: Payer: Self-pay

## 2020-08-25 ENCOUNTER — Ambulatory Visit (INDEPENDENT_AMBULATORY_CARE_PROVIDER_SITE_OTHER): Payer: Medicare HMO | Admitting: Podiatry

## 2020-08-25 DIAGNOSIS — M2011 Hallux valgus (acquired), right foot: Secondary | ICD-10-CM

## 2020-08-25 DIAGNOSIS — E1140 Type 2 diabetes mellitus with diabetic neuropathy, unspecified: Secondary | ICD-10-CM | POA: Diagnosis not present

## 2020-08-25 DIAGNOSIS — M2012 Hallux valgus (acquired), left foot: Secondary | ICD-10-CM

## 2020-08-25 DIAGNOSIS — E0843 Diabetes mellitus due to underlying condition with diabetic autonomic (poly)neuropathy: Secondary | ICD-10-CM

## 2020-08-25 NOTE — Progress Notes (Signed)
The patient presented to the office to day to pick up diabetic shoes and 3 pair diabetic custom inserts.  1 pair of inserts were put in the shoes and the shoes were fitted to the patient. The patient states they are comfortable and free of defect. He was satisfied with the fit of the shoe. Instructions for break in and wear were dispensed. The patient signed the delivery documentation and break in instruction form.  If any concerns or questions arise, he is instructed to call 

## 2020-08-26 DIAGNOSIS — G4733 Obstructive sleep apnea (adult) (pediatric): Secondary | ICD-10-CM | POA: Diagnosis not present

## 2020-08-30 ENCOUNTER — Ambulatory Visit (INDEPENDENT_AMBULATORY_CARE_PROVIDER_SITE_OTHER): Payer: Medicare HMO

## 2020-08-30 ENCOUNTER — Other Ambulatory Visit: Payer: Self-pay

## 2020-08-30 DIAGNOSIS — I472 Ventricular tachycardia, unspecified: Secondary | ICD-10-CM

## 2020-08-30 DIAGNOSIS — I480 Paroxysmal atrial fibrillation: Secondary | ICD-10-CM

## 2020-08-31 ENCOUNTER — Other Ambulatory Visit: Payer: Medicare HMO

## 2020-08-31 ENCOUNTER — Other Ambulatory Visit: Payer: Self-pay

## 2020-08-31 DIAGNOSIS — I480 Paroxysmal atrial fibrillation: Secondary | ICD-10-CM | POA: Diagnosis not present

## 2020-08-31 LAB — BASIC METABOLIC PANEL
BUN/Creatinine Ratio: 13 (ref 10–24)
BUN: 23 mg/dL (ref 8–27)
CO2: 24 mmol/L (ref 20–29)
Calcium: 9.5 mg/dL (ref 8.6–10.2)
Chloride: 102 mmol/L (ref 96–106)
Creatinine, Ser: 1.73 mg/dL — ABNORMAL HIGH (ref 0.76–1.27)
Glucose: 152 mg/dL — ABNORMAL HIGH (ref 65–99)
Potassium: 4.8 mmol/L (ref 3.5–5.2)
Sodium: 139 mmol/L (ref 134–144)
eGFR: 41 mL/min/{1.73_m2} — ABNORMAL LOW (ref >59)

## 2020-09-01 LAB — CUP PACEART REMOTE DEVICE CHECK
Brady Statistic RA Percent Paced: 38 %
Brady Statistic RV Percent Paced: 0 %
Date Time Interrogation Session: 20220411042200
HighPow Impedance: 52 Ohm
Implantable Lead Implant Date: 20050922
Implantable Lead Implant Date: 20050922
Implantable Lead Location: 753859
Implantable Lead Location: 753860
Implantable Lead Model: 158
Implantable Lead Model: 5076
Implantable Lead Serial Number: 156891
Implantable Pulse Generator Implant Date: 20100817
Lead Channel Impedance Value: 437 Ohm
Lead Channel Impedance Value: 485 Ohm
Lead Channel Pacing Threshold Amplitude: 0.7 V
Lead Channel Pacing Threshold Amplitude: 1 V
Lead Channel Pacing Threshold Pulse Width: 0.4 ms
Lead Channel Pacing Threshold Pulse Width: 0.4 ms
Lead Channel Setting Pacing Amplitude: 2 V
Lead Channel Setting Pacing Amplitude: 2.4 V
Lead Channel Setting Pacing Pulse Width: 0.4 ms
Lead Channel Setting Sensing Sensitivity: 0.6 mV
Pulse Gen Serial Number: 141895

## 2020-09-08 ENCOUNTER — Telehealth: Payer: Self-pay

## 2020-09-08 ENCOUNTER — Encounter: Payer: Self-pay | Admitting: Adult Health

## 2020-09-08 NOTE — Telephone Encounter (Signed)
Patient wife called in and states patient has gotten shocked and is sending a transmission. Amy has taken the call

## 2020-09-08 NOTE — Telephone Encounter (Signed)
Manual transmission received, pt was treated by device with 1 round of ATP followed by 41J shock.  Spoke with pt spouse (DPR on file).  She states they were walking in a store at the time. Pt suddenly went down to knees, stating he felt like device was about to go off.  At time he felt sick to his stomach.  Following shock delivery, pt denies any cardiac symptoms.  Stating he feels good.    Patient medications include Ranexa 500mg  BID, Sotalol 80 mg daily, Metoprolol 50mg .  Pt spouse reports he missed a dose of these medications once evening about a week ago, but has been compliant since.    Reviewed shock plan with spouse.  Pt aware of DMV regulations.  He was last shocked by his device about a month ago.   ERI reached on device 08/18/20, pt is scheduled for [employer] on 09/23/20.

## 2020-09-09 ENCOUNTER — Telehealth: Payer: Self-pay | Admitting: Internal Medicine

## 2020-09-09 NOTE — Telephone Encounter (Signed)
Reviewed Continue current plan Scheduled for generator change when I return.

## 2020-09-09 NOTE — Telephone Encounter (Signed)
Patient's wife states she is returning a call to April, [place].

## 2020-09-09 NOTE — Telephone Encounter (Signed)
Explained to pt's Wife about his Creatinine level. Will recheck on 5/3.

## 2020-09-13 ENCOUNTER — Telehealth: Payer: Self-pay

## 2020-09-13 ENCOUNTER — Encounter: Payer: Medicare HMO | Admitting: Internal Medicine

## 2020-09-13 ENCOUNTER — Other Ambulatory Visit: Payer: Self-pay | Admitting: Physician Assistant

## 2020-09-13 NOTE — Telephone Encounter (Signed)
Pt wife reports she sent manual transmission last night due to pt feeling as though he was shocked yesterday afternoon.    Transmission reviewed.  No shocks or arrhythmias noted for 4/24.  Pt spouse advised.  MD aware of 4/20 treated episode.

## 2020-09-13 NOTE — Telephone Encounter (Signed)
The patient wife states he got shocked yesterday and this is the third time this month he got shocked. The patient did send a transmission yesterday as well.

## 2020-09-14 NOTE — Progress Notes (Signed)
Remote ICD transmission.   

## 2020-09-14 NOTE — Addendum Note (Signed)
Addended by: Cheri Kearns A on: 09/14/2020 08:21 AM   Modules accepted: Level of Service

## 2020-09-20 ENCOUNTER — Telehealth: Payer: Self-pay | Admitting: Pharmacist

## 2020-09-20 ENCOUNTER — Other Ambulatory Visit (HOSPITAL_COMMUNITY): Payer: Self-pay | Admitting: Internal Medicine

## 2020-09-20 ENCOUNTER — Other Ambulatory Visit: Payer: Self-pay | Admitting: Physician Assistant

## 2020-09-20 MED ORDER — LACOSAMIDE 100 MG PO TABS: 1.0000 | ORAL_TABLET | Freq: Two times a day (BID) | ORAL | 3 refills | Status: DC

## 2020-09-20 NOTE — Chronic Care Management (AMB) (Signed)
09/20/2020- Called patient to remind of appointment with Jeni Salles, CPP on 09/21/2020 at 80 am. Wife answered and aware, she will make sure to have patient's medications and supplements near during telephone visit.   Note: Wife mentioned that she is concerned about patients stumbling, weakness and imbalance recently. Patient is under the care of a Cardiologist and is schedule to have his pacemaker changed. He has had several shocks and thinks this could cause him some PTSD. Patient also has arthritis pain that he states effects his bones? And they are not sure what he can take due to all the medications he is already on. Wife did ask about any creams or gels. Patient states he gets "stuck" when he is walking sometimes. She tries to help him continue to be active and do things around the house but he is not able to drive or walk outside or in stores for long. She also had some concerns about Zoloft, he was place on a small dose but that made him sleep all day, they cute dose in half but that worked for a short time so they just stopped taking it. Wife states she will just deal with the anger issues he has but she understands due to his health conditions. Wife aware I will relay this information to Jeni Salles, CPP to review prior to appointment.   Pattricia Boss, [place]

## 2020-09-21 ENCOUNTER — Ambulatory Visit (INDEPENDENT_AMBULATORY_CARE_PROVIDER_SITE_OTHER): Payer: Medicare HMO | Admitting: Pharmacist

## 2020-09-21 ENCOUNTER — Other Ambulatory Visit: Payer: Medicare HMO | Admitting: *Deleted

## 2020-09-21 ENCOUNTER — Other Ambulatory Visit (HOSPITAL_COMMUNITY)
Admission: RE | Admit: 2020-09-21 | Discharge: 2020-09-21 | Disposition: A | Discharge status: Home / Self Care | Payer: Medicare HMO | Source: Ambulatory Visit | Attending: Internal Medicine | Admitting: Internal Medicine

## 2020-09-21 ENCOUNTER — Telehealth: Payer: Self-pay

## 2020-09-21 ENCOUNTER — Other Ambulatory Visit: Payer: Self-pay

## 2020-09-21 DIAGNOSIS — I4891 Unspecified atrial fibrillation: Secondary | ICD-10-CM | POA: Diagnosis not present

## 2020-09-21 DIAGNOSIS — Z20822 Contact with and (suspected) exposure to covid-19: Secondary | ICD-10-CM | POA: Diagnosis not present

## 2020-09-21 DIAGNOSIS — I5022 Chronic systolic (congestive) heart failure: Secondary | ICD-10-CM

## 2020-09-21 DIAGNOSIS — E1122 Type 2 diabetes mellitus with diabetic chronic kidney disease: Secondary | ICD-10-CM | POA: Diagnosis not present

## 2020-09-21 DIAGNOSIS — I480 Paroxysmal atrial fibrillation: Secondary | ICD-10-CM

## 2020-09-21 DIAGNOSIS — Z01812 Encounter for preprocedural laboratory examination: Secondary | ICD-10-CM | POA: Insufficient documentation

## 2020-09-21 DIAGNOSIS — N400 Enlarged prostate without lower urinary tract symptoms: Secondary | ICD-10-CM | POA: Diagnosis not present

## 2020-09-21 LAB — BASIC METABOLIC PANEL
BUN/Creatinine Ratio: 13 (ref 10–24)
BUN: 22 mg/dL (ref 8–27)
CO2: 28 mmol/L (ref 20–29)
Calcium: 9.7 mg/dL (ref 8.6–10.2)
Chloride: 103 mmol/L (ref 96–106)
Creatinine, Ser: 1.72 mg/dL — ABNORMAL HIGH (ref 0.76–1.27)
Glucose: 163 mg/dL — ABNORMAL HIGH (ref 65–99)
Potassium: 4.6 mmol/L (ref 3.5–5.2)
Sodium: 139 mmol/L (ref 134–144)
eGFR: 41 mL/min/{1.73_m2} — ABNORMAL LOW (ref >59)

## 2020-09-21 LAB — CBC
Hematocrit: 42.6 % (ref 37.5–51.0)
Hemoglobin: 14.4 g/dL (ref 13.0–17.7)
MCH: 30.8 pg (ref 26.6–33.0)
MCHC: 33.8 g/dL (ref 31.5–35.7)
MCV: 91 fL (ref 79–97)
Platelets: 239 10*3/uL (ref 150–450)
RBC: 4.68 x10E6/uL (ref 4.14–5.80)
RDW: 13.8 % (ref 11.6–15.4)
WBC: 6.2 10*3/uL (ref 3.4–10.8)

## 2020-09-21 LAB — SARS CORONAVIRUS 2 (TAT 6-24 HRS): SARS Coronavirus 2: NEGATIVE

## 2020-09-21 NOTE — Telephone Encounter (Signed)
Pt called left a voice mail to call back when he or his wife calls back nee to see if the imbalance and drowsiness have improved off sertraline. If yes, we can try a very low dose of fluoxetine 10mg  daily and see if this helps without side effects (some can get dizzy, drowsy). If okay, pls send in Rx,

## 2020-09-21 NOTE — Progress Notes (Signed)
Chronic Care Management Pharmacy Note  09/21/2020 Name:  [name] MRN:  [id number] DOB:  [birth year]  Subjective: [name] is an 75 y.o. year old male who is a primary patient of Nafziger, Tommi Rumps, NP.  The CCM team was consulted for assistance with disease management and care coordination needs.    Engaged with patient by telephone for follow up visit in response to provider referral for pharmacy case management and/or care coordination services.   Consent to Services:  The patient was given information about Chronic Care Management services, agreed to services, and gave verbal consent prior to initiation of services.  Please see initial visit note for detailed documentation.   Patient Care Team: Dorothyann Peng, NP as PCP - General (Family Medicine) Bensimhon, Shaune Pascal, MD as PCP - Cardiology (Cardiology) [name], MD as PCP - Electrophysiology (Cardiology) Edrick Oh, MD as Consulting Physician (Nephrology) Cameron Sprang, MD as Consulting Physician (Neurology) Viona Gilmore, [facility] as Pharmacist (Pharmacist)  Recent office visits: 08/10/20 Carolann Littler, MD: Patient presented for diabetes follow up. Patient required physician visit for diabetic shoes evaluation.   07/27/20 Dorothyann Peng, NP: Patient presented for diabetes follow up. A1c controlled at 6.6.  04/27/20 Dorothyann Peng, NP: Patient presented for diabetes follow up. A1c increased to 6.9%. Referral placed for ENT. Prescribed doxycycline 100 mg BID.  04/22/20 Colin Benton, DO: Patient presented for video visit with cough. Prescribed benzonatate PRN.  06/11/20 [name], MD (cardiology): Patient presented for CHF and CAD follow up.   05/26/20 Lamar Blinks, MD (fam med): Patient presented for acute telephone visit for acute bronchitis with COPD. Prescribed doxycycline BID.  05/17/20 Tommye Standard, PA-C (cardiology): Patient presented for CHF and CAD follow up. Restarted Ranexa 500 mg daily.   Recent  consult visits: 08/25/20 Acquanetta Sit, DPM (podiatry): Patient presented for nail trim.  08/18/20 Joesph July, PA-C (cardiology): Patient presented for HF follow up. Plan for ICD change in May.  08/12/20 Ellouise Newer, MD (neurology): Patient presented for seizure follow up. Prescribed sertraline 25 mg daily for mood.  07/29/20 Acquanetta Sit, DPM (podiatry): Patient presented for diabetic shoe inserts evaluation.  07/27/20 Sherrilyn Rist, MD (pulmonary): Patient presented for sleep apnea evaluation. No changes made.  06/23/20 Brand Males, MD (pulmonary disease): Patient presented for emphysema follow up. No changes made.   Hospital visits: None in previous 6 months  Objective:  Lab Results  Component Value Date   CREATININE 1.73 (H) 08/31/2020   BUN 23 08/31/2020   GFR 42.52 (L) 08/01/2019   GFRNONAA 45 (L) 06/11/2020   GFRAA 52 (L) 06/11/2020   NA 139 08/31/2020   K 4.8 08/31/2020   CALCIUM 9.5 08/31/2020   CO2 24 08/31/2020   GLUCOSE 152 (H) 08/31/2020    Lab Results  Component Value Date/Time   HGBA1C 6.6 (A) 07/27/2020 09:45 AM   HGBA1C 6.9 (A) 04/27/2020 10:18 AM   HGBA1C 6.2 10/29/2019 08:12 AM   HGBA1C 7.1 (H) 08/01/2019 11:25 AM   HGBA1C 7.5 (H) 04/15/2019 01:52 PM   HGBA1C 7.8 01/04/2018 02:18 PM   GFR 42.52 (L) 08/01/2019 11:25 AM   GFR 56.87 (L) 04/15/2019 01:52 PM   MICROALBUR <0.7 08/01/2019 11:25 AM    Last diabetic Eye exam:  Lab Results  Component Value Date/Time   HMDIABEYEEXA No Retinopathy 05/24/2020 07:38 AM    Last diabetic Foot exam: No results found for: HMDIABFOOTEX   Lab Results  Component Value Date   CHOL 115 08/01/2019   HDL 27.30 (L)  08/01/2019   LDLCALC 61 08/01/2019   TRIG 134.0 08/01/2019   CHOLHDL 4 08/01/2019    Hepatic Function Latest Ref Rng & Units 08/13/2019 08/01/2019 07/09/2019  Total Protein 6.5 - 8.1 g/dL 7.5 7.0 7.0  Albumin 3.5 - 5.0 g/dL 4.6 4.3 4.0  AST 15 - 41 U/L '24 22 23  ' ALT 0 - 44 U/L '20 20 25   ' Alk Phosphatase 38 - 126 U/L 38 50 43  Total Bilirubin 0.3 - 1.2 mg/dL 0.7 0.4 0.7  Bilirubin, Direct 0.00 - 0.40 mg/dL - - -    Lab Results  Component Value Date/Time   TSH 2.270 08/18/2020 11:15 AM   TSH 1.28 08/01/2019 11:25 AM   FREET4 0.90 04/20/2011 12:08 PM   FREET4 0.92 04/27/2010 11:39 AM    CBC Latest Ref Rng & Units 08/18/2020 05/17/2020 08/13/2019  WBC 3.4 - 10.8 x10E3/uL 7.2 6.1 6.5  Hemoglobin 13.0 - 17.7 g/dL 15.5 17.0 14.0  Hematocrit 37.5 - 51.0 % 46.1 49.9 43.1  Platelets 150 - 450 x10E3/uL 230 235 258    Lab Results  Component Value Date/Time   VD25OH 41.5 09/06/2015 10:55 AM    Clinical ASCVD: Yes  The ASCVD Risk score (Gerster., et al., 2013) failed to calculate for the following reasons:   The patient has a prior MI or stroke diagnosis    Depression screen [facility] 2/9 02/18/2020 07/18/2018 01/17/2018  Decreased Interest 0 0 0  Down, Depressed, Hopeless 1 0 0  PHQ - 2 Score 1 0 0  Altered sleeping 0 - -  Tired, decreased energy 1 - -  Change in appetite 3 - -  Feeling bad or failure about yourself  3 - -  Trouble concentrating 0 - -  Moving slowly or fidgety/restless 3 - -  Suicidal thoughts 0 - -  PHQ-9 Score 11 - -  Difficult doing work/chores Very difficult - -  Some recent data might be hidden     CAT Score 06/23/2020  Total CAT Score 4    CHA2DS2/VAS Stroke Risk Points  Current as of 29 minutes ago     7 >= 2 Points: High Risk  1 - 1.99 Points: Medium Risk  0 Points: Low Risk    Last Change: N/A      Details    This score determines the patient's risk of having a stroke if the  patient has atrial fibrillation.       Points Metrics  1 Has Congestive Heart Failure:  Yes    Current as of 29 minutes ago  1 Has Vascular Disease:  Yes    Current as of 29 minutes ago  1 Has Hypertension:  Yes    Current as of 29 minutes ago  1 Age:  66    Current as of 29 minutes ago  1 Has Diabetes:  Yes    Current as of 29 minutes ago  2 Had  Stroke:  Yes  Had TIA:  No  Had Thromboembolism:  No    Current as of 29 minutes ago  0 Male:  No    Current as of 29 minutes ago         Social History   Tobacco Use  Smoking Status Former Smoker  . Packs/day: 2.00  . Years: 23.00  . Pack years: 46.00  . Types: Cigarettes  . Start date: 08/21/1961  . Quit date: 01/03/1985  . Years since quitting: 35.7  Smokeless Tobacco Never Used  BP Readings from Last 3 Encounters:  08/18/20 102/60  08/12/20 135/85  08/10/20 120/60   Pulse Readings from Last 3 Encounters:  08/18/20 (!) 55  08/12/20 62  08/10/20 63   Wt Readings from Last 3 Encounters:  08/18/20 180 lb (81.6 kg)  08/12/20 179 lb (81.2 kg)  08/10/20 178 lb 5 oz (80.9 kg)   BMI Readings from Last 3 Encounters:  08/18/20 26.58 kg/m  08/12/20 26.43 kg/m  08/10/20 26.33 kg/m    Assessment/Interventions: Review of patient past medical history, allergies, medications, health status, including review of consultants reports, laboratory and other test data, was performed as part of comprehensive evaluation and provision of chronic care management services.   SDOH:  (Social Determinants of Health) assessments and interventions performed: No  SDOH Screenings   Alcohol Screen: Low Risk   . Last Alcohol Screening Score (AUDIT): 0  Depression (PHQ2-9): Medium Risk  . PHQ-2 Score: 11  Financial Resource Strain: Not on file  Food Insecurity: Food Insecurity Present  . Worried About Charity fundraiser in the Last Year: Sometimes true  . Ran Out of Food in the Last Year: Sometimes true  Housing: Low Risk   . Last Housing Risk Score: 0  Physical Activity: Sufficiently Active  . Days of Exercise per Week: 7 days  . Minutes of Exercise per Session: 30 min  Social Connections: Moderately Integrated  . Frequency of Communication with Friends and Family: More than three times a week  . Frequency of Social Gatherings with Friends and Family: More than three times a week  .  Attends Religious Services: More than 4 times per year  . Active Member of Clubs or Organizations: No  . Attends Archivist Meetings: Never  . Marital Status: Married  Stress: Not on file  Tobacco Use: Medium Risk  . Smoking Tobacco Use: Former Smoker  . Smokeless Tobacco Use: Never Used  Transportation Needs: No Transportation Needs  . Lack of Transportation (Medical): No  . Lack of Transportation (Non-Medical): No    CCM Care Plan  Allergies  Allergen Reactions  . Aricept [Donepezil Hcl] Other (See Comments)    Worsens renal function  . Codeine Hives  . Nexium [Esomeprazole] Other (See Comments)    Severely worsens renal function  . Omeprazole Hives  . Pantoprazole Sodium Other (See Comments)    Renal failure  . Tramadol Nausea And Vomiting    Medications Reviewed Today    Reviewed by Gentry Roch, CPhT (Pharmacy Technician) on 09/14/20 at [facility] List Status: Complete  Medication Order Taking? Sig Documenting Provider Last Dose Status Informant  albuterol (VENTOLIN HFA) 108 (90 Base) MCG/ACT inhaler [id number] Yes Inhale 1-2 puffs into the lungs every 4 (four) hours as needed for wheezing or shortness of breath (or cough). Brand Males, MD  Active Spouse/Significant Other  BIDIL 20-37.5 MG tablet [id number] Yes TAKE 2 TABLETS BY MOUTH 2 (TWO) TIMES DAILY.  Patient taking differently: Take 2 tablets by mouth 2 (two) times daily.   Bensimhon, Shaune Pascal, MD  Active Spouse/Significant Other  Blood Glucose Monitoring Suppl (ONETOUCH VERIO FLEX SYSTEM) w/Device Drucie Opitz [id number]  USE TO TEST BLOOD GLUCOSE THREE TIMES DAILY Nafziger, Tommi Rumps, NP  Active Spouse/Significant Other  Blood Pressure Monitoring (BLOOD PRESSURE MONITOR/M CUFF) MISC [id number]  Upper arm blood pressure monitor Nafziger, Tommi Rumps, NP  Active Spouse/Significant Other  EASY COMFORT PEN NEEDLES 31G X 5 MM MISC [id number]  USE DAILY AT BEDTIME. Dorothyann Peng, NP  Active Spouse/Significant Other  ELIQUIS 5  MG TABS tablet [id number] Yes TAKE 1 TABLET (5 MG TOTAL) BY MOUTH 2 (TWO) TIMES DAILY.  Patient taking differently: Take 5 mg by mouth 2 (two) times daily.   Bensimhon, Shaune Pascal, MD  Active Spouse/Significant Other  empagliflozin (JARDIANCE) 10 MG TABS tablet [id number] Yes Take 1 tablet (10 mg total) by mouth daily before breakfast. Dorothyann Peng, NP  Active Spouse/Significant Other  ENTRESTO 97-103 MG [id number] Yes TAKE 1 TABLET BY MOUTH 2 (TWO) TIMES DAILY.  Patient taking differently: Take 1 tablet by mouth 2 (two) times daily.   Bensimhon, Shaune Pascal, MD  Active Spouse/Significant Other  fenofibrate 160 MG tablet [id number] Yes TAKE 1 TABLET BY MOUTH EVERY EVENING.  Patient taking differently: Take 160 mg by mouth at bedtime.   Bensimhon, Shaune Pascal, MD  Active Spouse/Significant Other  finasteride (PROSCAR) 5 MG tablet [id number] Yes TAKE ONE TABLET BY MOUTH ONCE DAILY  Patient taking differently: Take 5 mg by mouth daily.   Bensimhon, Shaune Pascal, MD  Active Spouse/Significant Other  Lacosamide (VIMPAT) 100 MG TABS [id number] Yes Take 1 tablet (100 mg total) by mouth 2 (two) times daily. Cameron Sprang, MD  Active Spouse/Significant Other  Lancets (ONETOUCH DELICA PLUS [id number]) [place] [id number]  USE TO TEST BLOOD SUGAR THREE TIMES PER DAY Dorothyann Peng, NP  Active Spouse/Significant Other  Lancets (ONETOUCH ULTRASOFT) lancets [id number]  Use as instructed Nafziger, Tommi Rumps, NP  Active Spouse/Significant Other  levocetirizine (XYZAL) 5 MG tablet [id number] Yes TAKE 1 TABLET (5 MG TOTAL) BY MOUTH EVERY EVENING. Brand Males, MD  Active Spouse/Significant Other  memantine (NAMENDA) 10 MG tablet [id number] Yes Take 1 tablet (10 mg total) by mouth 2 (two) times daily. Cameron Sprang, MD  Active Spouse/Significant Other  metoprolol succinate (TOPROL-XL) 25 MG 24 hr tablet [id number] Yes Take 25 mg by mouth every morning. [provider]  Active Spouse/Significant Other  metoprolol succinate  (TOPROL-XL) 50 MG 24 hr tablet [id number] Yes TAKE 1 TABLET (50 MG TOTAL) BY MOUTH EVERY EVENING. TAKE WITH OR IMMEDIATELY FOLLOWING A MEAL.  Patient taking differently: Take 50 mg by mouth at bedtime. TAKE WITH OR IMMEDIATELY FOLLOWING A MEAL.   Bensimhon, Shaune Pascal, MD  Active Spouse/Significant Other  mirabegron ER (MYRBETRIQ) 25 MG TB24 tablet [id number] Yes Take 25 mg by mouth daily.  [provider]  Active Spouse/Significant Other  nitroGLYCERIN (NITROSTAT) 0.4 MG SL tablet [id number] Yes Place 1 tablet (0.4 mg total) under the tongue every 5 (five) minutes as needed for chest pain. Bensimhon, Shaune Pascal, MD  Active Spouse/Significant Other  ONETOUCH VERIO test strip [id number]  USE TO TEST BLOOD GLUCOSE THREE TIMES A DAY MEDICALLY NECESSARY Nafziger, Tommi Rumps, NP  Active Spouse/Significant Other  ranolazine (RANEXA) 500 MG 12 hr tablet [id number] Yes Take 1 tablet (500 mg total) by mouth daily.  Patient taking differently: Take 500 mg by mouth at bedtime.   Baldwin [place], PA-C  Active Spouse/Significant Other  rosuvastatin (CRESTOR) 40 MG tablet [id number] Yes TAKE 1 TABLET (40 MG TOTAL) BY MOUTH EVERY EVENING. Bensimhon, Shaune Pascal, MD  Active Spouse/Significant Other  sertraline (ZOLOFT) 25 MG tablet [id number] Yes Take 1 tablet (25 mg total) by mouth daily.  Patient taking differently: Take 12.5 mg by mouth daily.   Cameron Sprang, MD  Active Spouse/Significant Other  sotalol (BETAPACE) 80 MG tablet [id number] Yes TAKE 1 TABLET (80 MG TOTAL) BY MOUTH DAILY.  Patient taking differently: Take 80 mg by mouth at bedtime.  Bensimhon, Shaune Pascal, MD  Active Spouse/Significant Other  tamsulosin (FLOMAX) 0.4 MG CAPS capsule [id number] Yes Take 0.4 mg by mouth at bedtime.  [provider]  Active Spouse/Significant Other  Tiotropium Bromide Monohydrate (SPIRIVA RESPIMAT) 2.5 MCG/ACT AERS [id number] Yes Inhale 2 puffs into the lungs daily. Brand Males, MD  Active Spouse/Significant Other   Med List Note Marylynn Pearson 12/25/16 1413): CPAP machine with sleep.          Patient Active Problem List   Diagnosis Date Noted  . Memory loss 06/01/2018  . Automatic implantable cardioverter-defibrillator in situ 01/24/2018  . Type 2 diabetes mellitus with diabetic chronic kidney disease ([place]) 01/05/2018  . DCM (dilated cardiomyopathy) ([place])   . COPD (chronic obstructive pulmonary disease) ([place]) 10/01/2017  . Tinnitus 04/13/2017  . Localization-related symptomatic epilepsy and epileptic syndromes with complex partial seizures, not intractable, without status epilepticus ([place]) 07/03/2016  . Precordial chest pain 02/29/2016  . 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[place]) 01/27/2016  . Vitamin D deficiency 09/06/2015  . Ischemic cardiomyopathy 09/06/2015  . BPH (benign prostatic hyperplasia) 08/05/2015  . Stiffness of hand joint 04/06/2014  . NSTEMI (non-ST elevated myocardial infarction) ([place]) 02/03/2014  . Pacemaker 02/03/2014  . Lesion of radial nerve 10/14/2013  . Spinal stenosis of cervical region 10/03/2013  . Ulnar neuropathy at elbow of left upper extremity 10/03/2013  . Seizure disorder ([place]) 03/02/2013  . Esophageal reflux 02/19/2012  . Imbalance 02/22/2011  . CKD (chronic kidney disease), stage III ([place]) 02/22/2011  . Ventricular tachycardia, sustained ([place]) 12/23/2009  . Atrial fibrillation ([place]) 10/23/2007  . Cerebral artery occlusion with cerebral infarction ([place]) 09/06/2007  . Hearing loss 07/29/2007  . Chronic systolic CHF (congestive heart failure) ([place]) 04/12/2007  . Obstructive sleep apnea 04/05/2006  . Mixed hyperlipidemia 04/05/2006  . Coronary artery disease involving native coronary artery of native heart without angina pectoris 04/05/2006    Immunization History  Administered Date(s) Administered  . Fluad Quad(high Dose 65+) 01/15/2019, 01/27/2020  . Influenza Inj Mdck Quad Pf 01/15/2019, 01/27/2020  . Influenza Split 02/22/2011  . Influenza Whole  04/12/2007, 02/24/2008, 02/18/2009, 02/18/2010  . Influenza, High Dose Seasonal PF 02/09/2016, 03/13/2017, 02/08/2018  . Influenza,inj,Quad PF,6+ Mos 03/09/2017  . Influenza,inj,quad, With Preservative 02/06/2012  . Influenza-Unspecified 02/06/2012  . Moderna Sars-Covid-2 Vaccination 07/05/2019, 08/02/2019, 04/02/2020  . Pneumococcal Conjugate-13 01/27/2016  . Pneumococcal Polysaccharide-23 02/24/2008, 02/08/2018   Patient's wife is concerned about patient's hand as his arthritis is severe. He had surgery on one of his hands and probably needs it on the other one and she reports that couldn't stop a fall with his hand even if he did fall. He was using Voltaren gel which stopped working and then a CBD topical but she was worried about it long term.  Discussed trial of capsaicin and referral for PT. Patent could also consider acupuncture. Will send message to PCP for referrals/next steps.  Conditions to be addressed/monitored:  Hypertension, Hyperlipidemia, Diabetes, Atrial Fibrillation, Heart Failure, Coronary Artery Disease, COPD, Osteoarthritis, BPH and Seizures and Irritability  Care Plan : CCM Pharmacy Care Plan  Updates made by Viona Gilmore, [place] since 09/21/2020 12:00 AM    Problem: Problem: Hypertension, Hyperlipidemia, Diabetes, Atrial Fibrillation, Heart Failure, Coronary Artery Disease, COPD, Osteoarthritis, BPH and Seizures and Irritability     Long-Range Goal: Patient-Specific Goal   Start Date: 09/21/2020  Expected End Date: 09/21/2021  This Visit's Progress: On track  Priority: High  Note:   Current Barriers:  . Unable to independently monitor therapeutic  efficacy  Pharmacist Clinical Goal(s):  Marland Kitchen Patient will achieve adherence to monitoring guidelines and medication adherence to achieve therapeutic efficacy . maintain control of diabetes as evidenced by home blood sugar monitoring and A1c  through collaboration with PharmD and provider.   Interventions: . 1:1  collaboration with Dorothyann Peng, NP regarding development and update of comprehensive plan of care as evidenced by provider attestation and co-signature . Inter-disciplinary care team collaboration (see longitudinal plan of care) . Comprehensive medication review performed; medication list updated in electronic medical record  Hypertension (BP goal <140/90) -Controlled -Current treatment:  Metoprolol 1m, 1 tablet once daily   Metoprolol 292m 1 tablet once daily -Medications previously tried: n/a  -Current home readings: 122/70 -Current dietary habits: not doing well lately -Current exercise habits: unable to do much exercise -Denies hypotensive/hypertensive symptoms -Educated on Daily salt intake goal < 2300 mg; Exercise goal of 150 minutes per week; Importance of home blood pressure monitoring; -Counseled to monitor BP at home weekly, document, and provide log at future appointments -Counseled on diet and exercise extensively Recommended to continue current medication  Hyperlipidemia/CAD: (LDL goal < 70) -Controlled -Current treatment:  Rosuvastatin 4065m1 tablet once daily  Fenofibrate 160m78m tablet every evening  -Medications previously tried: n/a  -Current dietary patterns: not great lately -Current exercise habits: unable to do much exercise -Educated on Cholesterol goals;  Exercise goal of 150 minutes per week; -Counseled on diet and exercise extensively Recommended to continue current medication  Ventricular tachycardia (Goal: maintain regular heart rate and rhythm) -Controlled -Current treatment   Sotalol 80mg70mtablet once daily  Ranexa 500 mg daily -Medications previously tried: none  -Recommended to continue current medication   Diabetes (A1c goal <7%) -Controlled -Current medications: . Jardiance 10 mg 1 tablet daily -Medications previously tried: insulin (no longer needed) -Current home glucose readings . fasting glucose: 136  . post  prandial glucose: n/a -Reports hypoglycemic/hyperglycemic symptoms -Current meal patterns:  . breakfast: cereal with milk (8-9 am) . lunch: sometimes skips . dinner: eats at 5 pm . snacks: klondike bars (not sugar free) . drinks: did not discuss -Current exercise: limited -Educated on A1c and blood sugar goals; Benefits of routine self-monitoring of blood sugar; Carbohydrate counting and/or plate method -Counseled to check feet daily and get yearly eye exams -Counseled on diet and exercise extensively Recommended to continue current medication Recommended including lunch every day to avoid excessive late night snacking and choosing protein snacks such as nuts, cheese, or protein shakes   Atrial Fibrillation (Goal: prevent stroke and major bleeding) -Controlled -CHADSVASC: 7 -Current treatment:  Rate control: Sotalol 80mg,12mablet once daily . Anticoagulation: Eliquis 5 mg 1 tablet twice daily -Medications previously tried: n/a -Home BP and HR readings: 122/70  -Counseled on avoidance of NSAIDs due to increased bleeding risk with anticoagulants; -Recommended to continue current medication  Heart Failure (Goal: manage symptoms and prevent exacerbations) -Controlled -Last ejection fraction: 25-30% (Date: 07/21/2019) -HF type: Systolic -NYHA Class: II (slight limitation of activity) -AHA HF Stage: C (Heart disease and symptoms present) -Current treatment:  Metoprolol 25mg, 79mblet once daily  Metoprolol 50mg, 165mlet once daily   Bidil 20-37.5mg, 2 t110met twice times daily  Entresto 97/103mg, 1 t63mt twice daily -Medications previously tried: spironolactone (gynecomastia) -Current home BP/HR readings: 122/70 (in office) -Current dietary habits: not great lately -Current exercise habits: limited -Educated on Importance of weighing daily; if you gain more than 3 pounds in one day or 5 pounds in one  week, call cardiologist Importance of blood pressure control -Recommended  to continue current medication  COPD (Goal: control symptoms and prevent exacerbations) -Controlled -Current treatment   Albuterol HFA 171mg/act inhaler, 1 to 2 puffs every four hours as needed for wheezing or shortness of breath  Spiriva Respimat 2.562m/ act inhaler, 2 puffs once daily -Medications previously tried: Symbicort  -Gold Grade: Gold 1 (FEV1>80%) -Current COPD Classification:  B (high sx, <2 exacerbations/yr) -MMRC/CAT score: 4 -Pulmonary function testing: n/a -Exacerbations requiring treatment in last 6 months: none -Patient reports consistent use of maintenance inhaler -Frequency of rescue inhaler use: as needed -Counseled on Benefits of consistent maintenance inhaler use -Recommended to continue current medication  Anger/irritability (Goal: minimize symptoms) -Uncontrolled -Current treatment: . No medications -Medications previously tried/failed: sertraline (drowsiness/stumbling) -PHQ9: 11 -GAD7: n/a -Educated on Benefits of medication for symptom control -Educated on different side effects with SSRIs and activating nature vs not with certain medications Collaborated with Dr. AqDelice Leschbout switching to an alternative SSRI.  Seizures (Goal: prevent seizures) -Controlled -Current treatment  . Vimpat 10047m1 tablet twice daily  -Medications previously tried: n/a  -Recommended to continue current medication  BPH (Goal: minimize symptoms) -Controlled -Current treatment   Finasteride 5mg100m tablet once daily  tamsulosin 0.4mg,43mcapsule once daily  mirabegron ER 25mg,23mablet once daily -Medications previously tried: n/a  -Recommended to continue current medication  Memory loss (Goal: slow further progression of memory loss) -Controlled -Current treatment   memantine 10mg, 62mblet twice daily  -Medications previously tried: donepezil (worsened renal function)  -Recommended to continue current medication  CKD Stage III (Goal: prevent kidney  worsening) -Controlled -Current treatment  . Jardiance 10 mg 1 tablet daily -Medications previously tried: none  -Recommended to continue current medication   Health Maintenance -Vaccine gaps: shingles, tetanus, COVID booster -Current therapy:  . Levocetirizine 5 mg 1 tablet daily -Educated on Cost vs benefit of each product must be carefully weighed by individual consumer -Patient is satisfied with current therapy and denies issues -Recommended to continue current medication  Patient Goals/Self-Care Activities . Patient will:  - take medications as prescribed check glucose weekly, document, and provide at future appointments check blood pressure weekly, document, and provide at future appointments  Follow Up Plan: Telephone follow up appointment with care management team member scheduled for: 3 months       Medication Assistance: None required.  Patient affirms current coverage meets needs.  [facility]:  [id number] [phone] CenterG[address] [phone] box? Yes Pt endorses 80% compliance  We discussed: Current pharmacy is preferred with insurance plan and patient is satisfied with pharmacy services Patient decided to: Continue current medication management strategy  Care Plan and Follow Up Patient Decision:  Patient agrees to Care Plan and Follow-up.  Plan: Telephone follow up appointment with care management team member scheduled for:  3 months  MadelinJeni SallesD BCACP CHigh Bridgecist LeBauerHermitagessfiDenali [phone]

## 2020-09-21 NOTE — Patient Instructions (Signed)
Hi [name] and [place],  It was great to get to speak with you again! Below is a summary of some of the topics we discussed.   I will reach out once I hear back from [facility] and Dr. Delice Lesch.  Please reach out to me if you have any questions or need anything before our follow up!  Best, Maddie  Jeni Salles, PharmD, [facility] [place]  Visit Information  Goals Addressed   None    Patient Care Plan: CCM Pharmacy Care Plan    Problem Identified: Problem: Hypertension, Hyperlipidemia, Diabetes, Atrial Fibrillation, Heart Failure, Coronary Artery Disease, COPD, Osteoarthritis, BPH and Seizures and Irritability     Long-Range Goal: Patient-Specific Goal   Start Date: 09/21/2020  Expected End Date: 09/21/2021  This Visit's Progress: On track  Priority: High  Note:   Current Barriers:  . Unable to independently monitor therapeutic efficacy  Pharmacist Clinical Goal(s):  Marland Kitchen Patient will achieve adherence to monitoring guidelines and medication adherence to achieve therapeutic efficacy . maintain control of diabetes as evidenced by home blood sugar monitoring and A1c  through collaboration with PharmD and provider.   Interventions: . 1:1 collaboration with Dorothyann Peng, NP regarding development and update of comprehensive plan of care as evidenced by provider attestation and co-signature . Inter-disciplinary care team collaboration (see longitudinal plan of care) . Comprehensive medication review performed; medication list updated in electronic medical record  Hypertension (BP goal <140/90) -Controlled -Current treatment:  Metoprolol 50mg , 1 tablet once daily   Metoprolol 25mg , 1 tablet once daily -Medications previously tried: n/a  -Current home readings: 122/70 -Current dietary habits: not doing well lately -Current exercise habits: unable to do much exercise -Denies hypotensive/hypertensive symptoms -Educated on Daily  salt intake goal < 2300 mg; Exercise goal of 150 minutes per week; Importance of home blood pressure monitoring; -Counseled to monitor BP at home weekly, document, and provide log at future appointments -Counseled on diet and exercise extensively Recommended to continue current medication  Hyperlipidemia/CAD: (LDL goal < 70) -Controlled -Current treatment:  Rosuvastatin 40mg , 1 tablet once daily  Fenofibrate 160mg , 1 tablet every evening  -Medications previously tried: n/a  -Current dietary patterns: not great lately -Current exercise habits: unable to do much exercise -Educated on Cholesterol goals;  Exercise goal of 150 minutes per week; -Counseled on diet and exercise extensively Recommended to continue current medication  Ventricular tachycardia (Goal: maintain regular heart rate and rhythm) -Controlled -Current treatment   Sotalol 80mg , 1 tablet once daily  Ranexa 500 mg daily -Medications previously tried: none  -Recommended to continue current medication   Diabetes (A1c goal <7%) -Controlled -Current medications: . Jardiance 10 mg 1 tablet daily -Medications previously tried: insulin (no longer needed) -Current home glucose readings . fasting glucose: 136  . post prandial glucose: n/a -Reports hypoglycemic/hyperglycemic symptoms -Current meal patterns:  . breakfast: cereal with milk (8-9 am) . lunch: sometimes skips . dinner: eats at 5 pm . snacks: klondike bars (not sugar free) . drinks: did not discuss -Current exercise: limited -Educated on A1c and blood sugar goals; Benefits of routine self-monitoring of blood sugar; Carbohydrate counting and/or plate method -Counseled to check feet daily and get yearly eye exams -Counseled on diet and exercise extensively Recommended to continue current medication Recommended including lunch every day to avoid excessive late night snacking and choosing protein snacks such as nuts, cheese, or protein shakes   Atrial  Fibrillation (Goal: prevent stroke and major bleeding) -Controlled -CHADSVASC: 7 -Current treatment:  Rate control: Sotalol 80mg , 1 tablet once daily . Anticoagulation: Eliquis 5 mg 1 tablet twice daily -Medications previously tried: n/a -Home BP and HR readings: 122/70  -Counseled on avoidance of NSAIDs due to increased bleeding risk with anticoagulants; -Recommended to continue current medication  Heart Failure (Goal: manage symptoms and prevent exacerbations) -Controlled -Last ejection fraction: 25-30% (Date: 07/21/2019) -HF type: Systolic -NYHA Class: II (slight limitation of activity) -AHA HF Stage: C (Heart disease and symptoms present) -Current treatment:  Metoprolol 25mg , 1 tablet once daily  Metoprolol 50mg , 1 tablet once daily   Bidil 20-37.5mg , 2 tablet twice times daily  Entresto 97/103mg , 1 tablet twice daily -Medications previously tried: spironolactone (gynecomastia) -Current home BP/HR readings: 122/70 (in office) -Current dietary habits: not great lately -Current exercise habits: limited -Educated on Importance of weighing daily; if you gain more than 3 pounds in one day or 5 pounds in one week, call cardiologist Importance of blood pressure control -Recommended to continue current medication  COPD (Goal: control symptoms and prevent exacerbations) -Controlled -Current treatment   Albuterol HFA 133mcg/act inhaler, 1 to 2 puffs every four hours as needed for wheezing or shortness of breath  Spiriva Respimat 2.64mcg/ act inhaler, 2 puffs once daily -Medications previously tried: Symbicort  -Gold Grade: Gold 1 (FEV1>80%) -Current COPD Classification:  B (high sx, <2 exacerbations/yr) -MMRC/CAT score: 4 -Pulmonary function testing: n/a -Exacerbations requiring treatment in last 6 months: none -Patient reports consistent use of maintenance inhaler -Frequency of rescue inhaler use: as needed -Counseled on Benefits of consistent maintenance inhaler  use -Recommended to continue current medication  Anger/irritability (Goal: minimize symptoms) -Uncontrolled -Current treatment: . No medications -Medications previously tried/failed: sertraline (drowsiness/stumbling) -PHQ9: 11 -GAD7: n/a -Educated on Benefits of medication for symptom control -Educated on different side effects with SSRIs and activating nature vs not with certain medications Collaborated with Dr. Delice Lesch about switching to an alternative SSRI.  Seizures (Goal: prevent seizures) -Controlled -Current treatment  . Vimpat 100mg , 1 tablet twice daily  -Medications previously tried: n/a  -Recommended to continue current medication  BPH (Goal: minimize symptoms) -Controlled -Current treatment   Finasteride 5mg , 1 tablet once daily  tamsulosin 0.4mg , 1 capsule once daily  mirabegron ER 25mg , 1 tablet once daily -Medications previously tried: n/a  -Recommended to continue current medication  Memory loss (Goal: slow further progression of memory loss) -Controlled -Current treatment   memantine 10mg , 1 tablet twice daily  -Medications previously tried: donepezil (worsened renal function)  -Recommended to continue current medication  CKD Stage III (Goal: prevent kidney worsening) -Controlled -Current treatment  . Jardiance 10 mg 1 tablet daily -Medications previously tried: none  -Recommended to continue current medication   Health Maintenance -Vaccine gaps: shingles, tetanus, COVID booster -Current therapy:  . Levocetirizine 5 mg 1 tablet daily -Educated on Cost vs benefit of each product must be carefully weighed by individual consumer -Patient is satisfied with current therapy and denies issues -Recommended to continue current medication  Patient Goals/Self-Care Activities . Patient will:  - take medications as prescribed check glucose weekly, document, and provide at future appointments check blood pressure weekly, document, and provide at future  appointments  Follow Up Plan: Telephone follow up appointment with care management team member scheduled for: 3 months       Patient verbalizes understanding of instructions provided today and agrees to view in [place].  Telephone follow up appointment with pharmacy team member scheduled for:3 months  Viona Gilmore, [facility]

## 2020-09-21 NOTE — Telephone Encounter (Signed)
-----   Message from Cameron Sprang, MD sent at 09/21/2020  1:13 PM EDT ----- Regarding: FW: SSRI therapy Pls call wife and see if the imbalance and drowsiness have improved off sertraline. If yes, we can try a very low dose of fluoxetine 10mg  daily and see if this helps without side effects (some can get dizzy, drowsy). If okay, pls send in Rx, thanks  ----- Message ----- From: Viona Gilmore, [place]: 09/21/2020   9:37 AM EDT To: Cameron Sprang, MD Subject: SSRI therapy                                   Good morning,  I am a clinical pharmacist working in Mr. Daishon PCP's office and discussed his medications with him and his wife today. He was experiencing imbalance and drowsiness from the sertraline, even with cutting the tablet in half and so he stopped taking it. What do you think about switching to an alternative, more activating SSRI such as fluoxetine? His wife reported it was really helping his mood but she was worried about him falling.  Let me know what you think!  Best, Maddie  Jeni Salles, PharmD [facility]: [place] Rhonda W Clinical Pharmacist [facility] [place]

## 2020-09-22 ENCOUNTER — Other Ambulatory Visit: Payer: Self-pay | Admitting: Adult Health

## 2020-09-22 DIAGNOSIS — M25649 Stiffness of unspecified hand, not elsewhere classified: Secondary | ICD-10-CM

## 2020-09-22 MED ORDER — FLUOXETINE HCL 10 MG PO CAPS: 10.0000 mg | ORAL_CAPSULE | Freq: Every day | ORAL | 3 refills | Status: DC

## 2020-09-22 NOTE — Pre-Procedure Instructions (Signed)
Instructed patient on the following items: Arrival time 1130 Nothing to eat or drink after midnight No meds AM of procedure Responsible person to drive you home and stay with you for 24 hrs Wash with special soap night before and morning of procedure If on anti-coagulant drug instructions Eliquis- hold tonight and tomorrow morning doses.

## 2020-09-22 NOTE — Telephone Encounter (Signed)
Pt is doing better off the sertraline he is back to normal no balance issues no drowsiness and they would like to try the fluoxetine 10mg 

## 2020-09-23 ENCOUNTER — Other Ambulatory Visit: Payer: Self-pay

## 2020-09-23 ENCOUNTER — Encounter (HOSPITAL_COMMUNITY): Payer: Self-pay | Admitting: Internal Medicine

## 2020-09-23 ENCOUNTER — Ambulatory Visit (HOSPITAL_COMMUNITY)
Admission: RE | Admit: 2020-09-23 | Discharge: 2020-09-23 | Disposition: A | Discharge status: Home / Self Care | Payer: Medicare HMO | Source: Ambulatory Visit | Attending: Internal Medicine | Admitting: Internal Medicine

## 2020-09-23 ENCOUNTER — Encounter (HOSPITAL_COMMUNITY)
Admission: RE | Disposition: A | Discharge status: Home / Self Care | Payer: Medicare HMO | Source: Ambulatory Visit | Attending: Internal Medicine

## 2020-09-23 ENCOUNTER — Encounter: Payer: Self-pay | Admitting: Family Medicine

## 2020-09-23 DIAGNOSIS — N183 Chronic kidney disease, stage 3 unspecified: Secondary | ICD-10-CM | POA: Diagnosis not present

## 2020-09-23 DIAGNOSIS — Z79899 Other long term (current) drug therapy: Secondary | ICD-10-CM | POA: Diagnosis not present

## 2020-09-23 DIAGNOSIS — I130 Hypertensive heart and chronic kidney disease with heart failure and stage 1 through stage 4 chronic kidney disease, or unspecified chronic kidney disease: Secondary | ICD-10-CM | POA: Insufficient documentation

## 2020-09-23 DIAGNOSIS — I2510 Atherosclerotic heart disease of native coronary artery without angina pectoris: Secondary | ICD-10-CM | POA: Diagnosis not present

## 2020-09-23 DIAGNOSIS — Z885 Allergy status to narcotic agent status: Secondary | ICD-10-CM | POA: Insufficient documentation

## 2020-09-23 DIAGNOSIS — Z841 Family history of disorders of kidney and ureter: Secondary | ICD-10-CM | POA: Insufficient documentation

## 2020-09-23 DIAGNOSIS — I480 Paroxysmal atrial fibrillation: Secondary | ICD-10-CM | POA: Insufficient documentation

## 2020-09-23 DIAGNOSIS — I252 Old myocardial infarction: Secondary | ICD-10-CM | POA: Insufficient documentation

## 2020-09-23 DIAGNOSIS — I5022 Chronic systolic (congestive) heart failure: Secondary | ICD-10-CM | POA: Diagnosis not present

## 2020-09-23 DIAGNOSIS — Z8669 Personal history of other diseases of the nervous system and sense organs: Secondary | ICD-10-CM | POA: Diagnosis not present

## 2020-09-23 DIAGNOSIS — Z8249 Family history of ischemic heart disease and other diseases of the circulatory system: Secondary | ICD-10-CM | POA: Insufficient documentation

## 2020-09-23 DIAGNOSIS — Z5941 Food insecurity: Secondary | ICD-10-CM | POA: Diagnosis not present

## 2020-09-23 DIAGNOSIS — E785 Hyperlipidemia, unspecified: Secondary | ICD-10-CM | POA: Insufficient documentation

## 2020-09-23 DIAGNOSIS — Z7901 Long term (current) use of anticoagulants: Secondary | ICD-10-CM | POA: Diagnosis not present

## 2020-09-23 DIAGNOSIS — Z888 Allergy status to other drugs, medicaments and biological substances status: Secondary | ICD-10-CM | POA: Diagnosis not present

## 2020-09-23 DIAGNOSIS — Z87891 Personal history of nicotine dependence: Secondary | ICD-10-CM | POA: Insufficient documentation

## 2020-09-23 DIAGNOSIS — I255 Ischemic cardiomyopathy: Secondary | ICD-10-CM | POA: Diagnosis not present

## 2020-09-23 DIAGNOSIS — Z8673 Personal history of transient ischemic attack (TIA), and cerebral infarction without residual deficits: Secondary | ICD-10-CM | POA: Diagnosis not present

## 2020-09-23 DIAGNOSIS — Z820 Family history of epilepsy and other diseases of the nervous system: Secondary | ICD-10-CM | POA: Insufficient documentation

## 2020-09-23 DIAGNOSIS — I4892 Unspecified atrial flutter: Secondary | ICD-10-CM | POA: Insufficient documentation

## 2020-09-23 DIAGNOSIS — Z4502 Encounter for adjustment and management of automatic implantable cardiac defibrillator: Secondary | ICD-10-CM | POA: Insufficient documentation

## 2020-09-23 DIAGNOSIS — I472 Ventricular tachycardia: Secondary | ICD-10-CM | POA: Insufficient documentation

## 2020-09-23 HISTORY — PX: ICD GENERATOR CHANGEOUT: EP1231

## 2020-09-23 LAB — GLUCOSE, CAPILLARY: Glucose-Capillary: 102 mg/dL — ABNORMAL HIGH (ref 70–99)

## 2020-09-23 SURGERY — ICD GENERATOR CHANGEOUT

## 2020-09-23 MED ORDER — FENTANYL CITRATE (PF) 100 MCG/2ML IJ SOLN
INTRAMUSCULAR | Status: DC | PRN
  Administered 2020-09-23: 25 ug via INTRAVENOUS

## 2020-09-23 MED ORDER — LIDOCAINE HCL (PF) 1 % IJ SOLN
INTRAMUSCULAR | Status: DC | PRN
  Administered 2020-09-23: 60 mL

## 2020-09-23 MED ORDER — MIDAZOLAM HCL 5 MG/5ML IJ SOLN
INTRAMUSCULAR | Status: DC | PRN
  Administered 2020-09-23: 1 mg via INTRAVENOUS

## 2020-09-23 MED ORDER — SODIUM CHLORIDE 0.9 % IV SOLN
INTRAVENOUS | Status: AC
  Filled 2020-09-23: qty 2

## 2020-09-23 MED ORDER — MIDAZOLAM HCL 5 MG/5ML IJ SOLN
INTRAMUSCULAR | Status: AC
  Filled 2020-09-23: qty 5

## 2020-09-23 MED ORDER — ACETAMINOPHEN 325 MG PO TABS
325.0000 mg | ORAL_TABLET | ORAL | Status: DC | PRN
  Filled 2020-09-23: qty 2

## 2020-09-23 MED ORDER — SODIUM CHLORIDE 0.9 % IV SOLN
80.0000 mg | INTRAVENOUS | Status: AC
  Administered 2020-09-23: 80 mg

## 2020-09-23 MED ORDER — CEFAZOLIN SODIUM-DEXTROSE 2-4 GM/100ML-% IV SOLN
2.0000 g | INTRAVENOUS | Status: AC
  Administered 2020-09-23: 2 g via INTRAVENOUS

## 2020-09-23 MED ORDER — SODIUM CHLORIDE 0.9% FLUSH: 3.0000 mL | Freq: Two times a day (BID) | INTRAVENOUS | Status: DC

## 2020-09-23 MED ORDER — POVIDONE-IODINE 10 % EX SWAB
2.0000 "application " | Freq: Once | CUTANEOUS | Status: AC
  Administered 2020-09-23: 2 via TOPICAL

## 2020-09-23 MED ORDER — LIDOCAINE HCL (PF) 1 % IJ SOLN
INTRAMUSCULAR | Status: AC
  Filled 2020-09-23: qty 60

## 2020-09-23 MED ORDER — CEFAZOLIN SODIUM-DEXTROSE 2-4 GM/100ML-% IV SOLN
INTRAVENOUS | Status: AC
  Filled 2020-09-23: qty 100

## 2020-09-23 MED ORDER — SODIUM CHLORIDE 0.9% FLUSH: 3.0000 mL | INTRAVENOUS | Status: DC | PRN

## 2020-09-23 MED ORDER — SODIUM CHLORIDE 0.9 % IV SOLN: INTRAVENOUS | Status: DC

## 2020-09-23 MED ORDER — FENTANYL CITRATE (PF) 100 MCG/2ML IJ SOLN
INTRAMUSCULAR | Status: AC
  Filled 2020-09-23: qty 2

## 2020-09-23 MED ORDER — ONDANSETRON HCL 4 MG/2ML IJ SOLN: 4.0000 mg | Freq: Four times a day (QID) | INTRAMUSCULAR | Status: DC | PRN

## 2020-09-23 MED ORDER — CHLORHEXIDINE GLUCONATE 4 % EX LIQD: 4.0000 "application " | Freq: Once | CUTANEOUS | Status: DC

## 2020-09-23 MED ORDER — SODIUM CHLORIDE 0.9 % IV SOLN: 250.0000 mL | INTRAVENOUS | Status: DC | PRN

## 2020-09-23 SURGICAL SUPPLY — 6 items
CABLE SURGICAL S-101-97-12 (CABLE) ×2 IMPLANT
ICD MOMENTUM D121 (ICD Generator) ×1 IMPLANT
PAD PRO RADIOLUCENT 2001M-C (PAD) ×2 IMPLANT
POUCH AIGIS-R ANTIBACT PPM (Mesh General) ×2 IMPLANT
POUCH AIGIS-R ANTIBACT PPM MED (Mesh General) IMPLANT
TRAY PACEMAKER INSERTION (PACKS) ×2 IMPLANT
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
## 2020-09-23 NOTE — Discharge Instructions (Signed)
Resume eliquis on Sunday 09/26/20 with pm dose  Implantable Cardiac Device Battery Change, Care After  This sheet gives you information about how to care for yourself after your procedure. Your health care provider may also give you more specific instructions. If you have problems or questions, contact your health care provider. What can I expect after the procedure? After your procedure, it is common to have:  Pain or soreness at the site where the cardiac device was inserted.  Swelling at the site where the cardiac device was inserted.  You should received an information card for your new device in 4-8 weeks. Follow these instructions at home: Incision care   Keep the incision clean and dry. ? Do not take baths, swim, or use a hot tub until after your wound check.  ? Do not shower for at least 7 days, or as directed by your health care provider. ? Pat the area dry with a clean towel. Do not rub the area. This may cause bleeding.  Follow instructions from your health care provider about how to take care of your incision. Make sure you: ? Leave stitches (sutures), skin glue, or adhesive strips in place. These skin closures may need to stay in place for 2 weeks or longer. If adhesive strip edges start to loosen and curl up, you may trim the loose edges. Do not remove adhesive strips completely unless your health care provider tells you to do that.  Check your incision area every day for signs of infection. Check for: ? More redness, swelling, or pain. ? More fluid or blood. ? Warmth. ? Pus or a bad smell. Activity  Do not lift anything that is heavier than 10 lb (4.5 kg) until your health care provider says it is okay to do so.  For the first week, or as long as told by your health care provider: ? Avoid lifting your affected arm higher than your shoulder. ? After 1 week, Be gentle when you move your arms over your head. It is okay to raise your arm to comb your hair. ? Avoid  strenuous exercise.  Ask your health care provider when it is okay to: ? Resume your normal activities. ? Return to work or school. ? Resume sexual activity. Eating and drinking  Eat a heart-healthy diet. This should include plenty of fresh fruits and vegetables, whole grains, low-fat dairy products, and lean protein like chicken and fish.  Limit alcohol intake to no more than 1 drink a day for non-pregnant women and 2 drinks a day for men. One drink equals 12 oz of beer, 5 oz of wine, or 1 oz of hard liquor.  Check ingredients and nutrition facts on packaged foods and beverages. Avoid the following types of food: ? Food that is high in salt (sodium). ? Food that is high in saturated fat, like full-fat dairy or red meat. ? Food that is high in trans fat, like fried food. ? Food and drinks that are high in sugar. Lifestyle  Do not use any products that contain nicotine or tobacco, such as cigarettes and e-cigarettes. If you need help quitting, ask your health care provider.  Take steps to manage and control your weight.  Once cleared, get regular exercise. Aim for 150 minutes of moderate-intensity exercise (such as walking or yoga) or 75 minutes of vigorous exercise (such as running or swimming) each week.  Manage other health problems, such as diabetes or high blood pressure. Ask your health care provider how  you can manage these conditions. General instructions  Do not drive for 24 hours after your procedure if you were given a medicine to help you relax (sedative).  Take over-the-counter and prescription medicines only as told by your health care provider.  Avoid putting pressure on the area where the cardiac device was placed.  If you need an MRI after your cardiac device has been placed, be sure to tell the health care provider who orders the MRI that you have a cardiac device.  Avoid close and prolonged exposure to electrical devices that have strong magnetic fields. These  include: ? Cell phones. Avoid keeping them in a pocket near the cardiac device, and try using the ear opposite the cardiac device. ? MP3 players. ? Household appliances, like microwaves. ? Metal detectors. ? Electric generators. ? High-tension wires.  Keep all follow-up visits as directed by your health care provider. This is important. Contact a health care provider if:  You have pain at the incision site that is not relieved by over-the-counter or prescription medicines.  You have any of these around your incision site or coming from it: ? More redness, swelling, or pain. ? Fluid or blood. ? Warmth to the touch. ? Pus or a bad smell.  You have a fever.  You feel brief, occasional palpitations, light-headedness, or any symptoms that you think might be related to your heart. Get help right away if:  You experience chest pain that is different from the pain at the cardiac device site.  You develop a red streak that extends above or below the incision site.  You experience shortness of breath.  You have palpitations or an irregular heartbeat.  You have light-headedness that does not go away quickly.  You faint or have dizzy spells.  Your pulse suddenly drops or increases rapidly and does not return to normal.  You begin to gain weight and your legs and ankles swell. Summary  After your procedure, it is common to have pain, soreness, and some swelling where the cardiac device was inserted.  Make sure to keep your incision clean and dry. Follow instructions from your health care provider about how to take care of your incision.  Check your incision every day for signs of infection, such as more pain or swelling, pus or a bad smell, warmth, or leaking fluid and blood.  Avoid strenuous exercise and lifting your left arm higher than your shoulder for 2 weeks, or as long as told by your health care provider. This information is not intended to replace advice given to you by  your health care provider. Make sure you discuss any questions you have with your health care provider.

## 2020-09-24 ENCOUNTER — Encounter (HOSPITAL_COMMUNITY): Payer: Self-pay | Admitting: Internal Medicine

## 2020-09-28 ENCOUNTER — Telehealth: Payer: Self-pay

## 2020-09-28 ENCOUNTER — Ambulatory Visit: Payer: Medicare HMO | Admitting: Adult Health

## 2020-09-28 NOTE — Telephone Encounter (Signed)
Patient wife called in stating patient is feeling nauseated and thinks they are going to get shocked. Patient recently has a Gen change out and no home monitor to send a transmission and wants to know if they can come in a device get checked in office. Patient had to sit down and rest as he doesn't feel good.

## 2020-09-28 NOTE — Telephone Encounter (Signed)
Patient has scheduled in clinic appointment 10/01/20 with Dr. Rayann Heman.

## 2020-09-28 NOTE — Telephone Encounter (Signed)
Spoke with patient. Patient states that he felt nauseated and like he was going to receive a shock. Patient had a recent Gen Change and does not currently have a monitor [facility]) on backorder. Patient was sitting down in a chair watching TV and was asymptomatic initially and felt like he was going to receive a shock. Patient laid down and nausea was relieved. Patient then stated that he felt better and does not feel as if he is going to receive a shock. ED precautions and shock plan covered with patient. Patient concerned (and need for being seen in office) No availability on schedule for today. Patient denies any dizziness, shortness of breath, or chest pain and was not having any of these symptoms prior. ED precautions reinforced and advised patient that information would be routed to Dr. Rayann Heman for review/recomendations.

## 2020-10-01 ENCOUNTER — Ambulatory Visit (INDEPENDENT_AMBULATORY_CARE_PROVIDER_SITE_OTHER): Payer: Medicare HMO | Admitting: Internal Medicine

## 2020-10-01 ENCOUNTER — Encounter: Payer: Self-pay | Admitting: Internal Medicine

## 2020-10-01 ENCOUNTER — Other Ambulatory Visit: Payer: Self-pay

## 2020-10-01 VITALS — BP 112/60 | HR 63 | Ht 69.0 in | Wt 177.4 lb

## 2020-10-01 DIAGNOSIS — I472 Ventricular tachycardia, unspecified: Secondary | ICD-10-CM

## 2020-10-01 DIAGNOSIS — I5022 Chronic systolic (congestive) heart failure: Secondary | ICD-10-CM

## 2020-10-01 DIAGNOSIS — I2510 Atherosclerotic heart disease of native coronary artery without angina pectoris: Secondary | ICD-10-CM

## 2020-10-01 DIAGNOSIS — I480 Paroxysmal atrial fibrillation: Secondary | ICD-10-CM

## 2020-10-01 DIAGNOSIS — I255 Ischemic cardiomyopathy: Secondary | ICD-10-CM | POA: Diagnosis not present
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
## 2020-10-01 NOTE — Patient Instructions (Addendum)
Medication Instructions:  Your physician recommends that you continue on your current medications as directed. Please refer to the Current Medication list given to you today.  Labwork: None ordered.  Testing/Procedures: None ordered.  Follow-Up: Your physician wants you to follow-up in: 01/03/21 at 3:45 pm with Dr. Rayann Heman.  Remote monitoring is used to monitor your Pacemaker of ICD from home. This monitoring reduces the number of office visits required to check your device to one time per year. It allows [place] to keep an eye on the functioning of your device to ensure it is working properly. You are scheduled for a device check from home on 12/27/20. You may send your transmission at any time that day. If you have a wireless device, the transmission will be sent automatically. After your physician reviews your transmission, you will receive a postcard with your next transmission date.  Any Other Special Instructions Will Be Listed Below (If Applicable).  If you need a refill on your cardiac medications before your next appointment, please call your pharmacy.

## 2020-10-04 ENCOUNTER — Ambulatory Visit (INDEPENDENT_AMBULATORY_CARE_PROVIDER_SITE_OTHER): Payer: Medicare HMO | Admitting: Podiatry

## 2020-10-04 ENCOUNTER — Other Ambulatory Visit: Payer: Self-pay

## 2020-10-04 ENCOUNTER — Encounter: Payer: Self-pay | Admitting: Podiatry

## 2020-10-04 DIAGNOSIS — M79676 Pain in unspecified toe(s): Secondary | ICD-10-CM

## 2020-10-04 DIAGNOSIS — B351 Tinea unguium: Secondary | ICD-10-CM

## 2020-10-04 DIAGNOSIS — L84 Corns and callosities: Secondary | ICD-10-CM | POA: Diagnosis not present

## 2020-10-04 DIAGNOSIS — E0843 Diabetes mellitus due to underlying condition with diabetic autonomic (poly)neuropathy: Secondary | ICD-10-CM

## 2020-10-05 ENCOUNTER — Other Ambulatory Visit: Payer: Self-pay | Admitting: Adult Health

## 2020-10-05 ENCOUNTER — Encounter: Payer: Self-pay | Admitting: Adult Health

## 2020-10-05 DIAGNOSIS — M25649 Stiffness of unspecified hand, not elsewhere classified: Secondary | ICD-10-CM

## 2020-10-07 ENCOUNTER — Ambulatory Visit: Payer: Medicare HMO

## 2020-10-07 ENCOUNTER — Ambulatory Visit: Payer: Medicare HMO | Admitting: Adult Health

## 2020-10-08 ENCOUNTER — Telehealth: Payer: Self-pay | Admitting: Pulmonary Disease

## 2020-10-08 DIAGNOSIS — G4733 Obstructive sleep apnea (adult) (pediatric): Secondary | ICD-10-CM

## 2020-10-08 NOTE — Telephone Encounter (Signed)
I called and spoke with patient's wife, who is on the DPR and she verbalized the patient CPAP machine is still messing up and barely working. They have been back and forth with [employer] to come and fix it and its still not fixed and they have no loaner machine to give to patient. The wife has [employer] and spoke with them and [employer] told her that he could come to [facility] and they would give him a new machine. The patient has not been using CPAP due to it not working and wife is concerned. I sent in order to [employer] with given CPAP pressures. Wife verbalized understanding, nothing further needed at this time.

## 2020-10-09 NOTE — Progress Notes (Signed)
Subjective: [name] is a pleasant 75 y.o. male patient seen today painful thick toenails that are difficult to trim. Pain interferes with ambulation. Aggravating factors include wearing enclosed shoe gear. Pain is relieved with periodic professional debridement.  His blood glucose was 98 mg/dl yesterday.  PCP is Dorothyann Peng, NP, and last visit was 07/27/2020.  Allergies  Allergen Reactions  . Aricept [Donepezil Hcl] Other (See Comments)    Worsens renal function  . Codeine Hives  . Nexium [Esomeprazole] Other (See Comments)    Severely worsens renal function  . Omeprazole Hives  . Pantoprazole Sodium Other (See Comments)    Renal failure  . Tramadol Nausea And Vomiting    Objective: Physical Exam  General: [name] is a pleasant 74 y.o. African American male, in NAD. AAO x 3.   Vascular:  Capillary refill time to digits <3 seconds b/l.  Palpable pedal pulses b/l LE. Pedal hair sparse b/l. Lower extremity skin temperature gradient within normal limits. No pain with calf compression b/l. No edema noted b/l lower extremities.   Dermatological:  Pedal skin with normal turgor, texture and tone bilaterally. No open wounds bilaterally. No interdigital macerations bilaterally. Toenails 1-5 b/l elongated, discolored, dystrophic, thickened, crumbly with subungual debris and tenderness to dorsal palpation.   Hyperkeratotic lesion(s) submet head 1 left foot, submet head 1 right foot, submet head 5 left foot and submet head 5 right foot with tenderness to palpation. No edema, no erythema, no drainage, no fluctuance.  Musculoskeletal:  Normal muscle strength 5/5 to all lower extremity muscle groups bilaterally. No pain crepitus or joint limitation noted with ROM b/l. Hallux valgus with bunion deformity noted b/l lower extremities. Tailor's bunion deformity noted b/l lower extremities.  Neurological:  Protective sensation intact 5/5 intact bilaterally with 10g monofilament  b/l. Vibratory sensation intact b/l. Clonus negative b/l.  Assessment and Plan:  1. Pain due to onychomycosis of toenail   2. Callus   3. Diabetes mellitus due to underlying condition with diabetic autonomic neuropathy, unspecified whether long term insulin use ([place])     -Examined patient. -Continue diabetic foot care principles. -Patient to continue soft, supportive shoe gear daily. -Toenails 1-5 b/l were debrided in length and girth with sterile nail nippers and dremel without iatrogenic bleeding.  -Callus(es) submet head 1 left foot, submet head 1 right foot, submet head 5 left foot and submet head 5 right foot pared utilizing sterile scalpel blade without complication or incident. Total number debrided =4. -Patient to report any pedal injuries to medical professional immediately. -Patient/POA to call should there be question/concern in the interim.  Return in about 3 months (around 01/04/2021).  Marzetta Board, DPM

## 2020-10-13 ENCOUNTER — Ambulatory Visit: Payer: Medicare HMO | Admitting: Occupational Therapy

## 2020-10-13 ENCOUNTER — Telehealth: Payer: Self-pay

## 2020-10-13 MED ORDER — VIMPAT 100 MG PO TABS: ORAL_TABLET | ORAL | 3 refills | Status: DC

## 2020-10-13 NOTE — Addendum Note (Signed)
Addended by: Cameron Sprang on: 10/13/2020 12:34 PM   Modules accepted: Orders

## 2020-10-13 NOTE — Telephone Encounter (Signed)
Spoke with pt wife informed her that we sent in script for name brand vimpat and that we have [employer] assistance paperwork for her to fill out to try to get help with getting the medication. Pt wife is going to come by today to get the paper work

## 2020-10-15 ENCOUNTER — Telehealth: Payer: Self-pay | Admitting: Adult Health

## 2020-10-15 NOTE — Telephone Encounter (Signed)
[facility]anton Kidney is calling and sated that they sent a 11 page fax regarding patient get a powerwheel chair and wanted to see if provider received it. Also if received caller is requesting a call back to go over paperwork prior to appointment, please advise. [phone] ext [phone]

## 2020-10-15 NOTE — Telephone Encounter (Signed)
Have you seen this anywhere? I havent

## 2020-10-19 ENCOUNTER — Other Ambulatory Visit: Payer: Self-pay

## 2020-10-19 ENCOUNTER — Encounter: Payer: Self-pay | Admitting: Adult Health

## 2020-10-19 ENCOUNTER — Ambulatory Visit (INDEPENDENT_AMBULATORY_CARE_PROVIDER_SITE_OTHER): Payer: Medicare HMO | Admitting: Adult Health

## 2020-10-19 VITALS — BP 120/62 | HR 56 | Temp 97.7°F | Ht 69.0 in | Wt 180.0 lb

## 2020-10-19 DIAGNOSIS — Z7689 Persons encountering health services in other specified circumstances: Secondary | ICD-10-CM | POA: Diagnosis not present
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
## 2020-10-19 NOTE — Telephone Encounter (Signed)
PPW found! Pt has an appt. Today for mobility.

## 2020-10-19 NOTE — Telephone Encounter (Signed)
Stanton Kidney is calling from [employer] and is requesting a call back regarding power wheelchair order, please advise. CB is [phone] ext [phone]

## 2020-10-19 NOTE — Telephone Encounter (Signed)
Left detailed message.   

## 2020-10-20 DIAGNOSIS — I129 Hypertensive chronic kidney disease with stage 1 through stage 4 chronic kidney disease, or unspecified chronic kidney disease: Secondary | ICD-10-CM | POA: Diagnosis not present

## 2020-10-20 DIAGNOSIS — N189 Chronic kidney disease, unspecified: Secondary | ICD-10-CM | POA: Diagnosis not present

## 2020-10-20 DIAGNOSIS — N2581 Secondary hyperparathyroidism of renal origin: Secondary | ICD-10-CM | POA: Diagnosis not present

## 2020-10-20 DIAGNOSIS — N183 Chronic kidney disease, stage 3 unspecified: Secondary | ICD-10-CM | POA: Diagnosis not present

## 2020-10-20 DIAGNOSIS — D631 Anemia in chronic kidney disease: Secondary | ICD-10-CM | POA: Diagnosis not present

## 2020-10-20 LAB — COMPREHENSIVE METABOLIC PANEL
Albumin: 4.5 (ref 3.5–5.0)
Calcium: 10.4 (ref 8.7–10.7)
GFR calc Af Amer: 41

## 2020-10-20 LAB — CBC AND DIFFERENTIAL: Hemoglobin: 14.6 (ref 13.5–17.5)

## 2020-10-20 LAB — BASIC METABOLIC PANEL
CO2: 27 — AB (ref 13–22)
Chloride: 105 (ref 99–108)
Creatinine: 1.7 — AB (ref 0.6–1.3)
Glucose: 170
Potassium: 4.4 (ref 3.4–5.3)
Sodium: 143 (ref 137–147)

## 2020-10-21 NOTE — Telephone Encounter (Signed)
[name] is calling back from [facility] and is requesting a call back regarding patient, please advise. CB [phone]

## 2020-10-21 NOTE — Telephone Encounter (Signed)
Spoke to [place] and she stated she sent over a fax to complete the wheelchair order for pt. I advised that I will be on the lookout for the form. [name] verbalized understanding.

## 2020-10-22 NOTE — Telephone Encounter (Signed)
Christina notified that master Rx was received and placed in Cory's folder.

## 2020-10-25 ENCOUNTER — Ambulatory Visit: Payer: Medicare Other | Attending: Adult Health | Admitting: Occupational Therapy

## 2020-10-25 ENCOUNTER — Other Ambulatory Visit: Payer: Self-pay

## 2020-10-25 ENCOUNTER — Encounter: Payer: Self-pay | Admitting: Occupational Therapy

## 2020-10-25 DIAGNOSIS — M25642 Stiffness of left hand, not elsewhere classified: Secondary | ICD-10-CM

## 2020-10-25 DIAGNOSIS — M6281 Muscle weakness (generalized): Secondary | ICD-10-CM

## 2020-10-25 DIAGNOSIS — R296 Repeated falls: Secondary | ICD-10-CM | POA: Insufficient documentation

## 2020-10-25 DIAGNOSIS — M25632 Stiffness of left wrist, not elsewhere classified: Secondary | ICD-10-CM
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
## 2020-10-29 ENCOUNTER — Encounter: Payer: Self-pay | Admitting: Adult Health

## 2020-11-01 ENCOUNTER — Ambulatory Visit: Payer: Medicare Other | Admitting: Occupational Therapy

## 2020-11-01 NOTE — Telephone Encounter (Signed)
Spoke to pt to see if he went to UC. Pt spouse stated that he did not go or call to make an appt. According to spouse pt went to pharmacy and pharmacist recommended a spray. Spouse then joined the conversation and stated that the rash is still visible but it does not itch anymore. Pt is concerned that it may be Shingles. Pt has been scheduled for ov.

## 2020-11-02 ENCOUNTER — Ambulatory Visit: Payer: Medicare Other | Admitting: Occupational Therapy

## 2020-11-02 ENCOUNTER — Ambulatory Visit (INDEPENDENT_AMBULATORY_CARE_PROVIDER_SITE_OTHER): Payer: Medicare Other | Admitting: Family Medicine

## 2020-11-02 ENCOUNTER — Other Ambulatory Visit: Payer: Self-pay

## 2020-11-02 ENCOUNTER — Encounter: Payer: Self-pay | Admitting: Occupational Therapy

## 2020-11-02 VITALS — BP 120/72 | HR 65 | Temp 98.5°F | Wt 177.6 lb

## 2020-11-02 DIAGNOSIS — M25642 Stiffness of left hand, not elsewhere classified: Secondary | ICD-10-CM | POA: Diagnosis not present

## 2020-11-02 DIAGNOSIS — M6281 Muscle weakness (generalized): Secondary | ICD-10-CM | POA: Diagnosis not present

## 2020-11-02 DIAGNOSIS — M25632 Stiffness of left wrist, not elsewhere classified: Secondary | ICD-10-CM | POA: Diagnosis not present

## 2020-11-02 DIAGNOSIS — T7840XA Allergy, unspecified, initial encounter: Secondary | ICD-10-CM | POA: Diagnosis not present

## 2020-11-02 DIAGNOSIS — R296 Repeated falls: Secondary | ICD-10-CM | POA: Diagnosis not present

## 2020-11-02 MED ORDER — METHYLPREDNISOLONE ACETATE 80 MG/ML IJ SUSP
80.0000 mg | Freq: Once | INTRAMUSCULAR | Status: AC
  Administered 2020-11-02: 80 mg via INTRAMUSCULAR

## 2020-11-02 MED ORDER — TRIAMCINOLONE ACETONIDE 0.1 % EX CREA: 1.0000 "application " | TOPICAL_CREAM | Freq: Two times a day (BID) | CUTANEOUS | 0 refills | Status: DC

## 2020-11-02 NOTE — Patient Instructions (Signed)
Stay cool as possible  Consider OTC Zyrtec   May also use the topical Triamcinolone cream
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
## 2020-11-03 ENCOUNTER — Ambulatory Visit: Payer: Medicare Other | Admitting: Psychology

## 2020-11-03 ENCOUNTER — Encounter: Payer: Self-pay | Admitting: Counselor

## 2020-11-03 ENCOUNTER — Ambulatory Visit (INDEPENDENT_AMBULATORY_CARE_PROVIDER_SITE_OTHER): Payer: Medicare Other | Admitting: Counselor

## 2020-11-03 DIAGNOSIS — R419 Unspecified symptoms and signs involving cognitive functions and awareness: Secondary | ICD-10-CM

## 2020-11-03 DIAGNOSIS — Z8673 Personal history of transient ischemic attack (TIA), and cerebral infarction without residual deficits: Secondary | ICD-10-CM

## 2020-11-03 DIAGNOSIS — G40209 Localization-related (focal) (partial) symptomatic epilepsy and epileptic syndromes with complex partial seizures, not intractable, without status epilepticus: Secondary | ICD-10-CM

## 2020-11-03 DIAGNOSIS — F09 Unspecified mental disorder due to known physiological condition: Secondary | ICD-10-CM
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
## 2020-11-03 NOTE — Progress Notes (Signed)
   Psychometrist Note   Cognitive testing was administered to [name] by [name], B.S. (Technician) under the supervision of [name], Psy.D., ABN. [name] was able to tolerate all test procedures. Dr. Nicole [name] met with the patient as needed to manage any emotional reactions to the testing procedures. Rest breaks were offered.    The battery of tests administered was selected by Dr. Nicole [name] with consideration to the patient's current level of functioning, the nature of his symptoms, emotional and behavioral responses during the interview, level of literacy, observed level of motivation/effort, and the nature of the referral question. This battery was communicated to the psychometrist. Communication between Dr. Nicole [name] and the psychometrist was ongoing throughout the evaluation and Dr. Nicole [name] was immediately accessible at all times. Dr. Nicole [name] provided supervision to the technician on the date of this service, to the extent necessary to assure the quality of all services provided.    [name] will return in approximately one week for an interactive feedback session with Dr. Nicole [name], at which time test performance, clinical impressions, and treatment recommendations will be reviewed in detail. The patient understands he can contact our office should he require our assistance before this time.   A total of 120 minutes of billable time were spent with [name] by the technician, including test administration and scoring time. Billing for these services is reflected in Dr. Les Pou note.   This note reflects time spent with the psychometrician and does not include test scores, clinical history, or any interpretations made by Dr. Nicole [name]. The full report will follow in a separate note.

## 2020-11-03 NOTE — Progress Notes (Signed)
[facility]  Patient Name: [name] MRN: [id number] Date of Birth: 09-17-1945 Age: 75 y.o. Education: 8 years  Measurement properties of test scores: IQ, Index, and Standard Scores (SS): Mean = 100; Standard Deviation = 15 Scaled Scores (Ss): Mean = 10; Standard Deviation = 3 Z scores (Z): Mean = 0; Standard Deviation = 1 T scores (T); Mean = 50; Standard Deviation = 10  TEST SCORES:    Note: This summary of test scores accompanies the interpretive report and should not be interpreted by unqualified individuals or in isolation without reference to the report. Test scores are relative to age, gender, and educational history as available and appropriate.   Performance Validity        ACS: Raw Descriptor      Word Choice: 41 Below Expectation      The Dot Counting Test: Raw Descriptor      E-Score 12 Within Expectation  "A" Random Letter Test Errors 0 Within Expectation      Embedded Measures: Raw Descriptor      RBANS Effort Index: 2 Within Expectation      WAIS-IV Reliable Digit Span 9 Within Expectation      WAIS-IV Reliable Digit Span Revised 11 Within Expectation      Mental Status Screening     Total Score Descriptor  MMSE 22 Mild Dementia      Expected Functioning        Wide Range Achievement Test (Word Reading): Standard/Scaled Score Percentile       Word Reading 65 1      [place] Intellectual Screening Test Standard/T-score Percentile      Guess What 46 34      Odd Item Out 55 69  RIST Index 101 53      Cognitive Testing        RBANS, Form : Standard/Scaled Score Percentile  Total Score 75 5  Immediate Memory 69 2      List Learning 5 5      Story Memory 4 2  Visuospatial/Constructional 87 19      Figure Copy   (18) 10 50      Judgment of Line Orientation   (12) --- 3-9  Language 96 39      Picture Naming --- 51-75      Semantic Fluency 8 25  Attention 91 27      Digit Span 14 91      Coding 3 1   Delayed Memory 56 <1      List Recall   (0) --- <2      List Recognition   (16) --- <2      Story Recall   (1) 2 <1      Figure Recall   (5) 5 5      Wechsler Adult Intelligence Scale - IV: Standard/Scaled Score Percentile  Working Memory Index 95 37      Digit Span 9 37          Digit Span Forward 12 75          Digit Span Backward 8 25          Digit Span Sequencing 7 16      Arithmetic 9 37  Processing Speed Index 81 10      Symbol Search 7 16      Coding 6 9      Neuropsychological Assessment Battery (Language Module): T-score Percentile  Naming   (31) 59 82      Verbal Fluency: T-score Percentile      Controlled Oral Word Association (F-A-S) 53 62      Semantic Fluency (Animals) 42 21      Trail Making Test: T-Score Percentile      Part A 57 75      Part B 57 75      Modified [place] Card Sorting Test (MWCST): Standard/T-Score Percentile      Number of Categories Correct 49 46      Number of Perseverative Errors 60 84      Number of Total Errors 60 84      Percent Perseverative Errors 61 86  Executive Function Composite 107 68      [place] Diagnostic Aphasia Exam: Raw Score Scaled Score      Complex Ideational Material 12 12      Clock Drawing Raw Score Descriptor      Command 8 Borderline Impairment      Rating Scales         Raw Score Descriptor  Patient Health Questionnaire - 9 17 Moderately-Severe  GAD-7 19 Severe      Clinical Dementia Rating Raw Score Descriptor      Sum of Boxes 2.5 Very Mild Dementia      Global Score 0.5 MCI    Finbar Nippert V. Nicole Kindred PsyD, [place] Clinical Neuropsychologist

## 2020-11-04 NOTE — Progress Notes (Signed)
[facility]  Patient Name: [name] MRN: [id number] Date of Birth: 1946/02/06 Age: 75 y.o. Education: 8 years  Clinical Impressions  [name] is a 75 y.o., right-hand dominant, married man with a history of HTN, CAD, AFIB, and L MCA territory frontotemporal stroke in 2009. His wife noticed fairly minimal cognitive symptoms following his stroke but feels as though he has developed significant cognitive problems over the past several years. History taking was challenging related to marital conflict and the patient will also blame behaviors that his wife does not approve of on his memory problems. They were clear that he has very significant forgetting of information, that he has some personality changes in terms of agitation, and that he has fairly significant apathy. His wife also reported he is acting out his dreams although he had a sleep study in 2019 and I see no mention of RBD in the report.   On neuropsychological assessment, there is evidence of word reading at a 2.8th grade level, suggesting essential illiteracy. This could be due to a learning disorder, limited academic enrichment, or some combination thereof. With that consideration [name] did fairly well in most areas, although he is demonstrating very significant problems on measures of memory. Immediate and delayed recall for verbal information were extremely low and delayed recall for visual information was also low. The latter finding is not explained well by his limited literacy and as such is concerning for possible early memory storage problems. Isolated low scores were obtained on judgment of angular line orientations and one measure of processing speed, whereas he generally did well on other measures. He is not demonstrating impaired naming or semantic fluency. He screened in the moderately severe range for depressive symptoms and the severe range for anxiety symptoms.   Mr.  [name] is thus demonstrating memory impairment amounting to a mild cognitive impairment level problem. While it is possible that he is at a dementia level of function, I think that marital discord, behavioral avoidance, and premorbid status are coloring the picture and therefore that MCI is a better diagnosis at this time. I am concerned that there may be an underlying condition, as focal memory problems are not well explained by his inferior frontal stroke. Lateralization of findings is obfuscated by limited literacy. I think he and his wife will benefit from marital counseling and the patient himself would likely benefit from psychiatric medication management and behavioral activation. 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 This tempers expectations for your test findings. Within those constraints you did fairly well in most areas although there are fairly significant memory problems. While these are most pronounced on measures involving memory for verbal information, you also had difficulties with visual information. These are consistent with your wife's observation of repeating and the like. I think that the best diagnosis at this time is mild cognitive impairment, although this is with the caveat that there are many confounds and it is possible that you are at more of a dementia level problem.  The major difference between mild cognitive impairment (MCI) and dementia is in severity and potential prognosis. Once someone reaches a level of severity adequate to  be diagnosed with a dementia, there is usually progression over time,  though this may be years. On the other hand, mild cognitive impairment, while a significant risk for dementia in future, does not always progress to dementia, and in some instances stays the same or can even revert to normal. It is important to realize that if MCI is due to underlying Alzheimer's disease, it will most likely progress to dementia eventually. The rate of conversion to Alzheimer's dementia from amnestic MCI is about 15% per year versus the general population risk of conversion of 2% per year.   In your case, I do not think that your memory problems and continued gradual decline are well explained by your history of stroke. You have no known other progressive problems that should cause cognitive impairment and therefore, the condition causing your MCI is not known. It is possible that it is an underlying condition like Alzheimer's disease, although we cannot know for sure. You should return for follow up evaluation in 1 to 2 years to see how you are managing over time.   Regardless of what is causing your cognitive problems, I think there are many things that could be treated that might improve your quality of life and relationship satisfaction. I am specifically thinking about your depression, level of relational conflict, low activity level, and you are already in OT for your hand, which I strongly support.   Depression can affect cognitive functioning in several ways. For one, there are neurobiological changes in depression that can contribute to attention, concentration, and memory problems. These changes are so common they are part of the criteria we use to diagnose depressive disorders. Depression can also negatively impact your own appraisal of your cognitive abilities, leading you to feel like you are performing more poorly than is objectively warranted. Psychiatric or psychological treatment (or both) are the treatment of choice for such difficulties. You may also consider couples  counseling.  There is a significant research base and evidence of effectiveness for something called "behavioral activation," which is a fancy way of saying that you should increase your activity level. In general, people do not feel as happy or do as well when they are not doing much. This can include things like getting out for walks, re-engaging in hobbies, spending time with family or friends, or learning a new hobby. It's not so important what you do as that you enjoy it and stick with it. Depression can start a vicious cycle where you are not doing a lot because you don't feel well, which leads to less things to be excited and happy about, and thus more depression and behavioral avoidance. It can be hard to change this pattern once it has started but most people find that they feel better when they start doing more even if they don't enjoy it at first.   Countless studies have found a link between diabetes (particularly midlife diabetes) and dementia, with general estimates of about 1.5 - 2 times the general population risk of developing dementia for diabetic individuals. Individuals with poorly controlled diabetes in midlife are at particularly high risk with those having an HbA1c ? 6.5% at about a 3-fold risk and those with an HbA1c ? 7% at a 5-fold risk of developing dementia as compared with the general population. Work diligently to control your diabetes. Many of the healthy lifestyle changes that can help manage blood sugar (things like exercise and diet) have also been shown to have beneficial effects on brain health.  There are few things as disruptive to brain functioning as not getting a good night's sleep. For sleep, I recommend against using medications, which can have lingering sedating effects on the brain and rob your brain of restful REM sleep. Instead, consider trying some of the following sleep hygiene recommendations. They may not work at once and may take effort, but the effort you  spend is likely to be rewarded with better sleep eventually:  Stick to a sleep schedule of the same bedtime and wake time even on the weekends, which can help to regulate your body's internal clock so that you fall asleep and stay asleep.  Practice a relaxing bedtime ritual (conducted away from bright lights) which will help separate your sleep from stimulating activities and prepare your body to fall asleep when you go to bed.  Avoid naps, especially in the afternoon.  Evaluate your room and create conditions that will promote sleep such as keeping it cool (between 60 - 67 degrees), quiet, and free from any lights. Consider using blackout curtains, a "white noise" generator, or fan that will help mask any noises that might prevent you from going to sleep or awaken you during the night.  Sleep on a comfortable mattress and pillows.  Avoid bright light in the evening and excessive use of portable electronic devices right before bed that may contain light frequencies that can contribute to sleep problems.  Avoid alcohol, cigarettes, or heavy meals in the evening. If you must eat, consume a light snack 45 minutes before bed.  Use your bed only for sleep to strengthen the association between your bed and sleep.  If you can't go to sleep within 30 minutes, go into another room and do something relaxing until you feel tired. Then, come back and try to go to sleep again for 30 minutes and repeat until sleep is achieved.  Some people find over the counter melatonin to be helpful for sleep, which you could discuss with a pharmacist or prescribing provider.   Test Findings  Test scores are summarized in additional documentation associated with this encounter. Test scores are relative to age, gender, and educational history as available and appropriate. There were no concerns about performance validity despite one anomalous indicator. He performed within normal limits on several other such indicators.   General  Intellectual Functioning/Achievement:  Performance on single word reading was extremely low, equivalent to a 2.8th grade level, which is suggestive of a learning disorder versus limited academic enrichment. He performed well, by contrast, on the RIST index, which was at an average level with average range performance on the verbally and visually oriented subtests.    Attention and Processing Efficiency: Indicators of attention and working memory showed very good performance with superior range digit repetition on the RBANS and an average overall score on the Working Memory Index of the WAIS-IV. Digit repetition forward and backward were average on WAIS subtests, whereas digit resequencing in ascending order was low average. Mental solving of arithmetical word problems was average.   With respect to processing speed, performance was low average on the WAIS-IV Processing Speed Index. My sense is that the patient's performance may have been pulled down on some tasks by his limited literacy and it is also possible that he has a learning disorder affecting reading such as dyslexia, which often shows up on these tests in my experience. Timed number-symbol coding was extremely low on the RBANS and was unusually low on the WAIS. Performance was low average on  a measure of efficient visual matching and efficient visual scanning.   Language: Performance on measures of language function was generally good. Visual object confrontation naming was errorless. Generation of words by category was average for fruits and vegetables and low average for animals. His performance was average when generating words on the basis of letter cues.   Visuospatial Function: Visuospatial and constructional functioning was low average on the RBANS v/s index. Copy of a line drawing was average. He had a harder time with judgment of angular line orientations, which was unusually low. It is difficult to tell if this is related to actual  visuospatial problems vs. Confusion regarding test task demands.   Learning and Memory: Performance on measures of learning and memory was notably and significantly impaired. Specifically, Mr. Tijerina had a hard time learning and retaining information across time and this was noted for both verbal and visual information. Recognition cuing did not appreciably benefit his performance. These findings are concerning for a possible emerging memory storage problem.   In the verbal realm, Mr. Nest learned 4, 4, 5, and 5 words of a 12-item word list across three learning trials followed by words at long delayed recall, which are unusually low and extremely low scores, respectively. His immediate recall for a brief short story was extremely low and that was followed by extremely low delayed recall (recalled virtually no information). His delayed recognition for the word list versus false choices was extremely low although a bit better than chance levels.   In the visual realm, delayed recall for a modestly complex geometric figure was unusually low.   Executive Functions: Performance on executive indicators was generally within normal limits, suggesting intact capacities. He performed toward the upper portion of the average range on the Modified [employer], a measure of concept formation and cognitive flexibility. His alternating sequencing of numbers and letters of the alphabet was high average. Generation of words in response to given letters was average. Reasoning with verbal information was errorless on the [employer]. Clock drawing was suggestive of "borderline impairment," with minor errors in numbers and no size difference between the clock hands.   Rating Scale(s): Mr. Brutus screened in the moderately severe range for depressive symptoms and in the severe range for anxiety symptoms. While many of the symptoms he endorsed were physical in nature and thus could be  secondary to other health problems, he stated that he has felt bad about himself or as though he is a failure more than half the days over the past two weeks and also that he has been bothered by feeling down, depressed, or hopeless several days.   Viviano Simas Nicole Kindred, PsyD, ABN Clinical Neuropsychologist  Coding and Compliance  Billing below reflects technician time, my direct face-to-face time with the patient, time spent in test administration, and time spent in professional activities including but not limited to: neuropsychological test interpretation, integration of neuropsychological test data with clinical history, report preparation, treatment planning, care coordination, and review of diagnostically pertinent medical history or studies.   Services associated with this encounter: Clinical Interview [phone]) plus 170 minutes (96132/96133; Neuropsychological Evaluation by Professional)  21 minutes (96136/96137; Test Administration by Professional) 120 minutes ([id number]/[id number]; Neuropsychological Testing by Technician)

## 2020-11-09 ENCOUNTER — Ambulatory Visit: Payer: Medicare Other | Admitting: Occupational Therapy

## 2020-11-10 ENCOUNTER — Other Ambulatory Visit: Payer: Self-pay

## 2020-11-10 ENCOUNTER — Ambulatory Visit: Payer: Medicare Other | Admitting: Occupational Therapy

## 2020-11-10 DIAGNOSIS — M25632 Stiffness of left wrist, not elsewhere classified: Secondary | ICD-10-CM | POA: Diagnosis not present

## 2020-11-10 DIAGNOSIS — M6281 Muscle weakness (generalized): Secondary | ICD-10-CM

## 2020-11-10 DIAGNOSIS — M25642 Stiffness of left hand, not elsewhere classified: Secondary | ICD-10-CM | POA: Diagnosis not present

## 2020-11-10 DIAGNOSIS — R296 Repeated falls: Secondary | ICD-10-CM | POA: Diagnosis not present

## 2020-11-11 ENCOUNTER — Other Ambulatory Visit (HOSPITAL_COMMUNITY): Payer: Self-pay | Admitting: Internal Medicine

## 2020-11-11 ENCOUNTER — Encounter: Payer: Medicare HMO | Admitting: Counselor

## 2020-11-11 ENCOUNTER — Other Ambulatory Visit: Payer: Self-pay | Admitting: Physician Assistant

## 2020-11-11 DIAGNOSIS — I482 Chronic atrial fibrillation, unspecified: Secondary | ICD-10-CM
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
## 2020-11-12 ENCOUNTER — Telehealth: Payer: Self-pay | Admitting: Adult Health

## 2020-11-12 DIAGNOSIS — Z794 Long term (current) use of insulin: Secondary | ICD-10-CM

## 2020-11-12 MED ORDER — EMPAGLIFLOZIN 10 MG PO TABS: 10.0000 mg | ORAL_TABLET | Freq: Every day | ORAL | 0 refills | Status: DC

## 2020-11-12 MED ORDER — METOPROLOL SUCCINATE ER 50 MG PO TB24: 50.0000 mg | ORAL_TABLET | Freq: Every day | ORAL | 3 refills | Status: DC

## 2020-11-12 NOTE — Telephone Encounter (Signed)
Rx refilled. Pt spouse notified of update

## 2020-11-12 NOTE — Telephone Encounter (Signed)
Pt needs a refill on Jardiance 10 mg.  Patient is completely out.    Pharmacy-- [facility]

## 2020-11-15 ENCOUNTER — Ambulatory Visit: Payer: Medicare Other | Admitting: Occupational Therapy

## 2020-11-16 ENCOUNTER — Other Ambulatory Visit: Payer: Self-pay

## 2020-11-16 ENCOUNTER — Ambulatory Visit: Payer: Medicare Other | Admitting: Occupational Therapy

## 2020-11-16 ENCOUNTER — Encounter: Payer: Self-pay | Admitting: Occupational Therapy

## 2020-11-16 DIAGNOSIS — M25632 Stiffness of left wrist, not elsewhere classified: Secondary | ICD-10-CM

## 2020-11-16 DIAGNOSIS — M6281 Muscle weakness (generalized): Secondary | ICD-10-CM | POA: Diagnosis not present

## 2020-11-16 DIAGNOSIS — R296 Repeated falls: Secondary | ICD-10-CM | POA: Diagnosis not present

## 2020-11-16 DIAGNOSIS — M25642 Stiffness of left hand, not elsewhere classified: Secondary | ICD-10-CM
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
## 2020-11-17 ENCOUNTER — Ambulatory Visit: Payer: Medicare Other | Admitting: Dermatology

## 2020-11-18 ENCOUNTER — Other Ambulatory Visit: Payer: Self-pay | Admitting: Internal Medicine

## 2020-11-23 ENCOUNTER — Other Ambulatory Visit (HOSPITAL_COMMUNITY): Payer: Self-pay

## 2020-11-23 ENCOUNTER — Telehealth: Payer: Self-pay | Admitting: Neurology

## 2020-11-23 ENCOUNTER — Telehealth (HOSPITAL_COMMUNITY): Payer: Self-pay

## 2020-11-23 ENCOUNTER — Telehealth (HOSPITAL_COMMUNITY): Payer: Self-pay | Admitting: *Deleted

## 2020-11-23 NOTE — Telephone Encounter (Signed)
Received notification from [facility] Part D regarding a prior authorization for BiDil. Authorization has been APPROVED from 11/23/20 to 11/23/21.   Authorization # [facility]  Ran test claim, received a refill too soon. Called pharmacy, generic was filled for patient on 11/11/20. Called patient and left voicemail to advise.

## 2020-11-23 NOTE — Telephone Encounter (Signed)
Submitted a Prior Authorization request to [place] [employer] for  BiDil  via CoverMyMeds. Will update once we receive a response.   Key: [id number]

## 2020-11-23 NOTE — Telephone Encounter (Signed)
Received VM from pharmacy that pt needed PA for medication. VM routed to pharmacy VM line.

## 2020-11-23 NOTE — Telephone Encounter (Signed)
Received call from pt's wife that PA for isosorbide (BiDil) needed. Noted recent insurance change. Will forward to patient advocate to begin PA.

## 2020-11-23 NOTE — Telephone Encounter (Signed)
Pt wife called stating we should be recving  a PA for vimpat. They have a 4 wk supply. She wanted to give you a heads [phone]

## 2020-11-24 ENCOUNTER — Other Ambulatory Visit: Payer: Self-pay

## 2020-11-24 ENCOUNTER — Encounter: Payer: Self-pay | Admitting: Occupational Therapy

## 2020-11-24 ENCOUNTER — Ambulatory Visit: Payer: Medicare Other | Attending: Adult Health | Admitting: Occupational Therapy

## 2020-11-24 DIAGNOSIS — M25632 Stiffness of left wrist, not elsewhere classified: Secondary | ICD-10-CM | POA: Diagnosis not present

## 2020-11-24 DIAGNOSIS — M6281 Muscle weakness (generalized): Secondary | ICD-10-CM | POA: Insufficient documentation

## 2020-11-24 DIAGNOSIS — M25642 Stiffness of left hand, not elsewhere classified: Secondary | ICD-10-CM

## 2020-11-24 NOTE — Patient Instructions (Signed)
Access Code: [email or web address] URL: [email or web address] Date: 11/24/2020 Prepared by: Merleen Nicely, OTR/L  Exercises Seated Wrist Flexion with Overpressure - 2-3 x daily - 5 reps - 3-5 hold Hand PROM MCP Flexion - 2-3 x daily - 5 reps - 3 hold Hand PROM Finger Extension - 2-3 x daily - 1 sets - 5 reps - 5 hold Seated Finger MP Extension PROM - 2-3 x weekly - 2 sets - 10 reps Seated Finger MP Extension AROM with Blocking - 2-3 x daily - 2 sets - 10 reps
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
## 2020-11-26 NOTE — Telephone Encounter (Signed)
Pa was sent.

## 2020-12-01 ENCOUNTER — Ambulatory Visit: Payer: Medicare Other | Admitting: Occupational Therapy

## 2020-12-01 ENCOUNTER — Other Ambulatory Visit: Payer: Self-pay

## 2020-12-01 ENCOUNTER — Telehealth: Payer: Self-pay

## 2020-12-01 DIAGNOSIS — M25642 Stiffness of left hand, not elsewhere classified: Secondary | ICD-10-CM | POA: Diagnosis not present

## 2020-12-01 DIAGNOSIS — M6281 Muscle weakness (generalized): Secondary | ICD-10-CM

## 2020-12-01 DIAGNOSIS — M25632 Stiffness of left wrist, not elsewhere classified: Secondary | ICD-10-CM | POA: Diagnosis not present

## 2020-12-01 NOTE — Telephone Encounter (Signed)
[name] (Key: [id number]) Vimpat 100MG  tablets   Form [employer] [place] [employer] Part [place] Electronic Request Form (CB  Waiting for determination

## 2020-12-01 NOTE — Telephone Encounter (Signed)
Spoke with pt wife she stated that [name] did have a reaction to generic vimpat he has to use the name brand. She stated that the insurance said he needed a PA to stay on the name brand,  we have a letter that stated they would not cover it, pt wife is going to call her insurance to see what is going, on we will start a PA for vimpat. We may need a letter or a peer to peer to get it covered but will start with another PA in cover my meds,

## 2020-12-02 ENCOUNTER — Encounter: Payer: Self-pay | Admitting: Adult Health
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
## 2020-12-07 NOTE — Telephone Encounter (Signed)
Vimpat 100mg  approved coverage for non formulatry exception request for vimpat 100mg  the authorization for the requested drug will end on 11/23/2021 as long as he remains enrolled. Blue cross blue shield

## 2020-12-13 ENCOUNTER — Telehealth: Payer: Self-pay
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
## 2020-12-16 ENCOUNTER — Telehealth: Payer: Self-pay | Admitting: Pharmacist

## 2020-12-16 NOTE — Chronic Care Management (AMB) (Signed)
Chronic Care Management Pharmacy Assistant   Name: [name]  MRN: [id number] DOB: 07-14-1945  Reason for Encounter: Disease State Hypertension Assessment Call   Conditions to be addressed/monitored: HTN  Recent office visits:  11-02-2020 Eulas Post, MD (PCP off) - Patient presented for Allergic rash. Prescribed triamcinolone cream.  10-19-2020 Dorothyann Peng, NP (PCP) - Patient presented for power mobility device assessment. No medication changes.  Recent consult visits:  11-03-2020 Alphonzo Severance ( Psych) - Patient presented for Neurocognitive disorder and other concerns. No medication changes.  10-04-2020 Marzetta Board, DPM (Podiatry) - Patient presented for Pain due to onychomycosis of toenail and other concerns. No medication changes.  10-01-2020 [name], MD (Cardiology) - Patient presented for Chronic systolic CHF and other issues. No medication changes.  Hospital visits:  Medication Reconciliation was completed by comparing discharge summary, patient's EMR and Pharmacy list, and upon discussion with patient.  Patient visited [facility] on 09-23-2020 due to ICD Generator Change out. Patient was there for 4 hours.  New?Medications Started at [facility] Discharge:?? -started None  Medication Changes at Hospital Discharge: -Changed None  Medications Discontinued at Hospital Discharge: -Stopped None  Medications that remain the same after Hospital Discharge:??  -All other medications will remain the same.    Medications: Outpatient Encounter Medications as of 12/16/2020  Medication Sig   SPIRIVA RESPIMAT 2.5 MCG/ACT AERS INHALE 2 PUFFS INTO THE LUNGS DAILY.   albuterol (VENTOLIN HFA) 108 (90 Base) MCG/ACT inhaler Inhale 1-2 puffs into the lungs every 4 (four) hours as needed for wheezing or shortness of breath (or cough).   BIDIL 20-37.5 MG tablet TAKE 2 TABLETS BY MOUTH 2 (TWO) TIMES DAILY.   Blood Glucose Monitoring Suppl (ONETOUCH VERIO  FLEX SYSTEM) w/Device KIT USE TO TEST BLOOD GLUCOSE THREE TIMES DAILY   Blood Pressure Monitoring (BLOOD PRESSURE MONITOR/M CUFF) MISC Upper arm blood pressure monitor   EASY COMFORT PEN NEEDLES 31G X 5 MM MISC USE DAILY AT BEDTIME.   ELIQUIS 5 MG TABS tablet TAKE 1 TABLET BY MOUTH 2 (TWO) TIMES DAILY (AM+BEDTIME)   empagliflozin (JARDIANCE) 10 MG TABS tablet Take 1 tablet (10 mg total) by mouth daily before breakfast.   ENTRESTO 97-103 MG TAKE 1 TABLET BY MOUTH 2 (TWO) TIMES DAILY.   fenofibrate 160 MG tablet TAKE 1 TABLET BY MOUTH EVERY EVENING.   finasteride (PROSCAR) 5 MG tablet TAKE ONE TABLET BY MOUTH ONCE DAILY   FLUoxetine (PROZAC) 10 MG capsule Take 1 capsule (10 mg total) by mouth daily.   Lancets (ONETOUCH DELICA PLUS [id number]) MISC USE TO TEST BLOOD SUGAR THREE TIMES PER DAY   Lancets (ONETOUCH ULTRASOFT) lancets Use as instructed   levocetirizine (XYZAL) 5 MG tablet TAKE 1 TABLET (5 MG TOTAL) BY MOUTH EVERY EVENING.   memantine (NAMENDA) 10 MG tablet Take 1 tablet (10 mg total) by mouth 2 (two) times daily.   metoprolol succinate (TOPROL-XL) 25 MG 24 hr tablet TAKE 1 TABLET (25 MG TOTAL) BY MOUTH EVERY MORNING (AM)   metoprolol succinate (TOPROL-XL) 50 MG 24 hr tablet TAKE 1 TABLET (50 MG TOTAL) BY MOUTH EVERY EVENING. TAKE WITH OR IMMEDIATELY FOLLOWING A MEAL (BEDTIME)   metoprolol succinate (TOPROL-XL) 50 MG 24 hr tablet Take 1 tablet (50 mg total) by mouth at bedtime. TAKE WITH OR IMMEDIATELY FOLLOWING A MEAL.   mirabegron ER (MYRBETRIQ) 25 MG TB24 tablet Take 25 mg by mouth daily.    nitroGLYCERIN (NITROSTAT) 0.4 MG SL tablet Place 1 tablet (0.4  mg total) under the tongue every 5 (five) minutes as needed for chest pain.   ONETOUCH VERIO test strip USE TO TEST BLOOD GLUCOSE THREE TIMES A DAY MEDICALLY NECESSARY   ranolazine (RANEXA) 500 MG 12 hr tablet TAKE 1 TABLET (500 MG TOTAL) BY MOUTH DAILY.   rosuvastatin (CRESTOR) 40 MG tablet TAKE 1 TABLET (40 MG TOTAL) BY MOUTH EVERY  EVENING (BEDTIME)   sertraline (ZOLOFT) 25 MG tablet Take 1 tablet (25 mg total) by mouth daily. (Patient taking differently: Take 12.5 mg by mouth daily.)   sotalol (BETAPACE) 80 MG tablet TAKE 1 TABLET (80 MG TOTAL) BY MOUTH DAILY.   tamsulosin (FLOMAX) 0.4 MG CAPS capsule Take 0.4 mg by mouth at bedtime.    triamcinolone cream (KENALOG) 0.1 % Apply 1 application topically 2 (two) times daily.   VIMPAT 100 MG TABS Take 1 tablet twice a day   No facility-administered encounter medications on file as of 12/16/2020.   Reviewed chart prior to disease state call. Spoke with patient regarding BP  Recent Office Vitals: BP Readings from Last 3 Encounters:  11/02/20 120/72  10/19/20 120/62  10/01/20 112/60   Pulse Readings from Last 3 Encounters:  11/02/20 65  10/19/20 (!) 56  10/01/20 63    Wt Readings from Last 3 Encounters:  11/02/20 177 lb 9.6 oz (80.6 kg)  10/19/20 180 lb (81.6 kg)  10/01/20 177 lb 6.4 oz (80.5 kg)     Kidney Function Lab Results  Component Value Date/Time   CREATININE 1.7 (A) 10/20/2020 12:00 AM   CREATININE 1.72 (H) 09/21/2020 11:23 AM   CREATININE 1.73 (H) 08/31/2020 08:43 AM   CREATININE 1.86 (H) 01/26/2016 11:44 AM   CREATININE 1.72 (H) 07/19/2015 03:51 PM   GFR 42.52 (L) 08/01/2019 11:25 AM   GFRNONAA 45 (L) 06/11/2020 10:14 AM   GFRAA 41 10/20/2020 12:00 AM    BMP Latest Ref Rng & Units 10/20/2020 09/21/2020 08/31/2020  Glucose 65 - 99 mg/dL - 163(H) 152(H)  BUN 8 - 27 mg/dL - 22 23  Creatinine 0.6 - 1.3 1.7(A) 1.72(H) 1.73(H)  BUN/Creat Ratio 10 - 24 - 13 13  Sodium 137 - 147 143 139 139  Potassium 3.4 - 5.3 4.4 4.6 4.8  Chloride 99 - 108 105 103 102  CO2 13 - 22 27(A) 28 24  Calcium 8.7 - 10.7 10.4 9.7 9.5    Current antihypertensive regimen:  Metoprolol 10m, 1 tablet once daily Metoprolol 222m 1 tablet once daily  Notes:   12-17-2020 3rd attempt to reach patient unsuccessful   Fill History Gaps :   JARDIANCE 10 MG TABLET 07/27/2020 90    rosuvastatin (CRESTOR) tablet 10/12/2020 30    Care Gaps:  CCM F/U call - Scheduled for 12/20/2020 at 8:30 AWV - Scheduled for 02/18/2021 at 11:15 Zoster vaccine - Overdue Foot exam- Complete COVID Booster #4 (Moderna) - Overdue Colonoscopy - Overdue  Star Rating Drugs:  Jardiance 1047m Last filled 07-27-2020 [facility]DS [facility] 10-12-2020 30DS as [facility] Pharmacist Assistant [phone]

## 2020-12-17 ENCOUNTER — Telehealth: Payer: Self-pay | Admitting: Pharmacist

## 2020-12-17 NOTE — Progress Notes (Signed)
    Chronic Care Management Pharmacy Assistant   Name: Lochlan Walterscheid  MRN: [id number] DOB: November 06, 1945  12-17-2020- Patient called to remind of appointment with Jeni Salles via phone call on 12-20-2020 at 8:30   No answer, left message of appointment date, time and type of appointment (either telephone or in person). Left message to have all medications, supplements, blood pressure and/or blood sugar logs available during appointment and to return call if need to reschedule.  Care Gaps:  CCM F/U call - Scheduled for 12/20/2020 at 8:30 AWV - Scheduled for 02/18/2021 at 11:15 Zoster vaccine - Overdue Foot exam- Complete COVID Booster #4 (Moderna) - Overdue Colonoscopy - Overdue Star Rating Drug: Jardiance '10mg'$  - Last filled 07-27-2020 90DS at [facility] Rosuvastatin '40mg'$  - Last filled 10-12-2020 30DS as [place] Pharmacist Assistant [phone]

## 2020-12-20 ENCOUNTER — Ambulatory Visit: Payer: Medicare HMO | Admitting: Pharmacist

## 2020-12-20 DIAGNOSIS — I480 Paroxysmal atrial fibrillation: Secondary | ICD-10-CM | POA: Diagnosis not present

## 2020-12-20 DIAGNOSIS — R413 Other amnesia: Secondary | ICD-10-CM

## 2020-12-20 NOTE — Progress Notes (Signed)
Chronic Care Management Pharmacy Note  01/05/2021 Name:  [name] MRN:  [id number] DOB:  09-05-1945  Summary: Pt has been irritable and noncompliant with medications  Recommendations/Changes made from today's visit: -Recommended genetic testing to determine best option for SSRI/SNRI therapy as patient did not tolerate 2 SSRIs  Plan: Follow up in 6 months  Subjective: [name] is an 75 y.o. year old male who is a primary patient of Dorothyann Peng, NP.  The CCM team was consulted for assistance with disease management and care coordination needs.    Engaged with patient by telephone for follow up visit in response to provider referral for pharmacy case management and/or care coordination services.   Consent to Services:  The patient was given information about Chronic Care Management services, agreed to services, and gave verbal consent prior to initiation of services.  Please see initial visit note for detailed documentation.   Patient Care Team: Dorothyann Peng, NP as PCP - General (Family Medicine) Bensimhon, Shaune Pascal, MD as PCP - Cardiology (Cardiology) [name], MD as PCP - Electrophysiology (Cardiology) Edrick Oh, MD as Consulting Physician (Nephrology) Cameron Sprang, MD as Consulting Physician (Neurology) Viona Gilmore, [facility] as Pharmacist (Pharmacist) Lavonna Monarch, MD as Consulting Physician (Dermatology)  Recent office visits: 08/10/20 Carolann Littler, MD: Patient presented for diabetes follow up. Patient required physician visit for diabetic shoes evaluation.   07/27/20 Dorothyann Peng, NP: Patient presented for diabetes follow up. A1c controlled at 6.6.  04/27/20 Dorothyann Peng, NP: Patient presented for diabetes follow up. A1c increased to 6.9%. Referral placed for ENT. Prescribed doxycycline 100 mg BID.   04/22/20 Colin Benton, DO: Patient presented for video visit with cough. Prescribed benzonatate PRN.  06/11/20 [name], MD (cardiology):  Patient presented for CHF and CAD follow up.    05/26/20 Lamar Blinks, MD (fam med): Patient presented for acute telephone visit for acute bronchitis with COPD. Prescribed doxycycline BID.   05/17/20 Tommye Standard, PA-C (cardiology): Patient presented for CHF and CAD follow up. Restarted Ranexa 500 mg daily.   Recent consult visits: 12/01/20 Kathrine Cords, OT (outpatient rehab): Patient presented for OT treatment of wrist.  11/03/20 Milana Kidney (psychology/neurology): Patient presented for cognitive testing and office visit with Dr. Nicole Kindred.  11/02/20 Carolann Littler, MD: Patient presented for rash. Consider Zyrtec daily. Administered depo-medrol injection and prescribed triamcinolone cream BID.  08/25/20 Acquanetta Sit, DPM (podiatry): Patient presented for nail trim.  08/18/20 Joesph July, PA-C (cardiology): Patient presented for HF follow up. Plan for ICD change in May.  08/12/20 Ellouise Newer, MD (neurology): Patient presented for seizure follow up. Prescribed sertraline 25 mg daily for mood.  07/29/20 Acquanetta Sit, DPM (podiatry): Patient presented for diabetic shoe inserts evaluation.  07/27/20 Sherrilyn Rist, MD (pulmonary): Patient presented for sleep apnea evaluation. No changes made.  06/23/20 Brand Males, MD (pulmonary disease): Patient presented for emphysema follow up. No changes made.   Hospital visits: None in previous 6 months  Objective:  Lab Results  Component Value Date   CREATININE 1.40 (H) 01/03/2021   BUN 16 01/03/2021   GFR 42.52 (L) 08/01/2019   GFRNONAA 45 (L) 06/11/2020   GFRAA 41 10/20/2020   NA 142 01/03/2021   K 3.9 01/03/2021   CALCIUM 9.3 01/03/2021   CO2 25 01/03/2021   GLUCOSE 98 01/03/2021    Lab Results  Component Value Date/Time   HGBA1C 6.6 (A) 07/27/2020 09:45 AM   HGBA1C 6.9 (A) 04/27/2020 10:18 AM   HGBA1C 6.2 10/29/2019 08:12 AM  HGBA1C 7.1 (H) 08/01/2019 11:25 AM   HGBA1C 7.5 (H) 04/15/2019 01:52 PM   HGBA1C 7.8  01/04/2018 02:18 PM   GFR 42.52 (L) 08/01/2019 11:25 AM   GFR 56.87 (L) 04/15/2019 01:52 PM   MICROALBUR <0.7 08/01/2019 11:25 AM    Last diabetic Eye exam:  Lab Results  Component Value Date/Time   HMDIABEYEEXA No Retinopathy 05/24/2020 07:38 AM    Last diabetic Foot exam: No results found for: HMDIABFOOTEX   Lab Results  Component Value Date   CHOL 115 08/01/2019   HDL 27.30 (L) 08/01/2019   LDLCALC 61 08/01/2019   TRIG 134.0 08/01/2019   CHOLHDL 4 08/01/2019    Hepatic Function Latest Ref Rng & Units 10/20/2020 08/13/2019 08/01/2019  Total Protein 6.5 - 8.1 g/dL - 7.5 7.0  Albumin 3.5 - 5.0 4.5 4.6 4.3  AST 15 - 41 U/L - 24 22  ALT 0 - 44 U/L - 20 20  Alk Phosphatase 38 - 126 U/L - 38 50  Total Bilirubin 0.3 - 1.2 mg/dL - 0.7 0.4  Bilirubin, Direct 0.00 - 0.40 mg/dL - - -    Lab Results  Component Value Date/Time   TSH 2.270 08/18/2020 11:15 AM   TSH 1.28 08/01/2019 11:25 AM   FREET4 0.90 04/20/2011 12:08 PM   FREET4 0.92 04/27/2010 11:39 AM    CBC Latest Ref Rng & Units 01/03/2021 10/20/2020 09/21/2020  WBC 3.4 - 10.8 x10E3/uL 6.0 - 6.2  Hemoglobin 13.0 - 17.7 g/dL 14.9 14.6 14.4  Hematocrit 37.5 - 51.0 % 44.4 - 42.6  Platelets 150 - 450 x10E3/uL 214 - 239    Lab Results  Component Value Date/Time   VD25OH 41.5 09/06/2015 10:55 AM    Clinical ASCVD: Yes  The ASCVD Risk score (Goff DC Jr., et al., 2013) failed to calculate for the following reasons:   The patient has a prior MI or stroke diagnosis    Depression screen [facility] 2/9 02/18/2020 07/18/2018  Decreased Interest 0 0  Down, Depressed, Hopeless 1 0  PHQ - 2 Score 1 0  Altered sleeping 0 -  Tired, decreased energy 1 -  Change in appetite 3 -  Feeling bad or failure about yourself  3 -  Trouble concentrating 0 -  Moving slowly or fidgety/restless 3 -  Suicidal thoughts 0 -  PHQ-9 Score 11 -  Difficult doing work/chores Very difficult -  Some recent data might be hidden     CAT Score 06/23/2020  Total CAT  Score 4    CHA2DS2/VAS Stroke Risk Points  Current as of 29 minutes ago     7 >= 2 Points: High Risk  1 - 1.99 Points: Medium Risk  0 Points: Low Risk    Last Change: N/A      Details    This score determines the patient's risk of having a stroke if the  patient has atrial fibrillation.       Points Metrics  1 Has Congestive Heart Failure:  Yes    Current as of 29 minutes ago  1 Has Vascular Disease:  Yes    Current as of 29 minutes ago  1 Has Hypertension:  Yes    Current as of 29 minutes ago  1 Age:  82    Current as of 29 minutes ago  1 Has Diabetes:  Yes    Current as of 29 minutes ago  2 Had Stroke:  Yes  Had TIA:  No  Had Thromboembolism:  No  Current as of 29 minutes ago  0 Male:  No    Current as of 29 minutes ago         Social History   Tobacco Use  Smoking Status Former   Packs/day: 2.00   Years: 23.00   Pack years: 46.00   Types: Cigarettes   Start date: 08/21/1961   Quit date: 01/03/1985   Years since quitting: 36.0  Smokeless Tobacco Never   BP Readings from Last 3 Encounters:  01/03/21 118/70  11/02/20 120/72  10/19/20 120/62   Pulse Readings from Last 3 Encounters:  01/03/21 (!) 55  11/02/20 65  10/19/20 (!) 56   Wt Readings from Last 3 Encounters:  01/03/21 181 lb 12.8 oz (82.5 kg)  11/02/20 177 lb 9.6 oz (80.6 kg)  10/19/20 180 lb (81.6 kg)   BMI Readings from Last 3 Encounters:  01/03/21 26.85 kg/m  11/02/20 26.23 kg/m  10/19/20 26.58 kg/m    Assessment/Interventions: Review of patient past medical history, allergies, medications, health status, including review of consultants reports, laboratory and other test data, was performed as part of comprehensive evaluation and provision of chronic care management services.   SDOH:  (Social Determinants of Health) assessments and interventions performed: No  SDOH Screenings   Alcohol Screen: Low Risk    Last Alcohol Screening Score (AUDIT): 0  Depression (PHQ2-9): Medium Risk    PHQ-2 Score: 11  Financial Resource Strain: Not on file  Food Insecurity: Food Insecurity Present   Worried About Charity fundraiser in the Last Year: Sometimes true   Ran Out of Food in the Last Year: Sometimes true  Housing: Low Risk    Last Housing Risk Score: 0  Physical Activity: Sufficiently Active   Days of Exercise per Week: 7 days   Minutes of Exercise per Session: 30 min  Social Connections: Moderately Integrated   Frequency of Communication with Friends and Family: More than three times a week   Frequency of Social Gatherings with Friends and Family: More than three times a week   Attends Religious Services: More than 4 times per year   Active Member of [employer] or Organizations: No   Attends Archivist Meetings: Never   Marital Status: Married  Stress: Not on file  Tobacco Use: Medium Risk   Smoking Tobacco Use: Former   Smokeless Tobacco Use: Never  Transportation Needs: No Data processing manager (Medical): No   Lack of Transportation (Non-Medical): No    CCM Care Plan  Allergies  Allergen Reactions   Aricept [Donepezil Hcl] Other (See Comments)    Worsens renal function   Codeine Hives   Nexium [Esomeprazole] Other (See Comments)    Severely worsens renal function   Omeprazole Hives   Pantoprazole Sodium Other (See Comments)    Renal failure   Tramadol Nausea And Vomiting    Medications Reviewed Today     Reviewed by Rose Phi, CMA (Certified Medical Assistant) on 01/03/21 at 1604  Med List Status: <None>   Medication Order Taking? Sig Documenting Provider Last Dose Status Informant  albuterol (VENTOLIN HFA) 108 (90 Base) MCG/ACT inhaler [id number] Yes Inhale 1-2 puffs into the lungs every 4 (four) hours as needed for wheezing or shortness of breath (or cough). Brand Males, MD Taking Active Spouse/Significant Other  BIDIL 20-37.5 MG tablet [id number] Yes TAKE 2 TABLETS BY MOUTH 2 (TWO) TIMES DAILY. Bensimhon,  Shaune Pascal, MD Taking Active Spouse/Significant Other  Blood Glucose Monitoring Suppl [facility]  [place]) w/Device KIT [id number] Yes USE TO TEST BLOOD GLUCOSE THREE TIMES DAILY Nafziger, Tommi Rumps, NP Taking Active Spouse/Significant Other  Blood Pressure Monitoring (BLOOD PRESSURE MONITOR/M CUFF) MISC [id number] Yes Upper arm blood pressure monitor Nafziger, Tommi Rumps, NP Taking Active Spouse/Significant Other  EASY COMFORT PEN NEEDLES 31G X 5 MM MISC [id number] Yes USE DAILY AT BEDTIME. Nafziger, Tommi Rumps, NP Taking Active Spouse/Significant Other  ELIQUIS 5 MG TABS tablet [id number] Yes TAKE 1 TABLET BY MOUTH 2 (TWO) TIMES DAILY (AM+BEDTIME) Bensimhon, Shaune Pascal, MD Taking Active   empagliflozin (JARDIANCE) 10 MG TABS tablet [id number] Yes Take 1 tablet (10 mg total) by mouth daily before breakfast. Dorothyann Peng, NP Taking Active   ENTRESTO 97-103 MG [id number] Yes TAKE 1 TABLET BY MOUTH 2 (TWO) TIMES DAILY. Bensimhon, Shaune Pascal, MD Taking Active Spouse/Significant Other  fenofibrate 160 MG tablet [id number] Yes TAKE 1 TABLET BY MOUTH EVERY EVENING. Bensimhon, Shaune Pascal, MD Taking Active Spouse/Significant Other  finasteride (PROSCAR) 5 MG tablet [id number] Yes TAKE ONE TABLET BY MOUTH ONCE DAILY Bensimhon, Shaune Pascal, MD Taking Active Spouse/Significant Other  Lancets [facility] DELICA PLUS [id number]) MISC [id number] Yes USE TO TEST BLOOD SUGAR THREE TIMES PER DAY Dorothyann Peng, NP Taking Active Spouse/Significant Other  Lancets (ONETOUCH ULTRASOFT) lancets [id number] Yes Use as instructed Nafziger, Tommi Rumps, NP Taking Active Spouse/Significant Other  levocetirizine (XYZAL) 5 MG tablet [id number] Yes TAKE 1 TABLET (5 MG TOTAL) BY MOUTH EVERY EVENING (BEDTIME) Brand Males, MD Taking Active   memantine (NAMENDA) 10 MG tablet [id number] Yes Take 1 tablet (10 mg total) by mouth 2 (two) times daily. Cameron Sprang, MD Taking Active Spouse/Significant Other  metoprolol succinate (TOPROL-XL) 25 MG 24 hr tablet [id number] Yes  TAKE 1 TABLET (25 MG TOTAL) BY MOUTH EVERY MORNING (AM) Allred, Jeneen Rinks, MD Taking Active   metoprolol succinate (TOPROL-XL) 50 MG 24 hr tablet [id number] Yes TAKE 1 TABLET (50 MG TOTAL) BY MOUTH EVERY EVENING. TAKE WITH OR IMMEDIATELY FOLLOWING A MEAL (BEDTIME) Bensimhon, Shaune Pascal, MD Taking Active   metoprolol succinate (TOPROL-XL) 50 MG 24 hr tablet [id number] Yes Take 1 tablet (50 mg total) by mouth at bedtime. TAKE WITH OR IMMEDIATELY FOLLOWING A MEAL. [name], MD Taking Active   mirabegron ER [facility]) 25 MG TB24 tablet [id number] Yes Take 25 mg by mouth daily.  [provider] Taking Active Spouse/Significant Other  nitroGLYCERIN (NITROSTAT) 0.4 MG SL tablet [id number] Yes Place 1 tablet (0.4 mg total) under the tongue every 5 (five) minutes as needed for chest pain. Bensimhon, Shaune Pascal, MD Taking Active Spouse/Significant Other  Roma Schanz test strip [id number] Yes USE TO TEST BLOOD GLUCOSE THREE TIMES A DAY MEDICALLY NECESSARY Nafziger, Tommi Rumps, NP Taking Active Spouse/Significant Other  ranolazine (RANEXA) 500 MG 12 hr tablet [id number] Yes TAKE 1 TABLET (500 MG TOTAL) BY MOUTH DAILY. [name], MD Taking Active   rosuvastatin (CRESTOR) 40 MG tablet [id number] Yes TAKE 1 TABLET (40 MG TOTAL) BY MOUTH EVERY EVENING (BEDTIME) Bensimhon, Shaune Pascal, MD Taking Active   sotalol (BETAPACE) 80 MG tablet [id number] Yes TAKE 1 TABLET (80 MG TOTAL) BY MOUTH DAILY. Bensimhon, Shaune Pascal, MD Taking Active   SPIRIVA RESPIMAT 2.5 MCG/ACT AERS [id number] Yes INHALE 2 PUFFS INTO THE LUNGS DAILY. Brand Males, MD Taking Active   tamsulosin [facility]) 0.4 MG CAPS capsule [id number] Yes Take 0.4 mg by mouth at bedtime.  [provider] Taking Active Spouse/Significant Other  triamcinolone cream (KENALOG) 0.1 % [id number] Yes Apply 1 application topically 2 (two) times  daily. Eulas Post, MD Taking Active   VIMPAT 100 MG TABS [id number] Yes Take 1 tablet twice a day Cameron Sprang, MD  Taking Active   Med List Note Sharene Butters, [place] 12/25/16 1413): CPAP machine with sleep.            Patient Active Problem List   Diagnosis Date Noted   Memory loss 06/01/2018   Automatic implantable cardioverter-defibrillator in situ 01/24/2018   Type 2 diabetes mellitus with diabetic chronic kidney disease ([place]) 01/05/2018   DCM (dilated cardiomyopathy) ([place])    COPD (chronic obstructive pulmonary disease) ([place]) 10/01/2017   Tinnitus 04/13/2017   Localization-related symptomatic epilepsy and epileptic syndromes with complex partial seizures, not intractable, without status epilepticus ([place]) 07/03/2016   Precordial chest pain 02/29/2016   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[place]) 01/27/2016   Vitamin D deficiency 09/06/2015   Ischemic cardiomyopathy 09/06/2015   BPH (benign prostatic hyperplasia) 08/05/2015   Stiffness of hand joint 04/06/2014   NSTEMI (non-ST elevated myocardial infarction) ([place]) 02/03/2014   Pacemaker 02/03/2014   Lesion of radial nerve 10/14/2013   Spinal stenosis of cervical region 10/03/2013   Ulnar neuropathy at elbow of left upper extremity 10/03/2013   Seizure disorder ([place]) 03/02/2013   Esophageal reflux 02/19/2012   Imbalance 02/22/2011   CKD (chronic kidney disease), stage III ([place]) 02/22/2011   Ventricular tachycardia, sustained ([place]) 12/23/2009   Atrial fibrillation ([place]) 10/23/2007   Cerebral artery occlusion with cerebral infarction ([place]) 09/06/2007   Hearing loss 31/54/0086   Chronic systolic CHF (congestive heart failure) ([place]) 04/12/2007   Obstructive sleep apnea 04/05/2006   Mixed hyperlipidemia 04/05/2006   Coronary artery disease involving native coronary artery of native heart without angina pectoris 04/05/2006    Immunization History  Administered Date(s) Administered   Fluad Quad(high Dose 65+) 01/15/2019, 01/27/2020   Influenza Inj Mdck Quad Pf 01/15/2019, 01/27/2020   Influenza Split 02/22/2011   Influenza Whole 04/12/2007,  02/24/2008, 02/18/2009, 02/18/2010   Influenza, High Dose Seasonal PF 02/09/2016, 03/13/2017, 02/08/2018   Influenza,inj,Quad PF,6+ Mos 03/09/2017   Influenza,inj,quad, With Preservative 02/06/2012   Influenza-Unspecified 02/06/2012   Moderna Sars-Covid-2 Vaccination 07/05/2019, 08/02/2019, 04/02/2020   Pneumococcal Conjugate-13 01/27/2016   Pneumococcal Polysaccharide-23 02/24/2008, 02/08/2018   Patient's wife reports she that the patient is still missing doses of medications about twice a week and even switched patient to pill packaging. Setting alarms did not help and neither did incentives with money.  Patient is still irritable and wife would like to find a medication for this as he did not tolerate previous options. He had hives with fluoxetine and the rash went away as soon as he stopped the medication. Discussed the option for genetic testing to find alternatives.  Patient has been taking care of turkeys and chickens and gardening. He has also been going to a card playing on Saturdays at the community center and this has been really good for him.  Conditions to be addressed/monitored:  Hypertension, Hyperlipidemia, Diabetes, Atrial Fibrillation, Heart Failure, Coronary Artery Disease, COPD, Osteoarthritis, BPH and Seizures and Irritability  Conditions addressed this visit: Hypertension, hyperlipidemia, irritability  Care Plan : [place]  Updates made by Viona Gilmore, [place] since 01/05/2021 12:00 AM     Problem: Problem: Hypertension, Hyperlipidemia, Diabetes, Atrial Fibrillation, Heart Failure, Coronary Artery Disease, COPD, Osteoarthritis, BPH and Seizures and Irritability      Long-Range Goal: Patient-Specific Goal   Start Date: 09/21/2020  Expected End Date: 09/21/2021  Recent Progress: On track  Priority: High  Note:   Current Barriers:  Unable to independently monitor therapeutic efficacy  Pharmacist Clinical Goal(s):  Patient will achieve adherence to  monitoring guidelines and medication adherence to achieve therapeutic efficacy maintain control of diabetes as evidenced by home blood sugar monitoring and A1c  through collaboration with PharmD and provider.   Interventions: 1:1 collaboration with Dorothyann Peng, NP regarding development and update of comprehensive plan of care as evidenced by provider attestation and co-signature Inter-disciplinary care team collaboration (see longitudinal plan of care) Comprehensive medication review performed; medication list updated in electronic medical record  Hypertension (BP goal <140/90) -Controlled -Current treatment: Metoprolol 62m, 1 tablet once daily  Metoprolol 234m 1 tablet once daily -Medications previously tried: n/a  -Current home readings: 122/70 -Current dietary habits: not doing well lately -Current exercise habits: unable to do much exercise -Denies hypotensive/hypertensive symptoms -Educated on Daily salt intake goal < 2300 mg; Exercise goal of 150 minutes per week; Importance of home blood pressure monitoring; -Counseled to monitor BP at home weekly, document, and provide log at future appointments -Counseled on diet and exercise extensively Recommended to continue current medication  Hyperlipidemia/CAD: (LDL goal < 70) -Controlled -Current treatment: Rosuvastatin 4036m1 tablet once daily Fenofibrate 160m55m tablet every evening  -Medications previously tried: n/a  -Current dietary patterns: not great lately -Current exercise habits: unable to do much exercise -Educated on Cholesterol goals;  Exercise goal of 150 minutes per week; -Counseled on diet and exercise extensively Recommended to continue current medication  Ventricular tachycardia (Goal: maintain regular heart rate and rhythm) -Controlled -Current treatment  Sotalol 80mg79mtablet once daily Ranexa 500 mg daily -Medications previously tried: none  -Recommended to continue current  medication   Diabetes (A1c goal <7%) -Controlled -Current medications: Jardiance 10 mg 1 tablet daily -Medications previously tried: insulin (no longer needed) -Current home glucose readings fasting glucose: 136  post prandial glucose: n/a -Reports hypoglycemic/hyperglycemic symptoms -Current meal patterns:  breakfast: cereal with milk (8-9 am) lunch: sometimes skips dinner: eats at 5 pm snacks: klondike bars (not sugar free) drinks: did not discuss -Current exercise: limited -Educated on A1c and blood sugar goals; Benefits of routine self-monitoring of blood sugar; Carbohydrate counting and/or plate method -Counseled to check feet daily and get yearly eye exams -Counseled on diet and exercise extensively Recommended to continue current medication Recommended including lunch every day to avoid excessive late night snacking and choosing protein snacks such as nuts, cheese, or protein shakes   Atrial Fibrillation (Goal: prevent stroke and major bleeding) -Controlled -CHADSVASC: 7 -Current treatment: Rate control: Sotalol 80mg,26mablet once daily Anticoagulation: Eliquis 5 mg 1 tablet twice daily -Medications previously tried: n/a -Home BP and HR readings: 122/70  -Counseled on avoidance of NSAIDs due to increased bleeding risk with anticoagulants; -Recommended to continue current medication  Heart Failure (Goal: manage symptoms and prevent exacerbations) -Controlled -Last ejection fraction: 25-30% (Date: 07/21/2019) -HF type: Systolic -NYHA Class: II (slight limitation of activity) -AHA HF Stage: C (Heart disease and symptoms present) -Current treatment: Metoprolol 25mg, 81mblet once daily Metoprolol 50mg, 123mlet once daily  Bidil 20-37.5mg, 2 t53met twice times daily Entresto 97/103mg, 1 t76mt twice daily -Medications previously tried: spironolactone (gynecomastia) -Current home BP/HR readings: 122/70 (in office) -Current dietary habits: not great lately -Current  exercise habits: limited -Educated on Importance of weighing daily; if you gain more than 3 pounds in one day or 5 pounds in one week, call cardiologist Importance of blood pressure control -Recommended to continue current medication  COPD (Goal: control symptoms and prevent exacerbations) -Controlled -Current treatment  Albuterol HFA 124mg/act inhaler, 1 to 2 puffs every four hours as needed for wheezing or shortness of breath Spiriva Respimat 2.513m/ act inhaler, 2 puffs once daily -Medications previously tried: Symbicort  -Gold Grade: Gold 1 (FEV1>80%) -Current COPD Classification:  B (high sx, <2 exacerbations/yr) -MMRC/CAT score: 4 -Pulmonary function testing: n/a -Exacerbations requiring treatment in last 6 months: none -Patient reports consistent use of maintenance inhaler -Frequency of rescue inhaler use: as needed -Counseled on Benefits of consistent maintenance inhaler use -Recommended to continue current medication  Anger/irritability (Goal: minimize symptoms) -Uncontrolled -Current treatment: No medications -Medications previously tried/failed: sertraline (drowsiness/stumbling), fluoxetine (rash) -PHQ9: 11 -GAD7: n/a -Educated on Benefits of medication for symptom control -Educated on different side effects with SSRIs and activating nature vs not with certain medications Collaborated with Dr. AqDelice Leschbout genetic testing for alternative therapy.  Seizures (Goal: prevent seizures) -Controlled -Current treatment  Vimpat 1009m1 tablet twice daily  -Medications previously tried: n/a  -Recommended to continue current medication  BPH (Goal: minimize symptoms) -Controlled -Current treatment  Finasteride 5mg32m tablet once daily tamsulosin 0.4mg,85mcapsule once daily mirabegron ER 25mg,56mablet once daily -Medications previously tried: n/a  -Recommended to continue current medication  Memory loss (Goal: slow further progression of memory  loss) -Controlled -Current treatment  memantine 10mg, 83mblet twice daily  -Medications previously tried: donepezil (worsened renal function)  -Recommended to continue current medication  CKD Stage III (Goal: prevent kidney worsening) -Controlled -Current treatment  Jardiance 10 mg 1 tablet daily -Medications previously tried: none  -Recommended to continue current medication   Health Maintenance -Vaccine gaps: shingles, tetanus, COVID booster -Current therapy:  Levocetirizine 5 mg 1 tablet daily -Educated on Cost vs benefit of each product must be carefully weighed by individual consumer -Patient is satisfied with current therapy and denies issues -Recommended to continue current medication  Patient Goals/Self-Care Activities Patient will:  - take medications as prescribed check glucose weekly, document, and provide at future appointments check blood pressure weekly, document, and provide at future appointments  Follow Up Plan: Telephone follow up appointment with care management team member scheduled for: 6 months          Medication Assistance: None required.  Patient affirms current coverage meets needs.  Compliance/Adherence/Medication fill history: Care Gaps: Shingrix, foot exam, influenza vaccine, COVID booster, colonoscopy  Star-Rating Drugs: Jardiance 10mg - 34m filled 07-27-2020 [facility]DS at [facility] [place] 40mg - L30mfilled 10-12-2020 30DS as [facility] preferred [facility] is:  [facility] [place] [phone] [id number] [place]1 [place] GroveAVillage [place] CreekHNew MiddletownAEnigmaH[place]aksFill[phone] box? Yes Pt endorses 80% compliance  We discussed: Current pharmacy is preferred with insurance plan and patient is satisfied with pharmacy  services Patient decided to: Continue current medication management strategy  Care Plan and Follow Up Patient Decision:  Patient agrees to Care Plan and Follow-up.  Plan: Telephone follow up appointment with care management team member scheduled for:  6 months  Kathalene Sporer Jeni SallesBCACP CliBetancesst Dahl[place] [phone]

## 2020-12-22 ENCOUNTER — Other Ambulatory Visit: Payer: Self-pay | Admitting: Adult Health

## 2020-12-22 ENCOUNTER — Other Ambulatory Visit: Payer: Self-pay | Admitting: Internal Medicine

## 2020-12-22 DIAGNOSIS — Z794 Long term (current) use of insulin: Secondary | ICD-10-CM

## 2020-12-22 DIAGNOSIS — E118 Type 2 diabetes mellitus with unspecified complications: Secondary | ICD-10-CM

## 2020-12-22 DIAGNOSIS — W57XXXA Bitten or stung by nonvenomous insect and other nonvenomous arthropods, initial encounter: Secondary | ICD-10-CM

## 2020-12-25 NOTE — Telephone Encounter (Signed)
Heat avoidance advised. Continue current medicines No driving x 6 months

## 2020-12-27 ENCOUNTER — Ambulatory Visit (INDEPENDENT_AMBULATORY_CARE_PROVIDER_SITE_OTHER): Payer: Medicare Other

## 2020-12-27 DIAGNOSIS — I255 Ischemic cardiomyopathy: Secondary | ICD-10-CM | POA: Diagnosis not present

## 2020-12-28 LAB — CUP PACEART REMOTE DEVICE CHECK
Battery Remaining Longevity: 156 mo
Battery Remaining Percentage: 100 %
Brady Statistic RA Percent Paced: 29 %
Brady Statistic RV Percent Paced: 1 %
Date Time Interrogation Session: 20220806083800
HighPow Impedance: 58 Ohm
Implantable Lead Implant Date: 20050922
Implantable Lead Implant Date: 20050922
Implantable Lead Location: 753859
Implantable Lead Location: 753860
Implantable Lead Model: 158
Implantable Lead Model: 5076
Implantable Lead Serial Number: 156891
Implantable Pulse Generator Implant Date: 20220505
Lead Channel Impedance Value: 474 Ohm
Lead Channel Impedance Value: 475 Ohm
Lead Channel Setting Pacing Amplitude: 2 V
Lead Channel Setting Pacing Amplitude: 2.4 V
Lead Channel Setting Pacing Pulse Width: 0.4 ms
Lead Channel Setting Sensing Sensitivity: 0.5 mV
Pulse Gen Serial Number: 232616

## 2020-12-30 ENCOUNTER — Encounter: Payer: Medicare HMO | Admitting: Internal Medicine

## 2021-01-03 ENCOUNTER — Encounter: Payer: Self-pay | Admitting: Podiatry

## 2021-01-03 ENCOUNTER — Ambulatory Visit (INDEPENDENT_AMBULATORY_CARE_PROVIDER_SITE_OTHER): Payer: Medicare Other | Admitting: Podiatry

## 2021-01-03 ENCOUNTER — Other Ambulatory Visit: Payer: Self-pay

## 2021-01-03 ENCOUNTER — Ambulatory Visit (INDEPENDENT_AMBULATORY_CARE_PROVIDER_SITE_OTHER): Payer: Medicare Other | Admitting: Internal Medicine

## 2021-01-03 VITALS — BP 118/70 | HR 55 | Ht 69.0 in | Wt 181.8 lb

## 2021-01-03 DIAGNOSIS — M79676 Pain in unspecified toe(s): Secondary | ICD-10-CM | POA: Diagnosis not present

## 2021-01-03 DIAGNOSIS — I472 Ventricular tachycardia, unspecified: Secondary | ICD-10-CM

## 2021-01-03 DIAGNOSIS — L84 Corns and callosities: Secondary | ICD-10-CM

## 2021-01-03 DIAGNOSIS — B351 Tinea unguium: Secondary | ICD-10-CM | POA: Diagnosis not present

## 2021-01-03 DIAGNOSIS — I5022 Chronic systolic (congestive) heart failure: Secondary | ICD-10-CM | POA: Diagnosis not present

## 2021-01-03 DIAGNOSIS — E0843 Diabetes mellitus due to underlying condition with diabetic autonomic (poly)neuropathy: Secondary | ICD-10-CM

## 2021-01-03 DIAGNOSIS — I255 Ischemic cardiomyopathy: Secondary | ICD-10-CM | POA: Diagnosis not present

## 2021-01-03 DIAGNOSIS — I2510 Atherosclerotic heart disease of native coronary artery without angina pectoris: Secondary | ICD-10-CM | POA: Diagnosis not present

## 2021-01-03 DIAGNOSIS — I480 Paroxysmal atrial fibrillation: Secondary | ICD-10-CM

## 2021-01-03 NOTE — Patient Instructions (Addendum)
Medication Instructions:  Your physician recommends that you continue on your current medications as directed. Please refer to the Current Medication list given to you today.  Labwork: CBC, BMP, MAG  Testing/Procedures: None ordered.  Follow-Up: Your physician wants you to follow-up in: 05/09/21 at 8 am Legrand Como "Jonni Sanger" [place], PA-C     Remote monitoring is used to monitor your ICD from home. This monitoring reduces the number of office visits required to check your device to one time per year. It allows [place] to keep an eye on the functioning of your device to ensure it is working properly. You are scheduled for a device check from home on 03/28/21. You may send your transmission at any time that day. If you have a wireless device, the transmission will be sent automatically. After your physician reviews your transmission, you will receive a postcard with your next transmission date.  Any Other Special Instructions Will Be Listed Below (If Applicable).  If you need a refill on your cardiac medications before your next appointment, please call your pharmacy.     Dear Patient:  The [facility] device clinic team is grateful for the opportunity to partner with you in your implanted pacemaker/ defibrillator needs.  Our goals are to provide the most up-to-date and comprehensive care for you and your implanted device.    Sharman Cheek is a specially trained registered nurse who will follow your device heart failure diagnostics.  She will call you weekly following discharge and review your medications as well as your daily weights and breathing status.  During the call, she will review with you the results of your recent device interrogation.    We believe that this program will allow [place] to better care for you.  Multiple studies have shown that remote monitoring can help to decrease hospitalizations as well as improve quality of life and decrease your risks of dying.  Through  this program, we hope to partner with you to optimize your cardiac care.   Once again, we would like to thank you for allowing [place] to participate in your care.  If you have any questions, please call the office at [phone].    Sincerely,   [facility] Team and Advanced Heart Failure Team
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
## 2021-01-04 LAB — BASIC METABOLIC PANEL
BUN/Creatinine Ratio: 11 (ref 10–24)
BUN: 16 mg/dL (ref 8–27)
CO2: 25 mmol/L (ref 20–29)
Calcium: 9.3 mg/dL (ref 8.6–10.2)
Chloride: 105 mmol/L (ref 96–106)
Creatinine, Ser: 1.4 mg/dL — ABNORMAL HIGH (ref 0.76–1.27)
Glucose: 98 mg/dL (ref 65–99)
Potassium: 3.9 mmol/L (ref 3.5–5.2)
Sodium: 142 mmol/L (ref 134–144)
eGFR: 53 mL/min/{1.73_m2} — ABNORMAL LOW (ref >59)

## 2021-01-04 LAB — CBC
Hematocrit: 44.4 % (ref 37.5–51.0)
Hemoglobin: 14.9 g/dL (ref 13.0–17.7)
MCH: 31.1 pg (ref 26.6–33.0)
MCHC: 33.6 g/dL (ref 31.5–35.7)
MCV: 93 fL (ref 79–97)
Platelets: 214 10*3/uL (ref 150–450)
RBC: 4.79 x10E6/uL (ref 4.14–5.80)
RDW: 13.1 % (ref 11.6–15.4)
WBC: 6 10*3/uL (ref 3.4–10.8)

## 2021-01-04 LAB — MAGNESIUM: Magnesium: 1.8 mg/dL (ref 1.6–2.3)

## 2021-01-05 NOTE — Patient Instructions (Signed)
[place],  It was great to speak with you again!  Please reach out to me if you have any questions or need anything before our follow up!  Best, Maddie  Jeni Salles, PharmD, [facility]   Visit Information   Goals Addressed   None    Patient Care Plan: CCM Pharmacy Care Plan     Problem Identified: Problem: Hypertension, Hyperlipidemia, Diabetes, Atrial Fibrillation, Heart Failure, Coronary Artery Disease, COPD, Osteoarthritis, BPH and Seizures and Irritability      Long-Range Goal: Patient-Specific Goal   Start Date: 09/21/2020  Expected End Date: 09/21/2021  Recent Progress: On track  Priority: High  Note:   Current Barriers:  Unable to independently monitor therapeutic efficacy  Pharmacist Clinical Goal(s):  Patient will achieve adherence to monitoring guidelines and medication adherence to achieve therapeutic efficacy maintain control of diabetes as evidenced by home blood sugar monitoring and A1c  through collaboration with PharmD and provider.   Interventions: 1:1 collaboration with Dorothyann Peng, NP regarding development and update of comprehensive plan of care as evidenced by provider attestation and co-signature Inter-disciplinary care team collaboration (see longitudinal plan of care) Comprehensive medication review performed; medication list updated in electronic medical record  Hypertension (BP goal <140/90) -Controlled -Current treatment: Metoprolol '50mg'$ , 1 tablet once daily  Metoprolol '25mg'$ , 1 tablet once daily -Medications previously tried: n/a  -Current home readings: 122/70 -Current dietary habits: not doing well lately -Current exercise habits: unable to do much exercise -Denies hypotensive/hypertensive symptoms -Educated on Daily salt intake goal < 2300 mg; Exercise goal of 150 minutes per week; Importance of home blood pressure monitoring; -Counseled to monitor BP at  home weekly, document, and provide log at future appointments -Counseled on diet and exercise extensively Recommended to continue current medication  Hyperlipidemia/CAD: (LDL goal < 70) -Controlled -Current treatment: Rosuvastatin '40mg'$ , 1 tablet once daily Fenofibrate '160mg'$ , 1 tablet every evening  -Medications previously tried: n/a  -Current dietary patterns: not great lately -Current exercise habits: unable to do much exercise -Educated on Cholesterol goals;  Exercise goal of 150 minutes per week; -Counseled on diet and exercise extensively Recommended to continue current medication  Ventricular tachycardia (Goal: maintain regular heart rate and rhythm) -Controlled -Current treatment  Sotalol '80mg'$ , 1 tablet once daily Ranexa 500 mg daily -Medications previously tried: none  -Recommended to continue current medication   Diabetes (A1c goal <7%) -Controlled -Current medications: Jardiance 10 mg 1 tablet daily -Medications previously tried: insulin (no longer needed) -Current home glucose readings fasting glucose: 136  post prandial glucose: n/a -Reports hypoglycemic/hyperglycemic symptoms -Current meal patterns:  breakfast: cereal with milk (8-9 am) lunch: sometimes skips dinner: eats at 5 pm snacks: klondike bars (not sugar free) drinks: did not discuss -Current exercise: limited -Educated on A1c and blood sugar goals; Benefits of routine self-monitoring of blood sugar; Carbohydrate counting and/or plate method -Counseled to check feet daily and get yearly eye exams -Counseled on diet and exercise extensively Recommended to continue current medication Recommended including lunch every day to avoid excessive late night snacking and choosing protein snacks such as nuts, cheese, or protein shakes   Atrial Fibrillation (Goal: prevent stroke and major bleeding) -Controlled -CHADSVASC: 7 -Current treatment: Rate control: Sotalol '80mg'$ , 1 tablet once  daily Anticoagulation: Eliquis 5 mg 1 tablet twice daily -Medications previously tried: n/a -Home BP and HR readings: 122/70  -Counseled on avoidance of NSAIDs due to increased bleeding risk with anticoagulants; -Recommended to continue current medication  Heart Failure (Goal: manage symptoms and prevent exacerbations) -Controlled -Last ejection fraction: 25-30% (Date: 07/21/2019) -HF type: Systolic -NYHA Class: II (slight limitation of activity) -AHA HF Stage: C (Heart disease and symptoms present) -Current treatment: Metoprolol '25mg'$ , 1 tablet once daily Metoprolol '50mg'$ , 1 tablet once daily  Bidil 20-37.'5mg'$ , 2 tablet twice times daily Entresto 97/'103mg'$ , 1 tablet twice daily -Medications previously tried: spironolactone (gynecomastia) -Current home BP/HR readings: 122/70 (in office) -Current dietary habits: not great lately -Current exercise habits: limited -Educated on Importance of weighing daily; if you gain more than 3 pounds in one day or 5 pounds in one week, call cardiologist Importance of blood pressure control -Recommended to continue current medication  COPD (Goal: control symptoms and prevent exacerbations) -Controlled -Current treatment  Albuterol HFA 124mg/act inhaler, 1 to 2 puffs every four hours as needed for wheezing or shortness of breath Spiriva Respimat 2.539m/ act inhaler, 2 puffs once daily -Medications previously tried: Symbicort  -Gold Grade: Gold 1 (FEV1>80%) -Current COPD Classification:  B (high sx, <2 exacerbations/yr) -MMRC/CAT score: 4 -Pulmonary function testing: n/a -Exacerbations requiring treatment in last 6 months: none -Patient reports consistent use of maintenance inhaler -Frequency of rescue inhaler use: as needed -Counseled on Benefits of consistent maintenance inhaler use -Recommended to continue current medication  Anger/irritability (Goal: minimize symptoms) -Uncontrolled -Current treatment: No medications -Medications previously  tried/failed: sertraline (drowsiness/stumbling), fluoxetine (rash) -PHQ9: 11 -GAD7: n/a -Educated on Benefits of medication for symptom control -Educated on different side effects with SSRIs and activating nature vs not with certain medications Collaborated with Dr. AqDelice Leschbout genetic testing for alternative therapy.  Seizures (Goal: prevent seizures) -Controlled -Current treatment  Vimpat '100mg'$ , 1 tablet twice daily  -Medications previously tried: n/a  -Recommended to continue current medication  BPH (Goal: minimize symptoms) -Controlled -Current treatment  Finasteride '5mg'$ , 1 tablet once daily tamsulosin 0.'4mg'$ , 1 capsule once daily mirabegron ER '25mg'$ , 1 tablet once daily -Medications previously tried: n/a  -Recommended to continue current medication  Memory loss (Goal: slow further progression of memory loss) -Controlled -Current treatment  memantine '10mg'$ , 1 tablet twice daily  -Medications previously tried: donepezil (worsened renal function)  -Recommended to continue current medication  CKD Stage III (Goal: prevent kidney worsening) -Controlled -Current treatment  Jardiance 10 mg 1 tablet daily -Medications previously tried: none  -Recommended to continue current medication   Health Maintenance -Vaccine gaps: shingles, tetanus, COVID booster -Current therapy:  Levocetirizine 5 mg 1 tablet daily -Educated on Cost vs benefit of each product must be carefully weighed by individual consumer -Patient is satisfied with current therapy and denies issues -Recommended to continue current medication  Patient Goals/Self-Care Activities Patient will:  - take medications as prescribed check glucose weekly, document, and provide at future appointments check blood pressure weekly, document, and provide at future appointments  Follow Up Plan: Telephone follow up appointment with care management team member scheduled for: 6 months       Patient verbalizes understanding of  instructions provided today and agrees to view in [facility] The pharmacy team will reach out to the patient again over the next 30 days.   MaViona GilmoreRPLargo Surgery LLC Dba West Bay Surgery Center

## 2021-01-07 NOTE — Progress Notes (Signed)
Subjective:  [name], male    DOB: [birth year],  MRN: [id number]  75 y.o. male presents with at risk foot care with history of diabetic neuropathy and callus(es) bilateral feet and painful thick toenails that are difficult to trim. Painful toenails interfere with ambulation. Aggravating factors include wearing enclosed shoe gear. Pain is relieved with periodic professional debridement. Painful calluses are aggravated when weightbearing with and without shoegear. Pain is relieved with periodic professional debridement..    Patient did not check blood glucose this morning.  PCP: Dorothyann Peng, NP and last visit was: 10/19/2020  He voices no new pedal concerns on today's visit.  Review of Systems: Negative except as noted in the HPI.   Allergies  Allergen Reactions   Aricept [Donepezil Hcl] Other (See Comments)    Worsens renal function   Codeine Hives   Nexium [Esomeprazole] Other (See Comments)    Severely worsens renal function   Omeprazole Hives   Pantoprazole Sodium Other (See Comments)    Renal failure   Tramadol Nausea And Vomiting    Objective:  There were no vitals filed for this visit. Constitutional Patient is a pleasant 75 y.o. African American male in NAD. AAO x 3.  Vascular Capillary fill time to digits <3 seconds b/l lower extremities. Palpable DP pulse(s) b/l lower extremities Palpable PT pulse(s) b/l lower extremities Pedal hair sparse. Lower extremity skin temperature gradient within normal limits. No pain with calf compression b/l. No edema noted b/l lower extremities. No cyanosis or clubbing noted.  Neurologic Normal speech. Protective sensation intact 5/5 intact bilaterally with 10g monofilament b/l. Vibratory sensation intact b/l.  Dermatologic Skin warm and supple b/l lower extremities. No open wounds b/l lower extremities. No interdigital macerations b/l lower extremities. Toenails 1-5 b/l elongated, discolored, dystrophic, thickened, crumbly  with subungual debris and tenderness to dorsal palpation. Hyperkeratotic lesion(s) submet head 1 left foot, submet head 1 right foot, submet head 5 left foot, and submet head 5 right foot.  No erythema, no edema, no drainage, no fluctuance.  Orthopedic: Normal muscle strength 5/5 to all lower extremity muscle groups bilaterally. No pain crepitus or joint limitation noted with ROM b/l lower extremities. Hallux valgus with bunion deformity noted b/l lower extremities. Tailor's bunion deformity noted b/l lower extremities.   Hemoglobin A1C Latest Ref Rng & Units 07/27/2020 04/27/2020 01/27/2020  HGBA1C 4.0 - 5.6 % 6.6(A) 6.9(A) 6.6(A)  Some recent data might be hidden       Assessment:   1. Pain due to onychomycosis of toenail   2. Callus   3. Diabetes mellitus due to underlying condition with diabetic autonomic neuropathy, unspecified whether long term insulin use ([place])    Plan:  Patient was evaluated and treated and all questions answered.  Onychomycosis with pain -Nails palliatively debridement as below. -Educated on self-care  Procedure: Nail Debridement Rationale: Pain Type of Debridement: manual, sharp debridement. Instrumentation: Nail nipper, rotary burr. Number of Nails: 10  -Examined patient. -Continue diabetic foot care principles: inspect feet daily, monitor glucose as recommended by PCP and/or Endocrinologist, and follow prescribed diet per PCP, Endocrinologist and/or dietician. -Patient to continue soft, supportive shoe gear daily. -Toenails 1-5 b/l were debrided in length and girth with sterile nail nippers and dremel without iatrogenic bleeding.  -Callus(es) submet head 1 left foot, submet head 1 right foot, submet head 5 left foot, and submet head 5 right foot pared utilizing sterile scalpel blade without complication or incident. Total number debrided =4. -Patient to report any pedal  injuries to medical professional immediately. -Patient/POA to call should there be  question/concern in the interim.  Return in about 3 months (around 04/05/2021).  Marzetta Board, DPM

## 2021-01-18 DIAGNOSIS — M1990 Unspecified osteoarthritis, unspecified site: Secondary | ICD-10-CM | POA: Diagnosis not present

## 2021-01-18 DIAGNOSIS — I5020 Unspecified systolic (congestive) heart failure: Secondary | ICD-10-CM | POA: Diagnosis not present

## 2021-01-18 DIAGNOSIS — J449 Chronic obstructive pulmonary disease, unspecified: Secondary | ICD-10-CM | POA: Diagnosis not present

## 2021-01-20 NOTE — Progress Notes (Signed)
Remote ICD transmission.   

## 2021-01-25 ENCOUNTER — Other Ambulatory Visit: Payer: Self-pay | Admitting: Adult Health

## 2021-01-25 ENCOUNTER — Other Ambulatory Visit (HOSPITAL_COMMUNITY): Payer: Self-pay | Admitting: Internal Medicine

## 2021-01-25 DIAGNOSIS — Z794 Long term (current) use of insulin: Secondary | ICD-10-CM

## 2021-01-27 ENCOUNTER — Telehealth: Payer: Self-pay | Admitting: Podiatry

## 2021-01-27 ENCOUNTER — Other Ambulatory Visit (HOSPITAL_COMMUNITY): Payer: Self-pay | Admitting: *Deleted

## 2021-01-27 DIAGNOSIS — I5022 Chronic systolic (congestive) heart failure: Secondary | ICD-10-CM

## 2021-01-27 NOTE — Telephone Encounter (Signed)
Pts wife called stating pts diabetic shoes are coming apart from the sole on the front. He got them in April. Confirmed pt wants the same shoe as a replacement.  I have messaged the company to get a replacement.

## 2021-02-04 ENCOUNTER — Telehealth: Payer: Self-pay

## 2021-02-04 NOTE — Telephone Encounter (Signed)
Referred to ICM clinic by Dr Rayann Heman.    Spoke with patient and wife.  He was agreeable to monthly follow up.  Advised monitor should be by bedside in order for the report to transmit automatically.  Advised will receive a call after the transmission is reviewed to provide results.  Provided ICM number and explained should call if experiencing any fluid symptoms such as weight gain, shortness of breath or extremity/abdominal swelling.  Wife reports she was just discharged from the hospital after having major surgery.  01/31/2021 Latitude report suggesting fluid levels within normal limits.  Heartlogic HF index 0.   Advised will schedule ICM remote transmission for fluid level check on 03/07/2021.

## 2021-02-04 NOTE — Telephone Encounter (Signed)
-----   Message from [name], MD sent at 01/03/2021  4:47 PM EDT ----- Please enroll in ICM device clinic He is interested in receiving a call.

## 2021-02-08 ENCOUNTER — Encounter: Payer: Self-pay | Admitting: Adult Health

## 2021-02-08 NOTE — Telephone Encounter (Signed)
Pt has been scheduled for VV 

## 2021-02-09 ENCOUNTER — Other Ambulatory Visit: Payer: Self-pay

## 2021-02-09 ENCOUNTER — Telehealth (INDEPENDENT_AMBULATORY_CARE_PROVIDER_SITE_OTHER): Payer: Medicare Other | Admitting: Adult Health

## 2021-02-09 DIAGNOSIS — U071 COVID-19: Secondary | ICD-10-CM

## 2021-02-09 MED ORDER — MOLNUPIRAVIR EUA 200MG CAPSULE: 4.0000 | ORAL_CAPSULE | Freq: Two times a day (BID) | ORAL | 0 refills | Status: AC
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
## 2021-02-18 ENCOUNTER — Telehealth: Payer: Self-pay

## 2021-02-18 ENCOUNTER — Ambulatory Visit: Payer: Medicare HMO

## 2021-02-18 NOTE — Telephone Encounter (Signed)
Called patient [phone] or [phone]  [facility]  , no answer and ringing busy.  No alternate number available.  Patient may reschedule next available appointment.  L.Darris Carachure,LPN

## 2021-02-22 ENCOUNTER — Other Ambulatory Visit: Payer: Self-pay | Admitting: Adult Health

## 2021-02-22 DIAGNOSIS — Z794 Long term (current) use of insulin: Secondary | ICD-10-CM

## 2021-02-22 DIAGNOSIS — E118 Type 2 diabetes mellitus with unspecified complications: Secondary | ICD-10-CM

## 2021-02-25 ENCOUNTER — Ambulatory Visit (INDEPENDENT_AMBULATORY_CARE_PROVIDER_SITE_OTHER): Payer: Medicare Other | Admitting: Gastroenterology

## 2021-02-25 ENCOUNTER — Encounter: Payer: Self-pay | Admitting: Gastroenterology

## 2021-02-25 VITALS — BP 140/60 | HR 60 | Ht 69.0 in | Wt 180.1 lb

## 2021-02-25 DIAGNOSIS — Z1211 Encounter for screening for malignant neoplasm of colon: Secondary | ICD-10-CM | POA: Diagnosis not present
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
## 2021-02-25 NOTE — Patient Instructions (Signed)
Follow up as needed

## 2021-02-28 ENCOUNTER — Other Ambulatory Visit: Payer: Self-pay | Admitting: Adult Health

## 2021-02-28 ENCOUNTER — Other Ambulatory Visit (HOSPITAL_COMMUNITY): Payer: Self-pay

## 2021-02-28 ENCOUNTER — Encounter: Payer: Self-pay | Admitting: Adult Health

## 2021-02-28 DIAGNOSIS — Z794 Long term (current) use of insulin: Secondary | ICD-10-CM

## 2021-02-28 DIAGNOSIS — E118 Type 2 diabetes mellitus with unspecified complications: Secondary | ICD-10-CM

## 2021-03-01 ENCOUNTER — Encounter: Payer: Self-pay | Admitting: Neurology

## 2021-03-01 ENCOUNTER — Other Ambulatory Visit: Payer: Self-pay

## 2021-03-01 ENCOUNTER — Ambulatory Visit (INDEPENDENT_AMBULATORY_CARE_PROVIDER_SITE_OTHER): Payer: Medicare Other | Admitting: Neurology

## 2021-03-01 VITALS — BP 123/66 | HR 67 | Ht 69.0 in | Wt 180.0 lb

## 2021-03-01 DIAGNOSIS — G3184 Mild cognitive impairment, so stated: Secondary | ICD-10-CM | POA: Diagnosis not present

## 2021-03-01 DIAGNOSIS — G40209 Localization-related (focal) (partial) symptomatic epilepsy and epileptic syndromes with complex partial seizures, not intractable, without status epilepticus: Secondary | ICD-10-CM

## 2021-03-01 DIAGNOSIS — F32A Depression, unspecified: Secondary | ICD-10-CM | POA: Diagnosis not present

## 2021-03-01 MED ORDER — MEMANTINE HCL 10 MG PO TABS: 10.0000 mg | ORAL_TABLET | Freq: Two times a day (BID) | ORAL | 3 refills | Status: DC
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
## 2021-03-01 NOTE — Patient Instructions (Signed)
Good to see you!  Continue Vimpat 100mg  twice a day  2. Continue Memantine 10mg  twice a day  3. Referral will be sent to [facility] to help with management of mood  4. Follow-up in 6 months,call for any changes   Seizure Precautions: 1. If medication has been prescribed for you to prevent seizures, take it exactly as directed.  Do not stop taking the medicine without talking to your doctor first, even if you have not had a seizure in a long time.   2. Avoid activities in which a seizure would cause danger to yourself or to others.  Don't operate dangerous machinery, swim alone, or climb in high or dangerous places, such as on ladders, roofs, or girders.  Do not drive unless your doctor says you may.  3. If you have any warning that you may have a seizure, lay down in a safe place where you can't hurt yourself.    4.  No driving for 6 months from last seizure, as per [facility].   Please refer to the following link on the [facility] website for more information: [email or web address]   5.  Maintain good sleep hygiene. Avoid alcohol.  6.  Contact your doctor if you have any problems that may be related to the medicine you are taking.  7.  Call 911 and bring the patient back to the ED if:        A.  The seizure lasts longer than 5 minutes.       B.  The patient doesn't awaken shortly after the seizure  C.  The patient has new problems such as difficulty seeing, speaking or moving  D.  The patient was injured during the seizure  E.  The patient has a temperature over 102 F (39C)  F.  The patient vomited and now is having trouble breathing         RECOMMENDATIONS FOR ALL PATIENTS WITH MEMORY PROBLEMS: 1. Continue to exercise (Recommend 30 minutes of walking everyday, or 3 hours every week) 2. Increase social interactions - continue going to [place] and enjoy social gatherings with friends and  family 3. Eat healthy, avoid fried foods and eat more fruits and vegetables 4. Maintain adequate blood pressure, blood sugar, and blood cholesterol level. Reducing the risk of stroke and cardiovascular disease also helps promoting better memory. 5. Avoid stressful situations. Live a simple life and avoid aggravations. Organize your time and prepare for the next day in anticipation. 6. Sleep well, avoid any interruptions of sleep and avoid any distractions in the bedroom that may interfere with adequate sleep quality 7. Avoid sugar, avoid sweets as there is a strong link between excessive sugar intake, diabetes, and cognitive impairment The Mediterranean diet has been shown to help patients reduce the risk of progressive memory disorders and reduces cardiovascular risk. This includes eating fish, eat fruits and green leafy vegetables, nuts like almonds and hazelnuts, walnuts, and also use olive oil. Avoid fast foods and fried foods as much as possible. Avoid sweets and sugar as sugar use has been linked to worsening of memory function.

## 2021-03-04 ENCOUNTER — Telehealth: Payer: Self-pay | Admitting: Pharmacist

## 2021-03-04 NOTE — Chronic Care Management (AMB) (Signed)
Chronic Care Management Pharmacy Assistant   Name: [name]  MRN: [id number] DOB: Sep 18, 1945  Reason for Encounter: Disease State Hypertension Assessment Call    Conditions to be addressed/monitored: HTN  Recent office visits:  02/09/21 Dorothyann Peng, NP - Patient presented via Tele-health for COVID 19 Infection. Prescribed Molnupiravir.   Recent consult visits:  03/01/21 Cameron Sprang, MD (Neurology) - Patient presented for Localization-related symptomatic epilepsy and epileptic syndromes with complex partial seizures, not intractable, without status epilepticus and other concerns. No medication changes.   02/25/21 Mauri Pole, MD (Gastroenterology) - Patient presented for Colon cancer screening. No medication changes.   01/03/21 [name], MD (Cardiology) - Patient reported for Chronic systolic CHF and other concerns. Stopped Fluoxetine HCL and Sertraline HCL.  01/03/21 Marzetta Board, DPM - Patient reported for Pain due to onychomycosis of toenail and other concerns. No medication changes.  Hospital visits:  Medication Reconciliation was completed by comparing discharge summary, patient's EMR and Pharmacy list, and upon discussion with patient.   Patient presented to [facility] ED on 03-06-21 due to wrist laceration on left wrist. Patient was there for 4 hours.   New?Medications Started at [facility] Discharge:?? -started oxyCODONE-acetaminophen 5-325 MG   Medication Changes at Hospital Discharge: -Changed None   Medications Discontinued at Hospital Discharge: -Stopped None   Medications that remain the same after Hospital Discharge:??  -All other medications will remain the same.       Medication Reconciliation was completed by comparing discharge summary, patient's EMR and Pharmacy list, and upon discussion with patient.   Patient visited [facility] on 09-23-2020 due to ICD Generator Change out. Patient was there for  4 hours.   New?Medications Started at [facility] Discharge:?? -started None   Medication Changes at Hospital Discharge: -Changed None   Medications Discontinued at Hospital Discharge: -Stopped None   Medications that remain the same after Hospital Discharge:??  -All other medications will remain the same.      Medications: Outpatient Encounter Medications as of 03/04/2021  Medication Sig   albuterol (VENTOLIN HFA) 108 (90 Base) MCG/ACT inhaler Inhale 1-2 puffs into the lungs every 4 (four) hours as needed for wheezing or shortness of breath (or cough).   BIDIL 20-37.5 MG tablet TAKE 2 TABLETS BY MOUTH 2 (TWO) TIMES DAILY.   Blood Glucose Monitoring Suppl (ONETOUCH VERIO FLEX SYSTEM) w/Device KIT USE TO TEST BLOOD GLUCOSE THREE TIMES DAILY   Blood Pressure Monitoring (BLOOD PRESSURE MONITOR/M CUFF) MISC Upper arm blood pressure monitor   EASY COMFORT PEN NEEDLES 31G X 5 MM MISC USE DAILY AT BEDTIME.   ELIQUIS 5 MG TABS tablet TAKE 1 TABLET BY MOUTH 2 (TWO) TIMES DAILY (AM+BEDTIME)   ENTRESTO 97-103 MG TAKE 1 TABLET BY MOUTH 2 (TWO) TIMES DAILY.   fenofibrate 160 MG tablet TAKE 1 TABLET BY MOUTH EVERY EVENING.   finasteride (PROSCAR) 5 MG tablet TAKE ONE TABLET BY MOUTH ONCE DAILY (AM)   JARDIANCE 10 MG TABS tablet TAKE 1 TABLET (10 MG TOTAL) BY MOUTH DAILY BEFORE BREAKFAST.   Lancets (ONETOUCH DELICA PLUS [id number]) MISC USE TO TEST BLOOD SUGAR THREE TIMES PER DAY   Lancets (ONETOUCH ULTRASOFT) lancets Use as instructed   levocetirizine (XYZAL) 5 MG tablet TAKE 1 TABLET (5 MG TOTAL) BY MOUTH EVERY EVENING (BEDTIME)   memantine (NAMENDA) 10 MG tablet Take 1 tablet (10 mg total) by mouth 2 (two) times daily.   metoprolol succinate (TOPROL-XL) 25 MG 24 hr tablet TAKE  1 TABLET (25 MG TOTAL) BY MOUTH EVERY MORNING (AM)   metoprolol succinate (TOPROL-XL) 50 MG 24 hr tablet TAKE 1 TABLET (50 MG TOTAL) BY MOUTH EVERY EVENING. TAKE WITH OR IMMEDIATELY FOLLOWING A MEAL (BEDTIME)   metoprolol  succinate (TOPROL-XL) 50 MG 24 hr tablet Take 1 tablet (50 mg total) by mouth at bedtime. TAKE WITH OR IMMEDIATELY FOLLOWING A MEAL.   mirabegron ER (MYRBETRIQ) 25 MG TB24 tablet Take 25 mg by mouth daily.    nitroGLYCERIN (NITROSTAT) 0.4 MG SL tablet Place 1 tablet (0.4 mg total) under the tongue every 5 (five) minutes as needed for chest pain.   ONETOUCH VERIO test strip USE TO TEST BLOOD GLUCOSE THREE TIMES A DAY MEDICALLY NECESSARY   ranolazine (RANEXA) 500 MG 12 hr tablet TAKE 1 TABLET (500 MG TOTAL) BY MOUTH DAILY.   rosuvastatin (CRESTOR) 40 MG tablet TAKE 1 TABLET (40 MG TOTAL) BY MOUTH EVERY EVENING (BEDTIME)   sotalol (BETAPACE) 80 MG tablet TAKE 1 TABLET (80 MG TOTAL) BY MOUTH DAILY.   SPIRIVA RESPIMAT 2.5 MCG/ACT AERS INHALE 2 PUFFS INTO THE LUNGS DAILY.   tamsulosin (FLOMAX) 0.4 MG CAPS capsule Take 0.4 mg by mouth at bedtime.    triamcinolone cream (KENALOG) 0.1 % Apply 1 application topically 2 (two) times daily.   VIMPAT 100 MG TABS Take 1 tablet twice a day   No facility-administered encounter medications on file as of 03/04/2021.  Reviewed chart prior to disease state call. Spoke with patient regarding BP  Recent Office Vitals: BP Readings from Last 3 Encounters:  03/01/21 123/66  02/25/21 140/60  01/03/21 118/70   Pulse Readings from Last 3 Encounters:  03/01/21 67  02/25/21 60  01/03/21 (!) 55    Wt Readings from Last 3 Encounters:  03/01/21 180 lb (81.6 kg)  02/25/21 180 lb 2 oz (81.7 kg)  01/03/21 181 lb 12.8 oz (82.5 kg)     Kidney Function Lab Results  Component Value Date/Time   CREATININE 1.40 (H) 01/03/2021 04:38 PM   CREATININE 1.7 (A) 10/20/2020 12:00 AM   CREATININE 1.72 (H) 09/21/2020 11:23 AM   CREATININE 1.86 (H) 01/26/2016 11:44 AM   CREATININE 1.72 (H) 07/19/2015 03:51 PM   GFR 42.52 (L) 08/01/2019 11:25 AM   GFRNONAA 45 (L) 06/11/2020 10:14 AM   GFRAA 41 10/20/2020 12:00 AM    BMP Latest Ref Rng & Units 01/03/2021 10/20/2020 09/21/2020   Glucose 65 - 99 mg/dL 98 - 163(H)  BUN 8 - 27 mg/dL 16 - 22  Creatinine 0.76 - 1.27 mg/dL 1.40(H) 1.7(A) 1.72(H)  BUN/Creat Ratio 10 - 24 11 - 13  Sodium 134 - 144 mmol/L 142 143 139  Potassium 3.5 - 5.2 mmol/L 3.9 4.4 4.6  Chloride 96 - 106 mmol/L 105 105 103  CO2 20 - 29 mmol/L 25 27(A) 28  Calcium 8.6 - 10.2 mg/dL 9.3 10.4 9.7    Current antihypertensive regimen:  Metoprolol 70m, 1 tablet once daily  Metoprolol 261m 1 tablet once daily  Care Gaps: BP - 123/66 Zoster Vaccine - Overdue Foot Exam - 01/03/21 COVID Booster - Overdue Colonoscopy - Not recommended for pt per Gastro Flu Vaccine - Overdue  HGB A1C - Overdue 6.6 (07/2020) CCM-  Needs Scheduling AWV - 10/22  Star Rating Drugs: Rosuvastatin (Crestor) 40 Mg - Last filled 02/22/21 30 DS at [facility] Empagliflozin (Jardiance) 10 Mg - Last filled 02/22/21 30 DS at [facility] Verified by calling pharmacy  3 attempts made to reach patient/ no return  call   [facility] Clinical Pharmacist Assistant [phone]

## 2021-03-06 ENCOUNTER — Emergency Department (HOSPITAL_COMMUNITY)
Admission: EM | Admit: 2021-03-06 | Discharge: 2021-03-06 | Disposition: A | Discharge status: Home / Self Care | Payer: Medicare Other | Attending: Emergency Medicine | Admitting: Emergency Medicine

## 2021-03-06 ENCOUNTER — Encounter (HOSPITAL_COMMUNITY): Payer: Self-pay

## 2021-03-06 ENCOUNTER — Other Ambulatory Visit: Payer: Self-pay

## 2021-03-06 ENCOUNTER — Emergency Department (HOSPITAL_COMMUNITY): Payer: Medicare Other

## 2021-03-06 DIAGNOSIS — W293XXA Contact with powered garden and outdoor hand tools and machinery, initial encounter: Secondary | ICD-10-CM | POA: Insufficient documentation

## 2021-03-06 DIAGNOSIS — Z743 Need for continuous supervision: Secondary | ICD-10-CM | POA: Diagnosis not present

## 2021-03-06 DIAGNOSIS — I130 Hypertensive heart and chronic kidney disease with heart failure and stage 1 through stage 4 chronic kidney disease, or unspecified chronic kidney disease: Secondary | ICD-10-CM | POA: Diagnosis not present

## 2021-03-06 DIAGNOSIS — I2510 Atherosclerotic heart disease of native coronary artery without angina pectoris: Secondary | ICD-10-CM | POA: Diagnosis not present

## 2021-03-06 DIAGNOSIS — J449 Chronic obstructive pulmonary disease, unspecified: Secondary | ICD-10-CM | POA: Insufficient documentation

## 2021-03-06 DIAGNOSIS — S6730XA Crushing injury of unspecified wrist, initial encounter: Secondary | ICD-10-CM | POA: Diagnosis not present

## 2021-03-06 DIAGNOSIS — E1122 Type 2 diabetes mellitus with diabetic chronic kidney disease: Secondary | ICD-10-CM | POA: Diagnosis not present

## 2021-03-06 DIAGNOSIS — I4891 Unspecified atrial fibrillation: Secondary | ICD-10-CM | POA: Insufficient documentation

## 2021-03-06 DIAGNOSIS — Z87891 Personal history of nicotine dependence: Secondary | ICD-10-CM | POA: Diagnosis not present

## 2021-03-06 DIAGNOSIS — Z950 Presence of cardiac pacemaker: Secondary | ICD-10-CM | POA: Diagnosis not present

## 2021-03-06 DIAGNOSIS — S6992XA Unspecified injury of left wrist, hand and finger(s), initial encounter: Secondary | ICD-10-CM | POA: Diagnosis present

## 2021-03-06 DIAGNOSIS — Z79899 Other long term (current) drug therapy: Secondary | ICD-10-CM | POA: Diagnosis not present

## 2021-03-06 DIAGNOSIS — S61512A Laceration without foreign body of left wrist, initial encounter: Secondary | ICD-10-CM | POA: Diagnosis not present

## 2021-03-06 DIAGNOSIS — Z23 Encounter for immunization: Secondary | ICD-10-CM | POA: Insufficient documentation

## 2021-03-06 DIAGNOSIS — N183 Chronic kidney disease, stage 3 unspecified: Secondary | ICD-10-CM | POA: Diagnosis not present

## 2021-03-06 DIAGNOSIS — Z7901 Long term (current) use of anticoagulants: Secondary | ICD-10-CM | POA: Diagnosis not present

## 2021-03-06 DIAGNOSIS — I5022 Chronic systolic (congestive) heart failure: Secondary | ICD-10-CM | POA: Insufficient documentation

## 2021-03-06 LAB — BASIC METABOLIC PANEL
Anion gap: 5 (ref 5–15)
BUN: 22 mg/dL (ref 8–23)
CO2: 28 mmol/L (ref 22–32)
Calcium: 9.6 mg/dL (ref 8.9–10.3)
Chloride: 106 mmol/L (ref 98–111)
Creatinine, Ser: 1.62 mg/dL — ABNORMAL HIGH (ref 0.61–1.24)
GFR, Estimated: 44 mL/min — ABNORMAL LOW (ref >60)
Glucose, Bld: 71 mg/dL (ref 70–99)
Potassium: 4 mmol/L (ref 3.5–5.1)
Sodium: 139 mmol/L (ref 135–145)

## 2021-03-06 LAB — CBC WITH DIFFERENTIAL/PLATELET
Abs Immature Granulocytes: 0.01 10*3/uL (ref 0.00–0.07)
Basophils Absolute: 0 10*3/uL (ref 0.0–0.1)
Basophils Relative: 1 %
Eosinophils Absolute: 0.3 10*3/uL (ref 0.0–0.5)
Eosinophils Relative: 5 %
HCT: 46.7 % (ref 39.0–52.0)
Hemoglobin: 15.5 g/dL (ref 13.0–17.0)
Immature Granulocytes: 0 %
Lymphocytes Relative: 29 %
Lymphs Abs: 1.9 10*3/uL (ref 0.7–4.0)
MCH: 30.9 pg (ref 26.0–34.0)
MCHC: 33.2 g/dL (ref 30.0–36.0)
MCV: 93 fL (ref 80.0–100.0)
Monocytes Absolute: 0.7 10*3/uL (ref 0.1–1.0)
Monocytes Relative: 11 %
Neutro Abs: 3.4 10*3/uL (ref 1.7–7.7)
Neutrophils Relative %: 54 %
Platelets: 216 10*3/uL (ref 150–400)
RBC: 5.02 MIL/uL (ref 4.22–5.81)
RDW: 13.4 % (ref 11.5–15.5)
WBC: 6.4 10*3/uL (ref 4.0–10.5)
nRBC: 0 % (ref 0.0–0.2)

## 2021-03-06 MED ORDER — BACITRACIN ZINC 500 UNIT/GM EX OINT
TOPICAL_OINTMENT | Freq: Two times a day (BID) | CUTANEOUS | Status: DC
  Filled 2021-03-06: qty 0.9

## 2021-03-06 MED ORDER — HYDROMORPHONE HCL 1 MG/ML IJ SOLN
0.5000 mg | Freq: Once | INTRAMUSCULAR | Status: AC
  Administered 2021-03-06: 0.5 mg via INTRAVENOUS
  Filled 2021-03-06: qty 1

## 2021-03-06 MED ORDER — TETANUS-DIPHTH-ACELL PERTUSSIS 5-2.5-18.5 LF-MCG/0.5 IM SUSY
0.5000 mL | PREFILLED_SYRINGE | Freq: Once | INTRAMUSCULAR | Status: AC
  Administered 2021-03-06: 0.5 mL via INTRAMUSCULAR
  Filled 2021-03-06: qty 0.5

## 2021-03-06 MED ORDER — OXYCODONE-ACETAMINOPHEN 5-325 MG PO TABS: 1.0000 | ORAL_TABLET | Freq: Four times a day (QID) | ORAL | 0 refills | Status: AC | PRN

## 2021-03-06 MED ORDER — LIDOCAINE-EPINEPHRINE 2 %-1:100000 IJ SOLN
30.0000 mL | Freq: Once | INTRAMUSCULAR | Status: AC
  Administered 2021-03-06: 30 mL
  Filled 2021-03-06: qty 2

## 2021-03-06 NOTE — ED Triage Notes (Signed)
Pt BIB EMS. Pt coming from home, pt was cutting metal with a saw and cut his left wrist. Pt has a long lac to left wrist. Pt states his tetanus shot is over 74 years old. Pt is on blood thinner. Pt does have movement in left hand.

## 2021-03-06 NOTE — Discharge Instructions (Addendum)
You were seen today for evaluation of your wrist laceration. 8 non-absorbable sutures were placed in your wrist. You will need to follow-up with your PCP for removal of these in 10-12 days. Please leave dressing placed in the ED on your wound for the next couple of days, and from there do not scrub in the shower but you can let soap and warm run over the wound.   I have given you medication for pain today as well, do not drive after this medication. I have also given you bacitracin ointment to apply with dressing changes.  Return if your wound begins draining yellow fluid or begins to look warm or inflamed.
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
## 2021-03-06 NOTE — ED Notes (Signed)
Applied bacitracin ointment to left wrist laceration. Applied nonadherent dressing and wrapped with 3" gauze and coban.

## 2021-03-07 ENCOUNTER — Ambulatory Visit: Payer: Medicare Other

## 2021-03-07 DIAGNOSIS — I5022 Chronic systolic (congestive) heart failure: Secondary | ICD-10-CM

## 2021-03-07 DIAGNOSIS — Z95810 Presence of automatic (implantable) cardiac defibrillator: Secondary | ICD-10-CM

## 2021-03-08 ENCOUNTER — Other Ambulatory Visit: Payer: Self-pay

## 2021-03-08 ENCOUNTER — Ambulatory Visit: Payer: Medicare Other

## 2021-03-08 ENCOUNTER — Telehealth: Payer: Self-pay

## 2021-03-08 NOTE — Progress Notes (Signed)
EPIC Encounter for ICM Monitoring  Patient Name: Abad Manard is a 75 y.o. male Date: 03/08/2021 Primary Care Physican: Dorothyann Peng, NP Primary Cardiologist: [place] Electrophysiologist: Allred Last Weight: 181 lbs      Attempted call to wife/patient and unable to reach.  Left detailed message per DPR regarding transmission. Transmission reviewed.    03/06/2021 [employer] Heart Failure Index is 8 suggesting fluid accumulation which is within normal threshold range.  Prescribed: Jardiance take 1 tablet daily  Recommendations: Left voice mail with ICM number and encouraged to call if experiencing any fluid symptoms.  Follow-up plan: ICM clinic phone appointment on 04/18/2021.   91 day device clinic remote transmission 03/28/2021.             EP/Cardiology next office visit: 04/01/2021 with Dr. Haroldine Laws.  06/09/2020 with Oda Kilts, PA         Copy of ICM check sent to Dr. Rayann Heman.  3 Month Trend    8 Day Data Trend          Rosalene Billings, RN 03/08/2021 2:21 PM

## 2021-03-08 NOTE — Telephone Encounter (Signed)
Remote ICM transmission received.  Attempted call to patient regarding ICM remote transmission and left detailed message per DPR.  Advised to return call for any fluid symptoms or questions. Next ICM remote transmission scheduled 04/18/2021.    

## 2021-03-14 ENCOUNTER — Telehealth: Payer: Self-pay

## 2021-03-14 ENCOUNTER — Other Ambulatory Visit: Payer: Self-pay
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
## 2021-03-15 ENCOUNTER — Ambulatory Visit (INDEPENDENT_AMBULATORY_CARE_PROVIDER_SITE_OTHER): Payer: Medicare Other

## 2021-03-15 ENCOUNTER — Telehealth: Payer: Self-pay | Admitting: Podiatry

## 2021-03-15 ENCOUNTER — Ambulatory Visit (INDEPENDENT_AMBULATORY_CARE_PROVIDER_SITE_OTHER): Payer: Medicare Other | Admitting: Adult Health

## 2021-03-15 ENCOUNTER — Encounter: Payer: Self-pay | Admitting: Adult Health

## 2021-03-15 VITALS — BP 100/60 | HR 71 | Temp 98.4°F | Ht 69.0 in | Wt 177.0 lb

## 2021-03-15 DIAGNOSIS — Z4802 Encounter for removal of sutures: Secondary | ICD-10-CM

## 2021-03-15 DIAGNOSIS — M545 Low back pain, unspecified: Secondary | ICD-10-CM

## 2021-03-15 MED ORDER — CYCLOBENZAPRINE HCL 10 MG PO TABS: 10.0000 mg | ORAL_TABLET | Freq: Three times a day (TID) | ORAL | 0 refills | Status: DC | PRN

## 2021-03-15 NOTE — Telephone Encounter (Signed)
Replacement diabetic shoes in.. pts wife aware ok to pick up.Marland KitchenMarland Kitchen
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
## 2021-03-15 NOTE — Addendum Note (Signed)
Addended by: Apolinar Junes on: 03/15/2021 04:52 PM   Modules accepted: Orders

## 2021-03-16 ENCOUNTER — Ambulatory Visit: Payer: Medicare Other

## 2021-03-16 ENCOUNTER — Other Ambulatory Visit: Payer: Self-pay

## 2021-03-17 ENCOUNTER — Other Ambulatory Visit: Payer: Self-pay

## 2021-03-18 ENCOUNTER — Ambulatory Visit (INDEPENDENT_AMBULATORY_CARE_PROVIDER_SITE_OTHER): Payer: Medicare Other | Admitting: Adult Health

## 2021-03-18 ENCOUNTER — Encounter: Payer: Self-pay | Admitting: Adult Health

## 2021-03-18 VITALS — BP 120/70 | HR 50 | Temp 97.9°F | Ht 69.0 in | Wt 177.0 lb

## 2021-03-18 DIAGNOSIS — M545 Low back pain, unspecified: Secondary | ICD-10-CM | POA: Diagnosis not present

## 2021-03-18 DIAGNOSIS — Z4802 Encounter for removal of sutures: Secondary | ICD-10-CM

## 2021-03-18 MED ORDER — TRAMADOL HCL 50 MG PO TABS
50.0000 mg | ORAL_TABLET | Freq: Three times a day (TID) | ORAL | 0 refills | Status: AC | PRN
Start: 2021-03-18 — End: 2021-03-23
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
## 2021-03-24 ENCOUNTER — Ambulatory Visit (INDEPENDENT_AMBULATORY_CARE_PROVIDER_SITE_OTHER): Payer: Medicare Other

## 2021-03-24 DIAGNOSIS — Z0000 Encounter for general adult medical examination without abnormal findings: Secondary | ICD-10-CM

## 2021-03-24 NOTE — Patient Instructions (Signed)
[name] , Thank you for taking time to come for your Medicare Wellness Visit. I appreciate your ongoing commitment to your health goals. Please review the following plan we discussed and let me know if I can assist you in the future.   Screening recommendations/referrals: Colonoscopy: per patient declined due to health  Recommended yearly ophthalmology/optometry visit for glaucoma screening and checkup Recommended yearly dental visit for hygiene and checkup  Vaccinations: Influenza vaccine: completed  Pneumococcal vaccine: completed  Tdap vaccine: 03/06/2021 Shingles vaccine: completed     Advanced directives: in records   Conditions/risks identified: none   Next appointment: none   Preventive Care 74 Years and Older, Male Preventive care refers to lifestyle choices and visits with your health care provider that can promote health and wellness. What does preventive care include? A yearly physical exam. This is also called an annual well check. Dental exams once or twice a year. Routine eye exams. Ask your health care provider how often you should have your eyes checked. Personal lifestyle choices, including: Daily care of your teeth and gums. Regular physical activity. Eating a healthy diet. Avoiding tobacco and drug use. Limiting alcohol use. Practicing safe sex. Taking low doses of aspirin every day. Taking vitamin and mineral supplements as recommended by your health care provider. What happens during an annual well check? The services and screenings done by your health care provider during your annual well check will depend on your age, overall health, lifestyle risk factors, and family history of disease. Counseling  Your health care provider may ask you questions about your: Alcohol use. Tobacco use. Drug use. Emotional well-being. Home and relationship well-being. Sexual activity. Eating habits. History of falls. Memory and ability to understand  (cognition). Work and work Statistician. Screening  You may have the following tests or measurements: Height, weight, and BMI. Blood pressure. Lipid and cholesterol levels. These may be checked every 5 years, or more frequently if you are over 43 years old. Skin check. Lung cancer screening. You may have this screening every year starting at age 91 if you have a 30-pack-year history of smoking and currently smoke or have quit within the past 15 years. Fecal occult blood test (FOBT) of the stool. You may have this test every year starting at age 47. Flexible sigmoidoscopy or colonoscopy. You may have a sigmoidoscopy every 5 years or a colonoscopy every 10 years starting at age 30. Prostate cancer screening. Recommendations will vary depending on your family history and other risks. Hepatitis C blood test. Hepatitis B blood test. Sexually transmitted disease (STD) testing. Diabetes screening. This is done by checking your blood sugar (glucose) after you have not eaten for a while (fasting). You may have this done every 1-3 years. Abdominal aortic aneurysm (AAA) screening. You may need this if you are a current or former smoker. Osteoporosis. You may be screened starting at age 36 if you are at high risk. Talk with your health care provider about your test results, treatment options, and if necessary, the need for more tests. Vaccines  Your health care provider may recommend certain vaccines, such as: Influenza vaccine. This is recommended every year. Tetanus, diphtheria, and acellular pertussis (Tdap, Td) vaccine. You may need a Td booster every 10 years. Zoster vaccine. You may need this after age 38. Pneumococcal 13-valent conjugate (PCV13) vaccine. One dose is recommended after age 67. Pneumococcal polysaccharide (PPSV23) vaccine. One dose is recommended after age 88. Talk to your health care provider about which screenings and vaccines  you need and how often you need them. This  information is not intended to replace advice given to you by your health care provider. Make sure you discuss any questions you have with your health care provider. Document Released: 06/04/2015 Document Revised: 01/26/2016 Document Reviewed: 03/09/2015 Elsevier Interactive Patient Education  2017 [place] Prevention in the Home Falls can cause injuries. They can happen to people of all ages. There are many things you can do to make your home safe and to help prevent falls. What can I do on the outside of my home? Regularly fix the edges of walkways and driveways and fix any cracks. Remove anything that might make you trip as you walk through a door, such as a raised step or threshold. Trim any bushes or trees on the path to your home. Use bright outdoor lighting. Clear any walking paths of anything that might make someone trip, such as rocks or tools. Regularly check to see if handrails are loose or broken. Make sure that both sides of any steps have handrails. Any raised decks and porches should have guardrails on the edges. Have any leaves, snow, or ice cleared regularly. Use sand or salt on walking paths during winter. Clean up any spills in your garage right away. This includes oil or grease spills. What can I do in the bathroom? Use night lights. Install grab bars by the toilet and in the tub and shower. Do not use towel bars as grab bars. Use non-skid mats or decals in the tub or shower. If you need to sit down in the shower, use a plastic, non-slip stool. Keep the floor dry. Clean up any water that spills on the floor as soon as it happens. Remove soap buildup in the tub or shower regularly. Attach bath mats securely with double-sided non-slip rug tape. Do not have throw rugs and other things on the floor that can make you trip. What can I do in the bedroom? Use night lights. Make sure that you have a light by your bed that is easy to reach. Do not use any sheets or  blankets that are too big for your bed. They should not hang down onto the floor. Have a firm chair that has side arms. You can use this for support while you get dressed. Do not have throw rugs and other things on the floor that can make you trip. What can I do in the kitchen? Clean up any spills right away. Avoid walking on wet floors. Keep items that you use a lot in easy-to-reach places. If you need to reach something above you, use a strong step stool that has a grab bar. Keep electrical cords out of the way. Do not use floor polish or wax that makes floors slippery. If you must use wax, use non-skid floor wax. Do not have throw rugs and other things on the floor that can make you trip. What can I do with my stairs? Do not leave any items on the stairs. Make sure that there are handrails on both sides of the stairs and use them. Fix handrails that are broken or loose. Make sure that handrails are as long as the stairways. Check any carpeting to make sure that it is firmly attached to the stairs. Fix any carpet that is loose or worn. Avoid having throw rugs at the top or bottom of the stairs. If you do have throw rugs, attach them to the floor with carpet tape. Make sure  that you have a light switch at the top of the stairs and the bottom of the stairs. If you do not have them, ask someone to add them for you. What else can I do to help prevent falls? Wear shoes that: Do not have high heels. Have rubber bottoms. Are comfortable and fit you well. Are closed at the toe. Do not wear sandals. If you use a stepladder: Make sure that it is fully opened. Do not climb a closed stepladder. Make sure that both sides of the stepladder are locked into place. Ask someone to hold it for you, if possible. Clearly mark and make sure that you can see: Any grab bars or handrails. First and last steps. Where the edge of each step is. Use tools that help you move around (mobility aids) if they are  needed. These include: Canes. Walkers. Scooters. Crutches. Turn on the lights when you go into a dark area. Replace any light bulbs as soon as they burn out. Set up your furniture so you have a clear path. Avoid moving your furniture around. If any of your floors are uneven, fix them. If there are any pets around you, be aware of where they are. Review your medicines with your doctor. Some medicines can make you feel dizzy. This can increase your chance of falling. Ask your doctor what other things that you can do to help prevent falls. This information is not intended to replace advice given to you by your health care provider. Make sure you discuss any questions you have with your health care provider. Document Released: 03/04/2009 Document Revised: 10/14/2015 Document Reviewed: 06/12/2014 Elsevier Interactive Patient Education  2017 [employer].
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
## 2021-03-28 ENCOUNTER — Ambulatory Visit (INDEPENDENT_AMBULATORY_CARE_PROVIDER_SITE_OTHER): Payer: Medicare Other

## 2021-03-28 DIAGNOSIS — I255 Ischemic cardiomyopathy: Secondary | ICD-10-CM | POA: Diagnosis not present

## 2021-03-30 ENCOUNTER — Telehealth (HOSPITAL_COMMUNITY): Payer: Self-pay | Admitting: Vascular Surgery

## 2021-03-30 NOTE — Telephone Encounter (Signed)
Pt moved to [facility], they would like a referral to a cardiologist

## 2021-04-01 ENCOUNTER — Encounter: Payer: Self-pay | Admitting: Neurology

## 2021-04-01 ENCOUNTER — Encounter (HOSPITAL_COMMUNITY): Payer: Medicare Other | Admitting: Internal Medicine

## 2021-04-01 ENCOUNTER — Ambulatory Visit (HOSPITAL_COMMUNITY): Payer: Medicare Other

## 2021-04-02 ENCOUNTER — Other Ambulatory Visit: Payer: Self-pay | Admitting: Neurology

## 2021-04-02 ENCOUNTER — Other Ambulatory Visit (HOSPITAL_COMMUNITY): Payer: Self-pay | Admitting: Internal Medicine

## 2021-04-02 ENCOUNTER — Other Ambulatory Visit: Payer: Self-pay | Admitting: Adult Health

## 2021-04-02 DIAGNOSIS — E118 Type 2 diabetes mellitus with unspecified complications: Secondary | ICD-10-CM

## 2021-04-04 ENCOUNTER — Telehealth: Payer: Self-pay

## 2021-04-04 DIAGNOSIS — R079 Chest pain, unspecified: Secondary | ICD-10-CM | POA: Diagnosis not present

## 2021-04-04 DIAGNOSIS — I4891 Unspecified atrial fibrillation: Secondary | ICD-10-CM | POA: Diagnosis not present

## 2021-04-04 DIAGNOSIS — Z4502 Encounter for adjustment and management of automatic implantable cardiac defibrillator: Secondary | ICD-10-CM | POA: Diagnosis not present

## 2021-04-04 LAB — CUP PACEART REMOTE DEVICE CHECK
Battery Remaining Longevity: 156 mo
Battery Remaining Percentage: 100 %
Brady Statistic RA Percent Paced: 24 %
Brady Statistic RV Percent Paced: 1 %
Date Time Interrogation Session: 20221106172800
HighPow Impedance: 52 Ohm
Implantable Lead Implant Date: 20050922
Implantable Lead Implant Date: 20050922
Implantable Lead Location: 753859
Implantable Lead Location: 753860
Implantable Lead Model: 158
Implantable Lead Model: 5076
Implantable Lead Serial Number: 156891
Implantable Pulse Generator Implant Date: 20220505
Lead Channel Impedance Value: 438 Ohm
Lead Channel Impedance Value: 469 Ohm
Lead Channel Setting Pacing Amplitude: 2 V
Lead Channel Setting Pacing Amplitude: 2.4 V
Lead Channel Setting Pacing Pulse Width: 0.4 ms
Lead Channel Setting Sensing Sensitivity: 0.5 mV
Pulse Gen Serial Number: 232616
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
## 2021-04-04 NOTE — Telephone Encounter (Signed)
The patient wife called leaving a message that they found a doctor near them. The doctor office they found is the [facility]. Their number is [phone] and fax number [phone] for records to be sent.
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
## 2021-04-04 NOTE — Telephone Encounter (Signed)
Spoke with Dr. Rayann Heman, he advised that patient should establish with a doctor closer to where he now lives.    Attempted to return call to pt spouse, no answer, left detailed VM reiterating shock plan and advising that pt should establish with MD close to home.
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
## 2021-04-05 DIAGNOSIS — R079 Chest pain, unspecified: Secondary | ICD-10-CM | POA: Diagnosis not present

## 2021-04-05 DIAGNOSIS — I255 Ischemic cardiomyopathy: Secondary | ICD-10-CM | POA: Diagnosis not present

## 2021-04-05 DIAGNOSIS — J449 Chronic obstructive pulmonary disease, unspecified: Secondary | ICD-10-CM | POA: Diagnosis not present

## 2021-04-05 DIAGNOSIS — N1831 Chronic kidney disease, stage 3a: Secondary | ICD-10-CM | POA: Diagnosis not present

## 2021-04-05 DIAGNOSIS — N2581 Secondary hyperparathyroidism of renal origin: Secondary | ICD-10-CM | POA: Diagnosis not present

## 2021-04-05 DIAGNOSIS — I472 Ventricular tachycardia, unspecified: Secondary | ICD-10-CM | POA: Diagnosis not present

## 2021-04-05 DIAGNOSIS — Z66 Do not resuscitate: Secondary | ICD-10-CM | POA: Diagnosis not present

## 2021-04-05 DIAGNOSIS — G40909 Epilepsy, unspecified, not intractable, without status epilepticus: Secondary | ICD-10-CM | POA: Diagnosis not present

## 2021-04-05 DIAGNOSIS — I4891 Unspecified atrial fibrillation: Secondary | ICD-10-CM | POA: Diagnosis not present

## 2021-04-05 DIAGNOSIS — E1122 Type 2 diabetes mellitus with diabetic chronic kidney disease: Secondary | ICD-10-CM | POA: Diagnosis not present

## 2021-04-05 DIAGNOSIS — I499 Cardiac arrhythmia, unspecified: Secondary | ICD-10-CM | POA: Diagnosis not present

## 2021-04-05 DIAGNOSIS — Z4502 Encounter for adjustment and management of automatic implantable cardiac defibrillator: Secondary | ICD-10-CM | POA: Diagnosis not present

## 2021-04-05 DIAGNOSIS — I130 Hypertensive heart and chronic kidney disease with heart failure and stage 1 through stage 4 chronic kidney disease, or unspecified chronic kidney disease: Secondary | ICD-10-CM | POA: Diagnosis not present

## 2021-04-05 DIAGNOSIS — J849 Interstitial pulmonary disease, unspecified: Secondary | ICD-10-CM | POA: Diagnosis not present

## 2021-04-05 DIAGNOSIS — I5023 Acute on chronic systolic (congestive) heart failure: Secondary | ICD-10-CM | POA: Diagnosis not present

## 2021-04-05 NOTE — Progress Notes (Signed)
Remote ICD transmission.   
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
## 2021-04-06 ENCOUNTER — Telehealth (HOSPITAL_COMMUNITY): Payer: Self-pay | Admitting: *Deleted
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
## 2021-04-06 NOTE — Telephone Encounter (Signed)
Pts wife called to inform our office pt is admitted to [facility]. She asked that Dr.Bensimhon call her and the hospital because she has not received any information about pt. She said he went in for a cath and now they have placed stents even though she wasn't aware he had blockages. She said she is worried and she trusts Dr.Bensimhon.   Routed to [place]   Call back # [phone]

## 2021-04-11 ENCOUNTER — Ambulatory Visit: Payer: Medicare Other | Admitting: Podiatry

## 2021-04-19 NOTE — Progress Notes (Signed)
Unable to reach patient for monthly ICM remote follow up due to he has [place] out of state and receiving follow up care.  Patient disenrolled from [facility] monthly follow up.  Device clinic 91 day remote monitoring will continue per protocol.

## 2021-04-22 ENCOUNTER — Telehealth: Payer: Self-pay

## 2021-04-26 ENCOUNTER — Other Ambulatory Visit: Payer: Self-pay | Admitting: Adult Health

## 2021-04-26 DIAGNOSIS — I480 Paroxysmal atrial fibrillation: Secondary | ICD-10-CM

## 2021-05-03 ENCOUNTER — Other Ambulatory Visit (HOSPITAL_COMMUNITY): Payer: Self-pay | Admitting: Internal Medicine
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
## 2021-05-09 ENCOUNTER — Encounter: Payer: Medicare Other | Admitting: Student

## 2021-05-09 DIAGNOSIS — I472 Ventricular tachycardia, unspecified: Secondary | ICD-10-CM

## 2021-05-09 DIAGNOSIS — I2510 Atherosclerotic heart disease of native coronary artery without angina pectoris: Secondary | ICD-10-CM

## 2021-05-09 DIAGNOSIS — I255 Ischemic cardiomyopathy: Secondary | ICD-10-CM

## 2021-05-09 DIAGNOSIS — I5022 Chronic systolic (congestive) heart failure: Secondary | ICD-10-CM

## 2021-05-11 ENCOUNTER — Telehealth: Payer: Self-pay | Admitting: Internal Medicine

## 2021-05-11 ENCOUNTER — Other Ambulatory Visit: Payer: Self-pay | Admitting: Internal Medicine

## 2021-05-11 NOTE — Telephone Encounter (Signed)
[name] with [facility] states they received a referral from [facility] for afib. He states [facility] did not provide any records pertaining to the afib and he would like to have records from our office sent.   Are you calling in reference to your FMLA or disability form?  No    What is your question in regards to FMLA or disability form?  No    Do you need copies of your medical records?  See above    Are you waiting on a nurse to call you back with results or are you wanting copies of your results?  No

## 2021-05-18 NOTE — Telephone Encounter (Signed)
Lamar from [facility] was calling to follow up on the request for medical records

## 2021-05-31 ENCOUNTER — Ambulatory Visit: Payer: Medicare Other | Admitting: Dermatology

## 2021-06-17 ENCOUNTER — Encounter (HOSPITAL_COMMUNITY): Payer: Medicare Other | Admitting: Internal Medicine

## 2021-06-17 ENCOUNTER — Ambulatory Visit (HOSPITAL_COMMUNITY): Admission: RE | Admit: 2021-06-17 | Payer: Self-pay | Source: Ambulatory Visit

## 2021-06-27 ENCOUNTER — Ambulatory Visit (INDEPENDENT_AMBULATORY_CARE_PROVIDER_SITE_OTHER): Payer: Self-pay

## 2021-06-27 DIAGNOSIS — I255 Ischemic cardiomyopathy: Secondary | ICD-10-CM

## 2021-06-28 LAB — CUP PACEART REMOTE DEVICE CHECK
Battery Remaining Longevity: 156 mo
Battery Remaining Percentage: 100 %
Brady Statistic RA Percent Paced: 47 %
Brady Statistic RV Percent Paced: 0 %
Date Time Interrogation Session: 20230206194100
HighPow Impedance: 58 Ohm
Implantable Lead Implant Date: 20050922
Implantable Lead Implant Date: 20050922
Implantable Lead Location: 753859
Implantable Lead Location: 753860
Implantable Lead Model: 158
Implantable Lead Model: 5076
Implantable Lead Serial Number: 156891
Implantable Pulse Generator Implant Date: 20220505
Lead Channel Impedance Value: 440 Ohm
Lead Channel Impedance Value: 489 Ohm
Lead Channel Setting Pacing Amplitude: 2 V
Lead Channel Setting Pacing Amplitude: 2.4 V
Lead Channel Setting Pacing Pulse Width: 0.4 ms
Lead Channel Setting Sensing Sensitivity: 0.5 mV
Pulse Gen Serial Number: 232616

## 2021-06-30 NOTE — Progress Notes (Signed)
Remote ICD transmission.   

## 2021-08-31 ENCOUNTER — Ambulatory Visit: Payer: Medicare Other | Admitting: Neurology

## 2021-09-15 ENCOUNTER — Encounter: Payer: Self-pay | Admitting: Neurology

## 2021-09-15 ENCOUNTER — Telehealth (INDEPENDENT_AMBULATORY_CARE_PROVIDER_SITE_OTHER): Payer: Medicare Other | Admitting: Neurology

## 2021-09-15 VITALS — Ht 69.0 in | Wt 177.0 lb

## 2021-09-15 DIAGNOSIS — F01A3 Vascular dementia, mild, with mood disturbance: Secondary | ICD-10-CM

## 2021-09-15 DIAGNOSIS — G40209 Localization-related (focal) (partial) symptomatic epilepsy and epileptic syndromes with complex partial seizures, not intractable, without status epilepticus: Secondary | ICD-10-CM

## 2021-09-15 MED ORDER — MEMANTINE HCL 10 MG PO TABS
10.0000 mg | ORAL_TABLET | Freq: Two times a day (BID) | ORAL | 3 refills | Status: DC
Start: 2021-09-15 — End: 2022-03-23

## 2021-09-15 MED ORDER — VIMPAT 100 MG PO TABS: ORAL_TABLET | ORAL | 3 refills | Status: DC
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
## 2021-09-15 NOTE — Patient Instructions (Signed)
Good to see you doing better. ?  ?Continue Vimpat '100mg'$  twice a day and Memantine '10mg'$  twice a day, refills sent. ? ?2. Follow-up in 6 months, call for any changes ? ? ?Seizure Precautions: ?1. If medication has been prescribed for you to prevent seizures, take it exactly as directed.  Do not stop taking the medicine without talking to your doctor first, even if you have not had a seizure in a long time.  ? ?2. Avoid activities in which a seizure would cause danger to yourself or to others.  Don't operate dangerous machinery, swim alone, or climb in high or dangerous places, such as on ladders, roofs, or girders.  Do not drive unless your doctor says you may. ? ?3. If you have any warning that you may have a seizure, lay down in a safe place where you can't hurt yourself.   ? ?4.  No driving for 6 months from last seizure, as per [facility].   Please refer to the following link on the [place] website for more information: http://[email or web address].org/[email or web address]/driving/drivingu.cfm  ? ?5.  Maintain good sleep hygiene. Avoid alcohol. ? ?6.  Contact your doctor if you have any problems that may be related to the medicine you are taking. ? ?7.  Call 911 and bring the patient back to the ED if: ?      ? A.  The seizure lasts longer than 5 minutes.      ? B.  The patient doesn't awaken shortly after the seizure ? C.  The patient has new problems such as difficulty seeing, speaking or moving ? D.  The patient was injured during the seizure ? E.  The patient has a temperature over 102 F (39C) ? F.  The patient vomited and now is having trouble breathing ?      ? ?FALL PRECAUTIONS: Be cautious when walking. Scan the area for obstacles that may increase the risk of trips and falls. When getting up in the mornings, sit up at the edge of the bed for a few minutes before getting out of bed. Consider elevating the bed at the head end to avoid drop of blood pressure when getting  up. Walk always in a well-lit room (use night lights in the walls). Avoid area rugs or power cords from appliances in the middle of the walkways. Use a walker or a cane if necessary and consider physical therapy for balance exercise. Get your eyesight checked regularly. ? ?FINANCIAL OVERSIGHT: Supervision, especially oversight when making financial decisions or transactions is also recommended. ? ?HOME SAFETY: Consider the safety of the kitchen when operating appliances like stoves, microwave oven, and blender. Consider having supervision and share cooking responsibilities until no longer able to participate in those. Accidents with firearms and other hazards in the house should be identified and addressed as well. ? ?DRIVING: Regarding driving, in patients with progressive memory problems, driving will be impaired. We advise to have someone else do the driving if trouble finding directions or if minor accidents are reported. Independent driving assessment is available to determine safety of driving. ? ?ABILITY TO BE LEFT ALONE: If patient is unable to contact 911 operator, consider using LifeLine, or when the need is there, arrange for someone to stay with patients. Smoking is a fire hazard, consider supervision or cessation. Risk of wandering should be assessed by caregiver and if detected at any point, supervision and safe proof recommendations should be instituted. ? ?MEDICATION SUPERVISION: Inability to  self-administer medication needs to be constantly addressed. Implement a mechanism to ensure safe administration of the medications. ? ?RECOMMENDATIONS FOR ALL PATIENTS WITH MEMORY PROBLEMS: ?1. Continue to exercise (Recommend 30 minutes of walking everyday, or 3 hours every week) ?2. Increase social interactions - continue going to [place] and enjoy social gatherings with friends and family ?3. Eat healthy, avoid fried foods and eat more fruits and vegetables ?4. Maintain adequate blood pressure, blood sugar,  and blood cholesterol level. Reducing the risk of stroke and cardiovascular disease also helps promoting better memory. ?5. Avoid stressful situations. Live a simple life and avoid aggravations. Organize your time and prepare for the next day in anticipation. ?6. Sleep well, avoid any interruptions of sleep and avoid any distractions in the bedroom that may interfere with adequate sleep quality ?7. Avoid sugar, avoid sweets as there is a strong link between excessive sugar intake, diabetes, and cognitive impairment ?The Mediterranean diet has been shown to help patients reduce the risk of progressive memory disorders and reduces cardiovascular risk. This includes eating fish, eat fruits and green leafy vegetables, nuts like almonds and hazelnuts, walnuts, and also use olive oil. Avoid fast foods and fried foods as much as possible. Avoid sweets and sugar as sugar use has been linked to worsening of memory function. ? ?There is always a concern of gradual progression of memory problems. If this is the case, then we may need to adjust level of care according to patient needs. Support, both to the patient and caregiver, should then be put into place. ? ?

## 2021-09-26 ENCOUNTER — Ambulatory Visit (INDEPENDENT_AMBULATORY_CARE_PROVIDER_SITE_OTHER): Payer: Medicare Other

## 2021-09-26 DIAGNOSIS — I255 Ischemic cardiomyopathy: Secondary | ICD-10-CM

## 2021-09-26 LAB — CUP PACEART REMOTE DEVICE CHECK
Battery Remaining Longevity: 156 mo
Battery Remaining Percentage: 100 %
Brady Statistic RA Percent Paced: 47 %
Brady Statistic RV Percent Paced: 0 %
Date Time Interrogation Session: 20230508031100
HighPow Impedance: 60 Ohm
Implantable Lead Implant Date: 20050922
Implantable Lead Implant Date: 20050922
Implantable Lead Location: 753859
Implantable Lead Location: 753860
Implantable Lead Model: 158
Implantable Lead Model: 5076
Implantable Lead Serial Number: 156891
Implantable Pulse Generator Implant Date: 20220505
Lead Channel Impedance Value: 463 Ohm
Lead Channel Impedance Value: 474 Ohm
Lead Channel Setting Pacing Amplitude: 2 V
Lead Channel Setting Pacing Amplitude: 2.4 V
Lead Channel Setting Pacing Pulse Width: 0.4 ms
Lead Channel Setting Sensing Sensitivity: 0.5 mV
Pulse Gen Serial Number: 232616

## 2021-10-03 ENCOUNTER — Telehealth: Payer: Self-pay

## 2021-10-03 NOTE — Telephone Encounter (Signed)
Outreach made to family. ?They are currently in the ER where they currently reside. ? ?Will fax information to:  [phone] ? ?Sent last office note and complete device report. ? ?BSC alert Accelerated ventricular arrhythmia episode. ?Antitachycardia pacing (ATP) therapy delivered to convert arrhythmia. ?Ventricular shock therapy delivered to convert arrhythmia. ?Event occurred 5/13 @ 00:43, EGM shows sustained VT, rate 192, ATP x1 unsuccessful, HV therapy 41J converting rhythm to AS/VS ?HL=2 ? ?Presenting rhythm: ? ? ? ? ? ? ? ? ? ?

## 2021-10-13 NOTE — Progress Notes (Signed)
Remote ICD transmission.   

## 2021-11-17 ENCOUNTER — Other Ambulatory Visit (HOSPITAL_COMMUNITY): Payer: Self-pay | Admitting: *Deleted

## 2021-11-17 DIAGNOSIS — I5022 Chronic systolic (congestive) heart failure: Secondary | ICD-10-CM

## 2021-11-18 ENCOUNTER — Encounter (HOSPITAL_COMMUNITY): Payer: Self-pay | Admitting: Internal Medicine

## 2021-11-18 ENCOUNTER — Ambulatory Visit (HOSPITAL_COMMUNITY): Payer: Medicare Other

## 2021-12-13 ENCOUNTER — Telehealth: Payer: Self-pay

## 2021-12-13 NOTE — Telephone Encounter (Signed)
Patient wife called back returning call and would like a call back

## 2021-12-13 NOTE — Telephone Encounter (Signed)
Latitude alert for ICD shock. EGM shows VT with avg rate 180-200bpm, ATP x3 (unsuccessful), 41J shock converted rhythm ApVs. Normal device function. Presenting rhythm ApVs. OAC- Eliquis, on Amiodarone. Routing for further review.  Patient was seen at [facility] during date of shock. Unable to load notes. Attempted to contact patient to discuss. No answer, LMTCB.

## 2021-12-13 NOTE — Telephone Encounter (Signed)
Spoke to patients wife who is on [place].  Wife states patient made adjustments on patients device while in [place]. Offered in-clinic apt. For follow up, Wife is agreeable. Advised I will forward to scheduling for in-clinic check. Voiced understanding. Aware of no driving per [place] DMV x6 months also shock plan reviewed.

## 2021-12-23 ENCOUNTER — Ambulatory Visit (INDEPENDENT_AMBULATORY_CARE_PROVIDER_SITE_OTHER): Payer: Medicare Other | Admitting: Internal Medicine

## 2021-12-23 ENCOUNTER — Encounter: Payer: Self-pay | Admitting: Internal Medicine

## 2021-12-23 VITALS — BP 128/80 | HR 62 | Ht 69.0 in | Wt 162.4 lb

## 2021-12-23 DIAGNOSIS — I472 Ventricular tachycardia, unspecified: Secondary | ICD-10-CM | POA: Diagnosis not present

## 2021-12-23 DIAGNOSIS — I255 Ischemic cardiomyopathy: Secondary | ICD-10-CM

## 2021-12-23 DIAGNOSIS — I480 Paroxysmal atrial fibrillation: Secondary | ICD-10-CM

## 2021-12-23 DIAGNOSIS — D6869 Other thrombophilia: Secondary | ICD-10-CM

## 2021-12-23 LAB — CUP PACEART INCLINIC DEVICE CHECK
Date Time Interrogation Session: 20230804144128
HighPow Impedance: 58 Ohm
HighPow Impedance: 70 Ohm
Implantable Lead Implant Date: 20050922
Implantable Lead Implant Date: 20050922
Implantable Lead Location: 753859
Implantable Lead Location: 753860
Implantable Lead Model: 158
Implantable Lead Model: 5076
Implantable Lead Serial Number: 156891
Implantable Pulse Generator Implant Date: 20220505
Lead Channel Impedance Value: 469 Ohm
Lead Channel Impedance Value: 480 Ohm
Lead Channel Pacing Threshold Amplitude: 0.6 V
Lead Channel Pacing Threshold Amplitude: 1.3 V
Lead Channel Pacing Threshold Pulse Width: 0.4 ms
Lead Channel Pacing Threshold Pulse Width: 0.4 ms
Lead Channel Sensing Intrinsic Amplitude: 1.9 mV
Lead Channel Sensing Intrinsic Amplitude: 23.7 mV
Lead Channel Setting Pacing Amplitude: 2 V
Lead Channel Setting Pacing Amplitude: 2.4 V
Lead Channel Setting Pacing Pulse Width: 0.4 ms
Lead Channel Setting Sensing Sensitivity: 0.5 mV
Pulse Gen Serial Number: 232616

## 2021-12-23 NOTE — Patient Instructions (Addendum)
Medication Instructions:  Continue all current medications.  Labwork: none  Testing/Procedures: none  Follow-Up: 3 months   Any Other Special Instructions Will Be Listed Below (If Applicable). NO DRIVING X 6 MONTHS   If you need a refill on your cardiac medications before your next appointment, please call your pharmacy.
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
## 2021-12-26 ENCOUNTER — Ambulatory Visit (INDEPENDENT_AMBULATORY_CARE_PROVIDER_SITE_OTHER): Payer: Medicare Other

## 2021-12-26 DIAGNOSIS — I255 Ischemic cardiomyopathy: Secondary | ICD-10-CM

## 2021-12-27 ENCOUNTER — Encounter: Payer: Self-pay | Admitting: Neurology

## 2021-12-27 ENCOUNTER — Telehealth: Payer: Self-pay

## 2021-12-27 NOTE — Telephone Encounter (Signed)
I spoke with the patient wife and she wants [place] to follow the patient remotely. I added him into our Latitude.

## 2021-12-29 LAB — CUP PACEART REMOTE DEVICE CHECK
Battery Remaining Longevity: 126 mo
Battery Remaining Percentage: 100 %
Brady Statistic RA Percent Paced: 53 %
Brady Statistic RV Percent Paced: 0 %
Date Time Interrogation Session: 20230807031200
HighPow Impedance: 63 Ohm
Implantable Lead Implant Date: 20050922
Implantable Lead Implant Date: 20050922
Implantable Lead Location: 753859
Implantable Lead Location: 753860
Implantable Lead Model: 158
Implantable Lead Model: 5076
Implantable Lead Serial Number: 156891
Implantable Pulse Generator Implant Date: 20220505
Lead Channel Impedance Value: 461 Ohm
Lead Channel Impedance Value: 471 Ohm
Lead Channel Pacing Threshold Amplitude: 0.5 V
Lead Channel Pacing Threshold Amplitude: 1.2 V
Lead Channel Pacing Threshold Pulse Width: 0.4 ms
Lead Channel Pacing Threshold Pulse Width: 0.4 ms
Lead Channel Setting Pacing Amplitude: 2 V
Lead Channel Setting Pacing Amplitude: 2.6 V
Lead Channel Setting Pacing Pulse Width: 0.4 ms
Lead Channel Setting Sensing Sensitivity: 0.5 mV
Pulse Gen Serial Number: 232616

## 2021-12-29 NOTE — Telephone Encounter (Signed)
Patient offered earlier appt, wife was going to check.

## 2022-01-05 ENCOUNTER — Telehealth: Payer: Self-pay | Admitting: Internal Medicine

## 2022-01-05 ENCOUNTER — Telehealth: Payer: Self-pay

## 2022-01-05 NOTE — Telephone Encounter (Signed)
Left message to callback to confirm dosage of Ranexa.

## 2022-01-05 NOTE — Telephone Encounter (Signed)
The patient wife states she is back at home and would like for the nurse to give her a call back.

## 2022-01-05 NOTE — Telephone Encounter (Signed)
Wife returned RN's call. 

## 2022-01-05 NOTE — Telephone Encounter (Signed)
Pt c/o medication issue:  1. Name of Medication:   ranolazine (RANEXA) 500 MG 12 hr tablet    2. How are you currently taking this medication (dosage and times per day)? Take 500 mg by mouth 2 (two) times daily  3. Are you having a reaction (difficulty breathing--STAT)? No  4. What is your medication issue? Spouse wanted to make provider aware that pt's Cardiologist in [place] upped medication. Please advise

## 2022-01-05 NOTE — Telephone Encounter (Signed)
Spoke with patient's wife, OK per DPR.  [name] states patient was notified by Jackelyn Poling, nurse for Dr. Nino Glow at [facility] in [name], that patient had an episode of ventricular tachycardia last month and they are increasing Ranexa to '1000mg'$  BID.  [name] states patient has been fine, no symptoms, no chest pain, and she is concerned about this medication adjustment. She would like to know if any events were recorded on pacemaker from this month and if Dr. Rayann Heman agrees with increased dose of Ranexa.  Will forward to Device team and Dr. Rayann Heman to review and advise.

## 2022-01-05 NOTE — Telephone Encounter (Signed)
The patient wife called  and  LMOVM to see if any V-tach showed up on the patient 01/03/2022 transmission.

## 2022-01-05 NOTE — Telephone Encounter (Signed)
Returning phone call.    Normal device function. No episodes noted on 01/03/22 transmission. No answer, LMTCB.

## 2022-01-06 NOTE — Telephone Encounter (Signed)
I spoke with the patient and they wanted the 01/03/2022 transmission reviewed.

## 2022-01-26 NOTE — Progress Notes (Signed)
Remote ICD transmission.   

## 2022-02-17 ENCOUNTER — Encounter (HOSPITAL_COMMUNITY): Payer: Self-pay | Admitting: Internal Medicine

## 2022-02-17 ENCOUNTER — Ambulatory Visit (HOSPITAL_COMMUNITY)
Admission: RE | Admit: 2022-02-17 | Discharge: 2022-02-17 | Disposition: A | Discharge status: Home / Self Care | Payer: Medicare Other | Source: Ambulatory Visit | Attending: Internal Medicine | Admitting: Internal Medicine

## 2022-02-17 ENCOUNTER — Ambulatory Visit (HOSPITAL_BASED_OUTPATIENT_CLINIC_OR_DEPARTMENT_OTHER)
Admission: RE | Admit: 2022-02-17 | Discharge: 2022-02-17 | Disposition: A | Discharge status: Home / Self Care | Payer: Medicare Other | Source: Ambulatory Visit | Attending: Internal Medicine | Admitting: Internal Medicine

## 2022-02-17 ENCOUNTER — Other Ambulatory Visit (HOSPITAL_COMMUNITY): Payer: Self-pay

## 2022-02-17 VITALS — BP 112/72 | HR 60 | Wt 161.0 lb

## 2022-02-17 DIAGNOSIS — Z8673 Personal history of transient ischemic attack (TIA), and cerebral infarction without residual deficits: Secondary | ICD-10-CM | POA: Diagnosis not present

## 2022-02-17 DIAGNOSIS — Z7901 Long term (current) use of anticoagulants: Secondary | ICD-10-CM | POA: Diagnosis not present

## 2022-02-17 DIAGNOSIS — Z95810 Presence of automatic (implantable) cardiac defibrillator: Secondary | ICD-10-CM | POA: Diagnosis not present

## 2022-02-17 DIAGNOSIS — I130 Hypertensive heart and chronic kidney disease with heart failure and stage 1 through stage 4 chronic kidney disease, or unspecified chronic kidney disease: Secondary | ICD-10-CM | POA: Insufficient documentation

## 2022-02-17 DIAGNOSIS — J449 Chronic obstructive pulmonary disease, unspecified: Secondary | ICD-10-CM | POA: Insufficient documentation

## 2022-02-17 DIAGNOSIS — Z79899 Other long term (current) drug therapy: Secondary | ICD-10-CM | POA: Insufficient documentation

## 2022-02-17 DIAGNOSIS — Z4502 Encounter for adjustment and management of automatic implantable cardiac defibrillator: Secondary | ICD-10-CM | POA: Diagnosis not present

## 2022-02-17 DIAGNOSIS — I480 Paroxysmal atrial fibrillation: Secondary | ICD-10-CM | POA: Diagnosis not present

## 2022-02-17 DIAGNOSIS — N1832 Chronic kidney disease, stage 3b: Secondary | ICD-10-CM | POA: Insufficient documentation

## 2022-02-17 DIAGNOSIS — G4733 Obstructive sleep apnea (adult) (pediatric): Secondary | ICD-10-CM | POA: Insufficient documentation

## 2022-02-17 DIAGNOSIS — R079 Chest pain, unspecified: Secondary | ICD-10-CM | POA: Diagnosis not present

## 2022-02-17 DIAGNOSIS — I472 Ventricular tachycardia, unspecified: Secondary | ICD-10-CM | POA: Diagnosis not present

## 2022-02-17 DIAGNOSIS — E1122 Type 2 diabetes mellitus with diabetic chronic kidney disease: Secondary | ICD-10-CM | POA: Insufficient documentation

## 2022-02-17 DIAGNOSIS — I2510 Atherosclerotic heart disease of native coronary artery without angina pectoris: Secondary | ICD-10-CM | POA: Insufficient documentation

## 2022-02-17 DIAGNOSIS — I5022 Chronic systolic (congestive) heart failure: Secondary | ICD-10-CM | POA: Diagnosis not present

## 2022-02-17 DIAGNOSIS — Z955 Presence of coronary angioplasty implant and graft: Secondary | ICD-10-CM | POA: Diagnosis not present

## 2022-02-17 DIAGNOSIS — R251 Tremor, unspecified: Secondary | ICD-10-CM | POA: Insufficient documentation

## 2022-02-17 LAB — BASIC METABOLIC PANEL
Anion gap: 8 (ref 5–15)
BUN: 19 mg/dL (ref 8–23)
CO2: 26 mmol/L (ref 22–32)
Calcium: 9.7 mg/dL (ref 8.9–10.3)
Chloride: 103 mmol/L (ref 98–111)
Creatinine, Ser: 1.28 mg/dL — ABNORMAL HIGH (ref 0.61–1.24)
GFR, Estimated: 58 mL/min — ABNORMAL LOW (ref >60)
Glucose, Bld: 143 mg/dL — ABNORMAL HIGH (ref 70–99)
Potassium: 3.6 mmol/L (ref 3.5–5.1)
Sodium: 137 mmol/L (ref 135–145)

## 2022-02-17 LAB — ECHOCARDIOGRAM COMPLETE
AR max vel: 2.07 cm2
AV Area VTI: 2.02 cm2
AV Area mean vel: 2 cm2
AV Mean grad: 3 mmHg
AV Peak grad: 5.1 mmHg
Ao pk vel: 1.13 m/s
Area-P 1/2: 3.77 cm2
Calc EF: 28.3 %
MV M vel: 4.29 m/s
MV Peak grad: 73.6 mmHg
S' Lateral: 5.2 cm
Single Plane A2C EF: 36.9 %
Single Plane A4C EF: 19.9 %

## 2022-02-17 LAB — CBC
HCT: 41.6 % (ref 39.0–52.0)
Hemoglobin: 13.9 g/dL (ref 13.0–17.0)
MCH: 31.6 pg (ref 26.0–34.0)
MCHC: 33.4 g/dL (ref 30.0–36.0)
MCV: 94.5 fL (ref 80.0–100.0)
Platelets: 191 10*3/uL (ref 150–400)
RBC: 4.4 MIL/uL (ref 4.22–5.81)
RDW: 13.9 % (ref 11.5–15.5)
WBC: 7.2 10*3/uL (ref 4.0–10.5)
nRBC: 0 % (ref 0.0–0.2)

## 2022-02-17 LAB — BRAIN NATRIURETIC PEPTIDE: B Natriuretic Peptide: 110.9 pg/mL — ABNORMAL HIGH (ref 0.0–100.0)

## 2022-02-17 MED ORDER — EMPAGLIFLOZIN 10 MG PO TABS: 10.0000 mg | ORAL_TABLET | Freq: Every day | ORAL | 11 refills | Status: DC
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
## 2022-02-17 NOTE — Patient Instructions (Signed)
Start Jardinace 10 mg daily.  Labs done today, your results will be available in MyChart, we will contact you for abnormal readings.  Your physician has recommended that you have a cardiopulmonary stress test (CPX). CPX testing is a non-invasive measurement of heart and lung function. It replaces a traditional treadmill stress test. This type of test provides a tremendous amount of information that relates not only to your present condition but also for future outcomes. This test combines measurements of you ventilation, respiratory gas exchange in the lungs, electrocardiogram (EKG), blood pressure and physical response before, during, and following an exercise protocol.  Your physician recommends that you schedule a follow-up appointment in: 4 months ( January 2024)  ** please call the office in November to arrange your follow up appointment **   If you have any questions or concerns before your next appointment please send [place] a message through [place] or call our office at [phone].    TO LEAVE A MESSAGE FOR THE NURSE SELECT OPTION 2, PLEASE LEAVE A MESSAGE INCLUDING: YOUR NAME DATE OF BIRTH CALL BACK NUMBER REASON FOR CALL**this is important as we prioritize the call backs  YOU WILL RECEIVE A CALL BACK THE SAME DAY AS LONG AS YOU CALL BEFORE 4:00 PM  At the [facility], you and your health needs are our priority. As part of our continuing mission to provide you with exceptional heart care, we have created designated Provider Care Teams. These Care Teams include your primary Cardiologist (physician) and Advanced Practice Providers (APPs- Physician Assistants and Nurse Practitioners) who all work together to provide you with the care you need, when you need it.   You may see any of the following providers on your designated Care Team at your next follow up: Dr Glori Bickers Dr Loralie Champagne Dr. Roxana Hires, NP Lyda Jester, [place] [facility] [place], [place] Forestine Na, NP Audry Riles, PharmD   Please be sure to bring in all your medications bottles to every appointment.

## 2022-02-27 ENCOUNTER — Encounter (HOSPITAL_COMMUNITY): Payer: Self-pay | Admitting: Internal Medicine

## 2022-02-28 ENCOUNTER — Encounter: Payer: Self-pay | Admitting: Internal Medicine

## 2022-03-03 ENCOUNTER — Telehealth (HOSPITAL_COMMUNITY): Payer: Self-pay | Admitting: Internal Medicine

## 2022-03-03 NOTE — Telephone Encounter (Signed)
Pt c/o of vomiting, no energy, and medication side effects,  pt stated she left message last week pt can be reached [phone]

## 2022-03-06 NOTE — Telephone Encounter (Signed)
Caregiver said his symptoms began after starting jardiance. She said vomiting stopped Saturday but he still has no energy.

## 2022-03-06 NOTE — Telephone Encounter (Signed)
Communicated with caregiver via mychart.

## 2022-03-06 NOTE — Telephone Encounter (Signed)
[name] to [facility] Clinical [facility] (supporting Jolaine Artist, MD)       03/03/22  7:50 PM Kinda high sorry just see this 145/93 today 145/95 yesterday's 129/85 wed if this helps Me to Roderick Calo       03/03/22 11:30 AM Bensimhon, Shaune Pascal, MD  14 minutes ago (11:15 AM)     How does his BP look. Is he feeling better?    Last read by [name] at  7:48 PM on 03/03/2022.  Bensimhon, Shaune Pascal, MD to Me      03/03/22 11:15 AM How does his BP look. Is he feeling better?  February 27, 2022       02/27/22 12:05 PM You routed this conversation to Bensimhon, Shaune Pascal, MD  [name] to [place] (supporting Jolaine Artist, MD)       02/27/22 11:17 AM The last couple days he's been throwing up for no reason still dizzy. He says he has no energy and I just wanted to reach out. I did leave a message so hopefully you'll get this through my chart.

## 2022-03-07 ENCOUNTER — Telehealth (HOSPITAL_COMMUNITY): Payer: Self-pay
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
## 2022-03-08 NOTE — Telephone Encounter (Signed)
Patient advised and verbalized understanding 

## 2022-03-16 ENCOUNTER — Other Ambulatory Visit: Payer: Self-pay | Admitting: Neurology

## 2022-03-16 ENCOUNTER — Telehealth (HOSPITAL_COMMUNITY): Payer: Self-pay | Admitting: *Deleted

## 2022-03-16 NOTE — Telephone Encounter (Signed)
Pts wife called stating pts bp is running high 170s/100s  the lowest bp is 145/85 before meds. She does not normally check his bp after meds bacause she said he is not on a bp med. I told her carvedilol and entresto can lower his bp. She checked his bbp and it was 132/80. She asked that I report this to a provider to see if he needs med changes.

## 2022-03-17 NOTE — Telephone Encounter (Signed)
Spoke with pts wife she is aware and agreeable with plan.

## 2022-03-23 ENCOUNTER — Telehealth (INDEPENDENT_AMBULATORY_CARE_PROVIDER_SITE_OTHER): Payer: Medicare Other | Admitting: Neurology

## 2022-03-23 ENCOUNTER — Encounter: Payer: Self-pay | Admitting: Neurology

## 2022-03-23 VITALS — Ht 69.0 in | Wt 159.0 lb

## 2022-03-23 DIAGNOSIS — F01A3 Vascular dementia, mild, with mood disturbance: Secondary | ICD-10-CM

## 2022-03-23 DIAGNOSIS — G40209 Localization-related (focal) (partial) symptomatic epilepsy and epileptic syndromes with complex partial seizures, not intractable, without status epilepticus: Secondary | ICD-10-CM

## 2022-03-23 MED ORDER — MEMANTINE HCL 10 MG PO TABS: 10.0000 mg | ORAL_TABLET | Freq: Two times a day (BID) | ORAL | 3 refills | Status: DC

## 2022-03-23 MED ORDER — VIMPAT 100 MG PO TABS: ORAL_TABLET | ORAL | 3 refills | Status: AC
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
## 2022-03-23 NOTE — Patient Instructions (Signed)
Good to see you. Continue all your medications. Follow-up in 6 months, call for any changes.    Seizure Precautions: 1. If medication has been prescribed for you to prevent seizures, take it exactly as directed.  Do not stop taking the medicine without talking to your doctor first, even if you have not had a seizure in a long time.   2. Avoid activities in which a seizure would cause danger to yourself or to others.  Don't operate dangerous machinery, swim alone, or climb in high or dangerous places, such as on ladders, roofs, or girders.  Do not drive unless your doctor says you may.  3. If you have any warning that you may have a seizure, lay down in a safe place where you can't hurt yourself.    4.  No driving for 6 months from last seizure, as per [facility].   Please refer to the following link on the [place] website for more information: http://[email or web address]/[email or web address]/drivingu.cfm   5.  Maintain good sleep hygiene.   6.  Contact your doctor if you have any problems that may be related to the medicine you are taking.  7.  Call 911 and bring the patient back to the ED if:        A.  The seizure lasts longer than 5 minutes.       B.  The patient doesn't awaken shortly after the seizure  C.  The patient has new problems such as difficulty seeing, speaking or moving  D.  The patient was injured during the seizure  E.  The patient has a temperature over 102 F (39C)  F.  The patient vomited and now is having trouble breathing

## 2022-03-27 ENCOUNTER — Ambulatory Visit (INDEPENDENT_AMBULATORY_CARE_PROVIDER_SITE_OTHER): Payer: Medicare Other

## 2022-03-27 DIAGNOSIS — I5022 Chronic systolic (congestive) heart failure: Secondary | ICD-10-CM

## 2022-03-28 LAB — CUP PACEART REMOTE DEVICE CHECK
Battery Remaining Longevity: 144 mo
Battery Remaining Percentage: 100 %
Brady Statistic RA Percent Paced: 58 %
Brady Statistic RV Percent Paced: 0 %
Date Time Interrogation Session: 20231106031100
HighPow Impedance: 58 Ohm
Implantable Lead Connection Status: 753985
Implantable Lead Connection Status: 753985
Implantable Lead Implant Date: 20050922
Implantable Lead Implant Date: 20050922
Implantable Lead Location: 753859
Implantable Lead Location: 753860
Implantable Lead Model: 158
Implantable Lead Model: 5076
Implantable Lead Serial Number: 156891
Implantable Pulse Generator Implant Date: 20220505
Lead Channel Impedance Value: 443 Ohm
Lead Channel Impedance Value: 486 Ohm
Lead Channel Pacing Threshold Amplitude: 0.5 V
Lead Channel Pacing Threshold Amplitude: 1.3 V
Lead Channel Pacing Threshold Pulse Width: 0.4 ms
Lead Channel Pacing Threshold Pulse Width: 0.4 ms
Lead Channel Setting Pacing Amplitude: 2 V
Lead Channel Setting Pacing Amplitude: 2.6 V
Lead Channel Setting Pacing Pulse Width: 0.4 ms
Lead Channel Setting Sensing Sensitivity: 0.5 mV
Pulse Gen Serial Number: 232616

## 2022-04-12 ENCOUNTER — Encounter: Payer: Medicare Other | Admitting: Student

## 2022-04-18 ENCOUNTER — Encounter: Payer: Medicare Other | Admitting: Cardiovascular Disease

## 2022-04-27 NOTE — Progress Notes (Signed)
Remote ICD transmission.   

## 2022-05-22 HISTORY — PX: CAROTID STENT: SHX1301

## 2022-05-22 HISTORY — PX: OTHER SURGICAL HISTORY: SHX169

## 2022-07-10 ENCOUNTER — Telehealth: Payer: Self-pay

## 2022-07-10 NOTE — Telephone Encounter (Signed)
Alert remote reviewed. Normal device function.   Only bi-ventricular pacing 50% of the time.  Sent to triage.  Next remote 08/17/2022.  Kathy Breach, RN, CCDS, [employer]  Patient is past due for follow up with A. Tillery PA-C. LM for patient to call us back to set up this week with Jonni Sanger. Can review decreased VP at time of appt.

## 2022-07-11 NOTE — Telephone Encounter (Signed)
Spoke with patient's wife.  Patient is doing great since upgrade to the CRT device on 05/18/22 that was done in [place], [place]. (Prior device implanted and followed by Dr. Rayann Heman).  He has not had official follow up after implantation and needs to get established with Dr. Myles Gip. He is not following hospital in [place] and we are handling his remotes.   Set up appt with Dr. Myles Gip for next week 07/20/22 to transition over from [place].  Also, need to evaluate decrease in BIVP to 50%, may need programming adjustment. Can assess at visit next week. Patient could not come this week due to other appts.  He is doing well, no SOB or s/s of fluid retention or concerns.   Wife given our direct device clinic number if any concerns.

## 2022-07-12 NOTE — Telephone Encounter (Signed)
Follow up scheduled

## 2022-07-20 ENCOUNTER — Encounter: Payer: Medicare Other | Admitting: Cardiovascular Disease

## 2022-08-18 ENCOUNTER — Other Ambulatory Visit: Payer: Self-pay

## 2022-08-21 ENCOUNTER — Encounter: Payer: Self-pay | Admitting: Cardiovascular Disease

## 2022-08-21 ENCOUNTER — Ambulatory Visit: Payer: Medicare Other | Attending: Cardiovascular Disease | Admitting: Cardiovascular Disease

## 2022-08-21 VITALS — BP 114/60 | HR 66 | Ht 69.0 in | Wt 173.0 lb

## 2022-08-21 DIAGNOSIS — I472 Ventricular tachycardia, unspecified: Secondary | ICD-10-CM | POA: Diagnosis not present

## 2022-08-21 DIAGNOSIS — I4891 Unspecified atrial fibrillation: Secondary | ICD-10-CM

## 2022-08-21 DIAGNOSIS — I5022 Chronic systolic (congestive) heart failure: Secondary | ICD-10-CM
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
## 2022-08-21 NOTE — Patient Instructions (Signed)
Medication Instructions:  Your physician recommends that you continue on your current medications as directed. Please refer to the Current Medication list given to you today. *If you need a refill on your cardiac medications before your next appointment, please call your pharmacy*   Follow-Up: At [facility], you and your health needs are our priority.  As part of our continuing mission to provide you with exceptional heart care, we have created designated Provider Care Teams.  These Care Teams include your primary Cardiologist (physician) and Advanced Practice Providers (APPs -  Physician Assistants and Nurse Practitioners) who all work together to provide you with the care you need, when you need it.  We recommend signing up for the patient portal called "MyChart".  Sign up information is provided on this After Visit Summary.  MyChart is used to connect with patients for Virtual Visits (Telemedicine).  Patients are able to view lab/test results, encounter notes, upcoming appointments, etc.  Non-urgent messages can be sent to your provider as well.   To learn more about what you can do with MyChart, go to [email or web address].mychart.com.    Your next appointment:   6 month(s)  Provider:   You will see one of the following Advanced Practice Providers on your designated Care Team:   Renee Ursuy, PA-C Michael "Andy" Tillery, PA-C 

## 2022-08-25 ENCOUNTER — Ambulatory Visit (INDEPENDENT_AMBULATORY_CARE_PROVIDER_SITE_OTHER): Payer: Medicare Other

## 2022-08-25 DIAGNOSIS — I472 Ventricular tachycardia, unspecified: Secondary | ICD-10-CM

## 2022-08-25 LAB — CUP PACEART REMOTE DEVICE CHECK
Battery Remaining Longevity: 132 mo
Battery Remaining Percentage: 100 %
Brady Statistic RA Percent Paced: 35 %
Brady Statistic RV Percent Paced: 21 %
Date Time Interrogation Session: 20240405054600
HighPow Impedance: 55 Ohm
Implantable Lead Connection Status: 753985
Implantable Lead Connection Status: 753985
Implantable Lead Connection Status: 753985
Implantable Lead Implant Date: 20050922
Implantable Lead Implant Date: 20050922
Implantable Lead Implant Date: 20231228
Implantable Lead Location: 753858
Implantable Lead Location: 753859
Implantable Lead Location: 753860
Implantable Lead Model: 158
Implantable Lead Model: 4671
Implantable Lead Model: 5076
Implantable Lead Serial Number: 156891
Implantable Lead Serial Number: 878505
Implantable Pulse Generator Implant Date: 20231228
Lead Channel Impedance Value: 1026 Ohm
Lead Channel Impedance Value: 441 Ohm
Lead Channel Impedance Value: 454 Ohm
Lead Channel Pacing Threshold Amplitude: 0.4 V
Lead Channel Pacing Threshold Amplitude: 1.4 V
Lead Channel Pacing Threshold Amplitude: 2 V
Lead Channel Pacing Threshold Pulse Width: 0.4 ms
Lead Channel Pacing Threshold Pulse Width: 0.4 ms
Lead Channel Pacing Threshold Pulse Width: 0.4 ms
Lead Channel Setting Pacing Amplitude: 2 V
Lead Channel Setting Pacing Amplitude: 3 V
Lead Channel Setting Pacing Amplitude: 3.2 V
Lead Channel Setting Pacing Pulse Width: 0.4 ms
Lead Channel Setting Pacing Pulse Width: 0.4 ms
Lead Channel Setting Sensing Sensitivity: 0.6 mV
Lead Channel Setting Sensing Sensitivity: 1 mV
Pulse Gen Serial Number: 394296

## 2022-09-26 NOTE — Progress Notes (Signed)
Remote ICD transmission.   

## 2022-09-29 ENCOUNTER — Encounter (HOSPITAL_COMMUNITY): Payer: Self-pay

## 2022-09-29 ENCOUNTER — Ambulatory Visit (HOSPITAL_COMMUNITY)
Admission: RE | Admit: 2022-09-29 | Discharge: 2022-09-29 | Disposition: A | Discharge status: Home / Self Care | Payer: Medicare Other | Source: Ambulatory Visit | Attending: Internal Medicine | Admitting: Internal Medicine

## 2022-09-29 VITALS — BP 118/68 | HR 64 | Wt 174.0 lb

## 2022-09-29 DIAGNOSIS — N62 Hypertrophy of breast: Secondary | ICD-10-CM | POA: Diagnosis not present

## 2022-09-29 DIAGNOSIS — I472 Ventricular tachycardia, unspecified: Secondary | ICD-10-CM | POA: Diagnosis not present

## 2022-09-29 DIAGNOSIS — I2510 Atherosclerotic heart disease of native coronary artery without angina pectoris: Secondary | ICD-10-CM

## 2022-09-29 DIAGNOSIS — I340 Nonrheumatic mitral (valve) insufficiency: Secondary | ICD-10-CM | POA: Diagnosis not present

## 2022-09-29 DIAGNOSIS — Z7901 Long term (current) use of anticoagulants: Secondary | ICD-10-CM | POA: Diagnosis not present

## 2022-09-29 DIAGNOSIS — Z87891 Personal history of nicotine dependence: Secondary | ICD-10-CM | POA: Diagnosis not present

## 2022-09-29 DIAGNOSIS — Z955 Presence of coronary angioplasty implant and graft: Secondary | ICD-10-CM | POA: Diagnosis not present

## 2022-09-29 DIAGNOSIS — G4733 Obstructive sleep apnea (adult) (pediatric): Secondary | ICD-10-CM | POA: Insufficient documentation

## 2022-09-29 DIAGNOSIS — Z8673 Personal history of transient ischemic attack (TIA), and cerebral infarction without residual deficits: Secondary | ICD-10-CM | POA: Insufficient documentation

## 2022-09-29 DIAGNOSIS — I130 Hypertensive heart and chronic kidney disease with heart failure and stage 1 through stage 4 chronic kidney disease, or unspecified chronic kidney disease: Secondary | ICD-10-CM | POA: Diagnosis not present

## 2022-09-29 DIAGNOSIS — I252 Old myocardial infarction: Secondary | ICD-10-CM | POA: Insufficient documentation

## 2022-09-29 DIAGNOSIS — E1122 Type 2 diabetes mellitus with diabetic chronic kidney disease: Secondary | ICD-10-CM | POA: Diagnosis not present

## 2022-09-29 DIAGNOSIS — N1832 Chronic kidney disease, stage 3b: Secondary | ICD-10-CM | POA: Diagnosis not present

## 2022-09-29 DIAGNOSIS — J849 Interstitial pulmonary disease, unspecified: Secondary | ICD-10-CM | POA: Insufficient documentation

## 2022-09-29 DIAGNOSIS — E785 Hyperlipidemia, unspecified: Secondary | ICD-10-CM | POA: Diagnosis not present

## 2022-09-29 DIAGNOSIS — I5022 Chronic systolic (congestive) heart failure: Secondary | ICD-10-CM | POA: Diagnosis not present

## 2022-09-29 DIAGNOSIS — Z7902 Long term (current) use of antithrombotics/antiplatelets: Secondary | ICD-10-CM | POA: Insufficient documentation

## 2022-09-29 DIAGNOSIS — Z95810 Presence of automatic (implantable) cardiac defibrillator: Secondary | ICD-10-CM | POA: Diagnosis not present

## 2022-09-29 DIAGNOSIS — R251 Tremor, unspecified: Secondary | ICD-10-CM | POA: Diagnosis not present

## 2022-09-29 DIAGNOSIS — Z79899 Other long term (current) drug therapy: Secondary | ICD-10-CM | POA: Insufficient documentation

## 2022-09-29 DIAGNOSIS — I255 Ischemic cardiomyopathy: Secondary | ICD-10-CM | POA: Diagnosis not present

## 2022-09-29 DIAGNOSIS — I480 Paroxysmal atrial fibrillation: Secondary | ICD-10-CM | POA: Diagnosis not present

## 2022-09-29 LAB — BASIC METABOLIC PANEL
Anion gap: 8 (ref 5–15)
BUN: 24 mg/dL — ABNORMAL HIGH (ref 8–23)
CO2: 27 mmol/L (ref 22–32)
Calcium: 9.3 mg/dL (ref 8.9–10.3)
Chloride: 101 mmol/L (ref 98–111)
Creatinine, Ser: 1.69 mg/dL — ABNORMAL HIGH (ref 0.61–1.24)
GFR, Estimated: 42 mL/min — ABNORMAL LOW (ref >60)
Glucose, Bld: 177 mg/dL — ABNORMAL HIGH (ref 70–99)
Potassium: 4.2 mmol/L (ref 3.5–5.1)
Sodium: 136 mmol/L (ref 135–145)

## 2022-09-29 LAB — BRAIN NATRIURETIC PEPTIDE: B Natriuretic Peptide: 91.9 pg/mL (ref 0.0–100.0)

## 2022-09-29 MED ORDER — EMPAGLIFLOZIN 10 MG PO TABS: 10.0000 mg | ORAL_TABLET | Freq: Every day | ORAL | 10 refills | Status: DC

## 2022-09-29 NOTE — Patient Instructions (Signed)
Good to see you today!   RESTART Jardiance  10 mg daily   Labs done today, your results will be available in MyChart, we will contact you for abnormal readings.  Your physician has requested that you have an echocardiogram. Echocardiography is a painless test that uses sound waves to create images of your heart. It provides your doctor with information about the size and shape of your heart and how well your heart's chambers and valves are working. This procedure takes approximately one hour. There are no restrictions for this procedure. Please do NOT wear cologne, perfume, aftershave, or lotions (deodorant is allowed). Please arrive 15 minutes prior to your appointment time.  Your physician recommends that you schedule a follow-up appointment in: 4 months with echocardiogram   If you have any questions or concerns before your next appointment please send [place] a message through [place] or call our office at [phone].    TO LEAVE A MESSAGE FOR THE NURSE SELECT OPTION 2, PLEASE LEAVE A MESSAGE INCLUDING: YOUR NAME DATE OF BIRTH CALL BACK NUMBER REASON FOR CALL**this is important as we prioritize the call backs  YOU WILL RECEIVE A CALL BACK THE SAME DAY AS LONG AS YOU CALL BEFORE 4:00 PM  At the [facility], you and your health needs are our priority. As part of our continuing mission to provide you with exceptional heart care, we have created designated Provider Care Teams. These Care Teams include your primary Cardiologist (physician) and Advanced Practice Providers (APPs- Physician Assistants and Nurse Practitioners) who all work together to provide you with the care you need, when you need it.   You may see any of the following providers on your designated Care Team at your next follow up: Dr Arvilla Meres Dr Marca Ancona Dr. Marcos Eke, NP Robbie Lis, [place] [facility] [place], [place] Brynda Peon, NP Karle Plumber, PharmD   Please  be sure to bring in all your medications bottles to every appointment.    Thank you for choosing [facility]
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
## 2022-10-03 ENCOUNTER — Telehealth (HOSPITAL_COMMUNITY): Payer: Self-pay

## 2022-10-03 NOTE — Telephone Encounter (Signed)
They asked if ok for hm to drive. He has not been driving for about 9 months, but she wanted to know if ok around town only.

## 2022-10-04 NOTE — Telephone Encounter (Signed)
Spoke to patient and confirmed no ICD shocks. Updated him on status.

## 2022-10-17 ENCOUNTER — Ambulatory Visit: Payer: Medicare Other | Admitting: Neurology

## 2022-11-24 ENCOUNTER — Ambulatory Visit: Payer: Medicare Other

## 2022-11-24 DIAGNOSIS — I255 Ischemic cardiomyopathy: Secondary | ICD-10-CM | POA: Diagnosis not present

## 2022-11-27 LAB — CUP PACEART REMOTE DEVICE CHECK
Battery Remaining Percentage: 100 %
Brady Statistic RA Percent Paced: 56 %
Implantable Lead Connection Status: 753985
Implantable Lead Implant Date: 20050922
Implantable Lead Implant Date: 20050922
Implantable Lead Implant Date: 20231228
Implantable Lead Location: 753859
Implantable Lead Location: 753860
Implantable Lead Model: 158
Implantable Lead Model: 4671
Implantable Lead Serial Number: 156891
Lead Channel Impedance Value: 1051 Ohm
Lead Channel Impedance Value: 467 Ohm
Lead Channel Pacing Threshold Amplitude: 0.6 V
Lead Channel Pacing Threshold Pulse Width: 0.4 ms
Lead Channel Setting Pacing Amplitude: 2.7 V
Lead Channel Setting Pacing Amplitude: 2.8 V
Lead Channel Setting Pacing Pulse Width: 0.4 ms
Lead Channel Setting Sensing Sensitivity: 1 mV

## 2022-11-28 LAB — CUP PACEART REMOTE DEVICE CHECK
Battery Remaining Longevity: 132 mo
Brady Statistic RV Percent Paced: 12 %
Date Time Interrogation Session: 20240708153300
HighPow Impedance: 62 Ohm
Implantable Lead Connection Status: 753985
Implantable Lead Connection Status: 753985
Implantable Lead Location: 753858
Implantable Lead Model: 5076
Implantable Lead Serial Number: 878505
Implantable Pulse Generator Implant Date: 20231228
Lead Channel Impedance Value: 445 Ohm
Lead Channel Pacing Threshold Amplitude: 1.4 V
Lead Channel Pacing Threshold Pulse Width: 0.4 ms
Lead Channel Setting Pacing Amplitude: 2 V
Lead Channel Setting Pacing Pulse Width: 0.4 ms
Lead Channel Setting Sensing Sensitivity: 0.6 mV
Pulse Gen Serial Number: 394296

## 2022-12-13 NOTE — Progress Notes (Signed)
Remote ICD transmission.   
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
## 2023-01-30 ENCOUNTER — Ambulatory Visit (HOSPITAL_COMMUNITY)
Admission: RE | Admit: 2023-01-30 | Discharge: 2023-01-30 | Disposition: A | Discharge status: Home / Self Care | Payer: Medicare Other | Source: Ambulatory Visit | Attending: Internal Medicine | Admitting: Internal Medicine

## 2023-01-30 ENCOUNTER — Encounter: Payer: Self-pay | Admitting: Physician Assistant

## 2023-01-30 ENCOUNTER — Encounter (HOSPITAL_COMMUNITY): Payer: Self-pay | Admitting: Internal Medicine

## 2023-01-30 ENCOUNTER — Ambulatory Visit (HOSPITAL_BASED_OUTPATIENT_CLINIC_OR_DEPARTMENT_OTHER)
Admission: RE | Admit: 2023-01-30 | Discharge: 2023-01-30 | Disposition: A | Discharge status: Home / Self Care | Payer: Medicare Other | Source: Ambulatory Visit | Attending: Internal Medicine | Admitting: Internal Medicine

## 2023-01-30 ENCOUNTER — Ambulatory Visit (INDEPENDENT_AMBULATORY_CARE_PROVIDER_SITE_OTHER): Payer: Medicare Other | Admitting: Physician Assistant

## 2023-01-30 VITALS — BP 142/78 | HR 65 | Ht 69.0 in | Wt 175.2 lb

## 2023-01-30 VITALS — BP 152/76 | HR 66 | Ht 69.0 in | Wt 175.0 lb

## 2023-01-30 DIAGNOSIS — I472 Ventricular tachycardia, unspecified: Secondary | ICD-10-CM

## 2023-01-30 DIAGNOSIS — I340 Nonrheumatic mitral (valve) insufficiency: Secondary | ICD-10-CM

## 2023-01-30 DIAGNOSIS — I3139 Other pericardial effusion (noninflammatory): Secondary | ICD-10-CM | POA: Diagnosis not present

## 2023-01-30 DIAGNOSIS — I130 Hypertensive heart and chronic kidney disease with heart failure and stage 1 through stage 4 chronic kidney disease, or unspecified chronic kidney disease: Secondary | ICD-10-CM | POA: Diagnosis not present

## 2023-01-30 DIAGNOSIS — E1122 Type 2 diabetes mellitus with diabetic chronic kidney disease: Secondary | ICD-10-CM | POA: Insufficient documentation

## 2023-01-30 DIAGNOSIS — D6869 Other thrombophilia: Secondary | ICD-10-CM | POA: Insufficient documentation

## 2023-01-30 DIAGNOSIS — I255 Ischemic cardiomyopathy: Secondary | ICD-10-CM | POA: Insufficient documentation

## 2023-01-30 DIAGNOSIS — J449 Chronic obstructive pulmonary disease, unspecified: Secondary | ICD-10-CM | POA: Insufficient documentation

## 2023-01-30 DIAGNOSIS — Z95810 Presence of automatic (implantable) cardiac defibrillator: Secondary | ICD-10-CM

## 2023-01-30 DIAGNOSIS — E785 Hyperlipidemia, unspecified: Secondary | ICD-10-CM | POA: Diagnosis not present

## 2023-01-30 DIAGNOSIS — G4730 Sleep apnea, unspecified: Secondary | ICD-10-CM | POA: Diagnosis not present

## 2023-01-30 DIAGNOSIS — I5022 Chronic systolic (congestive) heart failure: Secondary | ICD-10-CM | POA: Diagnosis not present

## 2023-01-30 DIAGNOSIS — I2510 Atherosclerotic heart disease of native coronary artery without angina pectoris: Secondary | ICD-10-CM | POA: Diagnosis not present

## 2023-01-30 DIAGNOSIS — I480 Paroxysmal atrial fibrillation: Secondary | ICD-10-CM

## 2023-01-30 DIAGNOSIS — Z7901 Long term (current) use of anticoagulants: Secondary | ICD-10-CM | POA: Insufficient documentation

## 2023-01-30 DIAGNOSIS — N1832 Chronic kidney disease, stage 3b: Secondary | ICD-10-CM | POA: Insufficient documentation

## 2023-01-30 LAB — BASIC METABOLIC PANEL
Anion gap: 7 (ref 5–15)
BUN: 19 mg/dL (ref 8–23)
CO2: 28 mmol/L (ref 22–32)
Calcium: 9.5 mg/dL (ref 8.9–10.3)
Chloride: 103 mmol/L (ref 98–111)
Creatinine, Ser: 1.37 mg/dL — ABNORMAL HIGH (ref 0.61–1.24)
GFR, Estimated: 53 mL/min — ABNORMAL LOW (ref >60)
Glucose, Bld: 117 mg/dL — ABNORMAL HIGH (ref 70–99)
Potassium: 4.2 mmol/L (ref 3.5–5.1)
Sodium: 138 mmol/L (ref 135–145)

## 2023-01-30 LAB — CUP PACEART INCLINIC DEVICE CHECK
Date Time Interrogation Session: 20240910132145
Implantable Lead Connection Status: 753985
Implantable Lead Connection Status: 753985
Implantable Lead Connection Status: 753985
Implantable Lead Implant Date: 20050922
Implantable Lead Implant Date: 20050922
Implantable Lead Implant Date: 20231228
Implantable Lead Location: 753858
Implantable Lead Location: 753859
Implantable Lead Location: 753860
Implantable Lead Model: 158
Implantable Lead Model: 4671
Implantable Lead Model: 5076
Implantable Lead Serial Number: 156891
Implantable Lead Serial Number: 878505
Implantable Pulse Generator Implant Date: 20231228
Lead Channel Pacing Threshold Amplitude: 0.4 V
Lead Channel Pacing Threshold Amplitude: 0.6 V
Lead Channel Pacing Threshold Amplitude: 1.2 V
Lead Channel Pacing Threshold Pulse Width: 0.4 ms
Lead Channel Pacing Threshold Pulse Width: 0.4 ms
Lead Channel Pacing Threshold Pulse Width: 0.4 ms
Lead Channel Sensing Intrinsic Amplitude: 22.9 mV
Lead Channel Sensing Intrinsic Amplitude: 3 mV
Lead Channel Sensing Intrinsic Amplitude: 5.9 mV
Pulse Gen Serial Number: 394296

## 2023-01-30 LAB — ECHOCARDIOGRAM COMPLETE
Area-P 1/2: 3.4 cm2
Calc EF: 26.8 %
MV M vel: 4.14 m/s
MV Peak grad: 68.6 mmHg
Radius: 0.5 cm
S' Lateral: 5.4 cm
Single Plane A2C EF: 28.5 %
Single Plane A4C EF: 25.9 %

## 2023-01-30 LAB — BRAIN NATRIURETIC PEPTIDE: B Natriuretic Peptide: 153.7 pg/mL — ABNORMAL HIGH (ref 0.0–100.0)

## 2023-01-30 MED ORDER — ENTRESTO 49-51 MG PO TABS: 1.0000 | ORAL_TABLET | Freq: Two times a day (BID) | ORAL | 3 refills | Status: DC

## 2023-01-30 NOTE — Patient Instructions (Signed)
Medication Changes:  Increase 49/51 mg Twice daily   Lab Work:  Labs done today, your results will be available in MyChart, we will contact you for abnormal readings.  Special Instructions // Education:  Do the following things EVERYDAY: Weigh yourself in the morning before breakfast. Write it down and keep it in a log. Take your medicines as prescribed Eat low salt foods--Limit salt (sodium) to 2000 mg per day.  Stay as active as you can everyday Limit all fluids for the day to less than 2 liters   Follow-Up in: 6 months (March 2024), **PLEASE CALL OUR OFFICE IN [place] TO SCHEDULE THIS APPOINTMENT   At the [facility], you and your health needs are our priority. We have a designated team specialized in the treatment of Heart Failure. This Care Team includes your primary Heart Failure Specialized Cardiologist (physician), Advanced Practice Providers (APPs- Physician Assistants and Nurse Practitioners), and Pharmacist who all work together to provide you with the care you need, when you need it.   You may see any of the following providers on your designated Care Team at your next follow up:  Dr. Arvilla Meres Dr. Marca Ancona Dr. Marcos Eke, NP Robbie Lis, [place] [facility] [place], [place] Brynda Peon, NP Karle Plumber, PharmD   Please be sure to bring in all your medications bottles to every appointment.   Need to Contact us:  If you have any questions or concerns before your next appointment please send [place] a message through [place] or call our office at [phone].    TO LEAVE A MESSAGE FOR THE NURSE SELECT OPTION 2, PLEASE LEAVE A MESSAGE INCLUDING: YOUR NAME DATE OF BIRTH CALL BACK NUMBER REASON FOR CALL**this is important as we prioritize the call backs  YOU WILL RECEIVE A CALL BACK THE SAME DAY AS LONG AS YOU CALL BEFORE 4:00 PM

## 2023-01-30 NOTE — Patient Instructions (Signed)
Medication Instructions:   Your physician recommends that you continue on your current medications as directed. Please refer to the Current Medication list given to you today.   *If you need a refill on your cardiac medications before your next appointment, please call your pharmacy*   Lab Work: NONE ORDERED  TODAY    If you have labs (blood work) drawn today and your tests are completely normal, you will receive your results only by: MyChart Message (if you have MyChart) OR A paper copy in the mail If you have any lab test that is abnormal or we need to change your treatment, we will call you to review the results.   Testing/Procedures: NONE ORDERED  TODAY     Follow-Up: At [facility], you and your health needs are our priority.  As part of our continuing mission to provide you with exceptional heart care, we have created designated Provider Care Teams.  These Care Teams include your primary Cardiologist (physician) and Advanced Practice Providers (APPs -  Physician Assistants and Nurse Practitioners) who all work together to provide you with the care you need, when you need it.  We recommend signing up for the patient portal called "MyChart".  Sign up information is provided on this After Visit Summary.  MyChart is used to connect with patients for Virtual Visits (Telemedicine).  Patients are able to view lab/test results, encounter notes, upcoming appointments, etc.  Non-urgent messages can be sent to your provider as well.   To learn more about what you can do with MyChart, go to [email or web address].    Your next appointment:   6 month(s)  Provider:   You may see Maurice Small, MD or one of the following Advanced Practice Providers on your designated Care Team:   Francis Dowse, [place]   Other Instructions

## 2023-02-23 ENCOUNTER — Ambulatory Visit (INDEPENDENT_AMBULATORY_CARE_PROVIDER_SITE_OTHER): Payer: Medicare Other

## 2023-02-23 DIAGNOSIS — I472 Ventricular tachycardia, unspecified: Secondary | ICD-10-CM | POA: Diagnosis not present

## 2023-02-24 LAB — CUP PACEART REMOTE DEVICE CHECK
Battery Remaining Longevity: 132 mo
Battery Remaining Percentage: 100 %
Brady Statistic RA Percent Paced: 48 %
Brady Statistic RV Percent Paced: 10 %
Date Time Interrogation Session: 20241004031100
HighPow Impedance: 68 Ohm
Implantable Lead Connection Status: 753985
Implantable Lead Connection Status: 753985
Implantable Lead Connection Status: 753985
Implantable Lead Implant Date: 20050922
Implantable Lead Implant Date: 20050922
Implantable Lead Implant Date: 20231228
Implantable Lead Location: 753858
Implantable Lead Location: 753859
Implantable Lead Location: 753860
Implantable Lead Model: 158
Implantable Lead Model: 4671
Implantable Lead Model: 5076
Implantable Lead Serial Number: 156891
Implantable Lead Serial Number: 878505
Implantable Pulse Generator Implant Date: 20231228
Lead Channel Impedance Value: 1026 Ohm
Lead Channel Impedance Value: 435 Ohm
Lead Channel Impedance Value: 478 Ohm
Lead Channel Pacing Threshold Amplitude: 0.6 V
Lead Channel Pacing Threshold Amplitude: 1.3 V
Lead Channel Pacing Threshold Amplitude: 1.4 V
Lead Channel Pacing Threshold Pulse Width: 0.4 ms
Lead Channel Pacing Threshold Pulse Width: 0.4 ms
Lead Channel Pacing Threshold Pulse Width: 0.4 ms
Lead Channel Setting Pacing Amplitude: 2 V
Lead Channel Setting Pacing Amplitude: 2.4 V
Lead Channel Setting Pacing Amplitude: 2.6 V
Lead Channel Setting Pacing Pulse Width: 0.4 ms
Lead Channel Setting Pacing Pulse Width: 0.4 ms
Lead Channel Setting Sensing Sensitivity: 0.6 mV
Lead Channel Setting Sensing Sensitivity: 1 mV
Pulse Gen Serial Number: 394296

## 2023-03-02 ENCOUNTER — Other Ambulatory Visit (HOSPITAL_COMMUNITY): Payer: Self-pay | Admitting: Cardiology

## 2023-03-02 MED ORDER — RANOLAZINE ER 500 MG PO TB12: 1000.0000 mg | ORAL_TABLET | Freq: Two times a day (BID) | ORAL | 2 refills | Status: DC

## 2023-03-07 NOTE — Progress Notes (Signed)
Remote ICD transmission.   

## 2023-04-12 ENCOUNTER — Ambulatory Visit: Payer: Medicare Other | Admitting: Neurology

## 2023-05-11 ENCOUNTER — Other Ambulatory Visit: Payer: Self-pay | Admitting: Neurology

## 2023-05-25 ENCOUNTER — Ambulatory Visit (INDEPENDENT_AMBULATORY_CARE_PROVIDER_SITE_OTHER): Payer: Medicare Other

## 2023-05-25 DIAGNOSIS — I255 Ischemic cardiomyopathy: Secondary | ICD-10-CM

## 2023-05-26 ENCOUNTER — Other Ambulatory Visit (HOSPITAL_COMMUNITY): Payer: Self-pay | Admitting: Internal Medicine

## 2023-05-28 LAB — CUP PACEART REMOTE DEVICE CHECK
Battery Remaining Longevity: 132 mo
Battery Remaining Percentage: 100 %
Brady Statistic RA Percent Paced: 52 %
Brady Statistic RV Percent Paced: 12 %
Date Time Interrogation Session: 20250103041700
HighPow Impedance: 64 Ohm
Implantable Lead Connection Status: 753985
Implantable Lead Connection Status: 753985
Implantable Lead Connection Status: 753985
Implantable Lead Implant Date: 20050922
Implantable Lead Implant Date: 20050922
Implantable Lead Implant Date: 20231228
Implantable Lead Location: 753858
Implantable Lead Location: 753859
Implantable Lead Location: 753860
Implantable Lead Model: 158
Implantable Lead Model: 4671
Implantable Lead Model: 5076
Implantable Lead Serial Number: 156891
Implantable Lead Serial Number: 878505
Implantable Pulse Generator Implant Date: 20231228
Lead Channel Impedance Value: 427 Ohm
Lead Channel Impedance Value: 462 Ohm
Lead Channel Impedance Value: 924 Ohm
Lead Channel Pacing Threshold Amplitude: 0.6 V
Lead Channel Pacing Threshold Amplitude: 1.4 V
Lead Channel Pacing Threshold Amplitude: 1.5 V
Lead Channel Pacing Threshold Pulse Width: 0.4 ms
Lead Channel Pacing Threshold Pulse Width: 0.4 ms
Lead Channel Pacing Threshold Pulse Width: 0.4 ms
Lead Channel Setting Pacing Amplitude: 2 V
Lead Channel Setting Pacing Amplitude: 2.5 V
Lead Channel Setting Pacing Amplitude: 2.8 V
Lead Channel Setting Pacing Pulse Width: 0.4 ms
Lead Channel Setting Pacing Pulse Width: 0.4 ms
Lead Channel Setting Sensing Sensitivity: 0.6 mV
Lead Channel Setting Sensing Sensitivity: 1 mV
Pulse Gen Serial Number: 394296

## 2023-06-11 NOTE — Telephone Encounter (Signed)
 error

## 2023-06-27 ENCOUNTER — Other Ambulatory Visit (HOSPITAL_COMMUNITY): Payer: Self-pay | Admitting: Internal Medicine

## 2023-07-03 NOTE — Addendum Note (Signed)
Addended by: Elease Etienne A on: 07/03/2023 10:41 AM   Modules accepted: Orders

## 2023-07-03 NOTE — Progress Notes (Signed)
Remote ICD transmission.

## 2023-07-22 ENCOUNTER — Other Ambulatory Visit (HOSPITAL_COMMUNITY): Payer: Self-pay | Admitting: Internal Medicine

## 2023-07-30 ENCOUNTER — Ambulatory Visit: Payer: Medicare Other | Attending: Cardiovascular Disease | Admitting: Cardiovascular Disease

## 2023-07-30 ENCOUNTER — Encounter: Payer: Self-pay | Admitting: Cardiovascular Disease

## 2023-07-30 VITALS — BP 140/60 | HR 62 | Ht 69.0 in | Wt 172.0 lb

## 2023-07-30 DIAGNOSIS — I472 Ventricular tachycardia, unspecified: Secondary | ICD-10-CM

## 2023-07-30 DIAGNOSIS — Z95810 Presence of automatic (implantable) cardiac defibrillator: Secondary | ICD-10-CM | POA: Diagnosis not present

## 2023-07-30 DIAGNOSIS — I5022 Chronic systolic (congestive) heart failure: Secondary | ICD-10-CM | POA: Diagnosis not present

## 2023-07-30 DIAGNOSIS — I480 Paroxysmal atrial fibrillation: Secondary | ICD-10-CM

## 2023-07-30 DIAGNOSIS — I255 Ischemic cardiomyopathy: Secondary | ICD-10-CM | POA: Diagnosis not present

## 2023-07-30 NOTE — Patient Instructions (Signed)
 Medication Instructions:  Your physician recommends that you continue on your current medications as directed. Please refer to the Current Medication list given to you today. *If you need a refill on your cardiac medications before your next appointment, please call your pharmacy*   Follow-Up: At [facility], you and your health needs are our priority.  As part of our continuing mission to provide you with exceptional heart care, we have created designated Provider Care Teams.  These Care Teams include your primary Cardiologist (physician) and Advanced Practice Providers (APPs -  Physician Assistants and Nurse Practitioners) who all work together to provide you with the care you need, when you need it.  We recommend signing up for the patient portal called "MyChart".  Sign up information is provided on this After Visit Summary.  MyChart is used to connect with patients for Virtual Visits (Telemedicine).  Patients are able to view lab/test results, encounter notes, upcoming appointments, etc.  Non-urgent messages can be sent to your provider as well.   To learn more about what you can do with MyChart, go to [email or web address].    Your next appointment:   6 month(s)  Provider:   York Pellant, MD

## 2023-07-30 NOTE — Progress Notes (Signed)
 Electrophysiology Office Note:    Date:  07/30/2023   ID:  [name], DOB 05-24-1945, MRN [id number]  PCP:  System, Provider Not In   [facility] Providers Cardiologist:  Arvilla Meres, MD Electrophysiologist:  Maurice Small, MD     Referring MD: No ref. provider found   History of Present Illness:    [name] is a 78 y.o. male with a hx listed below, significant for atrial fibrillation, VT, CHF bradycardia with Boston scientific BiV ICD, referred for arrhythmia management.  He has a history of VT.  He was managed with sotalol in the past, but he had episodes of VT and was therefore switched to amiodarone.  He has had 2 ablations for VT.  He was living in [place].  In December he had an episode of syncope and was admitted to [facility] where he underwent upgrade to a CRT D system after an episode of syncope due to to VT.  He reports that he did not receive a shock from his device.  He was presyncopal, so his wife called EMS.  He passed out as they were loading him onto the ambulance, and he was converted to sinus with a shock.    he has no device related complaints -- no new tenderness, drainage, redness.    EKGs/Labs/Other Studies Reviewed Today:    Echocardiogram:  TTE 02/17/22  1. Left ventricular ejection fraction, by estimation, is 25 to 30%. The  left ventricle has severely decreased function. The left ventricle  demonstrates global hypokinesis. The left ventricular internal cavity size  was moderately dilated. Left  ventricular diastolic parameters are consistent with Grade II diastolic  dysfunction (pseudonormalization).   2. Right ventricular systolic function is normal. The right ventricular  size is normal. There is normal pulmonary artery systolic pressure.   3. Left atrial size was moderately dilated.   4. The mitral valve is normal in structure. Mild to moderate mitral valve  regurgitation. No evidence  of mitral stenosis.   5. The aortic valve is tricuspid. There is mild calcification of the  aortic valve. Aortic valve regurgitation is not visualized. Aortic valve  sclerosis/calcification is present, without any evidence of aortic  stenosis.   6. The inferior vena cava is normal in size with greater than 50%  respiratory variability, suggesting right atrial pressure of 3 mmHg.    Monitors:   Stress testing:   Advanced imaging:   EKG:   EKG Interpretation Date/Time:  Monday July 30 2023 12:07:54 EDT Ventricular Rate:  62 PR Interval:  202 QRS Duration:  114 QT Interval:  428 QTC Calculation: 434 R Axis:   85  Text Interpretation: AV dual-paced rhythm with occasional ventricular-paced complexes and with occasional Premature ventricular complexes When compared with ECG of 17-Feb-2022 09:32, Electronic ventricular pacemaker has replaced Electronic atrial pacemaker Confirmed by York Pellant [phone]) on 07/30/2023 12:43:35 PM    Recent Labs: 01/30/2023: B Natriuretic Peptide 153.7; BUN 19; Creatinine, Ser 1.37; Potassium 4.2; Sodium 138     Physical Exam:    VS:  BP (!) 140/60 (BP Location: Right Arm, Patient Position: Sitting, Cuff Size: Normal)   Pulse 62   Ht 5\' 9"  (1.753 m)   Wt 172 lb (78 kg)   SpO2 96%   BMI 25.40 kg/m     Wt Readings from Last 3 Encounters:  07/30/23 172 lb (78 kg)  01/30/23 175 lb 3.2 oz (79.5 kg)  01/30/23 175 lb (79.4 kg)  GEN: Well nourished, well developed in no acute distress CARDIAC: RRR, no murmurs, rubs, gallops RESPIRATORY:  Normal work of breathing MUSCULOSKELETAL: no edema    ASSESSMENT & PLAN:    VT Boston Scientific BiV ICD programmed for secondary prevention Continue amiodarone   Atrial high rate episodes -- possible sinus tachycardia with exertion Rates about 130-134 bpm. In his VT 1 zone We increased the VT 1 zone to > 135 bpm  Ischemic cardiomyopathy Euvolemic today  [employer] scientific BiV ICD Normal  device function -- see pace art for details of the interrogation today  Atrial fibrillation Well-controlled Continue Eliquis for CHA2DS2-VASc score of 4       Medication Adjustments/Labs and Tests Ordered: Current medicines are reviewed at length with the patient today.  Concerns regarding medicines are outlined above.  Orders Placed This Encounter  Procedures   EKG 12-Lead   No orders of the defined types were placed in this encounter.    Signed, Maurice Small, MD  07/30/2023 12:42 PM    [facility]

## 2023-08-07 ENCOUNTER — Other Ambulatory Visit (HOSPITAL_COMMUNITY): Payer: Self-pay | Admitting: Cardiology

## 2023-08-07 MED ORDER — RANOLAZINE ER 500 MG PO TB12: 1000.0000 mg | ORAL_TABLET | Freq: Two times a day (BID) | ORAL | 6 refills | Status: DC

## 2023-08-19 ENCOUNTER — Other Ambulatory Visit (HOSPITAL_COMMUNITY): Payer: Self-pay | Admitting: Internal Medicine

## 2023-08-24 ENCOUNTER — Ambulatory Visit (INDEPENDENT_AMBULATORY_CARE_PROVIDER_SITE_OTHER): Payer: Medicare Other

## 2023-08-24 DIAGNOSIS — I255 Ischemic cardiomyopathy: Secondary | ICD-10-CM | POA: Diagnosis not present

## 2023-08-24 LAB — CUP PACEART REMOTE DEVICE CHECK
Battery Remaining Longevity: 126 mo
Battery Remaining Percentage: 100 %
Brady Statistic RA Percent Paced: 50 %
Brady Statistic RV Percent Paced: 23 %
Date Time Interrogation Session: 20250404031200
HighPow Impedance: 67 Ohm
Implantable Lead Connection Status: 753985
Implantable Lead Connection Status: 753985
Implantable Lead Connection Status: 753985
Implantable Lead Implant Date: 20050922
Implantable Lead Implant Date: 20050922
Implantable Lead Implant Date: 20231228
Implantable Lead Location: 753858
Implantable Lead Location: 753859
Implantable Lead Location: 753860
Implantable Lead Model: 158
Implantable Lead Model: 4671
Implantable Lead Model: 5076
Implantable Lead Serial Number: 156891
Implantable Lead Serial Number: 878505
Implantable Pulse Generator Implant Date: 20231228
Lead Channel Impedance Value: 432 Ohm
Lead Channel Impedance Value: 474 Ohm
Lead Channel Impedance Value: 895 Ohm
Lead Channel Pacing Threshold Amplitude: 0.6 V
Lead Channel Pacing Threshold Amplitude: 1.3 V
Lead Channel Pacing Threshold Amplitude: 1.5 V
Lead Channel Pacing Threshold Pulse Width: 0.4 ms
Lead Channel Pacing Threshold Pulse Width: 0.4 ms
Lead Channel Pacing Threshold Pulse Width: 0.4 ms
Lead Channel Setting Pacing Amplitude: 2 V
Lead Channel Setting Pacing Amplitude: 2.5 V
Lead Channel Setting Pacing Amplitude: 2.6 V
Lead Channel Setting Pacing Pulse Width: 0.4 ms
Lead Channel Setting Pacing Pulse Width: 0.4 ms
Lead Channel Setting Sensing Sensitivity: 0.5 mV
Lead Channel Setting Sensing Sensitivity: 1 mV
Pulse Gen Serial Number: 394296

## 2023-09-02 ENCOUNTER — Encounter: Payer: Self-pay | Admitting: Cardiovascular Disease

## 2023-09-08 ENCOUNTER — Other Ambulatory Visit (HOSPITAL_COMMUNITY): Payer: Self-pay | Admitting: Internal Medicine

## 2023-09-19 ENCOUNTER — Other Ambulatory Visit (HOSPITAL_COMMUNITY): Payer: Self-pay | Admitting: Cardiology

## 2023-09-19 MED ORDER — RANOLAZINE ER 1000 MG PO TB12: 1000.0000 mg | ORAL_TABLET | Freq: Two times a day (BID) | ORAL | 6 refills | Status: DC

## 2023-10-01 NOTE — Progress Notes (Signed)
 Remote ICD transmission.

## 2023-10-02 ENCOUNTER — Other Ambulatory Visit (HOSPITAL_COMMUNITY): Payer: Self-pay | Admitting: Internal Medicine

## 2023-10-04 ENCOUNTER — Ambulatory Visit: Payer: Medicare Other | Admitting: Neurology

## 2023-10-04 ENCOUNTER — Encounter: Payer: Self-pay | Admitting: Neurology

## 2023-10-04 ENCOUNTER — Ambulatory Visit (INDEPENDENT_AMBULATORY_CARE_PROVIDER_SITE_OTHER): Admitting: Neurology

## 2023-10-04 VITALS — BP 135/74 | HR 77 | Ht 69.0 in | Wt 179.2 lb

## 2023-10-04 DIAGNOSIS — G40209 Localization-related (focal) (partial) symptomatic epilepsy and epileptic syndromes with complex partial seizures, not intractable, without status epilepticus: Secondary | ICD-10-CM

## 2023-10-04 DIAGNOSIS — F0153 Vascular dementia, unspecified severity, with mood disturbance: Secondary | ICD-10-CM | POA: Diagnosis not present

## 2023-10-04 DIAGNOSIS — F01A3 Vascular dementia, mild, with mood disturbance: Secondary | ICD-10-CM | POA: Diagnosis not present

## 2023-10-04 MED ORDER — RANOLAZINE ER 500 MG PO TB12: 1000.0000 mg | ORAL_TABLET | Freq: Two times a day (BID) | ORAL | 11 refills | Status: AC

## 2023-10-04 MED ORDER — NITROGLYCERIN 0.4 MG SL SUBL: 0.4000 mg | SUBLINGUAL_TABLET | SUBLINGUAL | 3 refills | Status: AC | PRN

## 2023-10-04 NOTE — Addendum Note (Signed)
 Addended by: Edris Gowers on: 10/04/2023 08:41 AM   Modules accepted: Orders

## 2023-10-04 NOTE — Patient Instructions (Signed)
 Good to see you again.  Schedule MRI brain without contrast  2. Schedule 1-hour EEG  3. Schedule repeat Neuropsychological testing  4. Continue Vimpat  100mg  twice a day and Memantine  10mg  twice a day  5. Follow-up after tests, call for any changes
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
## 2023-10-10 ENCOUNTER — Ambulatory Visit: Payer: Self-pay | Admitting: Neurology

## 2023-10-10 NOTE — Procedures (Signed)
 ELECTROENCEPHALOGRAM REPORT  Date of Study: 10/04/2023  Patient's Name: [name] MRN: [id number] Date of Birth: Mar 23, 1946  Referring Provider: Dr. Rayfield Cairo  Clinical History: This is a 78 year old man with a history of dementia and seizures with worsening memory. EEG to assess for subclinical seizures  CNS Active Medications: Vimpat ,Memantine , Sertraline   Technical Summary: A multichannel digital 1-hour EEG recording measured by the international 10-20 system with electrodes applied with paste and impedances below 5000 ohms performed in our laboratory with EKG monitoring in an awake and asleep patient.  Hyperventilation was not performed. Photic stimulation was performed.  The digital EEG was referentially recorded, reformatted, and digitally filtered in a variety of bipolar and referential montages for optimal display.    Description: The patient is awake and asleep during the recording.  During maximal wakefulness, there is a symmetric, medium voltage 9 Hz posterior dominant rhythm that attenuates with eye opening.  The record is symmetric.  During drowsiness and sleep, there is an increase in theta slowing of the background.  Vertex waves and symmetric sleep spindles were seen. Photic stimulation did not elicit any abnormalities.  There were no epileptiform discharges or electrographic seizures seen.    EKG lead showed occasional extrasystolic beats.  Impression: This 1-hour awake and asleep EEG is normal.    Clinical Correlation: A normal EEG does not exclude a clinical diagnosis of epilepsy.  If further clinical questions remain, prolonged EEG may be helpful.  Clinical correlation is advised.   Rayfield Cairo, M.D.

## 2023-10-11 NOTE — Telephone Encounter (Signed)
-----   Message from [name] sent at 10/10/2023 11:28 PM EDT ----- Pls let wife know EEG is normal. Proceed with other tests as discussed, thanks

## 2023-10-11 NOTE — Telephone Encounter (Signed)
 Spoke with pt wife informed her that EEG is normal. Proceed with other tests as discussed
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
## 2023-10-26 ENCOUNTER — Telehealth: Payer: Self-pay | Admitting: Neurology

## 2023-10-26 NOTE — Telephone Encounter (Signed)
 I think that is fine. He is also on the Eliquis . Ok for aspirin  with Eliquis . thanks

## 2023-10-26 NOTE — Telephone Encounter (Signed)
 Pt's wife called in stating his cardiologist Dr. Georgeanne King wants to take him off of plavix and put him on Asprin, but didn't want to do anything without checking with Dr. Ty Gales first. Dr. Tama Fails fax is [phone].

## 2023-10-26 NOTE — Telephone Encounter (Signed)
 Pt wife called informed Dr Ty Gales stated she thinks that is fine. He is also on the Eliquis . Ok for aspirin  with Eliquis 

## 2023-10-31 ENCOUNTER — Other Ambulatory Visit (HOSPITAL_COMMUNITY): Payer: Self-pay | Admitting: Internal Medicine
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
## 2023-11-05 ENCOUNTER — Encounter (HOSPITAL_COMMUNITY)

## 2023-11-27 ENCOUNTER — Other Ambulatory Visit (HOSPITAL_COMMUNITY): Payer: Self-pay | Admitting: Internal Medicine

## 2023-11-30 ENCOUNTER — Ambulatory Visit: Payer: Self-pay

## 2023-11-30 ENCOUNTER — Institutional Professional Consult (permissible substitution): Admitting: Psychology

## 2023-12-07 ENCOUNTER — Encounter: Admitting: Psychology

## 2023-12-10 ENCOUNTER — Encounter (HOSPITAL_COMMUNITY)

## 2023-12-15 ENCOUNTER — Other Ambulatory Visit (HOSPITAL_COMMUNITY): Payer: Self-pay | Admitting: Internal Medicine

## 2023-12-17 ENCOUNTER — Telehealth (HOSPITAL_COMMUNITY): Payer: Self-pay

## 2023-12-17 NOTE — Telephone Encounter (Signed)
 Received a fax requesting medical records from [facility], for [facility] shield of [place] . Records were successfully placed up front to be mailed to: [address] [place], [place]  [address] ,which was the number provided.. Medical request form will be scanned into patients chart.

## 2023-12-25 ENCOUNTER — Other Ambulatory Visit (HOSPITAL_COMMUNITY): Payer: Self-pay | Admitting: Internal Medicine

## 2024-01-13 ENCOUNTER — Other Ambulatory Visit (HOSPITAL_COMMUNITY): Payer: Self-pay | Admitting: Internal Medicine

## 2024-01-22 ENCOUNTER — Institutional Professional Consult (permissible substitution): Admitting: Psychology

## 2024-01-22 ENCOUNTER — Ambulatory Visit: Payer: Self-pay

## 2024-01-24 ENCOUNTER — Encounter: Payer: Self-pay | Admitting: Cardiovascular Disease

## 2024-01-24 ENCOUNTER — Other Ambulatory Visit (HOSPITAL_COMMUNITY): Payer: Self-pay | Admitting: Internal Medicine

## 2024-02-01 ENCOUNTER — Other Ambulatory Visit (HOSPITAL_COMMUNITY): Payer: Self-pay

## 2024-02-04 ENCOUNTER — Ambulatory Visit: Admitting: Neurology

## 2024-02-05 ENCOUNTER — Other Ambulatory Visit (HOSPITAL_COMMUNITY): Payer: Self-pay

## 2024-02-11 ENCOUNTER — Encounter: Admitting: Psychology

## 2024-02-12 ENCOUNTER — Encounter (HOSPITAL_COMMUNITY)

## 2024-02-29 ENCOUNTER — Ambulatory Visit: Admitting: Neurology

## 2024-03-16 ENCOUNTER — Other Ambulatory Visit (HOSPITAL_COMMUNITY): Payer: Self-pay | Admitting: Internal Medicine

## 2024-03-24 ENCOUNTER — Ambulatory Visit: Payer: Self-pay

## 2024-03-24 ENCOUNTER — Institutional Professional Consult (permissible substitution): Admitting: Psychology

## 2024-03-28 ENCOUNTER — Encounter (HOSPITAL_COMMUNITY)

## 2024-03-31 ENCOUNTER — Encounter: Admitting: Psychology

## 2024-04-08 ENCOUNTER — Encounter: Payer: Self-pay | Admitting: Neurology

## 2024-04-10 ENCOUNTER — Institutional Professional Consult (permissible substitution): Admitting: Psychology

## 2024-04-10 ENCOUNTER — Ambulatory Visit: Payer: Self-pay

## 2024-04-20 ENCOUNTER — Other Ambulatory Visit (HOSPITAL_COMMUNITY): Payer: Self-pay | Admitting: Internal Medicine

## 2024-04-21 ENCOUNTER — Encounter: Admitting: Psychology

## 2024-05-04 ENCOUNTER — Other Ambulatory Visit: Payer: Self-pay | Admitting: Neurology

## 2024-05-21 ENCOUNTER — Telehealth (HOSPITAL_COMMUNITY): Payer: Self-pay

## 2024-05-21 NOTE — Telephone Encounter (Signed)
 Called to confirm/remind patient of their appointment at the [facility] on 05/23/24.   Appointment:   [] Confirmed  [x] Left mess   [] No answer/No voice mail  [] VM Full/unable to leave message  [] Phone not in service  And to bring in all medications and/or complete list.

## 2024-05-23 ENCOUNTER — Ambulatory Visit (HOSPITAL_COMMUNITY)

## 2024-05-30 ENCOUNTER — Ambulatory Visit: Admitting: Neurology

## 2024-07-04 ENCOUNTER — Ambulatory Visit (HOSPITAL_COMMUNITY)

## 2024-07-04 ENCOUNTER — Ambulatory Visit: Admitting: Cardiovascular Disease

## 2024-09-12 ENCOUNTER — Ambulatory Visit: Admitting: Neurology
